# Patient Record
Sex: Male | Born: 1945 | Race: Black or African American | Hispanic: No | Marital: Single | Smoking: Current every day smoker
Health system: Southern US, Community
[De-identification: ages and names within clinical notes are randomized; demographics above are authoritative.]

## PROBLEM LIST (undated history)

## (undated) DIAGNOSIS — I714 Abdominal aortic aneurysm, without rupture, unspecified: Secondary | ICD-10-CM

## (undated) DIAGNOSIS — R7881 Bacteremia: Secondary | ICD-10-CM

## (undated) DIAGNOSIS — I10 Essential (primary) hypertension: Secondary | ICD-10-CM

## (undated) DIAGNOSIS — E7800 Pure hypercholesterolemia, unspecified: Secondary | ICD-10-CM

## (undated) DIAGNOSIS — J9819 Other pulmonary collapse: Secondary | ICD-10-CM

## (undated) HISTORY — PX: PLEURAL SCARIFICATION: SHX748

## (undated) HISTORY — PX: HERNIA REPAIR: SHX51

---

## 2004-10-19 ENCOUNTER — Inpatient Hospital Stay (HOSPITAL_COMMUNITY): Admission: EM | Admit: 2004-10-19 | Discharge: 2004-10-26 | Payer: Self-pay | Admitting: Emergency Medicine

## 2004-10-21 ENCOUNTER — Ambulatory Visit: Payer: Self-pay | Admitting: Internal Medicine

## 2004-10-25 ENCOUNTER — Encounter: Payer: Self-pay | Admitting: Cardiology

## 2004-10-25 ENCOUNTER — Ambulatory Visit: Payer: Self-pay | Admitting: Cardiology

## 2009-06-03 ENCOUNTER — Emergency Department (HOSPITAL_COMMUNITY): Admission: EM | Admit: 2009-06-03 | Discharge: 2009-06-03 | Payer: Self-pay | Admitting: Emergency Medicine

## 2010-05-20 ENCOUNTER — Encounter (HOSPITAL_COMMUNITY): Admission: RE | Admit: 2010-05-20 | Payer: Self-pay | Source: Home / Self Care | Admitting: Oncology

## 2010-05-20 ENCOUNTER — Ambulatory Visit (HOSPITAL_COMMUNITY): Payer: Self-pay | Admitting: Oncology

## 2010-06-23 ENCOUNTER — Encounter: Payer: Self-pay | Admitting: Family Medicine

## 2010-10-18 NOTE — Op Note (Signed)
NAME:  Charles [name], Charles [name] NO.:  [id number]   MEDICAL RECORD NO.:  [id number]          PATIENT TYPE:  INP   LOCATION:  5018                         FACILITY:  [facility]   PHYSICIAN:  Almedia Balls. Ranell Patrick, M.D. DATE OF BIRTH:  08-07-1945   DATE OF PROCEDURE:  10/21/2004  DATE OF DISCHARGE:                                 OPERATIVE REPORT   PREOPERATIVE DIAGNOSIS:  Right grade 2 open tib/fib fracture, segmental and  comminuted.   POSTOPERATIVE DIAGNOSIS:  Right grade 2 open tib/fib fracture, segmental and  comminuted.   PROCEDURE PERFORMED:  Second look irrigation and debridement of right open  tib/fib fracture with application of long leg cast under fluoroscopy.   SURGEON:  Almedia Balls. Ranell Patrick, M.D.   ASSISTANT:  None.   ANESTHESIA:  General.   ESTIMATED BLOOD LOSS:  Less than 100 mL.   FLUIDS REPLACED:  800 mL crystalloid.   COUNTS:  Correct.   COMPLICATIONS:  None.   INDICATIONS FOR PROCEDURE:  The patient is a 65 year old male who sustained  a grade 2 open tibia and fibular fracture to his right leg while riding a  horse.  The horse came up against a large pole.  The patient complained of  immediate pain and deformity with an obvious open tibial fracture and  presented to the emergency room with radiographs demonstrating a comminuted  segmental tib/fib fracture with a large 10 cm proximal laceration.  The  patient presented initially for primary irrigation, debridement, and  application of long leg splint.  The patient now comes back today for a  second look 48 hour wash out of the wound, delayed primary closure, and  fluoroscopic reduction of the fracture and application of a long leg cast.  Informed consent was obtained.   DESCRIPTION OF PROCEDURE:  After an adequate level of anesthesia was  achieved, the patient was positioned supine on the radiolucent table.  The  leg was sterilely prepped and draped in the usual manner.  We again  thoroughly irrigated and  debrided the 10 cm proximal laceration to the  medial side of the tibial tubercle.  We did that with a pulse irrigator with  3 liters normal saline irrigation.  The wound looked clean.  There was no  signs of foreign material in the wound.  At this point, we loosely closed in  a delayed primary fashion the wound using a single layer of vertical  mattress in near-far and far-near nylon suture.  At this point, having the  wound closed, we placed a sterile dressing and then applied a long leg cast  in stages.  This was a fiberglass cast.  Initially crossing the knee, the  fracture had to be in full extension in order to maintain a reduction.  When  we tried to flex it, the fracture would displace with a flexion deformity at  the fracture site.  Thus, the knee was nearly in full extension when we did  this reduction and applied the initial cast.  Once the fracture position was  verified in the cast, we completed the long leg cast reimaging afterwards  and it looked excellent with alignment on the AP and lateral planes looking  close to  anatomic.  At this point, we marked fluoroscopically the future window for  the wound inspection and then left the operating theatre having awakened him  from general anesthesia, he was in stable condition and taken to the  recovery room.      SRN/MEDQ  D:  10/21/2004  T:  10/21/2004  Job:  [id number]

## 2010-10-18 NOTE — Discharge Summary (Signed)
NAME:  Charles [name], Charles [name] NO.:  [id number]   MEDICAL RECORD NO.:  [id number]          PATIENT TYPE:  INP   LOCATION:  5018                         FACILITY:  [facility]   PHYSICIAN:  Almedia Balls. Ranell Patrick, M.D. DATE OF BIRTH:  [birth year]   DATE OF ADMISSION:  10/19/2004  DATE OF DISCHARGE:  10/26/2004                                 DISCHARGE SUMMARY   ADMITTING DIAGNOSIS:  Grade 2 open right tibia and fibula fracture.   DISCHARGE DIAGNOSES:  1.  Grade 2 open right tibia and fibula fracture.  2.  Atrial fibrillation, new onset.   PROCEDURES PERFORMED:  1.  Irrigation and debridement of grade 2 open tibiofibular fracture with      closed reduction under C-arm and application of long leg splint,      performed on Oct 19, 2004.  2.  Oct 21, 2004:  Second-look I&D of open tibiofibular fracture with      delayed primary closure of 10 cm wound and fluoroscopic reduction of the      fracture, application of long leg cast, again by Dr. Ranell Patrick.   HISTORY OF PRESENT ILLNESS:  The patient is a 65 year old male who sustained  a grade 2 open tibiofibular fracture on his right leg secondary to horse  injury when he was pinned between a pole and the horse. The patient  presented to the emergency room with an obvious open fracture with exposed  bone. For further details of the patient's past medical history and physical  examination, please see the medical record.   HOSPITAL COURSE:  The patient admitted to orthopedics and taken to surgery  urgently on Oct 19, 2004 for I&D of his open tibiofibular fracture and  placement in a splint. The patient was placed immediately on broad spectrum  IV antibiotics. He was then taken back to surgery on Oct 21, 2004 for a  second-look I&D, delayed primary closure of his open wound, and application  under fluoroscopic of full long leg cast. The patient postoperatively was  noted to have a significant anemia with a hemoglobin of 6.5 which  necessitated  an internal medicine consult. The patient also was noted to  have postoperative atrial fibrillation which was also followed by medicine.  The patient was felt not to be a chronic anticoagulation candidate by  internal medicine, was requested to follow up with the primary care  physician at [facility]. The patient was discharged on Oct 26, 2004 in  stable condition with scheduled follow-up with internal medicine in  [place] as well as orthopedics in [place]. He was discharged in a long  leg cast, nonweightbearing on his right lower extremity, with pain  medications, on a regular diet.       SRN/MEDQ  D:  12/31/2004  T:  01/01/2005  Job:  [id number]

## 2012-09-12 ENCOUNTER — Emergency Department (HOSPITAL_COMMUNITY): Payer: Medicare Other

## 2012-09-12 ENCOUNTER — Emergency Department (HOSPITAL_COMMUNITY)
Admission: EM | Admit: 2012-09-12 | Discharge: 2012-09-12 | Disposition: A | Discharge status: Home / Self Care | Payer: Medicare Other | Attending: Emergency Medicine | Admitting: Emergency Medicine

## 2012-09-12 ENCOUNTER — Encounter (HOSPITAL_COMMUNITY): Payer: Self-pay | Admitting: *Deleted

## 2012-09-12 DIAGNOSIS — W19XXXA Unspecified fall, initial encounter: Secondary | ICD-10-CM

## 2012-09-12 DIAGNOSIS — IMO0002 Reserved for concepts with insufficient information to code with codable children: Secondary | ICD-10-CM | POA: Insufficient documentation

## 2012-09-12 DIAGNOSIS — S3981XA Other specified injuries of abdomen, initial encounter: Secondary | ICD-10-CM | POA: Insufficient documentation

## 2012-09-12 DIAGNOSIS — F17200 Nicotine dependence, unspecified, uncomplicated: Secondary | ICD-10-CM | POA: Insufficient documentation

## 2012-09-12 DIAGNOSIS — M549 Dorsalgia, unspecified: Secondary | ICD-10-CM

## 2012-09-12 DIAGNOSIS — Y9301 Activity, walking, marching and hiking: Secondary | ICD-10-CM | POA: Insufficient documentation

## 2012-09-12 DIAGNOSIS — Y9289 Other specified places as the place of occurrence of the external cause: Secondary | ICD-10-CM | POA: Insufficient documentation

## 2012-09-12 DIAGNOSIS — Z862 Personal history of diseases of the blood and blood-forming organs and certain disorders involving the immune mechanism: Secondary | ICD-10-CM | POA: Insufficient documentation

## 2012-09-12 DIAGNOSIS — Z8639 Personal history of other endocrine, nutritional and metabolic disease: Secondary | ICD-10-CM | POA: Insufficient documentation

## 2012-09-12 DIAGNOSIS — W108XXA Fall (on) (from) other stairs and steps, initial encounter: Secondary | ICD-10-CM | POA: Insufficient documentation

## 2012-09-12 HISTORY — DX: Pure hypercholesterolemia, unspecified: E78.00

## 2012-09-12 MED ORDER — OXYCODONE-ACETAMINOPHEN 5-325 MG PO TABS
1.0000 | ORAL_TABLET | Freq: Once | ORAL | Status: AC
  Administered 2012-09-12: 1 via ORAL
  Filled 2012-09-12: qty 1

## 2012-09-12 MED ORDER — OXYCODONE-ACETAMINOPHEN 5-325 MG PO TABS: 1.0000 | ORAL_TABLET | ORAL | Status: DC | PRN

## 2012-09-12 NOTE — ED Notes (Signed)
Pt states he fell 3 days ago, down 2-3 steps. Pain to lower back and abdomen. NAD.

## 2012-09-12 NOTE — ED Provider Notes (Signed)
History  This chart was scribed for Joya Gaskins, MD, by Candelaria Stagers, ED Scribe. This patient was seen in room APA18/APA18 and the patient's care was started at 8:05 AM   CSN: [id number]  Arrival date & time 09/12/12  0745   First MD Initiated Contact with Patient 09/12/12 0756      Chief Complaint  Patient presents with  . Fall     Patient is a 67 y.o. male presenting with fall. The history is provided by the patient. No language interpreter was used.  Fall The accident occurred more than 2 days ago. The fall occurred while walking. He landed on concrete. There was no blood loss. The point of impact was the head (lower back). Pain location: lumbar spine and lower abdomen. He was ambulatory at the scene. There was no entrapment after the fall. There was no drug use involved in the accident. There was no alcohol use involved in the accident. Associated symptoms include abdominal pain. Pertinent negatives include no numbness, no nausea, no vomiting and no headaches. He has tried NSAIDs for the symptoms. The treatment provided mild relief.   Charles [name] is a 67 y.o. male who presents to the Emergency Department complaining of continued abdominal pain and lower back pain after tripping and falling down 2-3 stairs one week ago falling backwards landing on cement.  Pt reports he held his head and did not hit it but did land on his back and he denies syncope.  He reports minimal pain immediately after the fall and was ambulatory going to work right after the fall.  He has taken ibuprofen with some relief and states that now it is helping less.  He denies headache, neck pain, or leg weakness.  Pt does not take anticoagulants.     Past Medical History  Diagnosis Date  . Hypercholesteremia     Past Surgical History  Procedure Laterality Date  . Hernia repair      No family history on file.  History  Substance Use Topics  . Smoking status: Current Every Day Smoker  .  Smokeless tobacco: Not on file  . Alcohol Use: Yes      Review of Systems  Gastrointestinal: Positive for abdominal pain. Negative for nausea and vomiting.  Neurological: Negative for numbness and headaches.  All other systems reviewed and are negative.    Allergies  Review of patient's allergies indicates no known allergies.  Home Medications  No current outpatient prescriptions on file.  BP 156/73  Pulse 47  Temp(Src) 98.3 F (36.8 C) (Oral)  Resp 16  Ht 5\' 4"  (1.626 m)  Wt 130 lb (58.968 kg)  BMI 22.3 kg/m2  SpO2 98%  Physical Exam CONSTITUTIONAL: Well developed/well nourished HEAD: Normocephalic/atraumatic EYES: EOMI/PERRL ENMT: Mucous membranes moist, no facial trauma, poor dentition NECK: supple no meningeal signs SPINE: lower lumbar tenderness, No bruising/crepitance/stepoffs noted to spine, NEXUS criteria met CV: S1/S2 noted, no murmurs/rubs/gallops noted LUNGS: Lungs are clear to auscultation bilaterally, no apparent distress CHEST: no chest wall tenderness ABDOMEN: soft, nontender, no rebound or guarding, no bruising noted to his abdomen GU:no cva tenderness NEURO: Pt is awake/alert, moves all extremitiesx4 EXTREMITIES: pulses normal, full ROM, no deformity, mild tenderness to right hip, pelvis stable and he has full ROM of both hips, All other extremities/joints palpated/ranged and nontender SKIN: warm, color normal PSYCH: no abnormalities of mood noted  ED Course  Procedures   DIAGNOSTIC STUDIES: Oxygen Saturation is 98% on room air, normal by my  interpretation.    COORDINATION OF CARE:  8:10 AM Discussed course of care with pt which includes xray of lumbar spine and hip.  Pt understands and agrees.   9:27 AM Pt improved He is ambulatory without issue I doubt occult head/chest/abdominal injury from fall given it was over a week ago Denies syncope, reports mechanical fall Stable for d/c  Labs Reviewed - No data to display Dg Lumbar Spine  Complete  09/12/2012  *RADIOLOGY REPORT*  Clinical Data: Fall 1 week ago, persistent low back pain.  LUMBAR SPINE - COMPLETE 4+ VIEW  Comparison: None.  Findings: Five non-rib bearing lumbar vertebrae with anatomic alignment.  Straightening of the usual lordosis.  No fractures. Disc space narrowing endplate hypertrophic changes at L4-5, moderate in degree.  Remaining disc spaces well preserved.  No pars defects.  Facet degenerative changes diffusely, worst at L4-5 and five S1.  Visualized sacroiliac joints intact.  Calcified aorto- iliac plaque without evidence of aneurysm.  IMPRESSION: No acute osseous abnormality.  Straightening of the usual lordosis which may be due to positioning and/or spasm.  Moderate degenerative disc disease at L4-5.   Original Report Authenticated By: Hulan Saas, M.D.    Dg Pelvis 1-2 Views  09/12/2012  *RADIOLOGY REPORT*  Clinical Data: Larey Seat 1 week ago, persistent pain.  PELVIS - 1-2 VIEW  Comparison: None.  Findings: No acute or subacute fractures involving the pelvis. Sacroiliac joints symphysis pubis intact.  Moderate to severe symmetric joint space narrowing in both hips.  Calcified iliofemoral plaque without aneurysm.  IMPRESSION: No acute or subacute osseous abnormality.  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   Original Report Authenticated By: Hulan Saas, M.D.        MDM  Nursing notes including past medical history and social history reviewed and considered in documentation xrays reviewed and considered   I personally performed the services described in this documentation, which was scribed in my presence. The recorded information has been reviewed and is accurate.          Joya Gaskins, MD 09/12/12 [phone]

## 2012-09-14 ENCOUNTER — Emergency Department (HOSPITAL_COMMUNITY)
Admission: EM | Admit: 2012-09-14 | Discharge: 2012-09-14 | Discharge status: Against Medical Advice | Payer: Medicare Other | Attending: Emergency Medicine | Admitting: Emergency Medicine

## 2012-09-14 ENCOUNTER — Encounter (HOSPITAL_COMMUNITY): Payer: Self-pay | Admitting: *Deleted

## 2012-09-14 DIAGNOSIS — Z760 Encounter for issue of repeat prescription: Secondary | ICD-10-CM | POA: Insufficient documentation

## 2012-09-14 DIAGNOSIS — Z87828 Personal history of other (healed) physical injury and trauma: Secondary | ICD-10-CM | POA: Insufficient documentation

## 2012-09-14 DIAGNOSIS — F17200 Nicotine dependence, unspecified, uncomplicated: Secondary | ICD-10-CM | POA: Insufficient documentation

## 2012-09-14 NOTE — ED Notes (Signed)
Pt left stating he does not want to wait to see edp for rx refill.  States, " I just want my medicine refilled.  I'll go see my other doctor."

## 2012-09-14 NOTE — ED Notes (Signed)
Pt was seen in er 09/12/2012 for fall, diagnosed with back pain, was given pain prescription, states that he has ran out of pain medication, presents to er today for refill, states that the back pain is getting better,

## 2012-09-15 ENCOUNTER — Emergency Department (HOSPITAL_COMMUNITY)
Admission: EM | Admit: 2012-09-15 | Discharge: 2012-09-15 | Disposition: A | Discharge status: Home / Self Care | Payer: Medicare Other | Attending: Emergency Medicine | Admitting: Emergency Medicine

## 2012-09-15 ENCOUNTER — Encounter (HOSPITAL_COMMUNITY): Payer: Self-pay

## 2012-09-15 DIAGNOSIS — M549 Dorsalgia, unspecified: Secondary | ICD-10-CM

## 2012-09-15 DIAGNOSIS — M545 Low back pain, unspecified: Secondary | ICD-10-CM | POA: Insufficient documentation

## 2012-09-15 DIAGNOSIS — F17200 Nicotine dependence, unspecified, uncomplicated: Secondary | ICD-10-CM | POA: Insufficient documentation

## 2012-09-15 DIAGNOSIS — Z862 Personal history of diseases of the blood and blood-forming organs and certain disorders involving the immune mechanism: Secondary | ICD-10-CM | POA: Insufficient documentation

## 2012-09-15 DIAGNOSIS — Z8639 Personal history of other endocrine, nutritional and metabolic disease: Secondary | ICD-10-CM | POA: Insufficient documentation

## 2012-09-15 DIAGNOSIS — Z87828 Personal history of other (healed) physical injury and trauma: Secondary | ICD-10-CM | POA: Insufficient documentation

## 2012-09-15 MED ORDER — OXYCODONE-ACETAMINOPHEN 5-325 MG PO TABS
1.0000 | ORAL_TABLET | Freq: Once | ORAL | Status: AC
  Administered 2012-09-15: 1 via ORAL
  Filled 2012-09-15: qty 1

## 2012-09-15 MED ORDER — OXYCODONE-ACETAMINOPHEN 5-325 MG PO TABS: 1.0000 | ORAL_TABLET | Freq: Four times a day (QID) | ORAL | Status: DC | PRN

## 2012-09-15 NOTE — ED Provider Notes (Signed)
History    This chart was scribed for Charles Lennert, MD by Marlyne Beards, ED Scribe. The patient was seen in room APFT23/APFT23. Patient's care was started at 3:27 PM.    CSN: [id number]  Arrival date & time 09/15/12  1501   First MD Initiated Contact with Patient 09/15/12 1527      Chief Complaint  Patient presents with  . Back Pain    (Consider location/radiation/quality/duration/timing/severity/associated sxs/prior treatment) Patient is a 67 y.o. male presenting with back pain. The history is provided by the patient. No language interpreter was used.  Back Pain Location:  Lumbar spine Pain severity:  Moderate Duration:  4 weeks Timing:  Constant Progression:  Worsening Context: falling   Relieved by: oxycodone. Associated symptoms: no abdominal pain, no chest pain and no headaches    Charles [name] is a 67 y.o. male who presents to the Emergency Department complaining of moderate constant lower back pain for the past 3-4 weeks. Pt was seen in the ED last Sunday and was prescribed oxycodone for the pain. Pt denies that pain radiates but pain is exacerbated with movement. Pt states he had a previous injury of falling on the floor landing on his back. Pt denies any bladder/bowel incontinence, fever, chills, cough, nausea, vomiting, diarrhea, SOB, weakness, and any other associated symptoms. Pt's PCP is Dr. Phillips Odor. Pt is a current everyday tobacco smoker.   Past Medical History  Diagnosis Date  . Hypercholesteremia     Past Surgical History  Procedure Laterality Date  . Hernia repair      No family history on file.  History  Substance Use Topics  . Smoking status: Current Every Day Smoker  . Smokeless tobacco: Not on file  . Alcohol Use: Yes     Comment: former      Review of Systems  Constitutional: Negative for appetite change and fatigue.  HENT: Negative for congestion, sinus pressure and ear discharge.   Eyes: Negative for discharge.  Respiratory:  Negative for cough.   Cardiovascular: Negative for chest pain.  Gastrointestinal: Negative for abdominal pain and diarrhea.  Genitourinary: Negative for frequency and hematuria.  Musculoskeletal: Positive for back pain.  Skin: Negative for rash.  Neurological: Negative for seizures and headaches.  Psychiatric/Behavioral: Negative for hallucinations.    Allergies  Review of patient's allergies indicates no known allergies.  Home Medications   Current Outpatient Rx  Name  Route  Sig  Dispense  Refill  . ibuprofen (ADVIL,MOTRIN) 200 MG tablet   Oral   Take 800 mg by mouth every 6 (six) hours as needed for pain.         Marland Kitchen oxyCODONE-acetaminophen (PERCOCET/ROXICET) 5-325 MG per tablet   Oral   Take 1 tablet by mouth every 4 (four) hours as needed for pain.   10 tablet   0     BP 152/80  Pulse 96  Temp(Src) 97.1 F (36.2 C) (Oral)  Resp 18  Ht 5\' 4"  (1.626 m)  Wt 130 lb (58.968 kg)  BMI 22.3 kg/m2  SpO2 100%  Physical Exam  Nursing note and vitals reviewed. Constitutional: He is oriented to person, place, and time. He appears well-developed.  HENT:  Head: Normocephalic.  Eyes: Conjunctivae and EOM are normal. No scleral icterus.  Neck: Neck supple. No tracheal deviation present. No thyromegaly present.  Cardiovascular: Normal rate and regular rhythm.  Exam reveals no gallop and no friction rub.   No murmur heard. Pulmonary/Chest: No stridor. He has no wheezes. He  has no rales. He exhibits no tenderness.  Abdominal: He exhibits no distension. There is no tenderness. There is no rebound.  Musculoskeletal: Normal range of motion. He exhibits tenderness. He exhibits no edema.  Tender upon palpation to the lumbar spine region mildly.  Lymphadenopathy:    He has no cervical adenopathy.  Neurological: He is oriented to person, place, and time. Coordination normal.  Skin: Skin is warm. No rash noted. No erythema.  Psychiatric: He has a normal mood and affect. His behavior  is normal.    ED Course  Procedures (including critical care time) DIAGNOSTIC STUDIES: Oxygen Saturation is 100% on room air, normal by my interpretation.    COORDINATION OF CARE: 3:33 PM Discussed ED treatment with pt and pt agrees.     Labs Reviewed - No data to display No results found.   No diagnosis found.    MDM   The chart was scribed for me under my direct supervision.  I personally performed the history, physical, and medical decision making and all procedures in the evaluation of this patient.Charles Lennert, MD 09/15/12 [phone]

## 2012-09-15 NOTE — ED Notes (Signed)
Pt c/o lower back pain for past 3 or 4 weeks.  Reports was evaluated here last Sunday and  has been taking oxycodone for pain.

## 2012-09-16 ENCOUNTER — Encounter (HOSPITAL_COMMUNITY): Payer: Self-pay | Admitting: *Deleted

## 2012-09-16 ENCOUNTER — Emergency Department (HOSPITAL_COMMUNITY): Payer: Medicare Other

## 2012-09-16 ENCOUNTER — Emergency Department (HOSPITAL_COMMUNITY)
Admission: EM | Admit: 2012-09-16 | Discharge: 2012-09-16 | Disposition: A | Discharge status: Home / Self Care | Payer: Medicare Other | Attending: Emergency Medicine | Admitting: Emergency Medicine

## 2012-09-16 DIAGNOSIS — M79652 Pain in left thigh: Secondary | ICD-10-CM

## 2012-09-16 DIAGNOSIS — Z8639 Personal history of other endocrine, nutritional and metabolic disease: Secondary | ICD-10-CM | POA: Insufficient documentation

## 2012-09-16 DIAGNOSIS — M79609 Pain in unspecified limb: Secondary | ICD-10-CM | POA: Insufficient documentation

## 2012-09-16 DIAGNOSIS — Z862 Personal history of diseases of the blood and blood-forming organs and certain disorders involving the immune mechanism: Secondary | ICD-10-CM | POA: Insufficient documentation

## 2012-09-16 DIAGNOSIS — F17200 Nicotine dependence, unspecified, uncomplicated: Secondary | ICD-10-CM | POA: Insufficient documentation

## 2012-09-16 DIAGNOSIS — G8911 Acute pain due to trauma: Secondary | ICD-10-CM | POA: Insufficient documentation

## 2012-09-16 NOTE — ED Provider Notes (Signed)
History    This chart was scribed for Donnetta Hutching, MD by Donne Anon, ED Scribe. This patient was seen in room APA06/APA06 and the patient's care was started at 1507.   CSN: [id number]  Arrival date & time 09/16/12  1303   First MD Initiated Contact with Patient 09/16/12 1507      Chief Complaint  Patient presents with  . Groin Pain   Level 5 caveat for mental health issues  The history is provided by the patient. The history is limited by the condition of the patient. No language interpreter was used.   Charles [name] is a 67 y.o. male who presents to the Emergency Department complaining of gradual onset, moderate medial aspect of left thigh pain which began 5 weeks ago. He was seen in the ED yesterday and was prescribed Percocet because he hurt his back. He denies any recent injury to his left leg. He currently lives with his nephew. He states the pain is so bad he cannot walk.   Past Medical History  Diagnosis Date  . Hypercholesteremia     Past Surgical History  Procedure Laterality Date  . Hernia repair    . Pleural scarification      History reviewed. No pertinent family history.  History  Substance Use Topics  . Smoking status: Current Every Day Smoker    Types: Cigarettes  . Smokeless tobacco: Not on file  . Alcohol Use: Yes     Comment: former      Review of Systems  Unable to perform ROS: Psychiatric disorder    Allergies  Review of patient's allergies indicates no known allergies.  Home Medications   Current Outpatient Rx  Name  Route  Sig  Dispense  Refill  . oxyCODONE-acetaminophen (PERCOCET/ROXICET) 5-325 MG per tablet   Oral   Take 1 tablet by mouth every 6 (six) hours as needed for pain.   30 tablet   0     BP 159/92  Pulse 109  Temp(Src) 100.4 F (38 C) (Oral)  Resp 20  Ht 5\' 4"  (1.626 m)  Wt 130 lb (58.968 kg)  BMI 22.3 kg/m2  SpO2 100%  Physical Exam  Nursing note and vitals reviewed. Constitutional: He appears  well-developed and well-nourished.  HENT:  Head: Normocephalic and atraumatic.  Eyes: Conjunctivae and EOM are normal. Pupils are equal, round, and reactive to light.  Neck: Normal range of motion. Neck supple.  Cardiovascular: Normal rate, regular rhythm and normal heart sounds.   Pulmonary/Chest: Effort normal and breath sounds normal.  Abdominal: Soft. Bowel sounds are normal.  Musculoskeletal:  Its proximal medial thigh.  No posterior thigh or calf tenderness  Neurological: He is alert.  Skin: Skin is warm and dry.  Psychiatric: He has a normal mood and affect.    ED Course  Procedures (including critical care time) DIAGNOSTIC STUDIES: Oxygen Saturation is 100% on room air, normal by my interpretation.    COORDINATION OF CARE: 3:32 PM Discussed treatment plan which includes reviewing the pts chart and an xray of the left thigh with pt at bedside and pt agreed to plan.    Dg Femur Left  09/16/2012  *RADIOLOGY REPORT*  Clinical Data: Left groin pain after falling several weeks ago.  LEFT FEMUR - 2 VIEW  Comparison: One-view pelvis 09/12/2012.  Findings: The mineralization and alignment are normal.  There is no evidence of acute fracture or dislocation.  There is mild left hip and moderate left knee joint space loss.  Diffuse vascular calcifications are noted.  IMPRESSION: No acute osseous findings identified.  Diffuse vascular calcifications are noted.   Original Report Authenticated By: Carey Bullocks, M.D.     Labs Reviewed - No data to display No results found.   No diagnosis found.    MDM  No posterior calf or thigh tenderness to suggest DVT. Physical exam of the affected extremity normal.  X-ray negative of left femur  I personally performed the services described in this documentation, which was scribed in my presence. The recorded information has been reviewed and is accurate.        Donnetta Hutching, MD 09/16/12 [phone]

## 2012-09-16 NOTE — ED Notes (Addendum)
Pain lt groin,   Seen here 4/13 for same. Says the meds he was given do not help  Says pain goes down his lt leg.  Decreased urination.  No N/V,    Says he is constipated

## 2012-09-21 ENCOUNTER — Inpatient Hospital Stay (HOSPITAL_COMMUNITY)
Admission: EM | Admit: 2012-09-21 | Discharge: 2012-10-08 | DRG: 237 | Disposition: A | Discharge status: Other Acute Inpatient Hospital | Payer: Medicare Other | Attending: Vascular Surgery | Admitting: Vascular Surgery

## 2012-09-21 ENCOUNTER — Inpatient Hospital Stay (HOSPITAL_COMMUNITY): Payer: Medicare Other

## 2012-09-21 ENCOUNTER — Inpatient Hospital Stay (HOSPITAL_COMMUNITY): Payer: Medicare Other | Admitting: Anesthesiology

## 2012-09-21 ENCOUNTER — Emergency Department (HOSPITAL_COMMUNITY): Payer: Medicare Other

## 2012-09-21 ENCOUNTER — Encounter (HOSPITAL_COMMUNITY): Payer: Self-pay | Admitting: Anesthesiology

## 2012-09-21 ENCOUNTER — Encounter (HOSPITAL_COMMUNITY)
Admission: EM | Disposition: A | Discharge status: Other Acute Inpatient Hospital | Payer: Self-pay | Source: Home / Self Care | Attending: Vascular Surgery

## 2012-09-21 ENCOUNTER — Encounter (HOSPITAL_COMMUNITY): Payer: Self-pay | Admitting: Emergency Medicine

## 2012-09-21 DIAGNOSIS — T8119XA Other postprocedural shock, initial encounter: Secondary | ICD-10-CM | POA: Diagnosis not present

## 2012-09-21 DIAGNOSIS — A419 Sepsis, unspecified organism: Secondary | ICD-10-CM | POA: Diagnosis not present

## 2012-09-21 DIAGNOSIS — Z681 Body mass index (BMI) 19 or less, adult: Secondary | ICD-10-CM

## 2012-09-21 DIAGNOSIS — D62 Acute posthemorrhagic anemia: Secondary | ICD-10-CM | POA: Diagnosis not present

## 2012-09-21 DIAGNOSIS — IMO0002 Reserved for concepts with insufficient information to code with codable children: Secondary | ICD-10-CM | POA: Diagnosis not present

## 2012-09-21 DIAGNOSIS — A029 Salmonella infection, unspecified: Secondary | ICD-10-CM | POA: Diagnosis not present

## 2012-09-21 DIAGNOSIS — D696 Thrombocytopenia, unspecified: Secondary | ICD-10-CM | POA: Diagnosis not present

## 2012-09-21 DIAGNOSIS — R410 Disorientation, unspecified: Secondary | ICD-10-CM

## 2012-09-21 DIAGNOSIS — R17 Unspecified jaundice: Secondary | ICD-10-CM | POA: Diagnosis not present

## 2012-09-21 DIAGNOSIS — I959 Hypotension, unspecified: Secondary | ICD-10-CM | POA: Diagnosis not present

## 2012-09-21 DIAGNOSIS — J9600 Acute respiratory failure, unspecified whether with hypoxia or hypercapnia: Secondary | ICD-10-CM | POA: Diagnosis not present

## 2012-09-21 DIAGNOSIS — K929 Disease of digestive system, unspecified: Secondary | ICD-10-CM | POA: Diagnosis not present

## 2012-09-21 DIAGNOSIS — R7309 Other abnormal glucose: Secondary | ICD-10-CM | POA: Diagnosis not present

## 2012-09-21 DIAGNOSIS — I4891 Unspecified atrial fibrillation: Secondary | ICD-10-CM | POA: Diagnosis present

## 2012-09-21 DIAGNOSIS — J438 Other emphysema: Secondary | ICD-10-CM | POA: Diagnosis present

## 2012-09-21 DIAGNOSIS — E860 Dehydration: Secondary | ICD-10-CM | POA: Diagnosis present

## 2012-09-21 DIAGNOSIS — Z9911 Dependence on respirator [ventilator] status: Secondary | ICD-10-CM

## 2012-09-21 DIAGNOSIS — I713 Abdominal aortic aneurysm, ruptured, unspecified: Principal | ICD-10-CM | POA: Diagnosis present

## 2012-09-21 DIAGNOSIS — E876 Hypokalemia: Secondary | ICD-10-CM | POA: Diagnosis not present

## 2012-09-21 DIAGNOSIS — B9689 Other specified bacterial agents as the cause of diseases classified elsewhere: Secondary | ICD-10-CM | POA: Diagnosis not present

## 2012-09-21 DIAGNOSIS — J9589 Other postprocedural complications and disorders of respiratory system, not elsewhere classified: Secondary | ICD-10-CM | POA: Diagnosis not present

## 2012-09-21 DIAGNOSIS — E7800 Pure hypercholesterolemia, unspecified: Secondary | ICD-10-CM | POA: Diagnosis present

## 2012-09-21 DIAGNOSIS — D72829 Elevated white blood cell count, unspecified: Secondary | ICD-10-CM | POA: Diagnosis not present

## 2012-09-21 DIAGNOSIS — F1010 Alcohol abuse, uncomplicated: Secondary | ICD-10-CM | POA: Diagnosis present

## 2012-09-21 DIAGNOSIS — E873 Alkalosis: Secondary | ICD-10-CM | POA: Diagnosis not present

## 2012-09-21 DIAGNOSIS — R531 Weakness: Secondary | ICD-10-CM | POA: Diagnosis present

## 2012-09-21 DIAGNOSIS — R6521 Severe sepsis with septic shock: Secondary | ICD-10-CM | POA: Diagnosis not present

## 2012-09-21 DIAGNOSIS — J95821 Acute postprocedural respiratory failure: Secondary | ICD-10-CM | POA: Diagnosis not present

## 2012-09-21 DIAGNOSIS — E871 Hypo-osmolality and hyponatremia: Secondary | ICD-10-CM

## 2012-09-21 DIAGNOSIS — D689 Coagulation defect, unspecified: Secondary | ICD-10-CM | POA: Diagnosis not present

## 2012-09-21 DIAGNOSIS — N390 Urinary tract infection, site not specified: Secondary | ICD-10-CM | POA: Diagnosis not present

## 2012-09-21 DIAGNOSIS — R109 Unspecified abdominal pain: Secondary | ICD-10-CM

## 2012-09-21 DIAGNOSIS — K560 Paralytic ileus: Secondary | ICD-10-CM | POA: Diagnosis not present

## 2012-09-21 DIAGNOSIS — I119 Hypertensive heart disease without heart failure: Secondary | ICD-10-CM | POA: Diagnosis present

## 2012-09-21 DIAGNOSIS — R5383 Other fatigue: Secondary | ICD-10-CM

## 2012-09-21 DIAGNOSIS — R627 Adult failure to thrive: Secondary | ICD-10-CM | POA: Diagnosis present

## 2012-09-21 DIAGNOSIS — F22 Delusional disorders: Secondary | ICD-10-CM | POA: Diagnosis not present

## 2012-09-21 DIAGNOSIS — E43 Unspecified severe protein-calorie malnutrition: Secondary | ICD-10-CM | POA: Diagnosis present

## 2012-09-21 DIAGNOSIS — R5381 Other malaise: Secondary | ICD-10-CM | POA: Diagnosis present

## 2012-09-21 DIAGNOSIS — R7881 Bacteremia: Secondary | ICD-10-CM | POA: Diagnosis not present

## 2012-09-21 DIAGNOSIS — E8809 Other disorders of plasma-protein metabolism, not elsewhere classified: Secondary | ICD-10-CM

## 2012-09-21 DIAGNOSIS — F17200 Nicotine dependence, unspecified, uncomplicated: Secondary | ICD-10-CM | POA: Diagnosis present

## 2012-09-21 DIAGNOSIS — J95851 Ventilator associated pneumonia: Secondary | ICD-10-CM | POA: Diagnosis not present

## 2012-09-21 DIAGNOSIS — T827XXA Infection and inflammatory reaction due to other cardiac and vascular devices, implants and grafts, initial encounter: Secondary | ICD-10-CM

## 2012-09-21 DIAGNOSIS — I2789 Other specified pulmonary heart diseases: Secondary | ICD-10-CM | POA: Diagnosis present

## 2012-09-21 DIAGNOSIS — G9341 Metabolic encephalopathy: Secondary | ICD-10-CM | POA: Diagnosis not present

## 2012-09-21 DIAGNOSIS — F411 Generalized anxiety disorder: Secondary | ICD-10-CM | POA: Diagnosis not present

## 2012-09-21 HISTORY — PX: ABDOMINAL AORTIC ANEURYSM REPAIR: SHX42

## 2012-09-21 HISTORY — PX: EMBOLECTOMY: SHX44

## 2012-09-21 HISTORY — PX: PATCH ANGIOPLASTY: SHX6230

## 2012-09-21 HISTORY — DX: Other pulmonary collapse: J98.19

## 2012-09-21 LAB — URINALYSIS, ROUTINE W REFLEX MICROSCOPIC
Nitrite: NEGATIVE
Specific Gravity, Urine: 1.01 (ref 1.005–1.030)
Urobilinogen, UA: 4 mg/dL — ABNORMAL HIGH (ref 0.0–1.0)
pH: 6.5 (ref 5.0–8.0)

## 2012-09-21 LAB — CBC WITH DIFFERENTIAL/PLATELET
Basophils Relative: 0 % (ref 0–1)
Eosinophils Absolute: 0 10*3/uL (ref 0.0–0.7)
Hemoglobin: 11.3 g/dL — ABNORMAL LOW (ref 13.0–17.0)
Lymphs Abs: 0.9 10*3/uL (ref 0.7–4.0)
Platelets: 148 10*3/uL — ABNORMAL LOW (ref 150–400)
RDW: 12.2 % (ref 11.5–15.5)
WBC: 9 10*3/uL (ref 4.0–10.5)

## 2012-09-21 LAB — URINE MICROSCOPIC-ADD ON

## 2012-09-21 LAB — COMPREHENSIVE METABOLIC PANEL
AST: 75 U/L — ABNORMAL HIGH (ref 0–37)
Albumin: 2.4 g/dL — ABNORMAL LOW (ref 3.5–5.2)
Alkaline Phosphatase: 70 U/L (ref 39–117)
BUN: 15 mg/dL (ref 6–23)
CO2: 31 mEq/L (ref 19–32)
Chloride: 82 mEq/L — ABNORMAL LOW (ref 96–112)
Creatinine, Ser: 0.55 mg/dL (ref 0.50–1.35)
Creatinine, Ser: 0.44 mg/dL — ABNORMAL LOW (ref 0.50–1.35)
GFR calc non Af Amer: >90 mL/min (ref >90)
Potassium: 3.8 mEq/L (ref 3.5–5.1)
Potassium: 3.5 mEq/L (ref 3.5–5.1)
Sodium: 121 mEq/L — ABNORMAL LOW (ref 135–145)
Total Bilirubin: 0.9 mg/dL (ref 0.3–1.2)

## 2012-09-21 LAB — RAPID URINE DRUG SCREEN, HOSP PERFORMED
Cocaine: NOT DETECTED
Opiates: NOT DETECTED
Tetrahydrocannabinol: POSITIVE — AB

## 2012-09-21 LAB — CBC
MCV: 92.7 fL (ref 78.0–100.0)
Platelets: 150 10*3/uL (ref 150–400)
RDW: 12.1 % (ref 11.5–15.5)
WBC: 12.9 10*3/uL — ABNORMAL HIGH (ref 4.0–10.5)

## 2012-09-21 LAB — MRSA PCR SCREENING: MRSA by PCR: NEGATIVE

## 2012-09-21 SURGERY — ANEURYSM ABDOMINAL AORTIC REPAIR
Anesthesia: General | Site: Leg Lower | Laterality: Right

## 2012-09-21 MED ORDER — ACETAMINOPHEN 325 MG PO TABS
650.0000 mg | ORAL_TABLET | Freq: Four times a day (QID) | ORAL | Status: DC | PRN
  Administered 2012-09-21: 650 mg via ORAL
  Filled 2012-09-21: qty 2

## 2012-09-21 MED ORDER — SODIUM CHLORIDE 0.9 % IV SOLN
INTRAVENOUS | Status: DC
  Administered 2012-09-21 – 2012-09-22 (×7): via INTRAVENOUS

## 2012-09-21 MED ORDER — CEFAZOLIN SODIUM-DEXTROSE 2-3 GM-% IV SOLR
2.0000 g | INTRAVENOUS | Status: DC
  Filled 2012-09-21: qty 50

## 2012-09-21 MED ORDER — MORPHINE SULFATE 2 MG/ML IJ SOLN
2.0000 mg | Freq: Once | INTRAMUSCULAR | Status: AC
  Administered 2012-09-21: 2 mg via INTRAVENOUS
  Filled 2012-09-21: qty 1

## 2012-09-21 MED ORDER — DILTIAZEM HCL 100 MG IV SOLR
5.0000 mg/h | INTRAVENOUS | Status: DC
  Administered 2012-09-21: 10 mg/h via INTRAVENOUS
  Administered 2012-09-21: 5 mg/h via INTRAVENOUS
  Administered 2012-09-21: 15 mg/h via INTRAVENOUS
  Filled 2012-09-21: qty 100

## 2012-09-21 MED ORDER — HYDROCODONE-ACETAMINOPHEN 5-325 MG PO TABS
1.0000 | ORAL_TABLET | Freq: Four times a day (QID) | ORAL | Status: DC | PRN
  Administered 2012-09-21 (×2): 1 via ORAL
  Filled 2012-09-21 (×2): qty 1

## 2012-09-21 MED ORDER — IOHEXOL 300 MG/ML  SOLN
50.0000 mL | Freq: Once | INTRAMUSCULAR | Status: AC | PRN
  Administered 2012-09-21: 50 mL via ORAL

## 2012-09-21 MED ORDER — MORPHINE SULFATE 2 MG/ML IJ SOLN: 2.0000 mg | INTRAMUSCULAR | Status: DC | PRN

## 2012-09-21 MED ORDER — ALUM & MAG HYDROXIDE-SIMETH 200-200-20 MG/5ML PO SUSP: 30.0000 mL | Freq: Four times a day (QID) | ORAL | Status: DC | PRN

## 2012-09-21 MED ORDER — SODIUM CHLORIDE 0.9 % IJ SOLN: 3.0000 mL | Freq: Two times a day (BID) | INTRAMUSCULAR | Status: DC

## 2012-09-21 MED ORDER — CEFTRIAXONE SODIUM 1 G IJ SOLR
1.0000 g | INTRAMUSCULAR | Status: DC
  Administered 2012-09-21: 1 g via INTRAVENOUS
  Filled 2012-09-21 (×2): qty 10

## 2012-09-21 MED ORDER — IOHEXOL 300 MG/ML  SOLN
100.0000 mL | Freq: Once | INTRAMUSCULAR | Status: AC | PRN
  Administered 2012-09-21: 100 mL via INTRAVENOUS

## 2012-09-21 MED ORDER — ONDANSETRON HCL 4 MG PO TABS: 4.0000 mg | ORAL_TABLET | Freq: Four times a day (QID) | ORAL | Status: DC | PRN

## 2012-09-21 MED ORDER — ONDANSETRON HCL 4 MG/2ML IJ SOLN: 4.0000 mg | Freq: Four times a day (QID) | INTRAMUSCULAR | Status: DC | PRN

## 2012-09-21 SURGICAL SUPPLY — 74 items
ATTRACTOMAT 16X20 MAGNETIC DRP (DRAPES) ×4 IMPLANT
BAG ISL DRAPE 18X18 STRL (DRAPES) ×3
BAG ISOLATION DRAPE 18X18 (DRAPES) IMPLANT
CANISTER SUCTION 2500CC (MISCELLANEOUS) ×4 IMPLANT
CANISTER WOUND CARE 500ML ATS (WOUND CARE) ×2 IMPLANT
CANNULA VESSEL W/WING WO/VALVE (CANNULA) ×4 IMPLANT
CATH EMB 4FR 80CM (CATHETERS) ×1 IMPLANT
CLIP TI MEDIUM 24 (CLIP) ×4 IMPLANT
CLIP TI WIDE RED SMALL 24 (CLIP) ×4 IMPLANT
CLOTH BEACON ORANGE TIMEOUT ST (SAFETY) ×4 IMPLANT
COVER SURGICAL LIGHT HANDLE (MISCELLANEOUS) ×4 IMPLANT
DRAPE INCISE IOBAN 66X45 STRL (DRAPES) ×4 IMPLANT
DRAPE ISOLATION BAG 18X18 (DRAPES) ×1
DRAPE WARM FLUID 44X44 (DRAPE) ×4 IMPLANT
DRSG COVADERM 4X6 (GAUZE/BANDAGES/DRESSINGS) ×4 IMPLANT
ELECT BLADE 6.5 EXT (BLADE) IMPLANT
ELECT CAUTERY BLADE 6.4 (BLADE) ×2 IMPLANT
ELECT REM PT RETURN 9FT ADLT (ELECTROSURGICAL) ×4
ELECTRODE REM PT RTRN 9FT ADLT (ELECTROSURGICAL) ×3 IMPLANT
GLOVE BIO SURGEON STRL SZ7.5 (GLOVE) ×4 IMPLANT
GLOVE BIOGEL M 6.5 STRL (GLOVE) ×3 IMPLANT
GLOVE BIOGEL M STRL SZ7.5 (GLOVE) ×4 IMPLANT
GLOVE BIOGEL PI IND STRL 7.0 (GLOVE) ×4 IMPLANT
GLOVE BIOGEL PI IND STRL 7.5 (GLOVE) IMPLANT
GLOVE BIOGEL PI INDICATOR 7.0 (GLOVE) ×4
GLOVE BIOGEL PI INDICATOR 7.5 (GLOVE) ×1
GLOVE SURG SS PI 6.5 STRL IVOR (GLOVE) ×3 IMPLANT
GLOVE SURG SS PI 7.5 STRL IVOR (GLOVE) ×1 IMPLANT
GOWN PREVENTION PLUS XLARGE (GOWN DISPOSABLE) ×6 IMPLANT
GOWN STRL NON-REIN LRG LVL3 (GOWN DISPOSABLE) ×22 IMPLANT
GRAFT HEMASHIELD 16X8MM (Vascular Products) ×3 IMPLANT
INSERT FOGARTY 61MM (MISCELLANEOUS) ×4 IMPLANT
INSERT FOGARTY SM (MISCELLANEOUS) ×8 IMPLANT
KIT BASIN OR (CUSTOM PROCEDURE TRAY) ×4 IMPLANT
KIT DRAINAGE VACCUM ASSIST (KITS) ×1 IMPLANT
KIT ROOM TURNOVER OR (KITS) ×4 IMPLANT
LOOP VESSEL MINI RED (MISCELLANEOUS) IMPLANT
NS IRRIG 1000ML POUR BTL (IV SOLUTION) ×10 IMPLANT
PACK AORTA (CUSTOM PROCEDURE TRAY) ×4 IMPLANT
PACK UNIVERSAL I (CUSTOM PROCEDURE TRAY) ×1 IMPLANT
PAD ARMBOARD 7.5X6 YLW CONV (MISCELLANEOUS) ×8 IMPLANT
PATCH HEMASHIELD 8X75 (Vascular Products) ×3 IMPLANT
RETAINER VISCERA MED (MISCELLANEOUS) ×4 IMPLANT
SPECIMEN JAR MEDIUM (MISCELLANEOUS) ×4 IMPLANT
SPONGE ABDOMINAL VAC ABTHERA (MISCELLANEOUS) ×3 IMPLANT
SPONGE GAUZE 4X4 12PLY (GAUZE/BANDAGES/DRESSINGS) ×1 IMPLANT
SPONGE LAP 18X18 X RAY DECT (DISPOSABLE) ×2 IMPLANT
SPONGE SURGIFOAM ABS GEL 100 (HEMOSTASIS) IMPLANT
STAPLER VISISTAT 35W (STAPLE) ×11 IMPLANT
STRIP PERIGUARD 6X8 (Vascular Products) ×1 IMPLANT
SUT PDS AB 1 TP1 54 (SUTURE) ×8 IMPLANT
SUT PROLENE 3 0 SH 48 (SUTURE) ×1 IMPLANT
SUT PROLENE 3 0 SH DA (SUTURE) ×9 IMPLANT
SUT PROLENE 3 0 SH1 36 (SUTURE) ×8 IMPLANT
SUT PROLENE 5 0 C 1 24 (SUTURE) ×8 IMPLANT
SUT PROLENE 5 0 C 1 36 (SUTURE) ×15 IMPLANT
SUT PROLENE 5 0 CC 1 (SUTURE) ×8 IMPLANT
SUT PROLENE 6 0 C 1 30 (SUTURE) ×4 IMPLANT
SUT PROLENE 6 0 CC (SUTURE) ×8 IMPLANT
SUT SILK 2 0 (SUTURE) ×4
SUT SILK 2 0SH CR/8 30 (SUTURE) ×4 IMPLANT
SUT SILK 2-0 18XBRD TIE 12 (SUTURE) IMPLANT
SUT SILK 3 0 (SUTURE) ×4
SUT SILK 3-0 18XBRD TIE 12 (SUTURE) ×1 IMPLANT
SUT SILK 4 0 (SUTURE) ×4
SUT SILK 4-0 18XBRD TIE 12 (SUTURE) IMPLANT
SUT VIC AB 2-0 CT1 36 (SUTURE) IMPLANT
SUT VIC AB 3-0 SH 27 (SUTURE) ×4
SUT VIC AB 3-0 SH 27X BRD (SUTURE) IMPLANT
SYR 3ML LL SCALE MARK (SYRINGE) ×2 IMPLANT
TOWEL OR 17X24 6PK STRL BLUE (TOWEL DISPOSABLE) ×8 IMPLANT
TOWEL OR 17X26 10 PK STRL BLUE (TOWEL DISPOSABLE) ×8 IMPLANT
TRAY FOLEY CATH 14FRSI W/METER (CATHETERS) ×4 IMPLANT
WATER STERILE IRR 1000ML POUR (IV SOLUTION) ×7 IMPLANT

## 2012-09-21 NOTE — ED Notes (Signed)
Pt to xray

## 2012-09-21 NOTE — Anesthesia Preprocedure Evaluation (Signed)
Anesthesia Evaluation  Patient identified by MRN, date of birth, ID band Patient awake    Reviewed: Allergy & Precautions, H&P , NPO status , Patient's Chart, lab work & pertinent test results  Airway Mallampati: I TM Distance: <3 FB Neck ROM: Full    Dental   Pulmonary COPDCurrent Smoker,  + rhonchi   + decreased breath sounds      Cardiovascular + Peripheral Vascular Disease + dysrhythmias Atrial Fibrillation Rhythm:Irregular Rate:Tachycardia  Ruptured AAA   Neuro/Psych    GI/Hepatic (+)     substance abuse  alcohol use,   Endo/Other    Renal/GU      Musculoskeletal   Abdominal (+)  Abdomen: rigid.    Peds  Hematology   Anesthesia Other Findings   Reproductive/Obstetrics                           Anesthesia Physical Anesthesia Plan  ASA: IV and emergent  Anesthesia Plan: General   Post-op Pain Management:    Induction: Intravenous  Airway Management Planned: Oral ETT  Additional Equipment: Arterial line and PA Cath  Intra-op Plan:   Post-operative Plan: Possible Post-op intubation/ventilation  Informed Consent: I have reviewed the patients History and Physical, chart, labs and discussed the procedure including the risks, benefits and alternatives for the proposed anesthesia with the patient or authorized representative who has indicated his/her understanding and acceptance.     Plan Discussed with: CRNA and Surgeon  Anesthesia Plan Comments:         Anesthesia Quick Evaluation

## 2012-09-21 NOTE — Progress Notes (Signed)
Pt's EKG results back.  Dr. Irene Limbo notified of results via text page.

## 2012-09-21 NOTE — H&P (Addendum)
History and Physical  Charles [name] ZOX:[id number] DOB: [birth year] DOA: 09/21/2012  Referring physician: Nelva Nay, MD PCP: Colette Ribas, MD   Chief Complaint: Mouth pain  HPI:  67 year old man presented with multiple complaints to the emergency department including mouth pain, abdominal and low back pain for 2 months. Workup in the emergency department was notable for hyponatremia, possible urinary tract infection.  History obtained from patient and friend at bedside. Patient is a very poor historian and despite repeated questioning in various manners his answers remained vague. His first complaint is mouth pain which she has had for some time. He feels like he needs penicillin. On further questioning he also endorses abdominal pain and low back pain which been present for 2 months since a fall at that time. He has difficulty describing the character, frequency and intensity of the pain. He has been taking narcotics without relief. Since his fall he has had some difficulty ambulating and has been using a walker. He has had problems with left leg weakness for 2 months now. He denies further falls. He also notes abdominal pain which he has great difficulty further characterizing. He denies nausea or vomiting. He has had increasing difficulty caring for himself and is no longer cooking for herself. His oral intake has been minimal and he has been losing weight over the last several weeks or longer. He does not follow regularly with his primary care physician. No specific infectious symptoms and no history of tick bite. She denies recent alcohol use. He has had decreased urination.  In the emergency department noted to be afebrile, hypertensive; vitals otherwise were unremarkable. Sodium 126, chloride 85. AST elevated.  Chart Review:  ER visit 4/17: Complained of left side pain for 5 weeks with difficulty walking. No evidence of DVT at that time. X-ray was negative. Patient was discharged  home.  ER visit 4/16: Presented with constant low back pain 3-4 weeks. Assessment/plan unclear. Discharged from emergency department.   ER visit 4/13: Presented with history of fall 2 days prior. Fall was on the back. X-rays were unremarkable, patient improved and was able to ambulate and discharged.  Review of Systems:  Negative for fever, visual changes, sore throat, rash, chest pain, SOB, dysuria, bleeding, n/v.  Past Medical History  Diagnosis Date  . Hypercholesteremia   . Collapsed lung     s/p fall, rib fracture    Past Surgical History  Procedure Laterality Date  . Hernia repair    . Pleural scarification      collapsed lung    Social History:  reports that he has been smoking Cigarettes.  He has been smoking about 0.00 packs per day. He does not have any smokeless tobacco history on file. He reports that  drinks alcohol. He reports that he does not use illicit drugs. smokes 2 cigarettes a day. Denies alcohol the last 2 months.  No Known Allergies  Family History  Problem Relation Age of Onset  . Diabetes Mother   . Diabetes Father      Prior to Admission medications   Medication Sig Start Date End Date Taking? Authorizing Provider  ibuprofen (ADVIL,MOTRIN) 200 MG tablet Take 400 mg by mouth every 6 (six) hours as needed for pain.   Yes Historical Provider, MD  oxyCODONE-acetaminophen (PERCOCET/ROXICET) 5-325 MG per tablet Take 1 tablet by mouth every 4 (four) hours as needed for pain.   Yes Historical Provider, MD   Physical Exam: Filed Vitals:   09/21/12 0923  BP:  160/101  Pulse: 97  Temp: 99.2 F (37.3 C)  TempSrc: Oral  Resp: 18  Height: 5\' 4"  (1.626 m)  Weight: 58.968 kg (130 lb)  SpO2: 96%    General: Examined in the emergency department. Appears calm and comfortable. Appears malnourished, bitemporal wasting. Very thin. Eyes: PERRL, normal lids, irises ENT: grossly normal hearing, lips & tongue. Very poor dentition with obvious caries. Neck: no  LAD, masses or thyromegaly Cardiovascular: Irregular, normal rate. No murmur, rub, gallop. No LE edema. Respiratory: CTA bilaterally, no w/r/r. Normal respiratory effort. Abdomen: soft, ntnd, no hepatosplenomegaly noted. Skin: no rash or induration seen. Back without acute bruising or other abnormalities noted. Nontender to palpation thoracic lumbar spine. Musculoskeletal: Grossly normal tone bilateral upper extremities. Able to turn in bed without assistance. Decreased muscle mass globally. Right lower extremity strength 5/5. Left lower extremity 4/5. Psychiatric: grossly normal mood and affect, speech fluent and appropriate Neurologic: grossly non-focal. Sensation lower extremities grossly intact.  Wt Readings from Last 3 Encounters:  09/21/12 58.968 kg (130 lb)  09/16/12 58.968 kg (130 lb)  09/15/12 58.968 kg (130 lb)    Labs on Admission:  Basic Metabolic Panel:  Recent Labs Lab 09/21/12 0932  NA 126*  K 3.8  CL 85*  CO2 31  GLUCOSE 133*  BUN 15  CREATININE 0.55  CALCIUM 9.0    Liver Function Tests:  Recent Labs Lab 09/21/12 0932  AST 52*  ALT 40  ALKPHOS 70  BILITOT 0.7  PROT 7.0  ALBUMIN 2.5*    CBC:  Recent Labs Lab 09/21/12 0932  WBC 9.0  NEUTROABS 7.3  HGB 11.3*  HCT 33.0*  MCV 94.6  PLT 148*    Cardiac Enzymes:  Recent Labs Lab 09/21/12 0932  CKTOTAL 364*  TROPONINI <0.30    Radiological Exams on Admission: Dg Chest 2 View  09/21/2012  *RADIOLOGY REPORT*  Clinical Data: Weakness and weight loss  CHEST - 2 VIEW  Comparison: 10/24/2004  Findings: The heart and pulmonary vascularity are within normal limits.  Some chronic changes are noted in the right lung.  No focal infiltrate or sizable effusion is seen.  Mild hyperinflation is noted.  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   Original Report Authenticated By: Alcide Clever, M.D.     EKG: Independently reviewed. Sinus rhythm with ectopy. Cannot exclude. Atrial fibrillation. T wave  inversion inferiorly. Left ventricular hypertrophy suspected. No previous EKG available for comparison.   Principal Problem:   Hyponatremia Active Problems:   Adult failure to thrive   Abdominal pain   Dehydration   Generalized weakness   Assessment/Plan 1. Hyponatremia, dehydration: Suspect secondary to poor oral intake. IV fluids. Basic metabolic panel in the morning. 2. Failure to thrive with weight loss: Possibly secondary to very poor calorie intake however concerning for malignancy. Check HIV, TSH. Nutrition consult. 3. Subacute abdominal pain: Status post fall 2 months ago. Exam benign. No evidence to suggest acute intra-abdominal process. Check abdominal x-ray. 4. Subacute low back pain: With associated left lower extremity weakness. Present now 2 months. Exam benign. History suggests sciatica.  Grossly normal neurologic exam musculoskeletal exam. Recent lumbar films unremarkable 5. Possible UTI: Reports subjective fever, chills. Infection somewhat doubted. Followup culture. No history of tick bite. Doubt that for numbness. Followup laboratory studies ordered in the emergency department. 6. Possible atrial fibrillation: Mild tachycardia, asymptomatic. Hydrate. Monitor on telemetry. Not a good warfarin candidate secondary to debility, poor compliance with outpatient followup. 7. Poor dentition: No acute issues noted. Recommended outpatient  dental referral. 8. Subclinical COPD 9. History of alcohol abuse: Denies alcohol the last 2 months though AST is elevated. CIWA. 10. Cigarette smoker: Recommend cessation. 11. Generalized weakness: Physical therapy. 12. Hypertension: Monitor.  Addendum: After admission, on arrival to floor patient developed marked, variable tachycardia 130 to 160s and left lower quadrant pain. Heart rate was in the context of fever. On monitoring appeared to have intermittent atrial fibrillation and therefore diltiazem infusion started. Patient was transferred to  step down. Repeat abdominal exam was benign although patient endorsed left lower quadrant pain. Testes and penis appear unremarkable. No hernia noted on exam. Given increased pain and fever and equivocal urinalysis will obtain CT to rule out acute intra-abdominal process.  Code Status: Full code Family Communication: Discussed with friend at bedside Disposition Plan/Anticipated LOS: Admit. 2-3 days.  Time spent: 15 minutes  Brendia Sacks, MD  [facility] Pager [phone] 09/21/2012, 11:23 AM

## 2012-09-21 NOTE — ED Notes (Signed)
Pt unable to void at this time. 

## 2012-09-21 NOTE — ED Notes (Signed)
Pt states fell at beginning of April. Pt here for continued discomfort in abd, dental pain. Pt friend accompanying patient states patient is unable to take care of himself.

## 2012-09-21 NOTE — Progress Notes (Signed)
Report called to Marylene Land, RN in Stepdown/ICU.

## 2012-09-21 NOTE — H&P (Addendum)
[facility] [place] HISTORY AND PHYSICAL   History of Present Illness:  Patient is a 67 y.o. male transferred from [facility] with ruptured aneurysm call received from [facility] requesting transfer at 1019 pm.  Arrived at [facility] 11:36.  Pt was tachycardic but normtensive at that time.  Per [facility] records pt has had off and on chronic abominal pain for a few weeks.  He was noticed to be in atrial fibrillation on admission to [facility].  He was also found to have protein calorie malnutrition with albumin 2.5.  He apparently has some alcohol history but was a poor historian .  Other medical problems include elevated cholesterol, prior pneumothorax with pleurodesis, heavy smoker.  Past Medical History  Diagnosis Date  . Hypercholesteremia   . Collapsed lung     s/p fall, rib fracture    Past Surgical History  Procedure Laterality Date  . Hernia repair    . Pleural scarification      collapsed lung     Social History History  Substance Use Topics  . Smoking status: Current Every Day Smoker    Types: Cigarettes  . Smokeless tobacco: Not on file  . Alcohol Use: Yes     Comment: former    Family History Family History  Problem Relation Age of Onset  . Diabetes Mother   . Diabetes Father     Allergies  No Known Allergies   Current Facility-Administered Medications  Medication Dose Route Frequency Provider Last Rate Last Dose  . 0.9 %  sodium chloride infusion   Intravenous Continuous Standley Brooking, MD 100 mL/hr at 09/21/12 1416    . alum & mag hydroxide-simeth (MAALOX/MYLANTA) 200-200-20 MG/5ML suspension 30 mL  30 mL Oral Q6H PRN Standley Brooking, MD      . cefTRIAXone (ROCEPHIN) 1 g in dextrose 5 % 50 mL IVPB  1 g Intravenous Q24H Standley Brooking, MD   1 g at 09/21/12 1435  . diltiazem (CARDIZEM) 100 mg in dextrose 5 % 100 mL infusion  5-15 mg/hr Intravenous Titrated Standley Brooking, MD 15 mL/hr at 09/21/12 2200 15 mg/hr at 09/21/12  2200  . morphine 2 MG/ML injection 2 mg  2 mg Intravenous Q4H PRN Standley Brooking, MD      . ondansetron [facility]) injection 4 mg  4 mg Intravenous Q6H PRN Standley Brooking, MD      . sodium chloride 0.9 % injection 3 mL  3 mL Intravenous Q12H Standley Brooking, MD        ROS:   Unable to obtain due to patient being in extremis  Physical Examination Filed Vitals:   09/21/12 1900 09/21/12 2013 09/21/12 2057 09/21/12 2256  BP: 168/102     Pulse: 113   120  Temp:   99.9 F (37.7 C)   TempSrc:   Oral   Resp: 24   28  Height:      Weight:      SpO2: 98% 86%  96%    BMI 17 General:  Alert and oriented, no acute distress HEENT: Normal Neck: No bruit or JVD Pulmonary: Clear to auscultation bilaterally, left chest scar Cardiac: Regular Rate and Rhythm Abdomen: Soft, non-tender, non-distended, no scars vaguely palpable abdominal mass Skin: No rash Extremity Pulses:  2+ radial, brachial, femoral, absent dorsalis pedis, posterior tibial pulses bilaterally Musculoskeletal: No deformity or edema  Neurologic: Upper and lower extremity motor 5/5 and symmetric  DATA: CTA reviewed infrarenal  AAA with left retroperitoneal rupture, subtotal occlusion right common iliac, renal vein anterior heavy calcification of aortic neck and renals  CBC    Component Value Date/Time   WBC 12.9* 09/21/2012 2214   RBC 3.29* 09/21/2012 2214   HGB 10.9* 09/21/2012 2214   HCT 30.5* 09/21/2012 2214   PLT 150 09/21/2012 2214   MCV 92.7 09/21/2012 2214   MCH 33.1 09/21/2012 2214   MCHC 35.7 09/21/2012 2214   RDW 12.1 09/21/2012 2214   LYMPHSABS 0.9 09/21/2012 0932   MONOABS 0.9 09/21/2012 0932   EOSABS 0.0 09/21/2012 0932   BASOSABS 0.0 09/21/2012 0932    BMET    Component Value Date/Time   NA 121* 09/21/2012 2214   K 3.5 09/21/2012 2214   CL 82* 09/21/2012 2214   CO2 27 09/21/2012 2214   GLUCOSE 133* 09/21/2012 2214   BUN 12 09/21/2012 2214   CREATININE 0.44* 09/21/2012 2214   CALCIUM 8.3* 09/21/2012 2214    GFRNONAA >90 09/21/2012 2214   GFRAA >90 09/21/2012 2214       ASSESSMENT: 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  Risks benefits possible complications explained to pt including but not limited to bleeding infection myocardial events renal failure death.  He wishes to proceed.   PLAN:  Open repair AAA  Fabienne Bruns, MD [facility] [place] Office: [phone] Pager: [phone]

## 2012-09-21 NOTE — ED Provider Notes (Signed)
History  This chart was scribed for Charles Shi, MD by Ardeen Jourdain, ED Scribe. This patient was seen in room [facility]18/[facility]18 and the patient's care was started at 0927.  CSN: [id number]  Arrival date & time 09/21/12  0905   First MD Initiated Contact with Patient 09/21/12 [phone]      Chief Complaint  Patient presents with  . Abdominal Pain  . Dental Pain  . Weakness     The history is provided by the patient. No language interpreter was used.    Charles [name] is a 67 y.o. male who presents to the Emergency Department complaining of gradual onset, gradually worsening, constant abdominal pain with associated chills and weight loss. Pt locates the pain to his lower pelvis. Pts friend states the pain radiates down to his hips and to his thighs. Pt has a h/o hernia. Pts friend states he is unable to take care of himself. Pts friend states the pain medication is not helping and his pain stays around an 8/10. Pts friend states he believes the pt lost weight because he is unable to cook his food. Pt states he is has slowed down drinking and smoking. Pt states his last alcoholic beverage (preferrs "bootleg moonshine") was 2 months ago.    Past Medical History  Diagnosis Date  . Hypercholesteremia   . Collapsed lung     s/p fall, rib fracture    Past Surgical History  Procedure Laterality Date  . Hernia repair    . Pleural scarification      collapsed lung    Family History  Problem Relation Age of Onset  . Diabetes Mother   . Diabetes Father     History  Substance Use Topics  . Smoking status: Current Every Day Smoker    Types: Cigarettes  . Smokeless tobacco: Not on file  . Alcohol Use: Yes     Comment: former      Review of Systems  Constitutional: Positive for appetite change. Negative for fever and chills.  Respiratory: Negative for shortness of breath.   Gastrointestinal: Positive for abdominal pain. Negative for nausea, vomiting and diarrhea.  Neurological:  Negative for weakness.  All other systems reviewed and are negative.    Allergies  Review of patient's allergies indicates no known allergies.  Home Medications   Current Outpatient Rx  Name  Route  Sig  Dispense  Refill  . ibuprofen (ADVIL,MOTRIN) 200 MG tablet   Oral   Take 400 mg by mouth every 6 (six) hours as needed for pain.         Marland Kitchen oxyCODONE-acetaminophen (PERCOCET/ROXICET) 5-325 MG per tablet   Oral   Take 1 tablet by mouth every 4 (four) hours as needed for pain.           Triage Vitals: BP 160/101  Pulse 97  Temp(Src) 99.2 F (37.3 C) (Oral)  Resp 18  Ht 5\' 4"  (1.626 m)  Wt 130 lb (58.968 kg)  BMI 22.3 kg/m2  SpO2 96%  Physical Exam  Nursing note and vitals reviewed. Constitutional: He is oriented to person, place, and time. He appears well-developed and well-nourished. No distress.  HENT:  Head: Normocephalic and atraumatic.  Eyes: Pupils are equal, round, and reactive to light.  Neck: Normal range of motion.  Cardiovascular: Normal rate and intact distal pulses.  An irregularly irregular rhythm present.  Pulmonary/Chest: No respiratory distress.  Abdominal: Normal appearance. He exhibits no distension.  Musculoskeletal: Normal range of motion.  Neurological: He  is alert and oriented to person, place, and time. No cranial nerve deficit.  Skin: Skin is warm and dry. No rash noted.  Psychiatric: He has a normal mood and affect. His behavior is normal.    ED Course  Procedures (including critical care time)  DIAGNOSTIC STUDIES: Oxygen Saturation is 96% on room air, normal by my interpretation.    COORDINATION OF CARE:  9:36 AM-Discussed treatment plan which includes CXR, CBC, CMP, UA, CK and troponin with pt at bedside and pt agreed to plan.   Date: 09/21/2012  Rate: 96  Rhythm: Atrial fibrillation  QRS Axis: normal  Intervals: normal  ST/T Wave abnormalities: Nonspecific ST-T changes  Conduction Disutrbances: none  Narrative  Interpretation: Abnormal EKG      Labs Reviewed  CBC WITH DIFFERENTIAL - Abnormal; Notable for the following:    RBC 3.49 (*)    Hemoglobin 11.3 (*)    HCT 33.0 (*)    Platelets 148 (*)    Neutrophils Relative 81 (*)    Lymphocytes Relative 10 (*)    All other components within normal limits  COMPREHENSIVE METABOLIC PANEL - Abnormal; Notable for the following:    Sodium 126 (*)    Chloride 85 (*)    Glucose, Bld 133 (*)    Albumin 2.5 (*)    AST 52 (*)    All other components within normal limits  URINALYSIS, ROUTINE W REFLEX MICROSCOPIC - Abnormal; Notable for the following:    Hgb urine dipstick SMALL (*)    Protein, ur TRACE (*)    Urobilinogen, UA 4.0 (*)    Leukocytes, UA SMALL (*)    All other components within normal limits  CK - Abnormal; Notable for the following:    Total CK 364 (*)    All other components within normal limits  URINE MICROSCOPIC-ADD ON - Abnormal; Notable for the following:    Bacteria, UA FEW (*)    All other components within normal limits  URINE CULTURE  TROPONIN I  ROCKY MTN SPOTTED FVR AB, IGG-BLOOD  ROCKY MTN SPOTTED FVR AB, IGM-BLOOD  LYME DISEASE DNA BY PCR(BORRELIA BURG)  B. BURGDORFI ANTIBODIES   Dg Chest 2 View  09/21/2012  *RADIOLOGY REPORT*  Clinical Data: Weakness and weight loss  CHEST - 2 VIEW  Comparison: 10/24/2004  Findings: The heart and pulmonary vascularity are within normal limits.  Some chronic changes are noted in the right lung.  No focal infiltrate or sizable effusion is seen.  Mild hyperinflation is noted.  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   Original Report Authenticated By: Alcide Clever, M.D.      1. Weakness   2. New onset atrial fibrillation   3. Hyponatremia   4. Hypoalbuminemia   5. Thrombocytopenia       MDM  I personally performed the services described in this documentation, which was scribed in my presence. The recorded information has been reviewed and considered.    Charles Shi,  MD 09/21/12 1151

## 2012-09-21 NOTE — ED Notes (Signed)
Pt reports severe pain to his left side, notified hospitalist

## 2012-09-21 NOTE — Progress Notes (Signed)
Pt left unit via [facility]. Report called to OR holding area

## 2012-09-22 ENCOUNTER — Encounter (HOSPITAL_COMMUNITY): Payer: Self-pay | Admitting: Certified Registered Nurse Anesthetist

## 2012-09-22 ENCOUNTER — Inpatient Hospital Stay (HOSPITAL_COMMUNITY): Payer: Medicare Other

## 2012-09-22 ENCOUNTER — Telehealth: Payer: Self-pay | Admitting: Vascular Surgery

## 2012-09-22 DIAGNOSIS — S35239A Unspecified injury of inferior mesenteric artery, initial encounter: Secondary | ICD-10-CM

## 2012-09-22 DIAGNOSIS — R6520 Severe sepsis without septic shock: Secondary | ICD-10-CM

## 2012-09-22 DIAGNOSIS — I700 Atherosclerosis of aorta: Secondary | ICD-10-CM

## 2012-09-22 DIAGNOSIS — R6521 Severe sepsis with septic shock: Secondary | ICD-10-CM

## 2012-09-22 DIAGNOSIS — I713 Abdominal aortic aneurysm, ruptured: Secondary | ICD-10-CM

## 2012-09-22 DIAGNOSIS — A419 Sepsis, unspecified organism: Secondary | ICD-10-CM

## 2012-09-22 DIAGNOSIS — S35513A Injury of unspecified iliac artery, initial encounter: Secondary | ICD-10-CM

## 2012-09-22 DIAGNOSIS — I743 Embolism and thrombosis of arteries of the lower extremities: Secondary | ICD-10-CM

## 2012-09-22 DIAGNOSIS — J9600 Acute respiratory failure, unspecified whether with hypoxia or hypercapnia: Secondary | ICD-10-CM | POA: Diagnosis not present

## 2012-09-22 LAB — HEMOGLOBIN AND HEMATOCRIT, BLOOD
HCT: 25.1 % — ABNORMAL LOW (ref 39.0–52.0)
HCT: 24.3 % — ABNORMAL LOW (ref 39.0–52.0)
Hemoglobin: 9.1 g/dL — ABNORMAL LOW (ref 13.0–17.0)
Hemoglobin: 8.8 g/dL — ABNORMAL LOW (ref 13.0–17.0)

## 2012-09-22 LAB — POCT I-STAT 3, ART BLOOD GAS (G3+)
Acid-base deficit: 4 mmol/L — ABNORMAL HIGH (ref 0.0–2.0)
Acid-base deficit: 5 mmol/L — ABNORMAL HIGH (ref 0.0–2.0)
Bicarbonate: 22.3 mEq/L (ref 20.0–24.0)
Bicarbonate: 23.4 mEq/L (ref 20.0–24.0)
O2 Saturation: 98 %
Patient temperature: 35.1
Patient temperature: 98.6
TCO2: 24 mmol/L (ref 0–100)
TCO2: 25 mmol/L (ref 0–100)
pH, Arterial: 7.244 — ABNORMAL LOW (ref 7.350–7.450)
pH, Arterial: 7.289 — ABNORMAL LOW (ref 7.350–7.450)

## 2012-09-22 LAB — POCT I-STAT 7, (LYTES, BLD GAS, ICA,H+H)
Acid-base deficit: 8 mmol/L — ABNORMAL HIGH (ref 0.0–2.0)
Acid-base deficit: 3 mmol/L — ABNORMAL HIGH (ref 0.0–2.0)
Bicarbonate: 21.2 mEq/L (ref 20.0–24.0)
Bicarbonate: 24.2 mEq/L — ABNORMAL HIGH (ref 20.0–24.0)
Bicarbonate: 29.8 mEq/L — ABNORMAL HIGH (ref 20.0–24.0)
Calcium, Ion: 0.95 mmol/L — ABNORMAL LOW (ref 1.13–1.30)
HCT: 31 % — ABNORMAL LOW (ref 39.0–52.0)
HCT: 25 % — ABNORMAL LOW (ref 39.0–52.0)
HCT: 24 % — ABNORMAL LOW (ref 39.0–52.0)
Hemoglobin: 10.5 g/dL — ABNORMAL LOW (ref 13.0–17.0)
Hemoglobin: 8.5 g/dL — ABNORMAL LOW (ref 13.0–17.0)
Hemoglobin: 11.2 g/dL — ABNORMAL LOW (ref 13.0–17.0)
Hemoglobin: 8.2 g/dL — ABNORMAL LOW (ref 13.0–17.0)
O2 Saturation: 100 %
Patient temperature: 35.6
Potassium: 4 mEq/L (ref 3.5–5.1)
Potassium: 3.5 mEq/L (ref 3.5–5.1)
Sodium: 131 mEq/L — ABNORMAL LOW (ref 135–145)
Sodium: 131 mEq/L — ABNORMAL LOW (ref 135–145)
Sodium: 135 mEq/L (ref 135–145)
TCO2: 23 mmol/L (ref 0–100)
TCO2: 24 mmol/L (ref 0–100)
TCO2: 31 mmol/L (ref 0–100)
pCO2 arterial: 35.5 mmHg (ref 35.0–45.0)
pCO2 arterial: 44.3 mmHg (ref 35.0–45.0)
pH, Arterial: 7.115 — CL (ref 7.350–7.450)
pH, Arterial: 7.205 — ABNORMAL LOW (ref 7.350–7.450)
pH, Arterial: 7.32 — ABNORMAL LOW (ref 7.350–7.450)
pO2, Arterial: 431 mmHg — ABNORMAL HIGH (ref 80.0–100.0)
pO2, Arterial: 467 mmHg — ABNORMAL HIGH (ref 80.0–100.0)
pO2, Arterial: 474 mmHg — ABNORMAL HIGH (ref 80.0–100.0)

## 2012-09-22 LAB — BASIC METABOLIC PANEL
BUN: 12 mg/dL (ref 6–23)
CO2: 23 mEq/L (ref 19–32)
CO2: 25 mEq/L (ref 19–32)
Calcium: 6.5 mg/dL — ABNORMAL LOW (ref 8.4–10.5)
Calcium: 6.7 mg/dL — ABNORMAL LOW (ref 8.4–10.5)
Creatinine, Ser: 0.52 mg/dL (ref 0.50–1.35)
GFR calc non Af Amer: >90 mL/min (ref >90)
Glucose, Bld: 148 mg/dL — ABNORMAL HIGH (ref 70–99)
Glucose, Bld: 99 mg/dL (ref 70–99)
Potassium: 3.6 mEq/L (ref 3.5–5.1)
Sodium: 136 mEq/L (ref 135–145)
Sodium: 133 mEq/L — ABNORMAL LOW (ref 135–145)

## 2012-09-22 LAB — COMPREHENSIVE METABOLIC PANEL
ALT: 27 U/L (ref 0–53)
AST: 57 U/L — ABNORMAL HIGH (ref 0–37)
Calcium: 6.6 mg/dL — ABNORMAL LOW (ref 8.4–10.5)
Creatinine, Ser: 0.49 mg/dL — ABNORMAL LOW (ref 0.50–1.35)
GFR calc Af Amer: >90 mL/min (ref >90)
Glucose, Bld: 157 mg/dL — ABNORMAL HIGH (ref 70–99)
Sodium: 135 mEq/L (ref 135–145)
Total Protein: 4.8 g/dL — ABNORMAL LOW (ref 6.0–8.3)

## 2012-09-22 LAB — PROTIME-INR
INR: 1.41 (ref 0.00–1.49)
INR: 1.32 (ref 0.00–1.49)
Prothrombin Time: 16.9 seconds — ABNORMAL HIGH (ref 11.6–15.2)
Prothrombin Time: 16.1 seconds — ABNORMAL HIGH (ref 11.6–15.2)

## 2012-09-22 LAB — TROPONIN I
Troponin I: <0.3 ng/mL (ref <0.30)
Troponin I: <0.3 ng/mL (ref <0.30)

## 2012-09-22 LAB — CBC
Hemoglobin: 9.3 g/dL — ABNORMAL LOW (ref 13.0–17.0)
Platelets: 80 10*3/uL — ABNORMAL LOW (ref 150–400)
RBC: 2.98 MIL/uL — ABNORMAL LOW (ref 4.22–5.81)
WBC: 13.4 10*3/uL — ABNORMAL HIGH (ref 4.0–10.5)

## 2012-09-22 LAB — MAGNESIUM: Magnesium: 1.2 mg/dL — ABNORMAL LOW (ref 1.5–2.5)

## 2012-09-22 LAB — POCT I-STAT 4, (NA,K, GLUC, HGB,HCT)
HCT: 28 % — ABNORMAL LOW (ref 39.0–52.0)
Hemoglobin: 9.5 g/dL — ABNORMAL LOW (ref 13.0–17.0)
Sodium: 135 mEq/L (ref 135–145)

## 2012-09-22 LAB — GLUCOSE, CAPILLARY: Glucose-Capillary: 112 mg/dL — ABNORMAL HIGH (ref 70–99)

## 2012-09-22 LAB — HEMOGLOBIN A1C: Mean Plasma Glucose: 120 mg/dL — ABNORMAL HIGH (ref <117)

## 2012-09-22 LAB — CK TOTAL AND CKMB (NOT AT ARMC): Total CK: 1191 U/L — ABNORMAL HIGH (ref 7–232)

## 2012-09-22 MED ORDER — MAGNESIUM SULFATE 40 MG/ML IJ SOLN
2.0000 g | Freq: Every day | INTRAMUSCULAR | Status: DC | PRN
  Administered 2012-09-22: 2 g via INTRAVENOUS
  Filled 2012-09-22 (×2): qty 50

## 2012-09-22 MED ORDER — POTASSIUM CHLORIDE 10 MEQ/50ML IV SOLN
10.0000 meq | INTRAVENOUS | Status: AC
  Administered 2012-09-22 (×2): 10 meq via INTRAVENOUS
  Filled 2012-09-22: qty 100

## 2012-09-22 MED ORDER — LACTATED RINGERS IV SOLN
INTRAVENOUS | Status: DC | PRN
  Administered 2012-09-21: via INTRAVENOUS

## 2012-09-22 MED ORDER — MIDAZOLAM HCL 5 MG/5ML IJ SOLN
INTRAMUSCULAR | Status: DC | PRN
  Administered 2012-09-21: 2 mg via INTRAVENOUS

## 2012-09-22 MED ORDER — 0.9 % SODIUM CHLORIDE (POUR BTL) OPTIME
TOPICAL | Status: DC | PRN
  Administered 2012-09-22: 1000 mL

## 2012-09-22 MED ORDER — THROMBIN 20000 UNITS EX SOLR
OROMUCOSAL | Status: DC | PRN
  Administered 2012-09-22: 02:00:00 via TOPICAL

## 2012-09-22 MED ORDER — IPRATROPIUM BROMIDE 0.02 % IN SOLN
0.5000 mg | RESPIRATORY_TRACT | Status: DC
  Administered 2012-09-22 – 2012-09-23 (×4): 0.5 mg via RESPIRATORY_TRACT
  Filled 2012-09-22 (×5): qty 2.5

## 2012-09-22 MED ORDER — SODIUM CHLORIDE 0.9 % IV SOLN: 500.0000 mL | Freq: Once | INTRAVENOUS | Status: AC | PRN

## 2012-09-22 MED ORDER — ALBUTEROL SULFATE (5 MG/ML) 0.5% IN NEBU
2.5000 mg | INHALATION_SOLUTION | RESPIRATORY_TRACT | Status: DC
  Administered 2012-09-22 – 2012-09-23 (×4): 2.5 mg via RESPIRATORY_TRACT
  Filled 2012-09-22 (×5): qty 0.5

## 2012-09-22 MED ORDER — POTASSIUM CHLORIDE CRYS ER 20 MEQ PO TBCR: 20.0000 meq | EXTENDED_RELEASE_TABLET | Freq: Every day | ORAL | Status: DC | PRN

## 2012-09-22 MED ORDER — CEFAZOLIN SODIUM 1-5 GM-% IV SOLN
1.0000 g | Freq: Three times a day (TID) | INTRAVENOUS | Status: DC
  Filled 2012-09-22 (×2): qty 50

## 2012-09-22 MED ORDER — IPRATROPIUM-ALBUTEROL 20-100 MCG/ACT IN AERS: 6.0000 | INHALATION_SPRAY | RESPIRATORY_TRACT | Status: DC

## 2012-09-22 MED ORDER — METOPROLOL TARTRATE 1 MG/ML IV SOLN
5.0000 mg | Freq: Four times a day (QID) | INTRAVENOUS | Status: DC
  Administered 2012-09-23 – 2012-10-04 (×41): 5 mg via INTRAVENOUS
  Filled 2012-09-22 (×50): qty 5

## 2012-09-22 MED ORDER — FENTANYL CITRATE 0.05 MG/ML IJ SOLN
INTRAMUSCULAR | Status: DC | PRN
  Administered 2012-09-22 (×2): 50 ug via INTRAVENOUS

## 2012-09-22 MED ORDER — DOPAMINE-DEXTROSE 3.2-5 MG/ML-% IV SOLN
2.0000 ug/kg/min | INTRAVENOUS | Status: DC
  Filled 2012-09-22: qty 250

## 2012-09-22 MED ORDER — ONDANSETRON HCL 4 MG/2ML IJ SOLN: 4.0000 mg | Freq: Four times a day (QID) | INTRAMUSCULAR | Status: DC | PRN

## 2012-09-22 MED ORDER — PROPOFOL 10 MG/ML IV BOLUS
INTRAVENOUS | Status: DC | PRN
  Administered 2012-09-22: 80 mg via INTRAVENOUS

## 2012-09-22 MED ORDER — DILTIAZEM HCL 100 MG IV SOLR
5.0000 mg/h | INTRAVENOUS | Status: DC
  Filled 2012-09-22: qty 100

## 2012-09-22 MED ORDER — LACTATED RINGERS IV SOLN
INTRAVENOUS | Status: DC | PRN
  Administered 2012-09-22 (×4): via INTRAVENOUS

## 2012-09-22 MED ORDER — DEXMEDETOMIDINE HCL IN NACL 200 MCG/50ML IV SOLN
0.4000 ug/kg/h | INTRAVENOUS | Status: DC
  Filled 2012-09-22: qty 50

## 2012-09-22 MED ORDER — KCL IN DEXTROSE-NACL 20-5-0.9 MEQ/L-%-% IV SOLN
INTRAVENOUS | Status: DC
  Filled 2012-09-22 (×3): qty 1000

## 2012-09-22 MED ORDER — PROMETHAZINE HCL 25 MG/ML IJ SOLN: 6.2500 mg | INTRAMUSCULAR | Status: DC | PRN

## 2012-09-22 MED ORDER — PANTOPRAZOLE SODIUM 40 MG IV SOLR
40.0000 mg | Freq: Every day | INTRAVENOUS | Status: DC
  Administered 2012-09-22 – 2012-10-02 (×11): 40 mg via INTRAVENOUS
  Filled 2012-09-22 (×12): qty 40

## 2012-09-22 MED ORDER — PHENOL 1.4 % MT LIQD: 1.0000 | OROMUCOSAL | Status: DC | PRN

## 2012-09-22 MED ORDER — SODIUM CHLORIDE 0.9 % IV SOLN
25.0000 ug/h | INTRAVENOUS | Status: DC
  Administered 2012-09-22: 10 ug/h via INTRAVENOUS
  Administered 2012-09-24: 50 ug/h via INTRAVENOUS
  Administered 2012-09-25 – 2012-09-26 (×2): 100 ug/h via INTRAVENOUS
  Administered 2012-09-27: 150 ug/h via INTRAVENOUS
  Administered 2012-09-28 – 2012-09-29 (×2): 100 ug/h via INTRAVENOUS
  Administered 2012-09-30: 150 ug/h via INTRAVENOUS
  Administered 2012-09-30 – 2012-10-01 (×2): 100 ug/h via INTRAVENOUS
  Filled 2012-09-22 (×11): qty 50

## 2012-09-22 MED ORDER — INSULIN ASPART 100 UNIT/ML ~~LOC~~ SOLN
0.0000 [IU] | SUBCUTANEOUS | Status: DC
  Administered 2012-09-22 – 2012-09-24 (×2): 2 [IU] via SUBCUTANEOUS
  Administered 2012-09-24: 3 [IU] via SUBCUTANEOUS
  Administered 2012-09-24 – 2012-09-30 (×16): 2 [IU] via SUBCUTANEOUS

## 2012-09-22 MED ORDER — SODIUM BICARBONATE 8.4 % IV SOLN
INTRAVENOUS | Status: DC | PRN
  Administered 2012-09-22 (×2): 100 meq via INTRAVENOUS

## 2012-09-22 MED ORDER — SODIUM CHLORIDE 0.9 % IV SOLN
10.0000 mg | INTRAVENOUS | Status: DC | PRN
  Administered 2012-09-22: 06:00:00 via INTRAVENOUS
  Administered 2012-09-22: 50 ug/min via INTRAVENOUS

## 2012-09-22 MED ORDER — SODIUM CHLORIDE 0.9 % IV SOLN
1.0000 mg/h | INTRAVENOUS | Status: DC
  Administered 2012-09-22: 3 mg/h via INTRAVENOUS
  Administered 2012-09-22: 1 mg/h via INTRAVENOUS
  Administered 2012-09-24: 3 mg/h via INTRAVENOUS
  Administered 2012-09-25 – 2012-09-27 (×3): 2 mg/h via INTRAVENOUS
  Administered 2012-09-28 (×2): 1 mg/h via INTRAVENOUS
  Administered 2012-09-29 – 2012-10-01 (×3): 2 mg/h via INTRAVENOUS
  Filled 2012-09-22 (×11): qty 10

## 2012-09-22 MED ORDER — MIDAZOLAM HCL 2 MG/2ML IJ SOLN
2.0000 mg | Freq: Once | INTRAMUSCULAR | Status: AC
  Administered 2012-09-22: 2 mg via INTRAVENOUS
  Filled 2012-09-22: qty 2

## 2012-09-22 MED ORDER — ALBUMIN HUMAN 5 % IV SOLN
INTRAVENOUS | Status: DC | PRN
  Administered 2012-09-22 (×3): via INTRAVENOUS

## 2012-09-22 MED ORDER — PIPERACILLIN-TAZOBACTAM 3.375 G IVPB
3.3750 g | Freq: Three times a day (TID) | INTRAVENOUS | Status: DC
  Administered 2012-09-22 – 2012-09-28 (×19): 3.375 g via INTRAVENOUS
  Filled 2012-09-22 (×21): qty 50

## 2012-09-22 MED ORDER — METOPROLOL TARTRATE 1 MG/ML IV SOLN
2.0000 mg | INTRAVENOUS | Status: AC | PRN
  Administered 2012-09-24 – 2012-09-25 (×2): 5 mg via INTRAVENOUS
  Filled 2012-09-22: qty 5

## 2012-09-22 MED ORDER — FUROSEMIDE 10 MG/ML IJ SOLN
INTRAMUSCULAR | Status: DC | PRN
  Administered 2012-09-22: 10 mg via INTRAMUSCULAR

## 2012-09-22 MED ORDER — MIDAZOLAM BOLUS VIA INFUSION
1.0000 mg | INTRAVENOUS | Status: DC | PRN
  Administered 2012-09-29: 1 mg via INTRAVENOUS
  Administered 2012-09-29: 2 mg via INTRAVENOUS
  Administered 2012-10-01: 1 mg via INTRAVENOUS
  Administered 2012-10-01: 2 mg via INTRAVENOUS
  Administered 2012-10-02: 1 mg via INTRAVENOUS
  Administered 2012-10-02 (×2): 2 mg via INTRAVENOUS
  Filled 2012-09-22: qty 2

## 2012-09-22 MED ORDER — MANNITOL 20 % IV SOLN: INTRAVENOUS | Status: DC | PRN

## 2012-09-22 MED ORDER — ACETAMINOPHEN 325 MG PO TABS
325.0000 mg | ORAL_TABLET | ORAL | Status: DC | PRN
  Administered 2012-09-25: 650 mg via ORAL
  Filled 2012-09-22: qty 2

## 2012-09-22 MED ORDER — CEFAZOLIN SODIUM-DEXTROSE 2-3 GM-% IV SOLR
INTRAVENOUS | Status: DC | PRN
  Administered 2012-09-22 (×2): 2 g via INTRAVENOUS

## 2012-09-22 MED ORDER — ALBUTEROL SULFATE (5 MG/ML) 0.5% IN NEBU
INHALATION_SOLUTION | RESPIRATORY_TRACT | Status: AC
  Administered 2012-09-22: 5 mg
  Filled 2012-09-22: qty 0.5

## 2012-09-22 MED ORDER — DEXMEDETOMIDINE HCL IN NACL 200 MCG/50ML IV SOLN
INTRAVENOUS | Status: DC | PRN
  Administered 2012-09-22: 0.7 ug/kg/h via INTRAVENOUS

## 2012-09-22 MED ORDER — ACETAMINOPHEN 325 MG RE SUPP
325.0000 mg | RECTAL | Status: DC | PRN
  Filled 2012-09-22: qty 2

## 2012-09-22 MED ORDER — SODIUM CHLORIDE 0.9 % IV SOLN
INTRAVENOUS | Status: DC
  Administered 2012-09-22 (×2): via INTRAVENOUS

## 2012-09-22 MED ORDER — LABETALOL HCL 5 MG/ML IV SOLN
10.0000 mg | INTRAVENOUS | Status: DC | PRN
  Administered 2012-09-25 – 2012-10-05 (×7): 10 mg via INTRAVENOUS
  Filled 2012-09-22 (×7): qty 4

## 2012-09-22 MED ORDER — ROCURONIUM BROMIDE 100 MG/10ML IV SOLN
INTRAVENOUS | Status: DC | PRN
  Administered 2012-09-22: 50 mg via INTRAVENOUS
  Administered 2012-09-22: 20 mg via INTRAVENOUS
  Administered 2012-09-22: 50 mg via INTRAVENOUS
  Administered 2012-09-22: 20 mg via INTRAVENOUS
  Administered 2012-09-22: 10 mg via INTRAVENOUS

## 2012-09-22 MED ORDER — DOPAMINE-DEXTROSE 3.2-5 MG/ML-% IV SOLN
INTRAVENOUS | Status: DC | PRN
  Administered 2012-09-22: 4 ug/kg/min via INTRAVENOUS

## 2012-09-22 MED ORDER — HYDRALAZINE HCL 20 MG/ML IJ SOLN
10.0000 mg | INTRAMUSCULAR | Status: DC | PRN
  Administered 2012-09-24 – 2012-09-29 (×2): 10 mg via INTRAVENOUS
  Filled 2012-09-22 (×3): qty 1

## 2012-09-22 MED ORDER — ALUM & MAG HYDROXIDE-SIMETH 200-200-20 MG/5ML PO SUSP
15.0000 mL | ORAL | Status: DC | PRN
  Administered 2012-09-22: 15 mL
  Filled 2012-09-22: qty 30

## 2012-09-22 MED ORDER — DILTIAZEM HCL 100 MG IV SOLR
100.0000 mg | INTRAVENOUS | Status: DC | PRN
  Administered 2012-09-21: 15 mg/h via INTRAVENOUS
  Administered 2012-09-22: 04:00:00 via INTRAVENOUS

## 2012-09-22 MED ORDER — MIDAZOLAM HCL 10 MG/2ML IJ SOLN: 2.0000 mg | Freq: Once | INTRAMUSCULAR | Status: DC

## 2012-09-22 MED ORDER — MANNITOL 25 % IV SOLN
INTRAVENOUS | Status: DC | PRN
  Administered 2012-09-22: 12.5 g via INTRAVENOUS

## 2012-09-22 MED ORDER — MORPHINE SULFATE 2 MG/ML IJ SOLN: 2.0000 mg | INTRAMUSCULAR | Status: DC | PRN

## 2012-09-22 MED ORDER — SUCCINYLCHOLINE CHLORIDE 20 MG/ML IJ SOLN
INTRAMUSCULAR | Status: DC | PRN
  Administered 2012-09-22: 120 mg via INTRAVENOUS

## 2012-09-22 MED ORDER — HYDROMORPHONE HCL PF 1 MG/ML IJ SOLN: 0.2500 mg | INTRAMUSCULAR | Status: DC | PRN

## 2012-09-22 MED ORDER — PROTAMINE SULFATE 10 MG/ML IV SOLN
INTRAVENOUS | Status: DC | PRN
  Administered 2012-09-22: 25 mg via INTRAVENOUS
  Administered 2012-09-22: 5 mg via INTRAVENOUS
  Administered 2012-09-22: 20 mg via INTRAVENOUS

## 2012-09-22 MED ORDER — SODIUM CHLORIDE 0.9 % IR SOLN
Status: DC | PRN
  Administered 2012-09-22: 01:00:00

## 2012-09-22 MED ORDER — FENTANYL CITRATE 0.05 MG/ML IJ SOLN
100.0000 ug | Freq: Once | INTRAMUSCULAR | Status: AC
  Administered 2012-09-22: 100 ug via INTRAVENOUS
  Filled 2012-09-22: qty 2

## 2012-09-22 MED ORDER — LIDOCAINE HCL (CARDIAC) 20 MG/ML IV SOLN
INTRAVENOUS | Status: DC | PRN
  Administered 2012-09-22: 80 mg via INTRAVENOUS

## 2012-09-22 MED ORDER — FENTANYL BOLUS VIA INFUSION
25.0000 ug | Freq: Four times a day (QID) | INTRAVENOUS | Status: DC | PRN
  Administered 2012-09-28: 100 ug via INTRAVENOUS
  Administered 2012-09-28 – 2012-09-29 (×3): 50 ug via INTRAVENOUS
  Administered 2012-10-01 – 2012-10-02 (×2): 100 ug via INTRAVENOUS
  Filled 2012-09-22: qty 100

## 2012-09-22 MED ORDER — LACTATED RINGERS IV SOLN
INTRAVENOUS | Status: DC | PRN
  Administered 2012-09-22: 05:00:00 via INTRAVENOUS

## 2012-09-22 MED ORDER — HEPARIN SODIUM (PORCINE) 1000 UNIT/ML IJ SOLN
INTRAMUSCULAR | Status: DC | PRN
  Administered 2012-09-22 (×3): 5000 [IU] via INTRAVENOUS

## 2012-09-22 NOTE — Consult Note (Signed)
ANTIBIOTIC CONSULT NOTE - INITIAL  Pharmacy Consult for Zosyn Indication: UTI, bacteremia  No Known Allergies  Patient Measurements: Height: 5\' 4"  (162.6 cm) Weight: 99 lb 13.9 oz (45.3 kg) IBW/kg (Calculated) : 59.2  Vital Signs: Temp: 95.2 F (35.1 C) (04/23 0918) Temp src: Core (Comment) (04/23 0900) BP: 133/63 mmHg (04/23 0909) Pulse Rate: 91 (04/23 0918) Intake/Output from previous day: 04/22 0701 - 04/23 0700 In: 13161.7 [P.O.:100.9; I.V.:9960.8; Blood:1550; IV Piggyback:750] Out: 3540 [Urine:940; Blood:2600] Intake/Output from this shift: Total I/O In: 1136 [Blood:1136] Out: 450 [Urine:150; Drains:300]  Labs:  Recent Labs  09/21/12 0932 09/21/12 2214  09/22/12 0458 09/22/12 0541 09/22/12 0617  WBC 9.0 12.9*  --   --   --   --   HGB 11.3* 10.9*  < > 11.2* 8.2* 9.5*  PLT 148* 150  --   --   --   --   CREATININE 0.55 0.44*  --   --   --   --   < > = values in this interval not displayed. Estimated Creatinine Clearance: 56.6 ml/min (by C-G formula based on Cr of 0.44).  Medical History: Past Medical History  Diagnosis Date  . Hypercholesteremia   . Collapsed lung     s/p fall, rib fracture   Assessment: 66yom presented to [facility] with ruptured AAA and afib. Tx'ed to [facility] and he is now POD#0 open AAA repair. He will begin zosyn for GNR in his urine and blood. Renal function stable.  4/22 CTX x 1 dose 4/23 Ancef >>4/23 4/23 Zosyn>>  4/22 urine>>GNR 4/22 blood>>GNR  Goal of Therapy:  Appropriate zosyn dosing  Plan:  1) Zosyn 3.375g IV q8 (4 hour infusion) 2) Follow renal function, cultures, abx de-escalation  Fredrik Rigger 09/22/2012,9:28 AM

## 2012-09-22 NOTE — Progress Notes (Signed)
UR Completed.  Charles [name] [phone] 09/22/2012  

## 2012-09-22 NOTE — Anesthesia Postprocedure Evaluation (Signed)
  Anesthesia Post-op Note  Patient: Charles [name]  Procedure(s) Performed: Procedure(s) with comments: ANEURYSM ABDOMINAL AORTIC REPAIR (N/A) EMBOLECTOMY (Right) - Embolectomy of right leg. PATCH ANGIOPLASTY - Hemashield Patch Angioplasty fo Right Common Femoral Artery  Patient Location: ICU  Anesthesia Type:General  Level of Consciousness: sedated  Airway and Oxygen Therapy: Patient remains intubated per anesthesia plan  Post-op Pain: none  Post-op Assessment: Post-op Vital signs reviewed, Patient's Cardiovascular Status Stable, Respiratory Function Stable and Patent Airway  Post-op Vital Signs: Reviewed and stable  Complications: No apparent anesthesia complications

## 2012-09-22 NOTE — Brief Op Note (Signed)
09/21/2012 - 09/22/2012  7:38 AM  PATIENT:  Charles [name]  67 y.o. male  PRE-OPERATIVE DIAGNOSIS:  Ruptured Abdominal Aortic Aneurysm  POST-OPERATIVE DIAGNOSIS:  same  PROCEDURE:  Aorto left common iliac right common femoral bypass,aortic endarterectomy, right femoral endarterectomy profundaplasty, placement abdominal VAC SURGEON:  Surgeon(s) and Role:  Sherren Kerns, MD - Primary   PHYSICIAN ASSISTANT: Roczniak  ANESTHESIA:   general  Findings: Severe calcific atherosclerosis, 16x8 mm dacron graft left limb to common iliac, right limb to common femoral, IMA ligated, right internal and external iliac ligated, edematous but viable bowel.  Clamp above left renal below right renal  Pt has no doppler signals right foot, PT doppler left foot  To ICU critical  Fabienne Bruns, MD [facility] [place] Office: [phone] Pager: [phone]

## 2012-09-22 NOTE — Progress Notes (Signed)
INITIAL NUTRITION ASSESSMENT  DOCUMENTATION CODES Per approved criteria  -Severe malnutrition in the context of acute illness or injury -Underweight   INTERVENTION:  If EN started, recommend Promote formula at 20 ml/hr and increase by 10 ml every 12 hours to goal rate of 60 ml/hr to provide 1440 kcals, 90 gm protein, 1208 ml of free water  Monitor Mg, Phos, K+ levels RD to follow for nutrition care plan  NUTRITION DIAGNOSIS: Inadequate oral intake related to inability to eat as evidenced by NPO status  Goal: Initiate nutrition support within next 24-48 hours if prolonged intubation expected  Monitor:  Nutrition support initiation, respiratory status, weight, labs, I/O's  Reason for Assessment: VDRF  67 y.o. male  Admitting Dx: AAA (abdominal aortic aneurysm, ruptured)  ASSESSMENT: Patient presented with multiple complaints to the ER including mouth pain, abdominal and low back pain for 2 months; workup in the emergency department was notable for hyponatremia & possible UTI; transferred from [facility] for ruptured aneurysm.  RD spoke briefly with patient's son via telephone regarding nutrition hx ---> son reports patient was eating very poorly for 3 weeks PTA.  Patient s/p procedures 4/22: AORTO LEFT COMMON ILIAC RIGHT COMMON FEMORAL BYPASS AORTIC ENDARTERECTOMY RIGHT FEMORAL ENDARTERECTOMY PROFUNDAPLASTY PLACEMENT OF ABDOMINAL WOUND VAC  Patient is currently intubated on ventilator support MV: 11.6 Temp: 36.6  Patient meets criteria for severe malnutrition in the context of acute illness or injury given < 50% intake of estimated energy requirement for > 5 days and 23% weight loss in > 2 weeks.   Patient at risk for refeeding if nutrition support initiated given malnutrition identification.  Height: Ht Readings from Last 1 Encounters:  09/21/12 5\' 4"  (1.626 m)    Weight: Wt Readings from Last 1 Encounters:  09/21/12 99 lb 13.9 oz (45.3 kg)    Ideal Body Weight: 120  lb  % Ideal Body Weight: 82%  Wt Readings from Last 10 Encounters:  09/21/12 99 lb 13.9 oz (45.3 kg)  09/21/12 99 lb 13.9 oz (45.3 kg)  09/16/12 130 lb (58.968 kg)  09/15/12 130 lb (58.968 kg)  09/12/12 130 lb (58.968 kg)    Usual Body Weight: 130 lb  % Usual Body Weight: 76%  BMI:  Body mass index is 17.13 kg/(m^2).  Estimated Nutritional Needs: Kcal: 1350-1500 Protein: 80-90 gm Fluid: per MD  Skin: abdominal wound VAC  Diet Order: NPO  EDUCATION NEEDS: -No education needs identified at this time   Intake/Output Summary (Last 24 hours) at 09/22/12 1221 Last data filed at 09/22/12 1200  Gross per 24 hour  Intake 14807.55 ml  Output   5540 ml  Net 9267.55 ml    Labs:   Recent Labs Lab 09/21/12 0932 09/21/12 2214  09/22/12 0541 09/22/12 0617 09/22/12 0845  NA 126* 121*  < > 135 135 135  K 3.8 3.5  < > 3.5 3.8 3.5  CL 85* 82*  --   --   --  100  CO2 31 27  --   --   --  23  BUN 15 12  --   --   --  11  CREATININE 0.55 0.44*  --   --   --  0.49*  CALCIUM 9.0 8.3*  --   --   --  6.6*  MG  --  1.6  --   --   --  1.2*  GLUCOSE 133* 133*  --   --  138* 157*  < > = values in this  interval not displayed.   Scheduled Meds: . albuterol  2.5 mg Nebulization Q4H   And  . ipratropium  0.5 mg Nebulization Q4H  . insulin aspart  0-15 Units Subcutaneous Q4H  . metoprolol  5 mg Intravenous Q6H  . pantoprazole (PROTONIX) IV  40 mg Intravenous QHS  . piperacillin-tazobactam (ZOSYN)  IV  3.375 g Intravenous Q8H  . potassium chloride  10 mEq Intravenous Q1 Hr x 2    Continuous Infusions: . sodium chloride 100 mL/hr at 09/22/12 1000  . DOPamine Stopped (09/22/12 0930)  . fentaNYL infusion INTRAVENOUS 10 mcg/hr (09/22/12 1129)  . midazolam (VERSED) infusion 1 mg/hr (09/22/12 1129)    Past Medical History  Diagnosis Date  . Hypercholesteremia   . Collapsed lung     s/p fall, rib fracture    Past Surgical History  Procedure Laterality Date  . Hernia repair     . Pleural scarification      collapsed lung    Maureen Chatters, RD, LDN Pager #: [phone] After-Hours Pager #: [phone]

## 2012-09-22 NOTE — Op Note (Signed)
Procedure: Aorto to left common iliac and right common femoral artery bypass carotid sure to infrarenal abdominal aortic aneurysm, aortic endarterectomy, right common femoral endarterectomy with profundoplasty, ligation of inferior mesenteric artery right external iliac artery and right internal iliac artery  Preoperative diagnosis: Ruptured infrarenal abdominal aortic aneurysm  Postoperative diagnosis: Same  Anesthesia: Gen.  Assistant: Della Goo PA-C  Operative findings: Ruptured infrarenal abdominal aortic aneurysm posterior lateral left wall, 16 x 8 mm Dacron graft end-to-end to left common iliac artery in the side to right common femoral artery, Dacron patch right common femoral endarterectomy, bovine pericardial patch to assist in retroperitoneal coverage   Operative details: After obtaining informed consent, the patient was taken to the operating room. The patient was placed in supine position on the operating room table. After placement of lines by the anesthesia team, a general anesthetic was commenced. The patient was endotracheally intubated. A nasogastric tube was placed.  The patient was prepped and draped in usual sterile fashion from the nipples to the knees. A midline laparotomy incision was performed. The incision was extended from the xiphoid to the pubis carried down through the subcutaneous tissues and the fascia incised for the full length the incision. Upon entering the abdomen there was approximately 250-300 cc of ascites fluid. The liver was pale and slightly nodular. The small bowel was also fairly pale in appearance. The tissues were very friable. The small bowel was reflected to the right transverse colon was reflected superiorly an Omni retractor was brought in for assistance in retraction.  Next the common iliac artery was dissected free circumferentially. It was very calcified proximally but became softer and was clamped double needed iliac bifurcation. The right  common iliac artery however was severely calcified and not clampable. We carried the dissection down to the level of the iliac bifurcation. The right internal iliac artery was severely calcified. A vessel loop was placed around this. The right external iliac artery was slightly softer but still with a large layer of posterior plaque. The right common iliac artery was circumferentially calcified with thick calcific plaque. Next dissection was carried up to the level of the aortic neck. The duodenum was mobilized to the right. The pancreas was mobilized superiorly. The inferior mesenteric vein was ligated and divided between silk ties. There was some hematoma staining of the retroperitoneum just above the aortic bifurcation. The left renal vein was identified. This was dissected free circumferentially. It became evident from review the CT scan and examination of the patient's anatomy intraoperatively that this was going to require suprarenal clamp. The patient had a heavily calcified infrarenal aortic neck that would not be clampable. The left renal artery was dissected free circumferentially and a vessel loop placed around this. The left renal artery was circumferentially heavily calcified. The left renal vein was ligated and divided between 2 henley clamps. Each end of the renal vein was oversewn with a running 5-0 Prolene suture. The right renal artery was identified. However this was several centimeters higher than the left and there was a suitable area for clamping just above the left renal artery. There was a small amount of oozing adjacent to the left renal artery from a retroperitoneal vessel. This was packed with thrombin Gelfoam. The patient was given 5000 units of intravenous heparin. The right common iliac artery was ligated with a umbilical tape. However this was so calcified I was not sure that we had vascular control. Therefore the internal and external iliac arteries on the right side were  controlled  with angled Coarct clamps. The left common iliac artery was controlled with an angled coarct clamp. The aorta was then cross clamped just above the left renal artery and the left renal artery controlled with a vessel loop. The aorta was entered. You could easily tell that the aortic aneurysm had ruptured into the left posterior lateral wall there was a large hematoma in this area. He was some retroperitoneal oozing but this was not significant. There was one lumbar artery up in the aortic neck. However the aortic neck was severely calcified. I performed a circumferential aortic endarterectomy and removed a large amount of calcific plaque to make a suitable sewing surface. The lumbar bleeding in the neck was controlled with a lumbar stitch. A 16 x 8 mm Dacron graft was then brought in the operative field and this was sewn end-to-end to the infrarenal abdominal aorta just below the left renal artery. A circumferential felt strip was placed in the anastomosis. There was some bleeding from the inferior mesenteric artery and this was controlled with a lumbar stitch. At completion of the aortic anastomosis it was tested and there was one area of leak on the right lateral aspect. This was repaired with a single 3-0 Prolene pledgeted stitch. The clamp was then moved down to the bifurcation of each limb of the graft. Flow was restored to the left renal artery. There is good hemostasis at the aortic neck. Attention was then turned to the right common iliac system. I did not feel that the right external iliac artery was going to be suitable for anastomosis to the large amount posterior plaque. Therefore this point a longitudinal incision was made in the right groin carried onto the subcutaneous tissues down to level right common femoral artery. This also had some posterior plaque but the anterior surface was soft. The profunda femoris and superficial femoral arteries were dissected free circumferentially and vessel loops  placed around these. These were also heavily calcified. The right limb of the graft was tunneled retroperitoneal down to the right groin. The distal right external iliac artery was controlled with a Henley clamp. The superficial femoral and profunda femoris arteries were controlled with Vesseloops. A longitudinal opening was made in the anterior aspect of the right common femoral artery and the graft was cut to length an end of graft to side of artery is a running 5-0 Prolene suture. Just prior to completion of the anastomosis it was forebled backbled and thoroughly flushed the anastomosis was secured and the clamps released there was a brief transient systolic pressure drop which then quickly recovered. There is good hemostasis at the anastomosis. Attention was then turned to the left iliac system. The left common iliac artery was transected just above the iliac bifurcation. The graft was cut to length on the left side and sewn end-to-end to the iliac artery using running 5-0 Prolene suture. Prior to completion of the anastomosis it was forebled backbled and thoroughly flushed the anastomosis was secured clamps were released and pulsatile flow in the iliac artery and femoral artery immediately. There is also transient systolic pressure drop which he quickly recovered. At this point the retroperitoneum was inspected and found to have some oozing but overall fairly hemostatic. There was still bleeding from the right common iliac artery. The right external and internal carotid arteries required ligation to achieve hemostasis in this area. The feet were inspected and there was a good posterior tibial Doppler signal on the left foot. However there were no Doppler  signals in the right foot. Attention was then turned back to the abdomen. There was good pulsatile flow down each limb of the graft. Hemostasis was adequate. The retroperitoneum was closed over the aortic graft using a running 3-0 Vicryl suture. The upper  portion of the graft could not be covered by retroperitoneal tissues to the sheath of the lung pericardium was brought in the operative field this was sewn as a patch to cover the proximal aorta graft using a running 3-0 Vicryl suture. The viscera were then returned to their normal position. However there had been so much bowel wall edema that I could not return all of the viscera intra-abdominally. Therefore an abdominal VAC was placed and secured into position at 125 mm Hg continuous pressure. The bowel was covered with a bowel bag before placing the VAC sponge over the viscera. The NG tube was also confirmed to be in the mid stomach. After the [facility] was secured in place in the right groin was closed in multiple layers with running 3-0 Vicryl suture and staples in the skin. The entire prep and drape was then taken down. The entire right lower extremity is then re\re prepped and draped. The staples were removed from the right groin. A transverse arteriotomy was made in the right common femoral artery just below the right femoral anastomosis but just above the femoral bifurcation and a soft area the artery. 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 There was no backbleeding and essentially. There was total obliteration of the lumen with calcific plaque. The the right superficial artery was ligated with a 2-0 silk tie. A large exophytic plaque in the distal common femoral artery and extending and narrowing the profunda femoris artery and 90%. The profunda femoris was controlled distally with a fine bulldog clamp. An endarterectomy was performed extending approximately 2 cm in the profunda femoris and into the distal common femoral artery. At this plaque was removed there was a much more reasonable lumen. A Dacron patch was brought in operative field and a patch angioplasty  was performed of the distal common femoral artery and proximal profunda arteries and running 6-0 Prolene suture. Just prior completion anastomosis was forebled backbled and thoroughly flushed the anastomosis was secured clamps released there was good pulsatile flow into the profundus and common femoral artery immediately. There was good Doppler flow in the profunda. Patient still had no Doppler signals in the foot. It was decided at this point the patient was cold and required some pressor support. His profundus and common femoral and common iliac arteries were much improved from preoperatively. 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 I believed that hopefully he would get a Doppler signal back in the foot after being warmed. I did not feel that the patient would tolerate a femoral-popliteal bypass at this point. The right groin was again closed in multiple layers of running 3-0 Vicryl suture. The skin was closed with staples. The heparin was reversed with 50 mg of protamine.  The patient was taken to the intensive care unit in critical but stable condition. The instrument sponge and needle counts were correct at the end of the case.  Fabienne Bruns, MD [facility] Office: [phone] Pager: [phone]

## 2012-09-22 NOTE — Significant Event (Addendum)
0845-pt arrived to unit from OR on ventilator, drips as following (on precedex @ . , cardizem @ 5mg , dopamine @ , LR @ 20). PIV X 2 on left forearm. Pt also arrived to unit with noticeable left and anterior neck being swollen-#16g PIV on left IJ was subsequently capped off of fluid. MD Fields made aware of this as well as output from wound vac. Will continue to monitor. Arsenio Schnorr, Charity fundraiser.

## 2012-09-22 NOTE — Telephone Encounter (Signed)
Message copied by Fredrich Birks on Wed Sep 22, 2012  3:56 PM ------      Message from: Fabienne Bruns E      Created: Wed Sep 22, 2012 11:16 AM       LEvel 5 consult for rupture AAA from [facility] ER      Aortic endarterectomy, aorto left common iliac, right femoral, rightfemoral endarterectoy and profundaplasty, placement abdominal VAC, ligation IMA, right internal and external iliac arteries            Regina asst            Charles ------

## 2012-09-22 NOTE — Consult Note (Signed)
PULMONARY  / CRITICAL CARE MEDICINE  Name: Charles [name] MRN: [id number] DOB: December 18, 1945    ADMISSION DATE:  09/21/2012 CONSULTATION DATE:  09/22/12  REFERRING MD :  Darrick Penna PRIMARY SERVICE: Fields  CHIEF COMPLAINT:  Post AAA repair  BRIEF PATIENT DESCRIPTION: 67yo male smoker, ETOH tx from [place] 4/22 with ruptured AAA and Afib. Taken emergently to OR on arrival to [facility].  Pt remained vented post op with open abd and PCCM consulted for vent/medical mgmt.   SIGNIFICANT EVENTS / STUDIES:  4/23 CT abd/pelvis>>> rupturing AAA, contained in the retroperitoneum extending into the left iliopsoas muscle. 2. Free fluid in the abdomen, nonspecific. 4/23 OR -- AAA repair, Aorto left common iliac right common femoral bypass,aortic endarterectomy, right femoral endarterectomy profundaplasty, placement abdominal VAC  LINES / TUBES: ETT 4/23>>> R IJ PA cath 4/23>>> R radial artery 4/23>>>  CULTURES: blooc cultures x 2 4/23>>> Urine 4/23>>>GNR>>>  ANTIBIOTICS: Ancef 4/22>>>4/23 Zosyn 4/23>>>  HISTORY OF PRESENT ILLNESS:   67yo male smoker, ETOH presented to [facility] ED with c/o abd pain, back pain, L leg weakness.  Found to be hyponatremic, likely UTI.  Poor historian but a friend with him in ER stated he had increasing difficulty caring for himself at home and has lost significant amount of weight.    PAST MEDICAL HISTORY :  Past Medical History  Diagnosis Date  . Hypercholesteremia   . Collapsed lung     s/p fall, rib fracture   Past Surgical History  Procedure Laterality Date  . Hernia repair    . Pleural scarification      collapsed lung   Prior to Admission medications   Medication Sig Start Date End Date Taking? Authorizing Provider  ibuprofen (ADVIL,MOTRIN) 200 MG tablet Take 400 mg by mouth every 6 (six) hours as needed for pain.   Yes Historical Provider, MD  oxyCODONE-acetaminophen (PERCOCET/ROXICET) 5-325 MG per tablet Take 1 tablet by mouth every 4 (four) hours  as needed for pain.   Yes Historical Provider, MD   No Known Allergies  FAMILY HISTORY:  Family History  Problem Relation Age of Onset  . Diabetes Mother   . Diabetes Father    SOCIAL HISTORY:  reports that he has been smoking Cigarettes.  He has been smoking about 0.00 packs per day. He does not have any smokeless tobacco history on file. He reports that  drinks alcohol. He reports that he does not use illicit drugs.  REVIEW OF SYSTEMS:  Unable, pt sedated on vent    VITAL SIGNS: Temp:  [95.2 F (35.1 C)-102.3 F (39.1 C)] 95.2 F (35.1 C) (04/23 0918) Pulse Rate:  [80-134] 91 (04/23 0918) Resp:  [12-28] 16 (04/23 0918) BP: (133-193)/(63-126) 133/63 mmHg (04/23 0909) SpO2:  [83 %-100 %] 95 % (04/23 0918) Arterial Line BP: (127-147)/(57-68) 147/66 mmHg (04/23 0918) FiO2 (%):  [49.6 %-100 %] 49.6 % (04/23 0918) Weight:  [99 lb 13.9 oz (45.3 kg)-100 lb 5 oz (45.5 kg)] 99 lb 13.9 oz (45.3 kg) (04/22 1522) HEMODYNAMICS: PAP: (49-53)/(21-24) 53/24 mmHg CO:  [6.9 L/min] 6.9 L/min CI:  [4.7 L/min/m2] 4.7 L/min/m2 VENTILATOR SETTINGS: Vent Mode:  [-] PRVC FiO2 (%):  [49.6 %-100 %] 49.6 % Set Rate:  [16 bmp] 16 bmp Vt Set:  [600 mL] 600 mL PEEP:  [5 cmH20] 5 cmH20 Plateau Pressure:  [25 cmH20] 25 cmH20 INTAKE / OUTPUT: Intake/Output     04/22 0701 - 04/23 0700 04/23 0701 - 04/24 0700   P.O. 100.9  I.V. (mL/kg) 9960.8 (219.9)    Blood 1550 1136   Other 800    IV Piggyback 750    Total Intake(mL/kg) 13161.7 (290.6) 1136 (25.1)   Urine (mL/kg/hr) 940 150 (1.3)   Drains  300 (2.6)   Blood 2600    Total Output 3540 450   Net +9621.7 +686        Urine Occurrence 1 x      PHYSICAL EXAMINATION: General:  Frail, thin, chronically ill appearing male Neuro:  Sedated on vent HEENT:  Mm dry, pale, ETT, R IJ CVL  Cardiovascular:  S1s2, NSR (previously Afib) Lungs:  resps even, non labored on full vent support, exp wheeze  Abdomen:  Wound vac with bloody drainage, -bs,  distended Ext: warm and dry, no edema, weak pulses RLE  LABS:  Recent Labs Lab 09/21/12 0932 09/21/12 2214  09/22/12 0458 09/22/12 0541 09/22/12 0617 09/22/12 0845 09/22/12 0857 09/22/12 0912  HGB 11.3* 10.9*  < > 11.2* 8.2* 9.5* 9.3*  --   --   WBC 9.0 12.9*  --   --   --   --  13.4*  --   --   PLT 148* 150  --   --   --   --  80*  --   --   NA 126* 121*  < > 131* 135 135  --   --   --   K 3.8 3.5  < > 3.9 3.5 3.8  --   --   --   CL 85* 82*  --   --   --   --   --   --   --   CO2 31 27  --   --   --   --   --   --   --   GLUCOSE 133* 133*  --   --   --  138*  --   --   --   BUN 15 12  --   --   --   --   --   --   --   CREATININE 0.55 0.44*  --   --   --   --   --   --   --   CALCIUM 9.0 8.3*  --   --   --   --   --   --   --   MG  --  1.6  --   --   --   --   --   --   --   AST 52* 75*  --   --   --   --   --   --   --   ALT 40 40  --   --   --   --   --   --   --   ALKPHOS 70 76  --   --   --   --   --   --   --   BILITOT 0.7 0.9  --   --   --   --   --   --   --   PROT 7.0 6.6  --   --   --   --   --   --   --   ALBUMIN 2.5* 2.4*  --   --   --   --   --   --   --   TROPONINI <0.30  --   --   --   --   --   --   --   --  PHART  --   --   < > 7.205* 7.320*  --   --  7.244* 7.289*  PCO2ART  --   --   < > 51.4* 45.1*  --   --  54.0* 45.4*  PO2ART  --   --   < > 431.0* 402.0*  --   --  145.0* 116.0*  < > = values in this interval not displayed. No results found for this basename: GLUCAP,  in the last 168 hours  CXR:  Dg Chest 2 View  09/21/2012  *RADIOLOGY REPORT*  Clinical Data: Weakness and weight loss  CHEST - 2 VIEW  Comparison: 10/24/2004  Findings: The heart and pulmonary vascularity are within normal limits.  Some chronic changes are noted in the right lung.  No focal infiltrate or sizable effusion is seen.  Mild hyperinflation is noted.  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   Original Report Authenticated By: Alcide Clever, M.D.    Ct Abdomen Pelvis W  Contrast  09/21/2012  *RADIOLOGY REPORT*  Clinical Data: Abdominal pain and fever.  CT ABDOMEN AND PELVIS WITH CONTRAST  Technique:  Multidetector CT imaging of the abdomen and pelvis was performed following the standard protocol during bolus administration of intravenous contrast.  Contrast: 50mL OMNIPAQUE IOHEXOL 300 MG/ML  SOLN, OMNIPAQUE IOHEXOL 300 MG/ML  SOLN  Comparison: None.  Findings: The patient has a rupturing abdominal aortic aneurysm. The aneurysm has ruptured into the left psoas muscle with the hemorrhage extending down the left psoas muscle into the left iliopsoas muscle.  The rupture is contained within the retroperitoneum at this time.  There is a mass effect upon the left kidney and upon the adjacent bowel.  There is a pseudoaneurysm containing contrast at the site of the rupture. The back wall of the abdominal aorta is blown out asymmetric to the left 18 mm below the left renal artery.  There is a pseudoaneurysm extending laterally.  The liver appears normal except for dilatation of intra and extrahepatic bile ducts.  Common bile duct is dilated to a diameter of 8 mm.  There is no visible stone or mass in the distal bile duct. The spleen, pancreas, and adrenal glands are normal.  The kidneys are normal except for the mass effect on the left kidney by the retroperitoneal hemorrhage.  There are no dilated loops of large or small bowel.  There is moderate free fluid in the pelvis, nonspecific. There is a tiny left pleural effusion.  No acute osseous abnormality.  IMPRESSION: 1.  Rupturing abdominal aortic aneurysm, currently contained in the retroperitoneum extending into the left iliopsoas muscle. 2.  Free fluid in the abdomen, nonspecific.  Critical Value/emergent results were called by telephone at the time of interpretation on 09/21/2012 at 10:00 p.m. to Dr. Orvan Falconer, who verbally acknowledged these results.   Original Report Authenticated By: Francene Boyers, M.D.      ASSESSMENT /  PLAN:  PULMONARY A: Acute vent dependent respiratory failure -- post AAA repair.  COPD Active smoker  P:   - Full vent support - prvc  8cc/kg  - F/u abg  - CXR in am  - Add BD  - No steroids for now  CARDIOVASCULAR A: Afib  Ruptured AAA - s/p OR 4/23 Hypotension - resolved  HTN Severe iliac occlusive disease  P:  - Now NSR  - D/c cardizem, dopamine for now  - VVS following  - Gentle volume  - Check cardiac enzymes  - 12 lead  -  PRN hydralazine, lopressor for HTN  RENAL A:  No acute issue  P:   - F/u chem   GASTROINTESTINAL A:  Severe protein calorie malnutrition  ETOH P:   - F/u LFT's  - TF when ok with surgery - abd remains open, will likely need TPN.  HEMATOLOGIC A:  Coagulopathy  Blood loss anemia  P:  - Serial h/h, coags  - Blood products as needed   INFECTIOUS A:  UTI -- GNR  P:   - Broaden ancef to Zosyn  - F/u cultures   ENDOCRINE A:  Elevated blood sugars  P:   - SSI  - Check Hgb A1c   NEUROLOGIC A:  Sedated  P:   - Continuous sedation for now - Daily WUA   I have personally obtained a history, examined the patient, evaluated laboratory and imaging results, formulated the assessment and plan and placed orders.  CRITICAL CARE: The patient is critically ill with multiple organ systems failure and requires high complexity decision making for assessment and support, frequent evaluation and titration of therapies, application of advanced monitoring technologies and extensive interpretation of multiple databases. Critical Care Time devoted to patient care services described in this note is 45 minutes.   Alyson Reedy, M.D. Pulmonary and Critical Care Medicine [facility] Pager: [phone]  09/22/2012, 9:31 AM

## 2012-09-22 NOTE — Transfer of Care (Signed)
Immediate Anesthesia Transfer of Care Note  Patient: Charles [name]  Procedure(s) Performed: Procedure(s) with comments: ANEURYSM ABDOMINAL AORTIC REPAIR (N/A) EMBOLECTOMY (Right) - Embolectomy of right leg. PATCH ANGIOPLASTY - Hemashield Patch Angioplasty fo Right Common Femoral Artery  Patient Location: SICU  Anesthesia Type:General  Level of Consciousness: Patient remains intubated per anesthesia plan  Airway & Oxygen Therapy: Patient remains intubated per anesthesia plan and Patient placed on Ventilator (see vital sign flow sheet for setting)  Post-op Assessment: Report given to PACU RN and Post -op Vital signs reviewed and stable  Post vital signs: Reviewed and stable  Complications: No apparent anesthesia complications

## 2012-09-22 NOTE — Significant Event (Addendum)
Notified and spoken with vascular on-call Rene Kocher regarding amount of wound vac (1625cc total of serosanguinous fluids at this time and lots of foam in canister, since admission from OR). Per Rene Kocher, continue with present orders.   At 1510pm-Dr. Field came to bedside to see patient. Updated MD on patient's condition. Received a verbal order from MD for Maalox to instill into wound vac canister to assist in dissolving foam. Per wound vac RN who came earlier today, stated that Maalox will help in dissolving foaming in canister. Often times during the hour, wound vac machine would beep stating that cannister is full (when only 200cc in canister), but lots of foam in canister. Received verbal order for Maalax to instill in wound vac canister. Amaal Dimartino, Charity fundraiser.

## 2012-09-23 ENCOUNTER — Inpatient Hospital Stay (HOSPITAL_COMMUNITY): Payer: Medicare Other

## 2012-09-23 DIAGNOSIS — D696 Thrombocytopenia, unspecified: Secondary | ICD-10-CM

## 2012-09-23 DIAGNOSIS — I713 Abdominal aortic aneurysm, ruptured, unspecified: Principal | ICD-10-CM

## 2012-09-23 DIAGNOSIS — F1010 Alcohol abuse, uncomplicated: Secondary | ICD-10-CM

## 2012-09-23 DIAGNOSIS — I4891 Unspecified atrial fibrillation: Secondary | ICD-10-CM

## 2012-09-23 DIAGNOSIS — R7881 Bacteremia: Secondary | ICD-10-CM | POA: Diagnosis not present

## 2012-09-23 DIAGNOSIS — R627 Adult failure to thrive: Secondary | ICD-10-CM

## 2012-09-23 DIAGNOSIS — J9600 Acute respiratory failure, unspecified whether with hypoxia or hypercapnia: Secondary | ICD-10-CM

## 2012-09-23 LAB — CBC
HCT: 24 % — ABNORMAL LOW (ref 39.0–52.0)
MCHC: 36.7 g/dL — ABNORMAL HIGH (ref 30.0–36.0)
Platelets: 64 10*3/uL — ABNORMAL LOW (ref 150–400)
RDW: 15.4 % (ref 11.5–15.5)
WBC: 10.2 10*3/uL (ref 4.0–10.5)

## 2012-09-23 LAB — COMPREHENSIVE METABOLIC PANEL
AST: 93 U/L — ABNORMAL HIGH (ref 0–37)
Albumin: 1.7 g/dL — ABNORMAL LOW (ref 3.5–5.2)
Alkaline Phosphatase: 39 U/L (ref 39–117)
BUN: 17 mg/dL (ref 6–23)
Potassium: 3.5 mEq/L (ref 3.5–5.1)
Sodium: 136 mEq/L (ref 135–145)
Total Protein: 4.2 g/dL — ABNORMAL LOW (ref 6.0–8.3)

## 2012-09-23 LAB — BLOOD GAS, ARTERIAL
Acid-base deficit: 0.5 mmol/L (ref 0.0–2.0)
Bicarbonate: 23.5 mEq/L (ref 20.0–24.0)
Drawn by: 330991
MECHVT: 600 mL
Patient temperature: 98.6
TCO2: 24.7 mmol/L (ref 0–100)
pH, Arterial: 7.41 (ref 7.350–7.450)

## 2012-09-23 LAB — PREPARE PLATELET PHERESIS: Unit division: 0

## 2012-09-23 LAB — HEMOGLOBIN AND HEMATOCRIT, BLOOD: HCT: 25.1 % — ABNORMAL LOW (ref 39.0–52.0)

## 2012-09-23 LAB — GLUCOSE, CAPILLARY
Glucose-Capillary: 98 mg/dL (ref 70–99)
Glucose-Capillary: 104 mg/dL — ABNORMAL HIGH (ref 70–99)
Glucose-Capillary: 89 mg/dL (ref 70–99)

## 2012-09-23 LAB — BASIC METABOLIC PANEL
BUN: 18 mg/dL (ref 6–23)
BUN: 18 mg/dL (ref 6–23)
Calcium: 7.1 mg/dL — ABNORMAL LOW (ref 8.4–10.5)
Creatinine, Ser: 0.65 mg/dL (ref 0.50–1.35)
GFR calc Af Amer: >90 mL/min (ref >90)
GFR calc non Af Amer: >90 mL/min (ref >90)
GFR calc non Af Amer: >90 mL/min (ref >90)
Glucose, Bld: 99 mg/dL (ref 70–99)
Potassium: 3.6 mEq/L (ref 3.5–5.1)
Sodium: 139 mEq/L (ref 135–145)

## 2012-09-23 LAB — PREPARE FRESH FROZEN PLASMA: Unit division: 0

## 2012-09-23 LAB — POCT I-STAT 3, ART BLOOD GAS (G3+)
Bicarbonate: 23.3 mEq/L (ref 20.0–24.0)
Patient temperature: 36.4
TCO2: 24 mmol/L (ref 0–100)

## 2012-09-23 LAB — CK TOTAL AND CKMB (NOT AT ARMC)
CK, MB: 5.1 ng/mL — ABNORMAL HIGH (ref 0.3–4.0)
Total CK: 1462 U/L — ABNORMAL HIGH (ref 7–232)

## 2012-09-23 LAB — PROTIME-INR: INR: 1.23 (ref 0.00–1.49)

## 2012-09-23 LAB — MAGNESIUM: Magnesium: 2.1 mg/dL (ref 1.5–2.5)

## 2012-09-23 MED ORDER — POTASSIUM CHLORIDE 10 MEQ/50ML IV SOLN
10.0000 meq | INTRAVENOUS | Status: AC
  Administered 2012-09-23 (×2): 10 meq via INTRAVENOUS
  Filled 2012-09-23: qty 100

## 2012-09-23 MED ORDER — CHLORHEXIDINE GLUCONATE 0.12 % MT SOLN
15.0000 mL | Freq: Two times a day (BID) | OROMUCOSAL | Status: DC
  Administered 2012-09-23 – 2012-10-07 (×26): 15 mL via OROMUCOSAL
  Filled 2012-09-23 (×34): qty 15

## 2012-09-23 MED ORDER — IPRATROPIUM BROMIDE 0.02 % IN SOLN: 0.5000 mg | RESPIRATORY_TRACT | Status: DC | PRN

## 2012-09-23 MED ORDER — POTASSIUM CHLORIDE 10 MEQ/50ML IV SOLN
INTRAVENOUS | Status: AC
  Administered 2012-09-23: 10 meq
  Filled 2012-09-23: qty 50

## 2012-09-23 MED ORDER — FOLIC ACID 5 MG/ML IJ SOLN
1.0000 mg | Freq: Every day | INTRAMUSCULAR | Status: DC
  Administered 2012-09-23 – 2012-09-26 (×4): 1 mg via INTRAVENOUS
  Filled 2012-09-23 (×5): qty 0.2

## 2012-09-23 MED ORDER — SODIUM CHLORIDE 0.9 % IJ SOLN
10.0000 mL | Freq: Two times a day (BID) | INTRAMUSCULAR | Status: DC
  Administered 2012-09-23: 10 mL
  Administered 2012-09-24: 20 mL
  Administered 2012-09-24 – 2012-10-03 (×11): 10 mL
  Administered 2012-10-04: 20 mL
  Administered 2012-10-04 – 2012-10-06 (×4): 10 mL

## 2012-09-23 MED ORDER — THIAMINE HCL 100 MG/ML IJ SOLN
100.0000 mg | Freq: Every day | INTRAMUSCULAR | Status: DC
  Administered 2012-09-23 – 2012-09-26 (×4): 100 mg via INTRAVENOUS
  Filled 2012-09-23 (×5): qty 1

## 2012-09-23 MED ORDER — SODIUM CHLORIDE 0.9 % IJ SOLN
10.0000 mL | INTRAMUSCULAR | Status: DC | PRN
  Administered 2012-09-23 – 2012-10-02 (×5): 10 mL

## 2012-09-23 MED ORDER — ALBUTEROL SULFATE (5 MG/ML) 0.5% IN NEBU: 2.5000 mg | INHALATION_SOLUTION | RESPIRATORY_TRACT | Status: DC | PRN

## 2012-09-23 MED ORDER — CLINIMIX E/DEXTROSE (5/15) 5 % IV SOLN
INTRAVENOUS | Status: DC
  Filled 2012-09-23: qty 2000

## 2012-09-23 MED ORDER — DEXTROSE-NACL 5-0.9 % IV SOLN: INTRAVENOUS | Status: DC

## 2012-09-23 MED ORDER — CLINIMIX E/DEXTROSE (5/15) 5 % IV SOLN
INTRAVENOUS | Status: AC
  Administered 2012-09-23: 18:00:00 via INTRAVENOUS
  Filled 2012-09-23: qty 1000

## 2012-09-23 MED ORDER — BIOTENE DRY MOUTH MT LIQD
15.0000 mL | Freq: Four times a day (QID) | OROMUCOSAL | Status: DC
  Administered 2012-09-23 – 2012-10-08 (×54): 15 mL via OROMUCOSAL

## 2012-09-23 MED ORDER — DEXTROSE-NACL 5-0.9 % IV SOLN
INTRAVENOUS | Status: AC
  Administered 2012-09-23 (×2): via INTRAVENOUS

## 2012-09-23 NOTE — Progress Notes (Signed)
PT Cancellation Note  Patient Details Name: Charles [name] MRN: [id number] DOB: May 27, 1946   Cancelled Treatment:    Reason Eval/Treat Not Completed: Other (comment);Medical issues which prohibited therapy (MD:  Please advise as to when PT eval is desired?  Thanks.)Received order however pt intubated and sedated.   INGOLD,[name] 09/23/2012, 1:51 PM [facility] [phone] (pager)

## 2012-09-23 NOTE — Consult Note (Signed)
PULMONARY  / CRITICAL CARE MEDICINE  Name: Charles [name] MRN: [id number] DOB: 1945-08-12    ADMISSION DATE:  09/21/2012 CONSULTATION DATE:  09/22/12  REFERRING MD :  Darrick Penna PRIMARY SERVICE: Fields  CHIEF COMPLAINT:  Post AAA repair  BRIEF PATIENT DESCRIPTION:  67yo male smoker, ETOH tx from [place] 4/22 with ruptured AAA and Afib. Taken emergently to OR on arrival to [facility].  Pt remained vented post op with open abd and PCCM consulted for vent/medical mgmt.   SIGNIFICANT EVENTS:  4/23 OR -- AAA repair, Aorto left common iliac right common femoral bypass,aortic endarterectomy, right femoral endarterectomy profundaplasty, placement abdominal VAC 4/24 Off pressors, tolerating pressure support  STUDIES: 4/23 CT abd/pelvis>>> rupturing AAA  LINES / TUBES: ETT 4/23>>> R IJ PA cath 4/23>>> R radial artery 4/23>>>  CULTURES: blooc cultures x 2 4/23 >>> GNR >>> Urine 4/23>>> GNR >>>  ANTIBIOTICS: Ancef 4/22>>>4/23 Zosyn 4/23>>>  SUBJECTIVE: Tolerating pressure support.  Off pressors.  VITAL SIGNS: Temp:  [95.2 F (35.1 C)-98.1 F (36.7 C)] 98.1 F (36.7 C) (04/24 0700) Pulse Rate:  [49-104] 49 (04/24 0700) Resp:  [9-20] 16 (04/24 0700) BP: (96-166)/(50-80) 113/58 mmHg (04/24 0700) SpO2:  [91 %-100 %] 100 % (04/24 0700) Arterial Line BP: (100-191)/(52-80) 113/59 mmHg (04/24 0700) FiO2 (%):  [50 %-100 %] 50 % (04/24 0700) HEMODYNAMICS: PAP: (32-62)/(17-31) 38/19 mmHg CO:  [3.3 L/min-6.9 L/min] 4.8 L/min CI:  [2.2 L/min/m2-4.7 L/min/m2] 3.3 L/min/m2 VENTILATOR SETTINGS: Vent Mode:  [-] PRVC FiO2 (%):  [50 %-100 %] 50 % Set Rate:  [16 bmp] 16 bmp Vt Set:  [600 mL] 600 mL PEEP:  [5 cmH20] 5 cmH20 Plateau Pressure:  [19 cmH20-25 cmH20] 22 cmH20 INTAKE / OUTPUT: Intake/Output     04/23 0701 - 04/24 0700 04/24 0701 - 04/25 0700   P.O.     I.V. (mL/kg) 2402.3 (53)    Blood 1136    Other     NG/GT 90    IV Piggyback 350    Total Intake(mL/kg) 3978.3 (87.8)     Urine (mL/kg/hr) 1755 (1.6)    Emesis/NG output 50 (0)    Drains 3775 (3.5)    Blood     Total Output 5580     Net -1601.7          Stool Occurrence 1 x      PHYSICAL EXAMINATION: General: No distress Neuro: Sedated, awakens easily HEENT: ETT in place Cardiovascular: regular Lungs: prolonged exhalation, no wheeze Abdomen: distended, wound vac in place Ext: no edema  LABS:  Recent Labs Lab 09/21/12 2214  09/22/12 0845  09/22/12 0912 09/22/12 1149 09/22/12 1200 09/22/12 1443 09/22/12 1600 09/22/12 1805 09/22/12 1955 09/22/12 2000 09/23/12 0400 09/23/12 0411  HGB 10.9*  < > 9.3*  --   --   --  9.1*  --  8.8*  --  8.9*  --  8.8*  --   WBC 12.9*  --  13.4*  --   --   --   --   --   --   --   --   --  10.2  --   PLT 150  --  80*  --   --   --   --   --   --   --   --   --  64*  --   NA 121*  < > 135  --   --   --  136  --   --  133*  --   --  136  --   K 3.5  < > 3.5  --   --   --  3.4*  --   --  3.6  --   --  3.5  --   CL 82*  --  100  --   --   --  102  --   --  101  --   --  104  --   CO2 27  --  23  --   --   --  23  --   --  25  --   --  25  --   GLUCOSE 133*  < > 157*  --   --   --  148*  --   --  99  --   --  102*  --   BUN 12  --  11  --   --   --  12  --   --  14  --   --  17  --   CREATININE 0.44*  --  0.49*  --   --   --  0.52  --   --  0.61  --   --  0.66  --   CALCIUM 8.3*  --  6.6*  --   --   --  6.5*  --   --  6.7*  --   --  6.6*  --   MG 1.6  --  1.2*  --   --   --   --   --   --   --   --   --  2.1  --   AST 75*  --  57*  --   --   --   --   --   --   --   --   --  93*  --   ALT 40  --  27  --   --   --   --   --   --   --   --   --  35  --   ALKPHOS 76  --  63  --   --   --   --   --   --   --   --   --  39  --   BILITOT 0.9  --  1.9*  --   --   --   --   --   --   --   --   --  0.7  --   PROT 6.6  --  4.8*  --   --   --   --   --   --   --   --   --  4.2*  --   ALBUMIN 2.4*  --  2.4*  --   --   --   --   --   --   --   --   --  1.7*  --   APTT  --    --  39*  --   --   --   --   --   --   --   --   --   --   --   INR  --   --  1.41  --   --   --  1.32  --   --   --   --   --  1.23  --   TROPONINI  --   --  <0.30  --   --   --   --  <  0.30  --   --   --  <0.30  --   --   PHART  --   < >  --   < > 7.289* 7.410  --   --   --   --   --   --   --  7.463*  PCO2ART  --   < >  --   < > 45.4* 37.8  --   --   --   --   --   --   --  32.4*  PO2ART  --   < >  --   < > 116.0* PENDING  --   --   --   --   --   --   --  91.0  < > = values in this interval not displayed.  Recent Labs Lab 09/22/12 1207 09/22/12 1620 09/22/12 2038 09/22/12 2357 09/23/12 0411  GLUCAP 136* 112* 94 91 106*    CXR:  Dg Chest 2 View  09/21/2012  *RADIOLOGY REPORT*  Clinical Data: Weakness and weight loss  CHEST - 2 VIEW  Comparison: 10/24/2004  Findings: The heart and pulmonary vascularity are within normal limits.  Some chronic changes are noted in the right lung.  No focal infiltrate or sizable effusion is seen.  Mild hyperinflation is noted.  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   Original Report Authenticated By: Alcide Clever, M.D.    Ct Abdomen Pelvis W Contrast  09/21/2012  *RADIOLOGY REPORT*  Clinical Data: Abdominal pain and fever.  CT ABDOMEN AND PELVIS WITH CONTRAST  Technique:  Multidetector CT imaging of the abdomen and pelvis was performed following the standard protocol during bolus administration of intravenous contrast.  Contrast: 50mL OMNIPAQUE IOHEXOL 300 MG/ML  SOLN, OMNIPAQUE IOHEXOL 300 MG/ML  SOLN  Comparison: None.  Findings: The patient has a rupturing abdominal aortic aneurysm. The aneurysm has ruptured into the left psoas muscle with the hemorrhage extending down the left psoas muscle into the left iliopsoas muscle.  The rupture is contained within the retroperitoneum at this time.  There is a mass effect upon the left kidney and upon the adjacent bowel.  There is a pseudoaneurysm containing contrast at the site of the rupture. The back wall  of the abdominal aorta is blown out asymmetric to the left 18 mm below the left renal artery.  There is a pseudoaneurysm extending laterally.  The liver appears normal except for dilatation of intra and extrahepatic bile ducts.  Common bile duct is dilated to a diameter of 8 mm.  There is no visible stone or mass in the distal bile duct. The spleen, pancreas, and adrenal glands are normal.  The kidneys are normal except for the mass effect on the left kidney by the retroperitoneal hemorrhage.  There are no dilated loops of large or small bowel.  There is moderate free fluid in the pelvis, nonspecific. There is a tiny left pleural effusion.  No acute osseous abnormality.  IMPRESSION: 1.  Rupturing abdominal aortic aneurysm, currently contained in the retroperitoneum extending into the left iliopsoas muscle. 2.  Free fluid in the abdomen, nonspecific.  Critical Value/emergent results were called by telephone at the time of interpretation on 09/21/2012 at 10:00 p.m. to Dr. Orvan Falconer, who verbally acknowledged these results.   Original Report Authenticated By: Francene Boyers, M.D.    Dg Chest Portable 1 View  09/22/2012  *RADIOLOGY REPORT*  Clinical Data: Postoperative, line placement  PORTABLE CHEST - 1 VIEW  Comparison: Portable exam  0912 hours compared to 09/21/2012  Findings: Tip of endotracheal tube 3.4 cm above carina. Nasogastric tube extends into stomach. Right jugular Theone Murdoch catheter tip projecting over proximal right pulmonary artery.  Normal heart size. Atherosclerotic calcification aorta. Diffuse emphysematous changes with new perihilar infiltrates likely pulmonary edema. Right upper lobe bullous disease. No definite pleural effusion or pneumothorax. Bones demineralized. Extensive bilateral calcified carotid plaque.  IMPRESSION: Line and tube positions as above. Changes of COPD with right apical bullous disease and superimposed perihilar infiltrates likely edema.   Original Report Authenticated By: Ulyses Southward, M.D.      ASSESSMENT / PLAN:  PULMONARY A:  Acute respiratory failure in setting of AAA. Hx of smoking and ?COPD.  P:   - pressure support wean as tolerated >> not ready for extubation -change BD's to prn -f/u CXR  CARDIOVASCULAR A:  Ruptured AAA s/p repair 4/23. PAF >> back to sinus 4/23. Hemorrhagic shock >> resolved. Hx of HTN, PVD.  P:  - goal even fluid balance - post-op care per VVS - defer decision to d/c aline/PA catheter to VVS >> not needed from PCCM standpoint - PRN hydralazine, lopressor for HTN - f/u Echo  RENAL A:   Hypokalemia.  P:   - f/u and replace electrolytes as needed - monitor renal fx, urine outpt  GASTROINTESTINAL A:   Severe protein calorie malnutrition.  P:   - add dextrose to IV fluids - defer advancing nutrition to VVS  HEMATOLOGIC A:  Acute blood loss anemia from AAA rupture, and critical illness. Thrombocytopenia >> likely from critical illness and hx of ETOH. P:  - f/u CBC - transfuse for Hb < 7 or active bleeding  INFECTIOUS A:  GNR in urine and blood cultures. P:   - continue zosyn - f/u culture results  ENDOCRINE A:  Hyperglycemia >> likely 2nd to acute stress. HbA1C 5.8 from 4/23. P:   - SSI   NEUROLOGIC A:  Sedation. Hx of ETOH. P:   - Continuous sedation protocol - Daily WUA - add thiamine, folic acid  CC time 40 minutes.  Coralyn Helling, MD [facility] Pulmonary/Critical Care 09/23/2012, 8:03 AM Pager:  [phone] After 3pm call: [phone]

## 2012-09-23 NOTE — Procedures (Signed)
Central Venous Catheter Insertion Procedure Note HODGE STACHNIK [id number] 09/04/1945  Procedure: Insertion of Central Venous Catheter Indications: Assessment of intravascular volume and Drug and/or fluid administration  Procedure Details Consent: Risks of procedure as well as the alternatives and risks of each were explained to the (patient/caregiver).  Consent for procedure obtained. Time Out: Verified patient identification, verified procedure, site/side was marked, verified correct patient position, special equipment/implants available, medications/allergies/relevent history reviewed, required imaging and test results available.  Performed  Maximum sterile technique was used including antiseptics, cap, gloves, gown, hand hygiene, mask and sheet. Skin prep: Chlorhexidine; local anesthetic administered A antimicrobial bonded/coated triple lumen catheter was placed in the right internal jugular vein over a wire inserted into previous R IJ cordis.  Cordis removed and CVL placed.   Evaluation Blood flow good Complications: No apparent complications Patient did tolerate procedure well. Chest X-ray ordered to verify placement.  CXR: pending.  Danford Bad, NP 09/23/2012, 2:15 PM  Coralyn Helling, MD [place] Pulmonary/Critical Care  Pager:  [phone] After 3pm call: [phone]

## 2012-09-23 NOTE — Significant Event (Signed)
Latest potassium level of 3.6. Will give two runs of KCL per PRN order set.

## 2012-09-23 NOTE — Significant Event (Signed)
Spoke with Dr. Darrick Penna regarding order for placement of Panda and whether to remove NG after panda placement. Per MD, not to remove NG and that panda can be placed tomorrow instead. Will continue to monitor. Ortencia Askari, Charity fundraiser.

## 2012-09-23 NOTE — Progress Notes (Addendum)
[facility] [place]  Post-op  Intra-abdominal Surgery note  Date of Surgery: 09/21/2012 - 09/22/2012  Surgeon(s): Sherren Kerns, MD Sherren Kerns, MD  2 Days Post-Op Procedure(s): ANEURYSM ABDOMINAL AORTIC REPAIR EMBOLECTOMY PATCH ANGIOPLASTY  History of Present Illness  Charles [name] is a 67 y.o. male who is  up s/p Procedure(s): ANEURYSM ABDOMINAL AORTIC REPAIR EMBOLECTOMY right femoral and profunda fem arteries PATCH ANGIOPLASTY right CFA. Pt is stable and sedated on vent.  Significant Diagnostic Studies: CBC Lab Results  Component Value Date   WBC 10.2 09/23/2012   HGB 8.8* 09/23/2012   HCT 24.0* 09/23/2012   MCV 86.3 09/23/2012   PLT 64* 09/23/2012    BMET    Component Value Date/Time   NA 136 09/23/2012 0400   K 3.5 09/23/2012 0400   CL 104 09/23/2012 0400   CO2 25 09/23/2012 0400   GLUCOSE 102* 09/23/2012 0400   BUN 17 09/23/2012 0400   CREATININE 0.66 09/23/2012 0400   CALCIUM 6.6* 09/23/2012 0400   GFRNONAA >90 09/23/2012 0400   GFRAA >90 09/23/2012 0400    COAG Lab Results  Component Value Date   INR 1.23 09/23/2012   INR 1.32 09/22/2012   INR 1.41 09/22/2012   No results found for this basename: PTT    I/O last 3 completed shifts: In: 15817.3 [I.V.:11941.3; WUJWJ:1914; NG/GT:90; IV Piggyback:1100] Out: 7829 [FAOZH:0865; Emesis/NG output:50; Drains:3775; Blood:2600] Total I/O In: 50 [IV Piggyback:50] Out: -   Physical Examination BP Readings from Last 3 Encounters:  09/23/12 113/58  09/23/12 113/58  09/16/12 150/99   Temp Readings from Last 3 Encounters:  09/23/12 98.1 F (36.7 C)   09/23/12 98.1 F (36.7 C)   09/16/12 100.4 F (38 C) Oral   SpO2 Readings from Last 3 Encounters:  09/23/12 100%  09/23/12 100%  09/16/12 95%   Pulse Readings from Last 3 Encounters:  09/23/12 49  09/23/12 49  09/16/12 59    General:sedated on vent, Pulmonary: comfortable on vent Cardiac: Heart rate : irregular ,   Abdomen:abdomen soft, non-tender and normal active bowel sounds Abdominal wound:clean, dry, intact  Neurologic: A&O X 3; Appropriate Affect ;  Speech is fluent/normal  Vascular Exam:BLE warm and well perfused Extremities without ischemic changes, no Gangrene, no cellulitis; no open wounds;   LOWER EXTREMITY PULSES           RIGHT                                      LEFT      POSTERIOR TIBIAL  monophasic by Doppler   monophasic by Doppler        DORSALIS PEDIS      ANTERIOR TIBIAL  monophasic by Doppler   monophasic by Doppler    Assessment/Plan: Charles [name] is a 67 y.o. male who is 2 Days Post-Op   Aorto to left common iliac and right common femoral artery bypass to repair ruptured infrarenal abdominal aortic aneurysm, aortic endarterectomy, right common femoral endarterectomy with profundoplasty, ligation of inferior mesenteric artery right external iliac artery and right internal iliac artery  pt sedate on vent VSS A.fib - well controlled on metoprolol Renal function -WNL Wound vac drainage less overnight Thrombocytopenia - watch - may need PLTs, if bleeding ETOH abuse - thiamine folate in IVF, sedated Acute on chronic blood loss anemia- some dilution will watch CPK sl more elevated  today as expected - will watch labs for ischemic bowel Will recheck abdomen poss tomorrow  Marlowe Shores [phone] 09/23/2012 7:50 AM   Remarkably stable overnight Weaning off vent Awake opens eyes moves extremities Abdomen still large amount of edema Feet bilat doppler signals  Start TPN today Place nasoenteric feeding tube Back to OR tomorrow for VAC change and line change Continue antibiotics while abdomen open and cultures pending Severe protein calorie nutrition hopefully start tube feeds tomorrow Respiratory and gut failure but other systems currently working Afib rate control  Fabienne Bruns, MD [facility] [place] Office: [phone] Pager:  [phone]

## 2012-09-23 NOTE — Significant Event (Signed)
1600pm-spoken with Dr. Herma Carson in [facility] regarding patient's latest x-ray and whether Triple Lumen central line can be used for infusions. Received verbal order to use central line. Robbi Spells, Charity fundraiser.

## 2012-09-23 NOTE — Progress Notes (Signed)
PARENTERAL NUTRITION CONSULT NOTE - INITIAL  Pharmacy Consult for TPN Indication: s/p AAA repair with open abdomen;  2 month hx of poor po intake with significant weight loss  No Known Allergies  Patient Measurements: Height: 5\' 4"  (162.6 cm) Weight: 99 lb 13.9 oz (45.3 kg) IBW/kg (Calculated) : 59.2 Adjusted Body Weight:  Usual Weight: 59 kg  Vital Signs: Temp: 98.2 F (36.8 C) (04/24 0800) Temp src: Core (Comment) (04/24 0800) BP: 118/60 mmHg (04/24 0800) Pulse Rate: 77 (04/24 0800) Intake/Output from previous day: 04/23 0701 - 04/24 0700 In: 3978.3 [I.V.:2402.3; Blood:1136; NG/GT:90; IV Piggyback:350] Out: 5580 [Urine:1755; Emesis/NG output:50; Drains:3775] Intake/Output from this shift: Total I/O In: 151.5 [I.V.:101.5; IV Piggyback:50] Out: 200 [Urine:50; Drains:150]  Labs:  Recent Labs  09/21/12 2214  09/22/12 0845 09/22/12 1200 09/22/12 1600 09/22/12 1955 09/23/12 0400  WBC 12.9*  --  13.4*  --   --   --  10.2  HGB 10.9*  < > 9.3* 9.1* 8.8* 8.9* 8.8*  HCT 30.5*  < > 26.1* 25.1* 23.8* 24.3* 24.0*  PLT 150  --  80*  --   --   --  64*  APTT  --   --  39*  --   --   --   --   INR  --   --  1.41 1.32  --   --  1.23  < > = values in this interval not displayed.   Recent Labs  09/21/12 2214  09/22/12 0845 09/22/12 1200 09/22/12 1805 09/23/12 0400  NA 121*  < > 135 136 133* 136  K 3.5  < > 3.5 3.4* 3.6 3.5  CL 82*  --  100 102 101 104  CO2 27  --  23 23 25 25   GLUCOSE 133*  < > 157* 148* 99 102*  BUN 12  --  11 12 14 17   CREATININE 0.44*  --  0.49* 0.52 0.61 0.66  CALCIUM 8.3*  --  6.6* 6.5* 6.7* 6.6*  MG 1.6  --  1.2*  --   --  2.1  PROT 6.6  --  4.8*  --   --  4.2*  ALBUMIN 2.4*  --  2.4*  --   --  1.7*  AST 75*  --  57*  --   --  93*  ALT 40  --  27  --   --  35  ALKPHOS 76  --  63  --   --  39  BILITOT 0.9  --  1.9*  --   --  0.7  < > = values in this interval not displayed. Estimated Creatinine Clearance: 56.6 ml/min (by C-G formula based on  Cr of 0.66).    Recent Labs  09/22/12 2357 09/23/12 0411 09/23/12 0744  GLUCAP 91 106* 98    Medical History: Past Medical History  Diagnosis Date  . Hypercholesteremia   . Collapsed lung     s/p fall, rib fracture    Medications:  Scheduled:  . antiseptic oral rinse  15 mL Mouth Rinse QID  . chlorhexidine  15 mL Mouth Rinse BID  . [COMPLETED] fentaNYL  100 mcg Intravenous Once  . folic acid  1 mg Intravenous Daily  . insulin aspart  0-15 Units Subcutaneous Q4H  . metoprolol  5 mg Intravenous Q6H  . [COMPLETED] midazolam  2 mg Intravenous Once  . pantoprazole (PROTONIX) IV  40 mg Intravenous QHS  . piperacillin-tazobactam (ZOSYN)  IV  3.375 g Intravenous Q8H  . [  COMPLETED] potassium chloride  10 mEq Intravenous Q1 Hr x 2  . [COMPLETED] potassium chloride  10 mEq Intravenous Q1 Hr x 2  . thiamine  100 mg Intravenous Daily  . [DISCONTINUED] albuterol  2.5 mg Nebulization Q4H  . [DISCONTINUED] ipratropium  0.5 mg Nebulization Q4H  . [DISCONTINUED] Ipratropium-Albuterol  6 puff Inhalation Q4H  . [DISCONTINUED] midazolam  2 mg Intravenous Once    Insulin Requirements in the past 24 hours:  0 units   Current Nutrition:  NPO   Nutritional Goals:  1350-1500 kCal, 80-90 grams of protein per day  Assessment: Admit:  Pt presented to [facility] with 2 month hx of poor PO intake and weight loss due to mouth pain, abdominal pain, and low back pain.  Down to 99# from usual weight of 120#.  Found to have ruptured AAA, AFib and transferred to [facility].  Now s/p AAA repair pod#2- Aorto left common iliac right common femoral bypass,aortic endarterectomy, right femoral endarterectomy profundaplasty, placement abdominal VAC  GI:  Abd soft and non-tender.  Remains NPO.  CPK sl more elevated, watching for signs of ischemic bowel.  NG tube to be placed for TF, hopes to start tomorrow.  Open abdomen with plan for return to OR 4/25 for VAC change.  PPI-IV  Endo:  No hx DM.  Serum glucose 102, on  moderate SSI q4 and requiring no insulin  Lytes:  Lytes wnl.  IVF = D5NS at 148ml/hr  Renal:   No hx renal dz, Cr < 1 and uop 1.74ml/kg/hr  Pulm:  COPD, heavy smoker.    Cards:  AFib, controlled on Metoprolol.  Hemorrhagic shock resolved.   Hepatobil:  Hx EtOH abuse, thiamine and folic acid daily  Neuro:  Fentanyl and Versed drips, weaning.  ID:   Zosyn #2 for GNR UTI, GNR bacteremia.  Best Practices:  SCDs, mouth care  TPN Access: R-IJ TPN day#: 1  Plan:  -  Clinimix 5/15-E (with electrolytes) at 62ml/hr -  Watch for refeeding syndrome -  TPN labs in AM, and every Mon/Thurs after that while remains on TPN -  Continue SSI as ordered -  Intralipid every Mon/Fri to prevent EFAD, due to national shortage -  MVI and Trace elements every MWF, due to national shortage  Marisue Humble, PharmD Clinical Pharmacist [facility]

## 2012-09-23 NOTE — Significant Event (Signed)
Received verbal orders from Dr. Darrick Penna at bedside to wean Cardizem to off first, then wean dopamine to off. Patient's BP trending up to 160-180 SBP, HR to 99-105 Sinus, with currently Cardizem @ 5 mg and dopamine @ 3 mcg. Drips turned off. Once drips were off, patient's BP started to trend down to 120s-130s. Pt is being monitored closely. Flornce Record, Charity fundraiser.

## 2012-09-23 NOTE — Significant Event (Signed)
1200pm-Clarified whether TPN can be administered via introducer as there are orders for TPN to start this evening. Spoken with pharmacy and Vascular team who administer TPN, Rhea, who stated that central line for TPN should be in SVC. Currently, the Right IJ Introducer that patient has is not long enough for TPN administration. Notified Vascular on call and spoken directly with Marisue Humble and with CCM Dr. Craige Cotta.

## 2012-09-24 ENCOUNTER — Inpatient Hospital Stay (HOSPITAL_COMMUNITY): Payer: Medicare Other

## 2012-09-24 ENCOUNTER — Encounter (HOSPITAL_COMMUNITY): Payer: Self-pay | Admitting: Anesthesiology

## 2012-09-24 ENCOUNTER — Inpatient Hospital Stay (HOSPITAL_COMMUNITY): Payer: Medicare Other | Admitting: Anesthesiology

## 2012-09-24 ENCOUNTER — Encounter (HOSPITAL_COMMUNITY)
Admission: EM | Disposition: A | Discharge status: Other Acute Inpatient Hospital | Payer: Self-pay | Source: Home / Self Care | Attending: Vascular Surgery

## 2012-09-24 DIAGNOSIS — IMO0002 Reserved for concepts with insufficient information to code with codable children: Secondary | ICD-10-CM

## 2012-09-24 DIAGNOSIS — R7881 Bacteremia: Secondary | ICD-10-CM

## 2012-09-24 HISTORY — PX: WOUND DEBRIDEMENT: SHX247

## 2012-09-24 LAB — CBC
HCT: 24 % — ABNORMAL LOW (ref 39.0–52.0)
MCH: 31.3 pg (ref 26.0–34.0)
MCV: 88.2 fL (ref 78.0–100.0)
Platelets: 67 10*3/uL — ABNORMAL LOW (ref 150–400)
RDW: 15.7 % — ABNORMAL HIGH (ref 11.5–15.5)

## 2012-09-24 LAB — COMPREHENSIVE METABOLIC PANEL
AST: 88 U/L — ABNORMAL HIGH (ref 0–37)
Albumin: 1.5 g/dL — ABNORMAL LOW (ref 3.5–5.2)
BUN: 19 mg/dL (ref 6–23)
CO2: 27 mEq/L (ref 19–32)
Calcium: 7.4 mg/dL — ABNORMAL LOW (ref 8.4–10.5)
Chloride: 109 mEq/L (ref 96–112)
Creatinine, Ser: 0.64 mg/dL (ref 0.50–1.35)
GFR calc non Af Amer: >90 mL/min (ref >90)
Total Bilirubin: 0.5 mg/dL (ref 0.3–1.2)

## 2012-09-24 LAB — POCT I-STAT 7, (LYTES, BLD GAS, ICA,H+H)
Acid-base deficit: 8 mmol/L — ABNORMAL HIGH (ref 0.0–2.0)
Calcium, Ion: 0.63 mmol/L — CL (ref 1.13–1.30)
HCT: 25 % — ABNORMAL LOW (ref 39.0–52.0)
Potassium: 3.5 mEq/L (ref 3.5–5.1)
pO2, Arterial: 215 mmHg — ABNORMAL HIGH (ref 80.0–100.0)

## 2012-09-24 LAB — GLUCOSE, CAPILLARY
Glucose-Capillary: 118 mg/dL — ABNORMAL HIGH (ref 70–99)
Glucose-Capillary: 122 mg/dL — ABNORMAL HIGH (ref 70–99)
Glucose-Capillary: 128 mg/dL — ABNORMAL HIGH (ref 70–99)
Glucose-Capillary: 149 mg/dL — ABNORMAL HIGH (ref 70–99)

## 2012-09-24 LAB — DIFFERENTIAL
Basophils Absolute: 0 10*3/uL (ref 0.0–0.1)
Basophils Relative: 0 % (ref 0–1)
Eosinophils Relative: 0 % (ref 0–5)
Lymphocytes Relative: 12 % (ref 12–46)
Monocytes Absolute: 0.5 10*3/uL (ref 0.1–1.0)

## 2012-09-24 LAB — POCT I-STAT 3, ART BLOOD GAS (G3+)
Acid-base deficit: 2 mmol/L (ref 0.0–2.0)
Bicarbonate: 22 mEq/L (ref 20.0–24.0)
O2 Saturation: 87 %
Patient temperature: 98.6
TCO2: 23 mmol/L (ref 0–100)

## 2012-09-24 LAB — CK TOTAL AND CKMB (NOT AT ARMC): Total CK: 815 U/L — ABNORMAL HIGH (ref 7–232)

## 2012-09-24 LAB — HEMOGLOBIN AND HEMATOCRIT, BLOOD
HCT: 26.7 % — ABNORMAL LOW (ref 39.0–52.0)
Hemoglobin: 9.6 g/dL — ABNORMAL LOW (ref 13.0–17.0)

## 2012-09-24 LAB — PHOSPHORUS: Phosphorus: 1.9 mg/dL — ABNORMAL LOW (ref 2.3–4.6)

## 2012-09-24 LAB — PREALBUMIN: Prealbumin: 3.9 mg/dL — ABNORMAL LOW (ref 17.0–34.0)

## 2012-09-24 LAB — MAGNESIUM: Magnesium: 2.3 mg/dL (ref 1.5–2.5)

## 2012-09-24 SURGERY — IRRIGATION AND DEBRIDEMENT, WOUND, ABDOMEN, WITH CLOSURE
Anesthesia: General | Wound class: Contaminated

## 2012-09-24 MED ORDER — PHENYLEPHRINE HCL 10 MG/ML IJ SOLN
INTRAMUSCULAR | Status: DC | PRN
  Administered 2012-09-24 (×2): 40 ug via INTRAVENOUS
  Administered 2012-09-24 (×5): 80 ug via INTRAVENOUS

## 2012-09-24 MED ORDER — SODIUM CHLORIDE 0.9 % IV SOLN
INTRAVENOUS | Status: AC
  Administered 2012-09-24: 08:00:00 via INTRAVENOUS

## 2012-09-24 MED ORDER — ROCURONIUM BROMIDE 100 MG/10ML IV SOLN
INTRAVENOUS | Status: DC | PRN
  Administered 2012-09-24: 10 mg via INTRAVENOUS
  Administered 2012-09-24 (×2): 20 mg via INTRAVENOUS

## 2012-09-24 MED ORDER — TRACE MINERALS CR-CU-F-FE-I-MN-MO-SE-ZN IV SOLN
INTRAVENOUS | Status: AC
  Administered 2012-09-24: 17:00:00 via INTRAVENOUS
  Filled 2012-09-24: qty 2000

## 2012-09-24 MED ORDER — ARTIFICIAL TEARS OP OINT
TOPICAL_OINTMENT | OPHTHALMIC | Status: DC | PRN
  Administered 2012-09-24: 1 via OPHTHALMIC

## 2012-09-24 MED ORDER — FENTANYL CITRATE 0.05 MG/ML IJ SOLN
INTRAMUSCULAR | Status: DC | PRN
  Administered 2012-09-24 (×5): 50 ug via INTRAVENOUS

## 2012-09-24 MED ORDER — FAT EMULSION 20 % IV EMUL
216.0000 mL | INTRAVENOUS | Status: AC
  Administered 2012-09-24: 216 mL via INTRAVENOUS
  Filled 2012-09-24: qty 250

## 2012-09-24 MED ORDER — LACTATED RINGERS IV SOLN
INTRAVENOUS | Status: DC | PRN
  Administered 2012-09-24: 11:00:00 via INTRAVENOUS

## 2012-09-24 MED ORDER — 0.9 % SODIUM CHLORIDE (POUR BTL) OPTIME
TOPICAL | Status: DC | PRN
  Administered 2012-09-24: 4000 mL

## 2012-09-24 MED ORDER — MIDAZOLAM HCL 5 MG/5ML IJ SOLN
INTRAMUSCULAR | Status: DC | PRN
  Administered 2012-09-24 (×2): 2 mg via INTRAVENOUS

## 2012-09-24 MED ORDER — POTASSIUM PHOSPHATE DIBASIC 3 MMOLE/ML IV SOLN
20.0000 mmol | Freq: Once | INTRAVENOUS | Status: AC
  Administered 2012-09-24: 20 mmol via INTRAVENOUS
  Filled 2012-09-24: qty 6.67

## 2012-09-24 MED ORDER — ALBUMIN HUMAN 5 % IV SOLN
INTRAVENOUS | Status: DC | PRN
  Administered 2012-09-24 (×2): via INTRAVENOUS

## 2012-09-24 MED FILL — Sodium Chloride Irrigation Soln 0.9%: Qty: 3000 | Status: AC

## 2012-09-24 MED FILL — Sodium Chloride IV Soln 0.9%: INTRAVENOUS | Qty: 1000 | Status: AC

## 2012-09-24 MED FILL — Heparin Sodium (Porcine) Inj 1000 Unit/ML: INTRAMUSCULAR | Qty: 30 | Status: AC

## 2012-09-24 SURGICAL SUPPLY — 34 items
CANISTER SUCTION 2500CC (MISCELLANEOUS) ×3 IMPLANT
CHLORAPREP W/TINT 26ML (MISCELLANEOUS) ×4 IMPLANT
CLOTH BEACON ORANGE TIMEOUT ST (SAFETY) ×3 IMPLANT
COVER SURGICAL LIGHT HANDLE (MISCELLANEOUS) ×3 IMPLANT
DRAPE LAPAROSCOPIC ABDOMINAL (DRAPES) ×2 IMPLANT
DRSG COVADERM 4X14 (GAUZE/BANDAGES/DRESSINGS) ×3 IMPLANT
DRSG COVADERM 4X6 (GAUZE/BANDAGES/DRESSINGS) ×2 IMPLANT
DRSG VAC ATS LRG SENSATRAC (GAUZE/BANDAGES/DRESSINGS) IMPLANT
DRSG VAC ATS MED SENSATRAC (GAUZE/BANDAGES/DRESSINGS) IMPLANT
ELECT REM PT RETURN 9FT ADLT (ELECTROSURGICAL) ×3
ELECTRODE REM PT RTRN 9FT ADLT (ELECTROSURGICAL) ×2 IMPLANT
FLUID NSS /IRRIG 3000 ML XXX (IV SOLUTION) IMPLANT
GLOVE BIO SURGEON STRL SZ7.5 (GLOVE) ×6 IMPLANT
GLOVE BIOGEL PI IND STRL 6.5 (GLOVE) ×1 IMPLANT
GLOVE BIOGEL PI INDICATOR 6.5 (GLOVE) ×1
GLOVE SURG SS PI 6.5 STRL IVOR (GLOVE) ×3 IMPLANT
GOWN PREVENTION PLUS XLARGE (GOWN DISPOSABLE) ×3 IMPLANT
GOWN STRL NON-REIN LRG LVL3 (GOWN DISPOSABLE) ×6 IMPLANT
HANDPIECE INTERPULSE COAX TIP (DISPOSABLE)
KIT BASIN OR (CUSTOM PROCEDURE TRAY) ×3 IMPLANT
KIT ROOM TURNOVER OR (KITS) ×3 IMPLANT
NS IRRIG 1000ML POUR BTL (IV SOLUTION) ×3 IMPLANT
PACK GENERAL/GYN (CUSTOM PROCEDURE TRAY) ×3 IMPLANT
PACK UNIVERSAL I (CUSTOM PROCEDURE TRAY) IMPLANT
PAD ARMBOARD 7.5X6 YLW CONV (MISCELLANEOUS) ×6 IMPLANT
PAD NEG PRESSURE SENSATRAC (MISCELLANEOUS) ×1 IMPLANT
RETAINER VISCERA MED (MISCELLANEOUS) ×2 IMPLANT
SET HNDPC FAN SPRY TIP SCT (DISPOSABLE) IMPLANT
STAPLER VISISTAT 35W (STAPLE) ×2 IMPLANT
SUCTION POOLE TIP (SUCTIONS) ×2 IMPLANT
SUT PDS AB 1 TP1 54 (SUTURE) ×2 IMPLANT
TOWEL OR 17X24 6PK STRL BLUE (TOWEL DISPOSABLE) ×3 IMPLANT
TOWEL OR 17X26 10 PK STRL BLUE (TOWEL DISPOSABLE) ×3 IMPLANT
WATER STERILE IRR 1000ML POUR (IV SOLUTION) IMPLANT

## 2012-09-24 NOTE — Anesthesia Procedure Notes (Signed)
Date/Time: 09/24/2012 10:50 AM Performed by: Luster Landsberg Pre-anesthesia Checklist: Patient identified, Emergency Drugs available, Suction available and Patient being monitored Oxygen Delivery Method: Circle system utilized Intubation Type: Inhalational induction with existing ETT Placement Confirmation: positive ETCO2 and breath sounds checked- equal and bilateral

## 2012-09-24 NOTE — OR Nursing (Signed)
On the way call made to SICU.

## 2012-09-24 NOTE — Significant Event (Signed)
1326pm-spoken with Dr. Craige Cotta regarding blood cultures (received a call from [facility] lab), that grew gram salmonella species. Per MD, continue with presents. Katiria Calame, Charity fundraiser.

## 2012-09-24 NOTE — Anesthesia Preprocedure Evaluation (Addendum)
Anesthesia Evaluation  Patient identified by MRN, date of birth, ID band Patient unresponsive    Reviewed: Allergy & Precautions, H&P , Patient's Chart, lab work & pertinent test results  Airway       Dental  (+) Poor Dentition   Pulmonary  + rhonchi         Cardiovascular + Peripheral Vascular Disease + dysrhythmias Atrial Fibrillation Rhythm:Regular Rate:Normal     Neuro/Psych    GI/Hepatic   Endo/Other    Renal/GU      Musculoskeletal   Abdominal   Peds  Hematology   Anesthesia Other Findings   Reproductive/Obstetrics                          Anesthesia Physical Anesthesia Plan  ASA: III  Anesthesia Plan: General   Post-op Pain Management:    Induction: Inhalational  Airway Management Planned: Oral ETT  Additional Equipment:   Intra-op Plan:   Post-operative Plan: Post-operative intubation/ventilation  Informed Consent: I have reviewed the patients History and Physical, chart, labs and discussed the procedure including the risks, benefits and alternatives for the proposed anesthesia with the patient or authorized representative who has indicated his/her understanding and acceptance.     Plan Discussed with: CRNA and Anesthesiologist  Anesthesia Plan Comments: (S/P Ruptured  AAA Repair 4/22 for wound VAC application. Hemodynamically stable, sedated on vent.  Kipp Brood)      Anesthesia Quick Evaluation

## 2012-09-24 NOTE — Progress Notes (Signed)
PULMONARY  / CRITICAL CARE MEDICINE  Name: Charles [name] MRN: [id number] DOB: Dec 02, 1945    ADMISSION DATE:  09/21/2012 CONSULTATION DATE:  09/22/12  REFERRING MD :  Darrick Penna PRIMARY SERVICE: Fields  CHIEF COMPLAINT:  Post AAA repair  BRIEF PATIENT DESCRIPTION:  67yo male smoker, ETOH tx from [place] 4/22 with ruptured AAA and Afib. Taken emergently to OR on arrival to [facility].  Pt remained vented post op with open abd and PCCM consulted for vent/medical mgmt.   SIGNIFICANT EVENTS:  4/23 OR -- AAA repair, Aorto left common iliac right common femoral bypass,aortic endarterectomy, right femoral endarterectomy profundaplasty, placement abdominal VAC 4/24 Off pressors, tolerating pressure support, TNA started  STUDIES: 4/23 CT abd/pelvis>>> rupturing AAA  LINES / TUBES: ETT 4/23>>> R IJ PA cath 4/23>>> 4/24 R radial artery 4/23>>> Rt IJ CVL 4.24 >>  CULTURES: blooc cultures x 2 4/23 >>> GNR >>> Urine 4/23>>> GNR >>>  ANTIBIOTICS: Ancef 4/22>>>4/23 Zosyn 4/23>>>  SUBJECTIVE: Tolerating pressure support.  Increased outpt from wound vac.  VITAL SIGNS: Temp:  [98.2 F (36.8 C)-100.2 F (37.9 C)] 100.2 F (37.9 C) (04/25 0400) Pulse Rate:  [56-90] 62 (04/25 0700) Resp:  [12-22] 16 (04/25 0700) BP: (106-146)/(52-90) 115/59 mmHg (04/25 0700) SpO2:  [100 %] 100 % (04/25 0700) Arterial Line BP: (104-153)/(50-69) 124/59 mmHg (04/25 0700) FiO2 (%):  [49.8 %-50.5 %] 49.9 % (04/25 0700) HEMODYNAMICS: PAP: (39-43)/(17-22) 40/19 mmHg CO:  [4.2 L/min-4.7 L/min] 4.7 L/min CI:  [2.9 L/min/m2-3.2 L/min/m2] 3.2 L/min/m2 VENTILATOR SETTINGS: Vent Mode:  [-] PRVC FiO2 (%):  [49.8 %-50.5 %] 49.9 % Set Rate:  [16 bmp] 16 bmp Vt Set:  [600 mL] 600 mL PEEP:  [4.5 cmH20-5 cmH20] 5 cmH20 Pressure Support:  [10 cmH20] 10 cmH20 Plateau Pressure:  [13 cmH20-21 cmH20] 21 cmH20 INTAKE / OUTPUT: Intake/Output     04/24 0701 - 04/25 0700 04/25 0701 - 04/26 0700   I.V. (mL/kg) 1969  (43.5)    Blood     NG/GT 150    IV Piggyback 350    TPN 532.7    Total Intake(mL/kg) 3001.6 (66.3)    Urine (mL/kg/hr) 1160 (1.1)    Emesis/NG output 300 (0.3)    Drains 1925 (1.8)    Total Output 3385     Net -383.4            PHYSICAL EXAMINATION: General: No distress Neuro: Sedated, awakens easily HEENT: ETT in place Cardiovascular: regular Lungs: prolonged exhalation, no wheeze Abdomen: distended, wound vac in place Ext: no edema  LABS:  Recent Labs Lab 09/21/12 2214  09/22/12 0845  09/22/12 0912 09/22/12 1149 09/22/12 1200 09/22/12 1443  09/22/12 2000 09/23/12 0400 09/23/12 0411 09/23/12 1141 09/23/12 1600 09/23/12 1700 09/24/12 0320  HGB 10.9*  < > 9.3*  --   --   --  9.1*  --   < >  --  8.8*  --   --  9.0*  --  8.5*  WBC 12.9*  --  13.4*  --   --   --   --   --   --   --  10.2  --   --   --   --  10.0  PLT 150  --  80*  --   --   --   --   --   --   --  64*  --   --   --   --  67*  NA 121*  < > 135  --   --   --  136  --   < >  --  136  --  139  --  138 139  K 3.5  < > 3.5  --   --   --  3.4*  --   < >  --  3.5  --  3.8  --  3.6 3.9  CL 82*  --  100  --   --   --  102  --   < >  --  104  --  108  --  107 109  CO2 27  --  23  --   --   --  23  --   < >  --  25  --  26  --  25 27  GLUCOSE 133*  < > 157*  --   --   --  148*  --   < >  --  102*  --  99  --  106* 128*  BUN 12  --  11  --   --   --  12  --   < >  --  17  --  18  --  18 19  CREATININE 0.44*  --  0.49*  --   --   --  0.52  --   < >  --  0.66  --  0.64  --  0.65 0.64  CALCIUM 8.3*  --  6.6*  --   --   --  6.5*  --   < >  --  6.6*  --  7.1*  --  7.3* 7.4*  MG 1.6  --  1.2*  --   --   --   --   --   --   --  2.1  --   --   --   --  2.3  PHOS  --   --   --   --   --   --   --   --   --   --   --   --   --   --   --  1.9*  AST 75*  --  57*  --   --   --   --   --   --   --  93*  --   --   --   --  88*  ALT 40  --  27  --   --   --   --   --   --   --  35  --   --   --   --  34  ALKPHOS 76  --  63  --    --   --   --   --   --   --  39  --   --   --   --  40  BILITOT 0.9  --  1.9*  --   --   --   --   --   --   --  0.7  --   --   --   --  0.5  PROT 6.6  --  4.8*  --   --   --   --   --   --   --  4.2*  --   --   --   --  4.2*  ALBUMIN 2.4*  --  2.4*  --   --   --   --   --   --   --  1.7*  --   --   --   --  1.5*  APTT  --   --  39*  --   --   --   --   --   --   --   --   --   --   --   --   --   INR  --   --  1.41  --   --   --  1.32  --   --   --  1.23  --   --   --   --   --   TROPONINI  --   --  <0.30  --   --   --   --  <0.30  --  <0.30  --   --   --   --   --   --   PHART  --   < >  --   < > 7.289* 7.410  --   --   --   --   --  7.463*  --   --   --   --   PCO2ART  --   < >  --   < > 45.4* 37.8  --   --   --   --   --  32.4*  --   --   --   --   PO2ART  --   < >  --   < > 116.0* 72.5*  --   --   --   --   --  91.0  --   --   --   --   < > = values in this interval not displayed.  Recent Labs Lab 09/23/12 1231 09/23/12 1726 09/23/12 2012 09/24/12 0011 09/24/12 0415  GLUCAP 104* 89 108* 149* 128*    CXR:  Dg Chest Port 1 View  09/23/2012  *RADIOLOGY REPORT*  Clinical Data: Status post right central line placement  PORTABLE CHEST - 1 VIEW  Comparison: 09/23/2012 06:56 hours  Findings: The previously seen Swan-Ganz catheter has been removed and a new right jugular line placed.  Catheter tip is noted in the mid superior vena cava.  An endotracheal tube is now noted just above the aortic knob.  Nasogastric catheter is noted within the stomach.  Right-sided pleural effusion is again noted.  No pneumothorax is noted.  IMPRESSION: Status post right central line placement as described.  No complicating factors are noted. These results will be called to the ordering clinician or representative by the Radiologist Assistant, and communication documented in the PACS Dashboard.   Original Report Authenticated By: Alcide Clever, M.D.    Dg Chest Port 1 View  09/23/2012  *RADIOLOGY REPORT*  Clinical  Data: Respiratory failure  PORTABLE CHEST - 1 VIEW  Comparison: 09/22/2012  Findings: Endotracheal tube in good position.  Swan-Ganz catheter in the right main pulmonary artery.  NG tube enters the stomach with the tip not visualized.  Increased density in both lung bases compared with the prior study. Increasing bilateral effusions and bibasilar atelectasis.  Diffuse bilateral airspace disease has improved slightly and may represent edema or pneumonia.  IMPRESSION: Increasing bilateral effusions and bibasilar atelectasis.  Mild improvement in pulmonary edema pattern.   Original Report Authenticated By: Janeece Riggers, M.D.    Dg Chest Portable 1 View  09/22/2012  *RADIOLOGY REPORT*  Clinical Data: Postoperative, line placement  PORTABLE CHEST - 1 VIEW  Comparison: Portable exam 0912 hours compared to 09/21/2012  Findings: Tip of endotracheal tube 3.4 cm above carina. Nasogastric tube extends into stomach. Right jugular Theone Murdoch catheter tip projecting over proximal right pulmonary artery.  Normal heart size. Atherosclerotic calcification aorta. Diffuse emphysematous changes with new perihilar infiltrates likely pulmonary edema. Right upper lobe bullous disease. No definite pleural effusion or pneumothorax. Bones demineralized. Extensive bilateral calcified carotid plaque.  IMPRESSION: Line and tube positions as above. Changes of COPD with right apical bullous disease and superimposed perihilar infiltrates likely edema.   Original Report Authenticated By: Ulyses Southward, M.D.      ASSESSMENT / PLAN:  PULMONARY A:  Acute respiratory failure in setting of AAA. Hx of smoking and ?COPD.  P:   - pressure support wean as tolerated >> defer extubation until OR trips done -BD's to prn -f/u CXR  CARDIOVASCULAR A:  Ruptured AAA s/p repair 4/23. PAF >> back to sinus 4/23. Hemorrhagic shock >> resolved. Hx of HTN, PVD.  P:  - goal even fluid balance - post-op care per VVS >> to OR for possible wound  closure 4/25 - PRN hydralazine, lopressor for HTN - f/u Echo >> deferred until wound vac removed  RENAL A:   Hypokalemia.  P:   - f/u and replace electrolytes as needed - monitor renal fx, urine outpt  GASTROINTESTINAL A:   Severe protein calorie malnutrition.  P:   - TNA started 4/24 - d/c dextrose from IV fluid now that he is on TNA  HEMATOLOGIC A:  Acute blood loss anemia from AAA rupture, and critical illness. Thrombocytopenia >> likely from critical illness and hx of ETOH. P:  - f/u CBC - transfuse for Hb < 7 or active bleeding  INFECTIOUS A:  GNR in urine and blood cultures. P:   - continue zosyn - f/u culture results  ENDOCRINE A:  Hyperglycemia >> likely 2nd to acute stress. HbA1C 5.8 from 4/23. P:   - SSI   NEUROLOGIC A:  Sedation. Hx of ETOH. P:   - Continuous sedation protocol - Daily WUA - add thiamine, folic acid  CC time 40 minutes.  Coralyn Helling, MD [facility] Pulmonary/Critical Care 09/24/2012, 7:17 AM Pager:  [phone] After 3pm call: [phone]

## 2012-09-24 NOTE — OR Nursing (Signed)
Call placed to SICU to inform of patient transfer back to the unit from the OR in @ .

## 2012-09-24 NOTE — Progress Notes (Signed)
Pt desating 89-91%. Increased fio2 in increments to 60%.

## 2012-09-24 NOTE — Anesthesia Postprocedure Evaluation (Signed)
  Anesthesia Post-op Note  Patient: Charles [name]  Procedure(s) Performed: Procedure(s): Removal of Abdominal wound VAC, and Abdominal Closure  Patient Location: SICU  Anesthesia Type:General  Level of Consciousness: sedated and Patient remains intubated per anesthesia plan  Airway and Oxygen Therapy: Patient remains intubated per anesthesia plan and Patient placed on Ventilator (see vital sign flow sheet for setting)  Post-op Pain: none  Post-op Assessment: Post-op Vital signs reviewed and Patient's Cardiovascular Status Stable  Post-op Vital Signs: stable  Complications: No apparent anesthesia complications

## 2012-09-24 NOTE — Progress Notes (Signed)
NUTRITION FOLLOW UP  DOCUMENTATION CODES  Per approved criteria   -Severe malnutrition in the context of acute illness or injury  -Underweight    Intervention:    Recommend EN trial with trickle feeds: Vital AF 1.2 formula at 20 ml/hr  TPN per pharmacy  Monitor Mg, Phos, K+ levels RD to follow for nutrition care plan  Nutrition Dx:   Inadequate oral intake related to inability to eat as evidenced by NPO status, ongoing  New Goal:   TPN to meet >90% of estimated protein needs, maximize energy provision as able during national lipid backorder, progressing  Monitor:   TPN prescription, respiratory status, weight, labs, I/O's  Assessment:   Patient s/p procedures 4/22:  AORTO LEFT COMMON ILIAC RIGHT COMMON FEMORAL BYPASS  AORTIC ENDARTERECTOMY  RIGHT FEMORAL ENDARTERECTOMY PROFUNDAPLASTY  PLACEMENT OF ABDOMINAL WOUND VAC  Patient remains intubated on ventilator support MV: 8.3 Temp: 37.3  NGT in place.  Back to OR for VAC change today.  Hopefully can start trickle tube feeding to preserve gut integrity.  Patient is receiving TPN with Clinimix E 5/15 @ 55 ml/hr.  Lipids (20% IVFE @ 9 ml/hr), multivitamins, and trace elements are provided 3 times weekly (MWF) due to national backorder.  Provides 1122 kcal and 66 grams protein daily (based on weekly average).  Meets 83% minimum estimated kcal and 82% minimum estimated protein needs.  Patient at risk for refeeding with nutrition support initiation & advancement given malnutrition identification.  Height: Ht Readings from Last 1 Encounters:  09/21/12 5\' 4"  (1.626 m)    Weight Status:   Wt Readings from Last 1 Encounters:  09/21/12 99 lb 13.9 oz (45.3 kg)    Re-estimated needs:  Kcal: 1350-1500 Protein: 80-90 gm Fluid: per MD  Skin: abdominal wound VAC  Diet Order: NPO   Intake/Output Summary (Last 24 hours) at 09/24/12 0915 Last data filed at 09/24/12 0810  Gross per 24 hour  Intake 2971.81 ml  Output   3455  ml  Net -483.19 ml    Labs:   Recent Labs Lab 09/22/12 0845  09/23/12 0400 09/23/12 1141 09/23/12 1700 09/24/12 0320  NA 135  < > 136 139 138 139  K 3.5  < > 3.5 3.8 3.6 3.9  CL 100  < > 104 108 107 109  CO2 23  < > 25 26 25 27   BUN 11  < > 17 18 18 19   CREATININE 0.49*  < > 0.66 0.64 0.65 0.64  CALCIUM 6.6*  < > 6.6* 7.1* 7.3* 7.4*  MG 1.2*  --  2.1  --   --  2.3  PHOS  --   --   --   --   --  1.9*  GLUCOSE 157*  < > 102* 99 106* 128*  < > = values in this interval not displayed.  CBG (last 3)   Recent Labs  09/24/12 0011 09/24/12 0415 09/24/12 0812  GLUCAP 149* 128* 118*    Scheduled Meds: . antiseptic oral rinse  15 mL Mouth Rinse QID  . chlorhexidine  15 mL Mouth Rinse BID  . folic acid  1 mg Intravenous Daily  . insulin aspart  0-15 Units Subcutaneous Q4H  . metoprolol  5 mg Intravenous Q6H  . pantoprazole (PROTONIX) IV  40 mg Intravenous QHS  . piperacillin-tazobactam (ZOSYN)  IV  3.375 g Intravenous Q8H  . potassium phosphate IVPB (mmol)  20 mmol Intravenous Once  . sodium chloride  10-40 mL Intracatheter Q12H  .  thiamine  100 mg Intravenous Daily    Continuous Infusions: . Marland KitchenTPN (CLINIMIX-E) Adult 40 mL/hr at 09/24/12 0600  . sodium chloride 40 mL/hr at 09/24/12 0810  . Marland KitchenTPN (CLINIMIX-E) Adult     And  . fat emulsion    . fentaNYL infusion INTRAVENOUS Stopped (09/24/12 0800)  . midazolam (VERSED) infusion Stopped (09/23/12 0720)    Maureen Chatters, RD, LDN Pager #: [phone] After-Hours Pager #: [phone]

## 2012-09-24 NOTE — Progress Notes (Signed)
Increased patients peep to 10cm,due to SP02=85-87%. Dr. Frederico Hamman notiied on increase in peep level

## 2012-09-24 NOTE — Progress Notes (Addendum)
[facility] [place]  Post-op  Intra-abdominal Surgery note  Date of Surgery: 09/21/2012 - 09/22/2012  Surgeon(s): Sherren Kerns, MD Sherren Kerns, MD  3 Days Post-Op Procedure(s): ANEURYSM ABDOMINAL AORTIC REPAIR EMBOLECTOMY left CFA PATCH ANGIOPLASTY  History of Present Illness  Charles [name] is a 67 y.o. male who is  3 days post-op. Comfortable on vent, sedated. NGT 300cc/24 hr Wound vac 1925cc/24hr Still up 7L of fluid  Significant Diagnostic Studies: CBC Lab Results  Component Value Date   WBC 10.0 09/24/2012   HGB 8.5* 09/24/2012   HCT 24.0* 09/24/2012   MCV 88.2 09/24/2012   PLT 67* 09/24/2012    BMET    Component Value Date/Time   NA 139 09/24/2012 0320   K 3.9 09/24/2012 0320   CL 109 09/24/2012 0320   CO2 27 09/24/2012 0320   GLUCOSE 128* 09/24/2012 0320   BUN 19 09/24/2012 0320   CREATININE 0.64 09/24/2012 0320   CALCIUM 7.4* 09/24/2012 0320   GFRNONAA >90 09/24/2012 0320   GFRAA >90 09/24/2012 0320    COAG Lab Results  Component Value Date   INR 1.23 09/23/2012   INR 1.32 09/22/2012   INR 1.41 09/22/2012   No results found for this basename: PTT    I/O last 3 completed shifts: In: 4394.1 [I.V.:3261.5; NG/GT:150; IV Piggyback:450] Out: 0981 [XBJYN:8295; Emesis/NG output:300; Drains:3125]    Physical Examination BP Readings from Last 3 Encounters:  09/24/12 115/59  09/24/12 115/59  09/24/12 115/59   Temp Readings from Last 3 Encounters:  09/24/12 100.2 F (37.9 C)   09/24/12 100.2 F (37.9 C)   09/24/12 100.2 F (37.9 C)    SpO2 Readings from Last 3 Encounters:  09/24/12 100%  09/24/12 100%  09/24/12 100%   Pulse Readings from Last 3 Encounters:  09/24/12 62  09/24/12 62  09/24/12 62    General: pt sedated Pulmonary: normal non-labored breathing  Cardiac: Heart rate : irregular ,  Abdomen:soft,full Abdominal wound:separated With wound vac in place  Vascular Exam:BLE warm  Extremities without ischemic  changes, no Gangrene, no cellulitis; no open wounds;   LOWER EXTREMITY PULSES           RIGHT                                      LEFT      POSTERIOR TIBIAL  monophasic by Doppler  monophasic by Doppler       DORSALIS PEDIS      ANTERIOR TIBIAL  monophasic by Doppler  monophasic by Doppler    Assessment/Plan: Charles [name] is a 67 y.o. male who is 3 Days Post-Op  Pt sedate on vent, but opens eyes RN states trying to communicate last PM Vac drainage cklose to 2000cc/24hrs Nutrition-on TNA ETOH abuse- sedated on vent with thiamine and folate in IVF Acute blood loss anemia - stable Renal function WNL CK total beginning to come down, trop WNL A.fib - on BBlocker Strict I/O - keep foley  ROCZNIAK,REGINA [name] [phone] 09/24/2012 7:36 AM  Overall remarkably stable. Will return to OR today for VAC change and attempt to close abdomen.  Pt family updated.  If gut looks healthy will place panda tube and start tube feeds.  Continue antibiotic coverage for now.  Fabienne Bruns, MD [facility] Office: [phone] Pager: [phone]

## 2012-09-24 NOTE — Transfer of Care (Signed)
Immediate Anesthesia Transfer of Care Note  Patient: Charles [name]  Procedure(s) Performed: Procedure(s): Removal of Abdominal wound VAC, and Abdominal Closure  Patient Location: SICU  Anesthesia Type:General  Level of Consciousness: sedated and Patient remains intubated per anesthesia plan  Airway & Oxygen Therapy: Patient remains intubated per anesthesia plan and Patient placed on Ventilator (see vital sign flow sheet for setting)  Post-op Assessment: Report given to PACU RN and Post -op Vital signs reviewed and stable  Post vital signs: Reviewed and stable  Complications: No apparent anesthesia complications

## 2012-09-24 NOTE — Progress Notes (Signed)
FIO2 reduced to 40% and RR reduced to 14 per MD order.

## 2012-09-24 NOTE — Significant Event (Signed)
Panda placed per order (marked at 75cm). Panda tube placed following protocol, second verification with Amy Loflin, via ascultation. KUB obtained afterwards per verbal order of MD Fields, which showed panda in mid stomach. Dr. Darrick Penna gave verbal order not to start TF if panda tube is not post-pyloric. Panda advance and KUB is obtained several hours later. Kennith Morss, Charity fundraiser

## 2012-09-24 NOTE — Op Note (Signed)
Procedure: Exploratory laparotomy closure of the abdomen  Preoperative diagnosis: Open abdomen post ruptured aneurysm repair  Postoperative diagnosis: Same Anesthesia: Gen.  Findings: Normal appearing viscera with no evidence of ischemic bowel  Operative details: After obtaining informed consent, the patient was taken to the operating room. The patient was placed in supine position on the operating room table. After induction of general anesthesia, the patient's entire abdomen was prepped and draped in usual sterile fashion. Next 4 L of warm sterile normal saline solution was used to irrigate the abdomen. The liver was normal in appearance. The small bowel and colon were viable. The viscera were returned to their normal position and the fascia was reapproximated using a running #1 PDS suture. The patient's peak airway pressures were only 13 during the course of closure of the abdomen. The skin was then closed with staples. The instrument sponge and needle counts were correct at the end of the case. The patient was taken to the intensive care unit in stable condition.  Fabienne Bruns, MD [facility] Office: [phone] Pager: [phone]

## 2012-09-24 NOTE — Progress Notes (Signed)
PARENTERAL NUTRITION CONSULT NOTE - FOLLOW UP  Pharmacy Consult for TPN Indication: s/p AAA repair with open abdomen; 2 month hx of poor po intake with significant weight loss  No Known Allergies  Patient Measurements: Height: 5\' 4"  (162.6 cm) Weight: 99 lb 13.9 oz (45.3 kg) IBW/kg (Calculated) : 59.2  Vital Signs: Temp: 100.2 F (37.9 C) (04/25 0400) Temp src: Oral (04/25 0000) BP: 115/59 mmHg (04/25 0700) Pulse Rate: 62 (04/25 0700) Intake/Output from previous day: 04/24 0701 - 04/25 0700 In: 3001.6 [I.V.:1969; NG/GT:150; IV Piggyback:350; TPN:532.7] Out: 3385 [Urine:1160; Emesis/NG output:300; Drains:1925] Intake/Output from this shift:    Labs:  Recent Labs  09/22/12 0845 09/22/12 1200  09/23/12 0400 09/23/12 1600 09/24/12 0320  WBC 13.4*  --   --  10.2  --  10.0  HGB 9.3* 9.1*  < > 8.8* 9.0* 8.5*  HCT 26.1* 25.1*  < > 24.0* 25.1* 24.0*  PLT 80*  --   --  64*  --  67*  APTT 39*  --   --   --   --   --   INR 1.41 1.32  --  1.23  --   --   < > = values in this interval not displayed.   Recent Labs  09/22/12 0845  09/23/12 0400 09/23/12 1141 09/23/12 1700 09/24/12 0320  NA 135  < > 136 139 138 139  K 3.5  < > 3.5 3.8 3.6 3.9  CL 100  < > 104 108 107 109  CO2 23  < > 25 26 25 27   GLUCOSE 157*  < > 102* 99 106* 128*  BUN 11  < > 17 18 18 19   CREATININE 0.49*  < > 0.66 0.64 0.65 0.64  CALCIUM 6.6*  < > 6.6* 7.1* 7.3* 7.4*  MG 1.2*  --  2.1  --   --  2.3  PHOS  --   --   --   --   --  1.9*  PROT 4.8*  --  4.2*  --   --  4.2*  ALBUMIN 2.4*  --  1.7*  --   --  1.5*  AST 57*  --  93*  --   --  88*  ALT 27  --  35  --   --  34  ALKPHOS 63  --  39  --   --  40  BILITOT 1.9*  --  0.7  --   --  0.5  TRIG  --   --   --   --   --  89  CHOL  --   --   --   --   --  81  < > = values in this interval not displayed. Estimated Creatinine Clearance: 56.6 ml/min (by C-G formula based on Cr of 0.64).    Recent Labs  09/23/12 2012 09/24/12 0011 09/24/12 0415   GLUCAP 108* 149* 128*    Insulin Requirements in the past 24 hours:  4 units SSI  Current Nutrition:  NPO Clinimix E 5/15 at 40cc/hr provides 48gm protein, 680 Kcal. Goal is 70 cc/hr to provide 84gm protein and avg 1380 Kcal with IV fats at 9cc/hr on MWF  Nutritional Goals:  1350-1500 kCal, 80-90 grams of protein per day  Assessment: Pt presented to [facility] with 2 month hx of poor PO intake and weight loss due to mouth pain, abdominal pain, and low back pain. Down to 99# from usual weight of 120#. Found to have  ruptured AAA, AFib and transferred to [facility]. Now s/p AAA repair, pod#3.  GI: Open abdomen, vac in place, output decreasing, 1.9L/24h. Possible repeat OR today for vac change. NG tube in place, possible TF post -OR today.   Endo: No hx DM. CBGs controlled on SSI along, will keep this on board until TPN is at goal. Lytes: Phos low. Other lytes nml. Corr Ca 9.1. IVF = D5NS at  25ml/hr. Fluid requirement ~1.4-1.8 L/d, would keep on lower end since overall positive with open abdomen, edema would preclude closure. Renal: No hx renal dz, Cr < 1 and uop 1.1 ml/kg/hr  Pulm: COPD, heavy smoker. FiO2 0.50 Cards: AFib, controlled on Metoprolol. VSS, off pressors. Hepatobil: Hx EtOH abuse, thiamine and folic acid daily. AST trending down, other LFTs nml. Trig and Chol nml. Neuro: Fentanyl and Versed drips, RASS -3. ID: Zosyn #[id number] for GNR UTI, GNR bacteremia (speciation pending).  Best Practices: SCDs, mouth care  TPN Access: R-IJ CVC TPN day#: 1  Plan:  - 20 mmol of IV KPhos now - Change IVF to NS at 40 cc/hr - Advance Clinimix E 5/15 to 55 cc/hr with bag tonight - Will supplement MVI, trace elements and IV fats on MWF due to ongoing Publishing copy. - BMET, Mg and Phos in AM - Will f/up abd wound status and progress with EN in AM  Thanks, Felicity Penix K. Allena Katz, PharmD, BCPS.  Clinical Pharmacist Pager [phone]. 09/24/2012 8:02 AM

## 2012-09-24 NOTE — Progress Notes (Signed)
UR Completed.  Rhyse Loux [name] [phone] 09/24/2012  

## 2012-09-25 ENCOUNTER — Inpatient Hospital Stay (HOSPITAL_COMMUNITY): Payer: Medicare Other

## 2012-09-25 DIAGNOSIS — R7881 Bacteremia: Secondary | ICD-10-CM | POA: Diagnosis not present

## 2012-09-25 LAB — TYPE AND SCREEN
ABO/RH(D): A POS
Unit division: 0
Unit division: 0
Unit division: 0
Unit division: 0
Unit division: 0

## 2012-09-25 LAB — GLUCOSE, CAPILLARY
Glucose-Capillary: 97 mg/dL (ref 70–99)
Glucose-Capillary: 129 mg/dL — ABNORMAL HIGH (ref 70–99)
Glucose-Capillary: 108 mg/dL — ABNORMAL HIGH (ref 70–99)

## 2012-09-25 LAB — POCT I-STAT 3, ART BLOOD GAS (G3+)
Bicarbonate: 23 mEq/L (ref 20.0–24.0)
pCO2 arterial: 38.6 mmHg (ref 35.0–45.0)
pO2, Arterial: 60 mmHg — ABNORMAL LOW (ref 80.0–100.0)

## 2012-09-25 LAB — PHOSPHORUS: Phosphorus: 2.6 mg/dL (ref 2.3–4.6)

## 2012-09-25 LAB — CBC
Hemoglobin: 8.7 g/dL — ABNORMAL LOW (ref 13.0–17.0)
MCHC: 35.4 g/dL (ref 30.0–36.0)
RBC: 2.75 MIL/uL — ABNORMAL LOW (ref 4.22–5.81)

## 2012-09-25 LAB — BASIC METABOLIC PANEL
GFR calc Af Amer: >90 mL/min (ref >90)
GFR calc non Af Amer: >90 mL/min (ref >90)
Potassium: 3.5 mEq/L (ref 3.5–5.1)
Sodium: 139 mEq/L (ref 135–145)

## 2012-09-25 LAB — CK TOTAL AND CKMB (NOT AT ARMC)
CK, MB: 7.2 ng/mL (ref 0.3–4.0)
Total CK: 830 U/L — ABNORMAL HIGH (ref 7–232)

## 2012-09-25 LAB — MAGNESIUM: Magnesium: 2.1 mg/dL (ref 1.5–2.5)

## 2012-09-25 MED ORDER — MIDAZOLAM HCL 2 MG/2ML IJ SOLN: 2.0000 mg | Freq: Once | INTRAMUSCULAR | Status: AC

## 2012-09-25 MED ORDER — SODIUM CHLORIDE 0.9 % IV SOLN
INTRAVENOUS | Status: DC
  Administered 2012-09-25 – 2012-10-03 (×5): via INTRAVENOUS

## 2012-09-25 MED ORDER — FUROSEMIDE 10 MG/ML IJ SOLN
80.0000 mg | Freq: Once | INTRAMUSCULAR | Status: AC
  Administered 2012-09-25: 80 mg via INTRAVENOUS

## 2012-09-25 MED ORDER — CLINIMIX E/DEXTROSE (5/15) 5 % IV SOLN
INTRAVENOUS | Status: AC
  Administered 2012-09-25: 18:00:00 via INTRAVENOUS
  Filled 2012-09-25: qty 2000

## 2012-09-25 MED ORDER — FENTANYL CITRATE 0.05 MG/ML IJ SOLN: 50.0000 ug | Freq: Once | INTRAMUSCULAR | Status: AC

## 2012-09-25 MED ORDER — POTASSIUM PHOSPHATE DIBASIC 3 MMOLE/ML IV SOLN
20.0000 mmol | Freq: Once | INTRAVENOUS | Status: AC
  Administered 2012-09-25: 20 mmol via INTRAVENOUS
  Filled 2012-09-25: qty 6.67

## 2012-09-25 NOTE — Progress Notes (Signed)
Recruitment maneuver done with the following parameters. PC40/100%/RR10/+5 with inspiratory time at 3 seconds for two minutes. Patient remained hemodynamically stable during the recruitment with SP02 increasing form 87% to 99%

## 2012-09-25 NOTE — Progress Notes (Signed)
PARENTERAL NUTRITION CONSULT NOTE - FOLLOW UP  Pharmacy Consult for TPN Indication: s/p AAA repair with open abdomen; 2 month hx of poor po intake with significant weight loss  No Known Allergies  Patient Measurements: Height: 5\' 4"  (162.6 cm) Weight: 129 lb 10.1 oz (58.8 kg) IBW/kg (Calculated) : 59.2  Vital Signs: Temp: 98.6 F (37 C) (04/26 0407) Temp src: Oral (04/26 0407) BP: 144/79 mmHg (04/26 0500) Pulse Rate: 111 (04/26 0600) Intake/Output from previous day: 04/25 0701 - 04/26 0700 In: 3246.7 [I.V.:1079.5; NG/GT:120; IV Piggyback:820; TPN:1227.2] Out: 1795 [Urine:1345; Emesis/NG output:250; Drains:200] Intake/Output from this shift:    Labs:  Recent Labs  09/22/12 0845 09/22/12 1200  09/23/12 0400  09/24/12 0320 09/24/12 1600 09/25/12 0500  WBC 13.4*  --   --  10.2  --  10.0  --  9.9  HGB 9.3* 9.1*  < > 8.8*  < > 8.5* 9.6* 8.7*  HCT 26.1* 25.1*  < > 24.0*  < > 24.0* 26.7* 24.6*  PLT 80*  --   --  64*  --  67*  --  65*  APTT 39*  --   --   --   --   --   --   --   INR 1.41 1.32  --  1.23  --   --   --   --   < > = values in this interval not displayed.   Recent Labs  09/22/12 0845  09/23/12 0400  09/23/12 1700 09/24/12 0320 09/25/12 0500  NA 135  < > 136  < > 138 139 139  K 3.5  < > 3.5  < > 3.6 3.9 3.5  CL 100  < > 104  < > 107 109 108  CO2 23  < > 25  < > 25 27 26   GLUCOSE 157*  < > 102*  < > 106* 128* 131*  BUN 11  < > 17  < > 18 19 18   CREATININE 0.49*  < > 0.66  < > 0.65 0.64 0.60  CALCIUM 6.6*  < > 6.6*  < > 7.3* 7.4* 7.7*  MG 1.2*  --  2.1  --   --  2.3 2.1  PHOS  --   --   --   --   --  1.9* 2.6  PROT 4.8*  --  4.2*  --   --  4.2*  --   ALBUMIN 2.4*  --  1.7*  --   --  1.5*  --   AST 57*  --  93*  --   --  88*  --   ALT 27  --  35  --   --  34  --   ALKPHOS 63  --  39  --   --  40  --   BILITOT 1.9*  --  0.7  --   --  0.5  --   PREALBUMIN  --   --   --   --   --  3.9*  --   TRIG  --   --   --   --   --  89  --   CHOL  --   --   --    --   --  81  --   < > = values in this interval not displayed. Estimated Creatinine Clearance: 73.5 ml/min (by C-G formula based on Cr of 0.6).    Recent Labs  09/24/12 1946 09/24/12 2357 09/25/12 0357  GLUCAP 90 122* 127*    Insulin Requirements in the past 24 hours:  9 units SSI/24h  Current Nutrition:  NPO Clinimix E 5/15 at 55cc/hr (Goal is 70 cc/hr to provide 84gm protein and avg 1380 Kcal with IV fats at 9cc/hr on MWF)  Nutritional Goals:  1350-1500 kCal, 80-90 grams of protein per day  Assessment: Pt presented to [facility] with 2 month hx of poor PO intake and weight loss due to mouth pain, abdominal pain, and low back pain. Down to 99# from usual weight of 120#. Found to have ruptured AAA, AFib and transferred to [facility]. Now s/p AAA repair, pod#4.  GI: s/p abd closure 4/25, drain output decreasing, 0.3L/24h. NGT output 0.25L/24h. Panda in place, tip coiled in proximal stomach- no plans to feed until post-pyloric.    Endo: No hx DM. CBGs controlled on SSI alone, will keep this on board until TPN is at goal.  Lytes: Lytes nml. Corr Ca 9.1, target is K ~ 4, Mg ~2 and Phos ~3 post-op to prevent ileus. IVF =NS at  78ml/hr. Fluid requirement ~1.4-1.8 L/d. Continues to have positive balance, also noticed wt up.  Renal: No hx renal dz, Cr < 1 and uop 1 ml/kg/hr. Acid-base status ok.  Pulm: COPD, heavy smoker. FiO2 0.50  Cards: AFib, controlled on Metoprolol. VSS, off pressors.  Hepatobil: Hx EtOH abuse, thiamine and folic acid daily. AST trending down, other LFTs nml. Trig and Chol nml. Prealbumin low, as expected with ongoing inflammation, malnutrition and post-op status; would expect slow trend up.  Neuro: Fentanyl and Versed drips, RASS 0.  ID: Zosyn #4 for GNR UTI, salmonella bacteremia. Patient is afebrile, WBC nml.  Best Practices: SCDs, mouth care   TPN Access: R-IJ CVC  TPN day#: 2  Plan:  - 20 mmol of IV KPhos now - Change IVF to NS at 20 cc/hr @ 1800 when TPN rate  is increased - Advance Clinimix E 5/15 to 70 cc/hr with bag tonight - Will supplement MVI, trace elements and IV fats on MWF due to ongoing Publishing copy. - BMET, Mg and Phos in AM - Will f/up progress with EN in AM  Thanks, Quention Mcneill K. Allena Katz, PharmD, BCPS.  Clinical Pharmacist Pager [phone]. 09/25/2012 7:05 AM

## 2012-09-25 NOTE — Progress Notes (Signed)
PULMONARY  / CRITICAL CARE MEDICINE  Name: Charles [name] MRN: [id number] DOB: 02-May-1946    ADMISSION DATE:  09/21/2012 CONSULTATION DATE:  09/22/12  REFERRING MD :  Darrick Penna PRIMARY SERVICE: Fields  CHIEF COMPLAINT:  Post AAA repair  BRIEF PATIENT DESCRIPTION:  67yo male smoker, ETOH tx from [place] 4/22 with ruptured AAA and Afib. Taken emergently to OR on arrival to [facility].  Pt remained vented post op with open abd and PCCM consulted for vent/medical mgmt.   SIGNIFICANT EVENTS:  4/23 OR -- AAA repair, Aorto left common iliac right common femoral bypass,aortic endarterectomy, right femoral endarterectomy profundaplasty, placement abdominal VAC 4/24 Off pressors, tolerating pressure support, TNA started  STUDIES: 4/23 CT abd/pelvis>>> rupturing AAA  LINES / TUBES: ETT 4/23>>> R IJ PA cath 4/23>>> 4/24 R radial artery 4/23>>> Rt IJ CVL 4.24 >>  CULTURES: blooc cultures x 2 4/23 >>> Salmonella  Urine 4/23>>> GNR >>>  ANTIBIOTICS: Ancef 4/22>>>4/23 Zosyn 4/23>>>  SUBJECTIVE: Increased FiO2 and PEEP needs post-op wound vac removal and closure 4/25, currently 0.50 + 10  VITAL SIGNS: Temp:  [98.3 F (36.8 C)-99.6 F (37.6 C)] 98.5 F (36.9 C) (04/26 0802) Pulse Rate:  [75-112] 89 (04/26 0801) Resp:  [14-29] 19 (04/26 0801) BP: (127-181)/(63-92) 159/72 mmHg (04/26 0801) SpO2:  [91 %-100 %] 91 % (04/26 0801) Arterial Line BP: (121-197)/(60-99) 163/73 mmHg (04/26 0800) FiO2 (%):  [0.4 %-69.9 %] 50 % (04/26 0801) Weight:  [56.6 kg (124 lb 12.5 oz)-58.8 kg (129 lb 10.1 oz)] 58.8 kg (129 lb 10.1 oz) (04/26 0500) HEMODYNAMICS:   VENTILATOR SETTINGS: Vent Mode:  [-] PRVC FiO2 (%):  [0.4 %-69.9 %] 50 % Set Rate:  [14 bmp] 14 bmp Vt Set:  [600 mL] 600 mL PEEP:  [5 cmH20-10 cmH20] 10 cmH20 Plateau Pressure:  [22 cmH20-28 cmH20] 22 cmH20 INTAKE / OUTPUT: Intake/Output     04/25 0701 - 04/26 0700 04/26 0701 - 04/27 0700   I.V. (mL/kg) 1079.5 (18.4) 18 (0.3)   NG/GT  120 30   IV Piggyback 820    TPN 1227.2    Total Intake(mL/kg) 3246.7 (55.2) 48 (0.8)   Urine (mL/kg/hr) 1345 (1) 110 (0.5)   Emesis/NG output 250 (0.2)    Drains 200 (0.1)    Total Output 1795 110   Net +1451.7 -62          PHYSICAL EXAMINATION: General: No distress Neuro: Sedated, awakens easily HEENT: ETT in place Cardiovascular: regular Lungs: prolonged exhalation, no wheeze Abdomen: distended, wound vac in place Ext: no edema  LABS:  Recent Labs Lab 09/21/12 2214  09/22/12 0845  09/22/12 1149 09/22/12 1200 09/22/12 1443  09/22/12 2000 09/23/12 0400 09/23/12 0411  09/23/12 1700 09/24/12 0320 09/24/12 1600 09/24/12 1918 09/25/12 0500  HGB 10.9*  < > 9.3*  --   --  9.1*  --   < >  --  8.8*  --   < >  --  8.5* 9.6*  --  8.7*  WBC 12.9*  --  13.4*  --   --   --   --   --   --  10.2  --   --   --  10.0  --   --  9.9  PLT 150  --  80*  --   --   --   --   --   --  64*  --   --   --  67*  --   --  65*  NA 121*  < > 135  --   --  136  --   < >  --  136  --   < > 138 139  --   --  139  K 3.5  < > 3.5  --   --  3.4*  --   < >  --  3.5  --   < > 3.6 3.9  --   --  3.5  CL 82*  --  100  --   --  102  --   < >  --  104  --   < > 107 109  --   --  108  CO2 27  --  23  --   --  23  --   < >  --  25  --   < > 25 27  --   --  26  GLUCOSE 133*  < > 157*  --   --  148*  --   < >  --  102*  --   < > 106* 128*  --   --  131*  BUN 12  --  11  --   --  12  --   < >  --  17  --   < > 18 19  --   --  18  CREATININE 0.44*  --  0.49*  --   --  0.52  --   < >  --  0.66  --   < > 0.65 0.64  --   --  0.60  CALCIUM 8.3*  --  6.6*  --   --  6.5*  --   < >  --  6.6*  --   < > 7.3* 7.4*  --   --  7.7*  MG 1.6  --  1.2*  --   --   --   --   --   --  2.1  --   --   --  2.3  --   --  2.1  PHOS  --   --   --   --   --   --   --   --   --   --   --   --   --  1.9*  --   --  2.6  AST 75*  --  57*  --   --   --   --   --   --  93*  --   --   --  88*  --   --   --   ALT 40  --  27  --   --   --   --    --   --  35  --   --   --  34  --   --   --   ALKPHOS 76  --  63  --   --   --   --   --   --  39  --   --   --  40  --   --   --   BILITOT 0.9  --  1.9*  --   --   --   --   --   --  0.7  --   --   --  0.5  --   --   --   PROT 6.6  --  4.8*  --   --   --   --   --   --  4.2*  --   --   --  4.2*  --   --   --   ALBUMIN 2.4*  --  2.4*  --   --   --   --   --   --  1.7*  --   --   --  1.5*  --   --   --   APTT  --   --  39*  --   --   --   --   --   --   --   --   --   --   --   --   --   --   INR  --   --  1.41  --   --  1.32  --   --   --  1.23  --   --   --   --   --   --   --   TROPONINI  --   --  <0.30  --   --   --  <0.30  --  <0.30  --   --   --   --   --   --   --   --   PHART  --   < >  --   < > 7.410  --   --   --   --   --  7.463*  --   --   --   --  7.421  --   PCO2ART  --   < >  --   < > 37.8  --   --   --   --   --  32.4*  --   --   --   --  33.9*  --   PO2ART  --   < >  --   < > 72.5*  --   --   --   --   --  91.0  --   --   --   --  52.0*  --   < > = values in this interval not displayed.  Recent Labs Lab 09/24/12 1539 09/24/12 1946 09/24/12 2357 09/25/12 0357 09/25/12 0806  GLUCAP 143* 90 122* 127* 97    CXR:  Dg Chest Port 1 View  09/25/2012  *RADIOLOGY REPORT*  Clinical Data: Shortness of breath, evaluate pleural effusions  PORTABLE CHEST - 1 VIEW  Comparison: 09/24/2012; 09/23/2012; 09/22/2012  Findings: Grossly unchanged cardiac silhouette and mediastinal contours with atherosclerotic calcifications within the thoracic aorta.  Stable position of support apparatus.  No pneumothorax. The suspected decreased in size of right-sided pleural effusion and though note, the right costophrenic angle is excluded from view. Suspected trace / small left-sided pleural effusion.  Minimal improved aeration of the bilateral lung bases with persistent rather diffuse coarse reticular opacities primarily within the bilateral perihilar lung.  No new focal airspace opacities. Unchanged bones.   Vascular calcifications overlie the bilateral axilla.  IMPRESSION: 1.  Stable positioning of support apparatus.  No pneumothorax. 2. Overall findings suggestive of a minimally improved edema and atelectasis, though note, underlying infection (including atypical etiologies) is not excluded. 3.  Suspected minimal decrease in right-sided pleural effusion with possible minimal increase in left-sided effusion.   Original Report Authenticated By: Tacey Ruiz, MD    Dg Chest Port 1 View  09/24/2012  *RADIOLOGY REPORT*  Clinical Data: Hypoxia  PORTABLE CHEST - 1 VIEW  Comparison: 09/24/2012  Findings: Endotracheal tube is appropriately positioned.  NG tube and feeding tube terminate below the level of the hemidiaphragms are not included in the field of view.  Left upper quadrant clips are partly visualized.  Right IJ central line tip terminates over the distal SVC.  Mild enlargement of the cardiomediastinal silhouette again noted with interval increase in perihilar ill- defined airspace opacities, Kerley B lines, and pleural effusions.  IMPRESSION: Constellation of findings compatible with progressive pulmonary edema.   Original Report Authenticated By: Christiana Pellant, M.D.    Dg Chest Port 1 View  09/24/2012  *RADIOLOGY REPORT*  Clinical Data: Follow up pleural effusions  PORTABLE CHEST - 1 VIEW  Comparison: 09/23/2012  Findings: The right internal jugular CVP, endotracheal tube and nasogastric tube are unchanged in position.  The lung fields are hyperexpanded.  Heart and mediastinal contours are stable. Bilateral veiling density compatible with layering effusions are again appreciated and felt to be unchanged in degree in comparison with the prior exam. Some associated bibasilar atelectasis is suspected.  Haziness of the interstitial markings and prominence of the minor fissure suggests that this is be related to clearing edema pattern.  Some more focal density persists in the right upper lung zone and may be  related to focal alveolar edema with an area of focal pneumonia not excluded.  IMPRESSION: Unchanged cardiopulmonary appearance with persistent mild interstitial edema and bilateral pleural effusions.  Unchanged right upper lobe density, question focal alveolar edema versus infiltrate.   Original Report Authenticated By: Rhodia Albright, M.D.    Dg Chest Port 1 View  09/23/2012  *RADIOLOGY REPORT*  Clinical Data: Status post right central line placement  PORTABLE CHEST - 1 VIEW  Comparison: 09/23/2012 06:56 hours  Findings: The previously seen Swan-Ganz catheter has been removed and a new right jugular line placed.  Catheter tip is noted in the mid superior vena cava.  An endotracheal tube is now noted just above the aortic knob.  Nasogastric catheter is noted within the stomach.  Right-sided pleural effusion is again noted.  No pneumothorax is noted.  IMPRESSION: Status post right central line placement as described.  No complicating factors are noted. These results will be called to the ordering clinician or representative by the Radiologist Assistant, and communication documented in the PACS Dashboard.   Original Report Authenticated By: Alcide Clever, M.D.    Dg Abd Portable 1v  09/24/2012  *RADIOLOGY REPORT*  Clinical Data: Recheck a feeding tube placement.  PORTABLE ABDOMEN - 1 VIEW  Comparison: 09/24/2012.  Findings: A metallic tipped feeding tube is present coiled in the proximal stomach.  A nasogastric tube is also present with tip and side port in the stomach.  The distal end of a central venous catheter is seen projecting over the mid superior vena cava.  Oral contrast material is noted throughout the colon.  No pathologic distension of small bowel.  No gross evidence of pneumoperitoneum. Midline abdominal skin staples are noted.  Surgical clips project over the mid lumbar spine.  There is extensive multifocal interstitial and airspace disease throughout the visualized lungs bilaterally, concerning  for either multilobar pneumonia or pulmonary edema.  This appears increased over prior examinations.  IMPRESSION: 1.  Support apparatus, as above.  The tip of the feeding tube is currently coiled in the proximal stomach. 2.  Worsening multifocal interstitial and airspace disease throughout the lungs bilaterally concerning for multilobar pneumonia or inflammatory process such as ARDS.  Alternatively, these findings could reflect cardiogenic pulmonary edema, however, the heart does not appear particularly enlarged.  Original Report Authenticated By: Trudie Reed, M.D.    Dg Abd Portable 1v  09/24/2012  *RADIOLOGY REPORT*  Clinical Data: Panda tube placement.  Verify placement.  Ventilator dependence.  PORTABLE ABDOMEN - 1 VIEW  Comparison: CT 09/21/2012  Findings: Nasogastric tube and feeding tube are in place, tips of both overlying the region of the mid stomach.  Surgical clips overlie the mid abdomen.  There is residual contrast in the colon following recent CT exam.  Patchy densities are identified within the lung bases.  There is dense opacity at the left lung base, suggesting increased atelectasis or consolidation since prior CT exam.  IMPRESSION:  1.  Feeding tube tip overlying the level of the mid stomach. 2.  Increased left lung base density since prior CT exam.   Original Report Authenticated By: Norva Pavlov, M.D.     ASSESSMENT / PLAN:  PULMONARY A:  Acute respiratory failure in setting of AAA repair Hx of smoking and presumed COPD. B alveolar infiltrates, cardiogenic edema vs ALI vs PNA  P:   -pressure support wean when FiO2 and Peep needs decrease post op, ? Some effect of increase intra-abd pressure post-closure -BD's prn -f/u CXR  CARDIOVASCULAR A:  Ruptured AAA s/p repair 4/23. PAF >> back to sinus 4/23. Hemorrhagic shock >> resolved. Hx of HTN, PVD.  P:  - goal even fluid balance - PRN hydralazine, lopressor for HTN - repeat Echo next week (post-wound vac and  hopefully post-extubation)  RENAL A:   Hypokalemia, resolved  P:   - f/u and replace electrolytes as needed - monitor renal fx, urine outpt  GASTROINTESTINAL A:   Severe protein calorie malnutrition.  P:   - TNA started 4/24  HEMATOLOGIC A:  Acute blood loss anemia from AAA rupture, and critical illness. Thrombocytopenia >> likely from critical illness and hx of ETOH. P:  - f/u CBC - transfuse for Hb < 7 or active bleeding  INFECTIOUS A:  GNR in urine and blood cultures speciated as Salmonella P:   - continue zosyn  ENDOCRINE A:  Hyperglycemia >> likely 2nd to acute stress. HbA1C 5.8 from 4/23. P:   - SSI   NEUROLOGIC A:  Sedation. Hx of ETOH. P:   - Continuous sedation protocol - Daily WUA - thiamine, folic acid  CC time 40 minutes.  Levy Pupa, MD, PhD 09/25/2012, 11:10 AM [facility] [phone] or if no answer [phone]

## 2012-09-25 NOTE — Progress Notes (Addendum)
[facility]  Post-op  Intra-abdominal Surgery note  Date of Surgery: 09/21/2012 - 09/24/2012  Surgeon(s): Sherren Kerns, MD  1 Day Post-Op Procedure(s): Removal of Abdominal wound VAC, and Abdominal Closure ANEURYSM ABDOMINAL AORTIC REPAIR  EMBOLECTOMY left CFA  PATCH ANGIOPLASTY  History of Present Illness  Charles [name] is a 67 y.o. male who is  up s/p Procedure(s): Removal of Abdominal wound VAC, and Abdominal Closure Pt is doing well. intubated and not responsive currently sedated.  IMAGING: Dg Chest Port 1 View  09/24/2012  *RADIOLOGY REPORT*  Clinical Data: Hypoxia  PORTABLE CHEST - 1 VIEW  Comparison: 09/24/2012  Findings: Endotracheal tube is appropriately positioned.  NG tube and feeding tube terminate below the level of the hemidiaphragms are not included in the field of view.  Left upper quadrant clips are partly visualized.  Right IJ central line tip terminates over the distal SVC.  Mild enlargement of the cardiomediastinal silhouette again noted with interval increase in perihilar ill- defined airspace opacities, Kerley B lines, and pleural effusions.  IMPRESSION: Constellation of findings compatible with progressive pulmonary edema.   Original Report Authenticated By: Christiana Pellant, M.D.    Dg Chest Port 1 View  09/24/2012  *RADIOLOGY REPORT*  Clinical Data: Follow up pleural effusions  PORTABLE CHEST - 1 VIEW  Comparison: 09/23/2012  Findings: The right internal jugular CVP, endotracheal tube and nasogastric tube are unchanged in position.  The lung fields are hyperexpanded.  Heart and mediastinal contours are stable. Bilateral veiling density compatible with layering effusions are again appreciated and felt to be unchanged in degree in comparison with the prior exam. Some associated bibasilar atelectasis is suspected.  Haziness of the interstitial markings and prominence of the minor fissure suggests that this is be related to clearing edema  pattern.  Some more focal density persists in the right upper lung zone and may be related to focal alveolar edema with an area of focal pneumonia not excluded.  IMPRESSION: Unchanged cardiopulmonary appearance with persistent mild interstitial edema and bilateral pleural effusions.  Unchanged right upper lobe density, question focal alveolar edema versus infiltrate.   Original Report Authenticated By: Rhodia Albright, M.D.    Dg Chest Port 1 View  09/23/2012  *RADIOLOGY REPORT*  Clinical Data: Status post right central line placement  PORTABLE CHEST - 1 VIEW  Comparison: 09/23/2012 06:56 hours  Findings: The previously seen Swan-Ganz catheter has been removed and a new right jugular line placed.  Catheter tip is noted in the mid superior vena cava.  An endotracheal tube is now noted just above the aortic knob.  Nasogastric catheter is noted within the stomach.  Right-sided pleural effusion is again noted.  No pneumothorax is noted.  IMPRESSION: Status post right central line placement as described.  No complicating factors are noted. These results will be called to the ordering clinician or representative by the Radiologist Assistant, and communication documented in the PACS Dashboard.   Original Report Authenticated By: Alcide Clever, M.D.    Dg Abd Portable 1v  09/24/2012  *RADIOLOGY REPORT*  Clinical Data: Recheck a feeding tube placement.  PORTABLE ABDOMEN - 1 VIEW  Comparison: 09/24/2012.  Findings: A metallic tipped feeding tube is present coiled in the proximal stomach.  A nasogastric tube is also present with tip and side port in the stomach.  The distal end of a central venous catheter is seen projecting over the mid superior vena cava.  Oral contrast material is noted throughout the colon.  No pathologic distension of small bowel.  No gross evidence of pneumoperitoneum. Midline abdominal skin staples are noted.  Surgical clips project over the mid lumbar spine.  There is extensive multifocal  interstitial and airspace disease throughout the visualized lungs bilaterally, concerning for either multilobar pneumonia or pulmonary edema.  This appears increased over prior examinations.  IMPRESSION: 1.  Support apparatus, as above.  The tip of the feeding tube is currently coiled in the proximal stomach. 2.  Worsening multifocal interstitial and airspace disease throughout the lungs bilaterally concerning for multilobar pneumonia or inflammatory process such as ARDS.  Alternatively, these findings could reflect cardiogenic pulmonary edema, however, the heart does not appear particularly enlarged.   Original Report Authenticated By: Trudie Reed, M.D.    Dg Abd Portable 1v  09/24/2012  *RADIOLOGY REPORT*  Clinical Data: Panda tube placement.  Verify placement.  Ventilator dependence.  PORTABLE ABDOMEN - 1 VIEW  Comparison: CT 09/21/2012  Findings: Nasogastric tube and feeding tube are in place, tips of both overlying the region of the mid stomach.  Surgical clips overlie the mid abdomen.  There is residual contrast in the colon following recent CT exam.  Patchy densities are identified within the lung bases.  There is dense opacity at the left lung base, suggesting increased atelectasis or consolidation since prior CT exam.  IMPRESSION:  1.  Feeding tube tip overlying the level of the mid stomach. 2.  Increased left lung base density since prior CT exam.   Original Report Authenticated By: Norva Pavlov, M.D.     Significant Diagnostic Studies: CBC Lab Results  Component Value Date   WBC 9.9 09/25/2012   HGB 8.7* 09/25/2012   HCT 24.6* 09/25/2012   MCV 89.5 09/25/2012   PLT 65* 09/25/2012    BMET    Component Value Date/Time   NA 139 09/25/2012 0500   K 3.5 09/25/2012 0500   CL 108 09/25/2012 0500   CO2 26 09/25/2012 0500   GLUCOSE 131* 09/25/2012 0500   BUN 18 09/25/2012 0500   CREATININE 0.60 09/25/2012 0500   CALCIUM 7.7* 09/25/2012 0500   GFRNONAA >90 09/25/2012 0500   GFRAA >90 09/25/2012  0500    COAG Lab Results  Component Value Date   INR 1.23 09/23/2012   INR 1.32 09/22/2012   INR 1.41 09/22/2012   No results found for this basename: PTT    I/O last 3 completed shifts: In: 4765.7 [I.V.:1858.5; NG/GT:180; IV Piggyback:1020] Out: 3830 [Urine:2130; Emesis/NG output:400; Drains:1300]    Physical Examination BP Readings from Last 3 Encounters:  09/25/12 132/70  09/25/12 132/70  09/25/12 132/70   Temp Readings from Last 3 Encounters:  09/25/12 98.6 F (37 C) Oral  09/25/12 98.6 F (37 C) Oral  09/25/12 98.6 F (37 C) Oral   SpO2 Readings from Last 3 Encounters:  09/24/12 98%  09/24/12 98%  09/24/12 98%   Pulse Readings from Last 3 Encounters:  09/25/12 79  09/25/12 79  09/25/12 79    General: A&O x 3, WDWN male in NAD Pulmonary: normal non-labored breathing , without Rales,  rhonchi,  wheezing Cardiac: Heart rate : irregularly irregular a fib ,  Abdomen:firm non distended, this stature patient. Abdominal wound:clean, dry, intact  Neurologicintubated Speech itubated Swelling bilateral upper extremities. Vascular Exam:BLE warm and well perfused Extremities without ischemic changes, no Gangrene, no cellulitis; no open wounds;   LOWER EXTREMITY PULSES           RIGHT  LEFT      POSTERIOR TIBIAL  biphasic by Doppler  biphasic by Doppler and p       DORSALIS PEDIS      ANTERIOR TIBIAL  biphasic by Doppler  biphasic by Doppler        Assessment/Plan: Charles [name] is a 67 y.o. male who is 1 Day Post-Op Procedure(s): Removal of Abdominal wound VAC, and Abdominal Closure S/P open AAA repair with right femoral artery angioplasty    Pending kub for panda placement. Increased patients peep to 10cm,due to SP02=85-87%.   Clinton Gallant [facility] [phone] 09/25/2012 7:24 AM  Continued sedation on ventilator following removal of abdominal VAC and closure Patient hematocrit today 24.6%-will transfuse 2 units  packed red blood cells Vent management per ccm Renal function is stable and clotting studies normal  Continues to make slow progress

## 2012-09-25 NOTE — Progress Notes (Signed)
Pts sats dropping.  Did recruitment measure and increased Fio2 to 100.  ABG obtained.  Sats holding at 94.  X ray was taken.

## 2012-09-25 NOTE — Progress Notes (Signed)
The bedside nurse unable to place the NG tube post pyloric.   Cont TNA. NG to be placed by IR under fluoro. Ordered placed.

## 2012-09-25 NOTE — Progress Notes (Signed)
This note also relates to the following rows which could not be included: Vent Mode - Cannot attach notes to unvalidated device data Vt Set - Cannot attach notes to unvalidated device data I Time - Cannot attach notes to unvalidated device data PEEP - Cannot attach notes to unvalidated device data Resp Rate Total - Cannot attach notes to unvalidated device data Ve High Alarm - Cannot attach notes to unvalidated device data Ve Low Alarm - Cannot attach notes to unvalidated device data Resp Rate High Alarm - Cannot attach notes to unvalidated device data Resp Rate Low Alarm - Cannot attach notes to unvalidated device data PEEP Low Alarm - Cannot attach notes to unvalidated device data   Pt's O2 sats down to 85%  Lavaged and suctioned and turned pt up to 70%  sats are now 91-92%

## 2012-09-25 NOTE — Progress Notes (Signed)
  Echocardiogram 2D Echocardiogram has been performed.  Charles [name], Charles [name] 09/25/2012, 2:57 PM

## 2012-09-25 NOTE — Progress Notes (Signed)
Hypoxia   CXR similar to previous with bilateral infiltrates concerning for PNA vs edema; afebrile, no leukocytosis; minimal secretions on suctioning;   Plan: lasix 80 mg; cont Zosyn; if febrile low threshold to start Vancomycin.

## 2012-09-25 NOTE — Progress Notes (Signed)
ANTIBIOTIC CONSULT NOTE - Follow-up  Pharmacy Consult for Zosyn Indication: UTI, bacteremia  No Known Allergies  Patient Measurements: Height: 5\' 4"  (162.6 cm) Weight: 129 lb 10.1 oz (58.8 kg) IBW/kg (Calculated) : 59.2  Vital Signs: Temp: 98.8 F (37.1 C) (04/26 1207) Temp src: Oral (04/26 1207) BP: 147/77 mmHg (04/26 1159) Pulse Rate: 93 (04/26 1159) Intake/Output from previous day: 04/25 0701 - 04/26 0700 In: 3246.7 [I.V.:1079.5; NG/GT:120; IV Piggyback:820; TPN:1227.2] Out: 1795 [Urine:1345; Emesis/NG output:250; Drains:200] Intake/Output from this shift: Total I/O In: 79 [I.V.:24; NG/GT:30; IV Piggyback:25] Out: 360 [Urine:260; Emesis/NG output:100]  Labs:  Recent Labs  09/23/12 0400  09/23/12 1700 09/24/12 0320 09/24/12 1600 09/25/12 0500  WBC 10.2  --   --  10.0  --  9.9  HGB 8.8*  < >  --  8.5* 9.6* 8.7*  PLT 64*  --   --  67*  --  65*  CREATININE 0.66  < > 0.65 0.64  --  0.60  < > = values in this interval not displayed. Estimated Creatinine Clearance: 73.5 ml/min (by C-G formula based on Cr of 0.6).  Medical History: Past Medical History  Diagnosis Date  . Hypercholesteremia   . Collapsed lung     s/p fall, rib fracture   Assessment: 66yom presented to [facility] with ruptured AAA and afib. Tx'ed to [facility] and he is now POD#3 open AAA repair. He continues on zosyn for GNR in his urine and blood. Renal function stable.  4/22 CTX x 1 dose 4/23 Ancef >>4/23 4/23 Zosyn>>  4/22 urine>>GNR 4/22 blood>>salmonella  Goal of Therapy:  Appropriate zosyn dosing  Plan:  1) Zosyn 3.375g IV q8 (4 hour infusion) 2) Follow renal function, cultures, abx de-escalation  Daylene Vandenbosch, Drake Leach 09/25/2012,12:14 PM

## 2012-09-26 ENCOUNTER — Inpatient Hospital Stay (HOSPITAL_COMMUNITY): Payer: Medicare Other

## 2012-09-26 LAB — BASIC METABOLIC PANEL
BUN: 20 mg/dL (ref 6–23)
Calcium: 8 mg/dL — ABNORMAL LOW (ref 8.4–10.5)
Chloride: 102 mEq/L (ref 96–112)
Creatinine, Ser: 0.69 mg/dL (ref 0.50–1.35)
GFR calc non Af Amer: >90 mL/min (ref >90)
Glucose, Bld: 135 mg/dL — ABNORMAL HIGH (ref 70–99)
Potassium: 3.6 mEq/L (ref 3.5–5.1)
Sodium: 138 mEq/L (ref 135–145)

## 2012-09-26 LAB — BLOOD GAS, ARTERIAL
Bicarbonate: 27.1 mEq/L — ABNORMAL HIGH (ref 20.0–24.0)
Drawn by: 252031
PEEP: 15 cmH2O
Patient temperature: 98.6
RATE: 14 resp/min
pH, Arterial: 7.46 — ABNORMAL HIGH (ref 7.350–7.450)
pO2, Arterial: 64.5 mmHg — ABNORMAL LOW (ref 80.0–100.0)

## 2012-09-26 LAB — POCT I-STAT, CHEM 8
BUN: 17 mg/dL (ref 6–23)
Chloride: 101 mEq/L (ref 96–112)
Creatinine, Ser: 0.6 mg/dL (ref 0.50–1.35)
Glucose, Bld: 133 mg/dL — ABNORMAL HIGH (ref 70–99)
Hemoglobin: 12.2 g/dL — ABNORMAL LOW (ref 13.0–17.0)
Potassium: 3.5 mEq/L (ref 3.5–5.1)
Sodium: 139 mEq/L (ref 135–145)

## 2012-09-26 LAB — TYPE AND SCREEN
ABO/RH(D): A POS
Antibody Screen: NEGATIVE
Unit division: 0

## 2012-09-26 LAB — GLUCOSE, CAPILLARY
Glucose-Capillary: 118 mg/dL — ABNORMAL HIGH (ref 70–99)
Glucose-Capillary: 106 mg/dL — ABNORMAL HIGH (ref 70–99)

## 2012-09-26 LAB — CBC
HCT: 32.5 % — ABNORMAL LOW (ref 39.0–52.0)
Hemoglobin: 11.5 g/dL — ABNORMAL LOW (ref 13.0–17.0)
MCHC: 35.4 g/dL (ref 30.0–36.0)
MCV: 85.8 fL (ref 78.0–100.0)
RDW: 16.9 % — ABNORMAL HIGH (ref 11.5–15.5)

## 2012-09-26 MED ORDER — MAGNESIUM SULFATE 40 MG/ML IJ SOLN
2.0000 g | Freq: Once | INTRAMUSCULAR | Status: AC
  Administered 2012-09-26: 2 g via INTRAVENOUS
  Filled 2012-09-26: qty 50

## 2012-09-26 MED ORDER — CLINIMIX E/DEXTROSE (5/15) 5 % IV SOLN
INTRAVENOUS | Status: AC
  Administered 2012-09-26: 17:00:00 via INTRAVENOUS
  Filled 2012-09-26: qty 2000

## 2012-09-26 MED ORDER — METOCLOPRAMIDE HCL 5 MG/ML IJ SOLN
10.0000 mg | Freq: Once | INTRAMUSCULAR | Status: AC
  Administered 2012-09-26: 10 mg via INTRAVENOUS
  Filled 2012-09-26: qty 2

## 2012-09-26 MED ORDER — POTASSIUM CHLORIDE 10 MEQ/50ML IV SOLN
10.0000 meq | INTRAVENOUS | Status: AC
  Administered 2012-09-26 (×4): 10 meq via INTRAVENOUS
  Filled 2012-09-26: qty 200

## 2012-09-26 MED ORDER — FUROSEMIDE 10 MG/ML IJ SOLN
60.0000 mg | Freq: Two times a day (BID) | INTRAMUSCULAR | Status: AC
  Administered 2012-09-26 (×2): 60 mg via INTRAVENOUS
  Filled 2012-09-26 (×2): qty 6

## 2012-09-26 NOTE — Progress Notes (Signed)
PULMONARY  / CRITICAL CARE MEDICINE  Name: Charles [name] MRN: [id number] DOB: 01-09-1946    ADMISSION DATE:  09/21/2012 CONSULTATION DATE:  09/22/12  REFERRING MD :  Darrick Penna PRIMARY SERVICE: Fields  CHIEF COMPLAINT:  Post AAA repair  BRIEF PATIENT DESCRIPTION:  67yo male smoker, ETOH tx from [place] 4/22 with ruptured AAA and Afib. Taken emergently to OR on arrival to [facility].  Pt remained vented post op with open abd and PCCM consulted for vent/medical mgmt.   SIGNIFICANT EVENTS:  4/23 OR -- AAA repair, Aorto left common iliac right common femoral bypass,aortic endarterectomy, right femoral endarterectomy profundaplasty, placement abdominal VAC 4/24 Off pressors, tolerating pressure support, TNA started  STUDIES: 4/23 CT abd/pelvis>>> rupturing AAA  LINES / TUBES: ETT 4/23>>> R IJ PA cath 4/23>>> 4/24 R radial artery 4/23>>> Rt IJ CVL 4.24 >>  CULTURES: blooc cultures x 2 4/23 >>> Salmonella  Urine 4/23>>> GNR >>>  ANTIBIOTICS: Ancef 4/22>>>4/23 Zosyn 4/23>>>  SUBJECTIVE: PEEP increased to 15 4/26 after desats and Fio2 up to 1.00. Underwent recruitments.  Lasix given with > 2L UOP, FiO2 needs improved.   VITAL SIGNS: Temp:  [98.1 F (36.7 C)-100 F (37.8 C)] 98.1 F (36.7 C) (04/27 0820) Pulse Rate:  [75-108] 81 (04/27 0900) Resp:  [12-25] 15 (04/27 0900) BP: (111-181)/(65-98) 123/67 mmHg (04/27 0900) SpO2:  [89 %-96 %] 96 % (04/27 0800) Arterial Line BP: (115-205)/(59-109) 130/62 mmHg (04/27 0800) FiO2 (%):  [5 %-100 %] 50.3 % (04/27 0900) Weight:  [56.2 kg (123 lb 14.4 oz)] 56.2 kg (123 lb 14.4 oz) (04/27 0500) HEMODYNAMICS:   VENTILATOR SETTINGS: Vent Mode:  [-] PRVC FiO2 (%):  [5 %-100 %] 50.3 % Set Rate:  [12 bmp-14 bmp] 12 bmp Vt Set:  [600 mL] 600 mL PEEP:  [10 cmH20-15 cmH20] 15 cmH20 Plateau Pressure:  [19 cmH20-29 cmH20] 24 cmH20 INTAKE / OUTPUT: Intake/Output     04/26 0701 - 04/27 0700 04/27 0701 - 04/28 0700   I.V. (mL/kg) 923 (16.4)  32 (0.6)   Blood 350    NG/GT 120 30   IV Piggyback 250 100   TPN 1680.4 140   Total Intake(mL/kg) 3323.4 (59.1) 302 (5.4)   Urine (mL/kg/hr) 4585 (3.4) 145 (0.9)   Emesis/NG output 250 (0.2) 100 (0.7)   Drains     Total Output 4835 245   Net -1511.6 +57          PHYSICAL EXAMINATION: General: No distress Neuro: Sedated, awakens easily HEENT: ETT in place Cardiovascular: regular Lungs: prolonged exhalation, no wheeze Abdomen: distended, wound vac in place Ext: no edema  LABS:  Recent Labs Lab 09/21/12 2214  09/22/12 0845  09/22/12 1200 09/22/12 1443  09/22/12 2000 09/23/12 0400  09/24/12 0320  09/24/12 1918 09/25/12 0500 09/25/12 1644 09/25/12 2336 09/26/12 0315 09/26/12 0438  HGB 10.9*  < > 9.3*  --  9.1*  --   < >  --  8.8*  < > 8.5*  < >  --  8.7*  --  12.2*  --  11.5*  WBC 12.9*  --  13.4*  --   --   --   --   --  10.2  --  10.0  --   --  9.9  --   --   --  11.4*  PLT 150  --  80*  --   --   --   --   --  64*  --  67*  --   --  65*  --   --   --  65*  NA 121*  < > 135  --  136  --   < >  --  136  < > 139  --   --  139  --  139  --  138  K 3.5  < > 3.5  --  3.4*  --   < >  --  3.5  < > 3.9  --   --  3.5  --  3.5  --  3.6  CL 82*  --  100  --  102  --   < >  --  104  < > 109  --   --  108  --  101  --  102  CO2 27  --  23  --  23  --   < >  --  25  < > 27  --   --  26  --   --   --  29  GLUCOSE 133*  < > 157*  --  148*  --   < >  --  102*  < > 128*  --   --  131*  --  133*  --  135*  BUN 12  --  11  --  12  --   < >  --  17  < > 19  --   --  18  --  17  --  20  CREATININE 0.44*  --  0.49*  --  0.52  --   < >  --  0.66  < > 0.64  --   --  0.60  --  0.60  --  0.69  CALCIUM 8.3*  --  6.6*  --  6.5*  --   < >  --  6.6*  < > 7.4*  --   --  7.7*  --   --   --  8.0*  MG 1.6  --  1.2*  --   --   --   --   --  2.1  --  2.3  --   --  2.1  --   --   --  1.8  PHOS  --   --   --   --   --   --   --   --   --   --  1.9*  --   --  2.6  --   --   --  4.4  AST 75*  --  57*  --    --   --   --   --  93*  --  88*  --   --   --   --   --   --   --   ALT 40  --  27  --   --   --   --   --  35  --  34  --   --   --   --   --   --   --   ALKPHOS 76  --  63  --   --   --   --   --  39  --  40  --   --   --   --   --   --   --   BILITOT 0.9  --  1.9*  --   --   --   --   --  0.7  --  0.5  --   --   --   --   --   --   --   PROT 6.6  --  4.8*  --   --   --   --   --  4.2*  --  4.2*  --   --   --   --   --   --   --   ALBUMIN 2.4*  --  2.4*  --   --   --   --   --  1.7*  --  1.5*  --   --   --   --   --   --   --   APTT  --   --  39*  --   --   --   --   --   --   --   --   --   --   --   --   --   --   --   INR  --   --  1.41  --  1.32  --   --   --  1.23  --   --   --   --   --   --   --   --   --   TROPONINI  --   --  <0.30  --   --  <0.30  --  <0.30  --   --   --   --   --   --   --   --   --   --   PHART  --   < >  --   < >  --   --   --   --   --   < >  --   --  7.421  --  7.384  --  7.460*  --   PCO2ART  --   < >  --   < >  --   --   --   --   --   < >  --   --  33.9*  --  38.6  --  38.6  --   PO2ART  --   < >  --   < >  --   --   --   --   --   < >  --   --  52.0*  --  60.0*  --  64.5*  --   < > = values in this interval not displayed.  Recent Labs Lab 09/25/12 1209 09/25/12 1537 09/25/12 2015 09/26/12 0416 09/26/12 0823  GLUCAP 129* 108* 111* 104* 118*    CXR:  09/26/2012  *RADIOLOGY REPORT*  Clinical Data: Endotracheal tube  PORTABLE CHEST - 1 VIEW  Comparison: 09/25/2012; 09/24/2012  Findings:  Grossly unchanged cardiac silhouette and mediastinal contours with atherosclerotic calcifications within the aortic arch.  Stable position of support apparatus. The guidewire remains within the enteric tube.  No pneumothorax.  Extensive bilateral coarse reticular opacities with relative area of consolidation about the bilateral mid lungs are grossly unchanged.  No new focal airspace opacities.  No definite pleural effusion or pneumothorax.  Midline skin staples overlie the  upper abdomen. Unchanged bones.  IMPRESSION: 1.  Stable positioning of support apparatus.  No pneumothorax. Note, the guidewire remains within the enteric tube. 2.  Persistent findings of multifocal infection superimposed on advanced emphysematous change.   Original Report Authenticated By: Tacey Ruiz, MD    TTE 09/25/12 >>  Study Conclusions - Left ventricle: The cavity size was normal. Wall thickness was normal. Systolic function was moderately reduced. The estimated ejection fraction was in the range of 35% to 40%. Diffuse hypokinesis. - Aortic valve: Mild to moderate regurgitation. Valve area: 1.4cm^2(VTI). Valve area: 1.31cm^2 (Vmax). - Mitral valve: Calcified annulus. Mildly thickened leaflets . Moderate regurgitation. - Left atrium: The atrium was mildly dilated. - Right atrium: The atrium was mildly dilated. - Pulmonary arteries: Systolic pressure was severely increased. PA peak pressure: 71mm Hg (S).   ASSESSMENT / PLAN:  PULMONARY A:  Acute respiratory failure in setting of AAA repair Hx of smoking and presumed COPD. B alveolar infiltrates, cardiogenic edema vs ALI vs PNA Severe PAH (TTE 4/26 >> PASP ~ )  P:   -PEEP and FiO2 needs worse post op and 4/26, ? Some effect of increase intra-abd pressure post-closure but also B infiltrates superimposed on emphysema; suspect ALI due to bacteremia vs cardiogenic edema.  -continue empiric diuresis; lasix 80mg  another 2 doses today 4/27 -SBT when FiO2 and PEEP allow -BD's prn -f/u CXR  CARDIOVASCULAR A:  Ruptured AAA s/p repair 4/23. PAF >> back to sinus 4/23. Hemorrhagic shock >> resolved. Hx of HTN, PVD. PAH, likely secondary to underlying lung disease and to hypertensive heart dz, A Fib P:  - goal negative fluid balance given resp improvement w diuresis; will continue as BP and renal fxn tolerate - PRN hydralazine, lopressor for HTN  RENAL A:   Hypokalemia, resolved  P:   - diuresis as above - f/u and  replace electrolytes as needed - monitor renal fx, urine outpt  GASTROINTESTINAL A:   Severe protein calorie malnutrition.  P:   - TNA started 4/24  HEMATOLOGIC A:  Acute blood loss anemia from AAA rupture, and critical illness. Thrombocytopenia >> likely from critical illness and hx of ETOH. P:  - f/u CBC - transfuse for Hb < 7 or active bleeding; received PRBC 4/26 per VVS  INFECTIOUS A:  GNR in urine and blood cultures speciated as Salmonella P:   - continue zosyn  ENDOCRINE A:  Hyperglycemia >> likely 2nd to acute stress. HbA1C 5.8 from 4/23. P:   - SSI   NEUROLOGIC A:  Sedation. Hx of ETOH. P:   - Continuous sedation protocol - Daily WUA - thiamine, folic acid; should be able to d/c on 4/28  CC time 40 minutes.  Levy Pupa, MD, PhD 09/26/2012, 9:45 AM [facility] [phone] or if no answer [phone]

## 2012-09-26 NOTE — Progress Notes (Signed)
Patient ID: Charles [name], male   DOB: Feb 19, 1946, 67 y.o.   MRN: [id number] Vascular Surgery Progress Note  Subjective: 2 days post closure of abdominal wound-5 days post surgery for ruptured abdominal aortic aneurysm. Patient remains sedated on ventilator. On fentanyl and Versed drips. Diuresed well yesterday post Lasix. Blood pressure has been more stable following 2 units packed red blood cells yesterday. He remains on Zosyn for possible pneumonia. CCM managing drips and ventilator.  Objective:  Filed Vitals:   09/26/12 0719  BP: 113/67  Pulse: 76  Temp:   Resp: 15    General sedated on ventilator Abdomen relatively soft Right inguinal wound with serous drainage Both lower extremities well perfused   Labs:  Recent Labs Lab 09/25/12 0500 09/25/12 2336 09/26/12 0438  CREATININE 0.60 0.60 0.69    Recent Labs Lab 09/24/12 0320 09/25/12 0500 09/25/12 2336 09/26/12 0438  NA 139 139 139 138  K 3.9 3.5 3.5 3.6  CL 109 108 101 102  CO2 27 26  --  29  BUN 19 18 17 20   CREATININE 0.64 0.60 0.60 0.69  GLUCOSE 128* 131* 133* 135*  CALCIUM 7.4* 7.7*  --  8.0*    Recent Labs Lab 09/24/12 0320  09/25/12 0500 09/25/12 2336 09/26/12 0438  WBC 10.0  --  9.9  --  11.4*  HGB 8.5*  < > 8.7* 12.2* 11.5*  HCT 24.0*  < > 24.6* 36.0* 32.5*  PLT 67*  --  65*  --  65*  < > = values in this interval not displayed.  Recent Labs Lab 09/22/12 0845 09/22/12 1200 09/23/12 0400  INR 1.41 1.32 1.23    I/O last 3 completed shifts: In: 4577.4 [I.V.:1433; Blood:350; NG/GT:210; IV Piggyback:200] Out: 5635 [Urine:5235; Emesis/NG output:400]  Imaging: Dg Chest Port 1 View  09/25/2012  *RADIOLOGY REPORT*  Clinical Data: Decreased oxygen saturation  PORTABLE CHEST - 1 VIEW  Comparison: 09/25/2012  Findings: Severe diffuse bilateral airspace disease is unchanged and  may represent pneumonia or edema.  There remains some mild collapse of the lower lobes.  Support lines in good position  and unchanged.  Right jugular catheter tip in the SVC.  Endotracheal tube tip difficult to see due to overlying NG tube and feeding tube.  NG tube and feeding tube enters the stomach. Negative for pneumothorax.  IMPRESSION: Diffuse bilateral airspace disease is unchanged.  No new findings.   Original Report Authenticated By: Janeece Riggers, M.D.    Dg Chest Port 1 View  09/25/2012  *RADIOLOGY REPORT*  Clinical Data: Shortness of breath, evaluate pleural effusions  PORTABLE CHEST - 1 VIEW  Comparison: 09/24/2012; 09/23/2012; 09/22/2012  Findings: Grossly unchanged cardiac silhouette and mediastinal contours with atherosclerotic calcifications within the thoracic aorta.  Stable position of support apparatus.  No pneumothorax. The suspected decreased in size of right-sided pleural effusion and though note, the right costophrenic angle is excluded from view. Suspected trace / small left-sided pleural effusion.  Minimal improved aeration of the bilateral lung bases with persistent rather diffuse coarse reticular opacities primarily within the bilateral perihilar lung.  No new focal airspace opacities. Unchanged bones.  Vascular calcifications overlie the bilateral axilla.  IMPRESSION: 1.  Stable positioning of support apparatus.  No pneumothorax. 2. Overall findings suggestive of a minimally improved edema and atelectasis, though note, underlying infection (including atypical etiologies) is not excluded. 3.  Suspected minimal decrease in right-sided pleural effusion with possible minimal increase in left-sided effusion.   Original Report Authenticated By: Jonny Ruiz  Judithann Sheen, MD    Dg Chest Port 1 View  09/24/2012  *RADIOLOGY REPORT*  Clinical Data: Hypoxia  PORTABLE CHEST - 1 VIEW  Comparison: 09/24/2012  Findings: Endotracheal tube is appropriately positioned.  NG tube and feeding tube terminate below the level of the hemidiaphragms are not included in the field of view.  Left upper quadrant clips are partly visualized.   Right IJ central line tip terminates over the distal SVC.  Mild enlargement of the cardiomediastinal silhouette again noted with interval increase in perihilar ill- defined airspace opacities, Kerley B lines, and pleural effusions.  IMPRESSION: Constellation of findings compatible with progressive pulmonary edema.   Original Report Authenticated By: Christiana Pellant, M.D.    Dg Abd Portable 1v  09/25/2012  *RADIOLOGY REPORT*  Clinical Data: Evaluate feeding tube placement.  PORTABLE ABDOMEN - 1 VIEW  Comparison: 09/24/2012.  Findings: Metallic tip of feeding tube remains coiled in the proximal stomach.  Nasogastric tube is also present in the proximal stomach.  Oral contrast material is seen throughout the colon. There is a paucity of bowel gas.  No pneumoperitoneum.  Midline abdominal skin staples are noted.  Extensive interstitial and airspace disease is again noted throughout the lung bases bilaterally.  IMPRESSION: 1.  Tip of feeding tube remains in the proximal stomach, with additional findings, as above.   Original Report Authenticated By: Trudie Reed, M.D.    Dg Abd Portable 1v  09/24/2012  *RADIOLOGY REPORT*  Clinical Data: Recheck a feeding tube placement.  PORTABLE ABDOMEN - 1 VIEW  Comparison: 09/24/2012.  Findings: A metallic tipped feeding tube is present coiled in the proximal stomach.  A nasogastric tube is also present with tip and side port in the stomach.  The distal end of a central venous catheter is seen projecting over the mid superior vena cava.  Oral contrast material is noted throughout the colon.  No pathologic distension of small bowel.  No gross evidence of pneumoperitoneum. Midline abdominal skin staples are noted.  Surgical clips project over the mid lumbar spine.  There is extensive multifocal interstitial and airspace disease throughout the visualized lungs bilaterally, concerning for either multilobar pneumonia or pulmonary edema.  This appears increased over prior  examinations.  IMPRESSION: 1.  Support apparatus, as above.  The tip of the feeding tube is currently coiled in the proximal stomach. 2.  Worsening multifocal interstitial and airspace disease throughout the lungs bilaterally concerning for multilobar pneumonia or inflammatory process such as ARDS.  Alternatively, these findings could reflect cardiogenic pulmonary edema, however, the heart does not appear particularly enlarged.   Original Report Authenticated By: Trudie Reed, M.D.    Dg Abd Portable 1v  09/24/2012  *RADIOLOGY REPORT*  Clinical Data: Panda tube placement.  Verify placement.  Ventilator dependence.  PORTABLE ABDOMEN - 1 VIEW  Comparison: CT 09/21/2012  Findings: Nasogastric tube and feeding tube are in place, tips of both overlying the region of the mid stomach.  Surgical clips overlie the mid abdomen.  There is residual contrast in the colon following recent CT exam.  Patchy densities are identified within the lung bases.  There is dense opacity at the left lung base, suggesting increased atelectasis or consolidation since prior CT exam.  IMPRESSION:  1.  Feeding tube tip overlying the level of the mid stomach. 2.  Increased left lung base density since prior CT exam.   Original Report Authenticated By: Norva Pavlov, M.D.     Assessment/Plan:  POD #2   LOS: 5 days  s/p Procedure(s): Removal of Abdominal wound VAC, and Abdominal Closure  Continuing to make slow progress-diuresing Eder and slowly being weaned from ventilator. Being sedated with fentanyl and Versed Hematocrit stable today at 32% with white count 11,000 Renal function normal with potassium 3.6 Panda feeding tube still in stomach-to be manipulated fluoroscopically the day to try to get postpyloric and start tube feedings  Continue slow weaning efforts per critical care and hopefully begin tube feedings in the next 24 hours   Josephina Gip, MD 09/26/2012 8:03 AM

## 2012-09-26 NOTE — Progress Notes (Signed)
eLink Physician-Brief Progress Note Patient Name: Charles [name] DOB: Apr 20, 1946 MRN: [id number]  Date of Service  09/26/2012   HPI/Events of Note  Blood gas  eICU Interventions  Decreased rate from 14 to 12   Intervention Category Intermediate Interventions: OtherSandi Carne K. 09/26/2012, 3:49 AM

## 2012-09-26 NOTE — Progress Notes (Signed)
eLink Physician-Brief Progress Note Patient Name: Charles [name] DOB: 04-25-1946 MRN: [id number]  Date of Service  09/26/2012   HPI/Events of Note  Post pyloric tube attempted to be placed this am by fluoro, not successful but left in stomach to see if it would migrate with time   eICU Interventions  Gave 1 dose reglan to help with passage and ordered kub for 6 pm to evaluate   Intervention Category Intermediate Interventions: OtherCarolan Clines. 09/26/2012, 4:58 PM

## 2012-09-26 NOTE — Progress Notes (Signed)
PARENTERAL NUTRITION CONSULT NOTE - FOLLOW UP  Pharmacy Consult for TPN Indication: s/p AAA repair with open abdomen; 2 month hx of poor po intake with significant weight loss  No Known Allergies  Patient Measurements: Height: 5\' 4"  (162.6 cm) Weight: 129 lb 10.1 oz (58.8 kg) IBW/kg (Calculated) : 59.2  Vital Signs: Temp: 99 F (37.2 C) (04/27 0400) Temp src: Oral (04/27 0400) BP: 111/71 mmHg (04/27 0500) Pulse Rate: 89 (04/27 0500) Intake/Output from previous day: 04/26 0701 - 04/27 0700 In: 2917.4 [I.V.:827; Blood:350; NG/GT:120; IV Piggyback:150; TPN:1470.4] Out: 4660 [Urine:4410; Emesis/NG output:250] Intake/Output from this shift:    Labs:  Recent Labs  09/24/12 0320  09/25/12 0500 09/25/12 2336 09/26/12 0438  WBC 10.0  --  9.9  --  11.4*  HGB 8.5*  < > 8.7* 12.2* 11.5*  HCT 24.0*  < > 24.6* 36.0* 32.5*  PLT 67*  --  65*  --  65*  < > = values in this interval not displayed.   Recent Labs  09/23/12 1700 09/24/12 0320 09/25/12 0500 09/25/12 2336 09/26/12 0438  NA 138 139 139 139 138  K 3.6 3.9 3.5 3.5 3.6  CL 107 109 108 101 102  CO2 25 27 26   --  29  GLUCOSE 106* 128* 131* 133* 135*  BUN 18 19 18 17 20   CREATININE 0.65 0.64 0.60 0.60 0.69  CALCIUM 7.3* 7.4* 7.7*  --  8.0*  MG  --  2.3 2.1  --  1.8  PHOS  --  1.9* 2.6  --  4.4  PROT  --  4.2*  --   --   --   ALBUMIN  --  1.5*  --   --   --   AST  --  88*  --   --   --   ALT  --  34  --   --   --   ALKPHOS  --  40  --   --   --   BILITOT  --  0.5  --   --   --   PREALBUMIN  --  3.9*  --   --   --   TRIG  --  89  --   --   --   CHOL  --  81  --   --   --    Estimated Creatinine Clearance: 73.5 ml/min (by C-G formula based on Cr of 0.69).    Recent Labs  09/25/12 1209 09/25/12 1537 09/26/12 0416  GLUCAP 129* 108* 104*    Insulin Requirements in the past 24 hours:  8 units SSI/24h  Current Nutrition:  NPO Clinimix E 5/15 at 70 cc/hr (Goal is 70 cc/hr to provide 84gm protein and avg  1380 Kcal with IV fats at 9cc/hr on MWF)  Nutritional Goals:  1350-1500 kCal, 80-90 grams of protein per day  Assessment: Pt presented to [facility] with 2 month hx of poor PO intake and weight loss due to mouth pain, abdominal pain, and low back pain. Down to 99# from usual weight of 120#. Found to have ruptured AAA, AFib and transferred to [facility]. Now s/p AAA repair, pod#4.  GI: s/p abd closure 4/25. NGT output 0.25L/24h. Panda in place, tip coiled in proximal stomach- no plans to feed until post-pyloric.    Endo: No hx DM. CBGs controlled on SSI alone, noted SSI requirement seems steady while on TPN. Current GIR is ~3 mcg/kg/min.  Lytes: Lytes nml. Corr Ca 9.1, target is K ~  4, Mg ~2 and Phos ~3 post-op to prevent ileus. IVF =NS at  51ml/hr. Fluid requirement ~1.4-1.8 L/d.  Renal: No hx renal dz, Cr < 1 and uop 3.1 ml/kg/hr (lasix), i/o neg 1.7L. Acid-base status ok.  Pulm: COPD, heavy smoker. FiO2 0.50  Cards: AFib, controlled on Metoprolol. VSS, off pressors.  Hepatobil: Hx EtOH abuse, thiamine and folic acid daily. AST trending down, other LFTs nml. Trig and Chol nml. Prealbumin low, as expected with ongoing inflammation, malnutrition and post-op status; would expect slow trend up.  Neuro: Fentanyl and Versed drips, RASS -2.  ID: Zosyn #5 for GNR UTI, salmonella bacteremia (pcn sens). Tm 100, WBC incr to 11.4, noted low threshold to add Vanc.  Best Practices: SCDs, mouth care   TPN Access: R-IJ CVC  TPN day#: 3  Plan:  - Will give 4 runs KCL and 2 gm IV Mg this AM - Continue Clinimix E 5/15 at 70 cc/hr (goal rate) - Will plan to continue SSI/CBG checks for now-- if SSI requirement rises, would consider adding insulin to TPN bag. - Will supplement MVI, trace elements and IV fats on MWF due to ongoing national shortages. - Will f/up in AM  Thanks, Anivea Velasques K. Allena Katz, PharmD, BCPS.  Clinical Pharmacist Pager [phone]. 09/26/2012 7:07 AM

## 2012-09-27 ENCOUNTER — Inpatient Hospital Stay (HOSPITAL_COMMUNITY): Payer: Medicare Other

## 2012-09-27 ENCOUNTER — Encounter (HOSPITAL_COMMUNITY): Payer: Self-pay | Admitting: Vascular Surgery

## 2012-09-27 LAB — COMPREHENSIVE METABOLIC PANEL
ALT: 32 U/L (ref 0–53)
AST: 83 U/L — ABNORMAL HIGH (ref 0–37)
Albumin: 1.6 g/dL — ABNORMAL LOW (ref 3.5–5.2)
Chloride: 99 mEq/L (ref 96–112)
Creatinine, Ser: 0.69 mg/dL (ref 0.50–1.35)
Sodium: 138 mEq/L (ref 135–145)
Total Bilirubin: 3.5 mg/dL — ABNORMAL HIGH (ref 0.3–1.2)

## 2012-09-27 LAB — CBC
HCT: 30.1 % — ABNORMAL LOW (ref 39.0–52.0)
Hemoglobin: 10.6 g/dL — ABNORMAL LOW (ref 13.0–17.0)
MCHC: 35.2 g/dL (ref 30.0–36.0)
MCV: 85.8 fL (ref 78.0–100.0)
WBC: 9 10*3/uL (ref 4.0–10.5)

## 2012-09-27 LAB — GLUCOSE, CAPILLARY
Glucose-Capillary: 111 mg/dL — ABNORMAL HIGH (ref 70–99)
Glucose-Capillary: 112 mg/dL — ABNORMAL HIGH (ref 70–99)
Glucose-Capillary: 121 mg/dL — ABNORMAL HIGH (ref 70–99)
Glucose-Capillary: 88 mg/dL (ref 70–99)

## 2012-09-27 LAB — CHOLESTEROL, TOTAL: Cholesterol: 69 mg/dL (ref 0–200)

## 2012-09-27 LAB — TRIGLYCERIDES: Triglycerides: 77 mg/dL (ref <150)

## 2012-09-27 LAB — DIFFERENTIAL
Lymphocytes Relative: 16 % (ref 12–46)
Monocytes Absolute: 0.3 10*3/uL (ref 0.1–1.0)
Monocytes Relative: 4 % (ref 3–12)
Neutro Abs: 7.1 10*3/uL (ref 1.7–7.7)

## 2012-09-27 LAB — PHOSPHORUS: Phosphorus: 4.4 mg/dL (ref 2.3–4.6)

## 2012-09-27 MED ORDER — MAGNESIUM SULFATE 40 MG/ML IJ SOLN
2.0000 g | Freq: Once | INTRAMUSCULAR | Status: AC
  Administered 2012-09-27: 2 g via INTRAVENOUS
  Filled 2012-09-27: qty 50

## 2012-09-27 MED ORDER — FAT EMULSION 20 % IV EMUL
240.0000 mL | INTRAVENOUS | Status: AC
  Administered 2012-09-27: 240 mL via INTRAVENOUS
  Filled 2012-09-27: qty 250

## 2012-09-27 MED ORDER — POTASSIUM CHLORIDE 10 MEQ/50ML IV SOLN
10.0000 meq | INTRAVENOUS | Status: DC
  Administered 2012-09-27 (×3): 10 meq via INTRAVENOUS
  Filled 2012-09-27: qty 150

## 2012-09-27 MED ORDER — POTASSIUM CHLORIDE 10 MEQ/50ML IV SOLN
INTRAVENOUS | Status: AC
  Administered 2012-09-27: 10 meq via INTRAVENOUS
  Filled 2012-09-27: qty 150

## 2012-09-27 MED ORDER — TRACE MINERALS CR-CU-F-FE-I-MN-MO-SE-ZN IV SOLN
INTRAVENOUS | Status: AC
  Administered 2012-09-27: 18:00:00 via INTRAVENOUS
  Filled 2012-09-27: qty 2000

## 2012-09-27 MED ORDER — POTASSIUM CHLORIDE 10 MEQ/50ML IV SOLN
10.0000 meq | INTRAVENOUS | Status: AC
  Administered 2012-09-27 (×2): 10 meq via INTRAVENOUS

## 2012-09-27 MED ORDER — FUROSEMIDE 10 MG/ML IJ SOLN
60.0000 mg | Freq: Two times a day (BID) | INTRAMUSCULAR | Status: AC
  Administered 2012-09-27 (×2): 60 mg via INTRAVENOUS
  Filled 2012-09-27 (×2): qty 6

## 2012-09-27 MED ORDER — POTASSIUM CHLORIDE 20 MEQ/15ML (10%) PO LIQD
40.0000 meq | Freq: Once | ORAL | Status: AC
  Administered 2012-09-27: 40 meq via ORAL
  Filled 2012-09-27: qty 30

## 2012-09-27 NOTE — Progress Notes (Signed)
PULMONARY  / CRITICAL CARE MEDICINE  Name: Charles [name] MRN: [id number] DOB: [birth year]    ADMISSION DATE:  09/21/2012 CONSULTATION DATE:  09/22/12  REFERRING MD :  Darrick Penna PRIMARY SERVICE: Fields  CHIEF COMPLAINT:  Post AAA repair  BRIEF PATIENT DESCRIPTION:  67yo male smoker, ETOH tx from [place] 4/22 with ruptured AAA and Afib. Taken emergently to OR on arrival to [facility].  Pt remained vented post op with open abd and PCCM consulted for vent/medical mgmt.   SIGNIFICANT EVENTS:  4/23 OR -- AAA repair, Aorto left common iliac right common femoral bypass,aortic endarterectomy, right femoral endarterectomy profundaplasty, placement abdominal VAC 4/24 Off pressors, tolerating pressure support, TNA started  STUDIES: 4/23 CT abd/pelvis>>> rupturing AAA  LINES / TUBES: ETT 4/23>>> R IJ PA cath 4/23>>> 4/24 R radial artery 4/23>>> Rt IJ CVL 4.24 >>  CULTURES: blooc cultures x 2 4/23 >>> Salmonella  Urine 4/23>>> GNR >>> Salmonella  ANTIBIOTICS: Ancef 4/22>>>4/23 Zosyn 4/23>>>  SUBJECTIVE: PEEP to 12, FiO2 0.50 >> desats this am with WUA  VITAL SIGNS: Temp:  [98.5 F (36.9 C)-100.2 F (37.9 C)] 98.5 F (36.9 C) (04/28 0805) Pulse Rate:  [38-115] 103 (04/28 0824) Resp:  [10-23] 20 (04/28 0824) BP: (100-171)/(54-99) 162/89 mmHg (04/28 0824) SpO2:  [92 %-100 %] 97 % (04/28 0824) Arterial Line BP: (106-191)/(59-106) 130/65 mmHg (04/28 0800) FiO2 (%):  [49.6 %-99.9 %] 50 % (04/28 0824) HEMODYNAMICS:   VENTILATOR SETTINGS: Vent Mode:  [-] PRVC FiO2 (%):  [49.6 %-99.9 %] 50 % Set Rate:  [12 bmp] 12 bmp Vt Set:  [600 mL] 600 mL PEEP:  [12 cmH20] 12 cmH20 Plateau Pressure:  [19 cmH20-26 cmH20] 26 cmH20 INTAKE / OUTPUT: Intake/Output     04/27 0701 - 04/28 0700 04/28 0701 - 04/29 0700   I.V. (mL/kg) 643 (11.4) 32 (0.6)   Blood     NG/GT 120 30   IV Piggyback 450 100   TPN 1680 140   Total Intake(mL/kg) 2893 (51.5) 302 (5.4)   Urine (mL/kg/hr) 4910 (3.6) 165  (1.3)   Emesis/NG output 275 (0.2)    Total Output 5185 165   Net -2292 +137          PHYSICAL EXAMINATION: General: No distress Neuro: Sedated, awakens easily HEENT: ETT in place Cardiovascular: regular Lungs: prolonged exhalation, no wheeze Abdomen: distended, wound vac in place Ext: no edema  LABS:  Recent Labs Lab 09/21/12 2214  09/22/12 0845  09/22/12 1200 09/22/12 1443  09/22/12 2000 09/23/12 0400  09/24/12 0320  09/24/12 1918 09/25/12 0500 09/25/12 1644 09/25/12 2336 09/26/12 0315 09/26/12 0438 09/27/12 0315  HGB 10.9*  < > 9.3*  --  9.1*  --   < >  --  8.8*  < > 8.5*  < >  --  8.7*  --  12.2*  --  11.5* 10.6*  WBC 12.9*  --  13.4*  --   --   --   --   --  10.2  --  10.0  --   --  9.9  --   --   --  11.4* 9.0  PLT 150  --  80*  --   --   --   --   --  64*  --  67*  --   --  65*  --   --   --  65* 72*  NA 121*  < > 135  --  136  --   < >  --  136  < > 139  --   --  139  --  139  --  138 138  K 3.5  < > 3.5  --  3.4*  --   < >  --  3.5  < > 3.9  --   --  3.5  --  3.5  --  3.6 3.4*  CL 82*  --  100  --  102  --   < >  --  104  < > 109  --   --  108  --  101  --  102 99  CO2 27  --  23  --  23  --   < >  --  25  < > 27  --   --  26  --   --   --  29 33*  GLUCOSE 133*  < > 157*  --  148*  --   < >  --  102*  < > 128*  --   --  131*  --  133*  --  135* 134*  BUN 12  --  11  --  12  --   < >  --  17  < > 19  --   --  18  --  17  --  20 25*  CREATININE 0.44*  --  0.49*  --  0.52  --   < >  --  0.66  < > 0.64  --   --  0.60  --  0.60  --  0.69 0.69  CALCIUM 8.3*  --  6.6*  --  6.5*  --   < >  --  6.6*  < > 7.4*  --   --  7.7*  --   --   --  8.0* 8.1*  MG 1.6  --  1.2*  --   --   --   --   --  2.1  --  2.3  --   --  2.1  --   --   --  1.8 1.9  PHOS  --   --   --   --   --   --   --   --   --   < > 1.9*  --   --  2.6  --   --   --  4.4 4.4  AST 75*  --  57*  --   --   --   --   --  93*  --  88*  --   --   --   --   --   --   --  83*  ALT 40  --  27  --   --   --   --   --   35  --  34  --   --   --   --   --   --   --  32  ALKPHOS 76  --  63  --   --   --   --   --  39  --  40  --   --   --   --   --   --   --  62  BILITOT 0.9  --  1.9*  --   --   --   --   --  0.7  --  0.5  --   --   --   --   --   --   --  3.5*  PROT 6.6  --  4.8*  --   --   --   --   --  4.2*  --  4.2*  --   --   --   --   --   --   --  5.2*  ALBUMIN 2.4*  --  2.4*  --   --   --   --   --  1.7*  --  1.5*  --   --   --   --   --   --   --  1.6*  APTT  --   --  39*  --   --   --   --   --   --   --   --   --   --   --   --   --   --   --   --   INR  --   --  1.41  --  1.32  --   --   --  1.23  --   --   --   --   --   --   --   --   --   --   TROPONINI  --   --  <0.30  --   --  <0.30  --  <0.30  --   --   --   --   --   --   --   --   --   --   --   PHART  --   < >  --   < >  --   --   --   --   --   < >  --   --  7.421  --  7.384  --  7.460*  --   --   PCO2ART  --   < >  --   < >  --   --   --   --   --   < >  --   --  33.9*  --  38.6  --  38.6  --   --   PO2ART  --   < >  --   < >  --   --   --   --   --   < >  --   --  52.0*  --  60.0*  --  64.5*  --   --   < > = values in this interval not displayed.  Recent Labs Lab 09/26/12 1534 09/26/12 1956 09/27/12 0003 09/27/12 0307 09/27/12 0810  GLUCAP 106* 126* 111* 135* 111*    CXR:  09/27/2012  *RADIOLOGY REPORT*  Clinical Data: Ventilator dependence.  PORTABLE CHEST - 1 VIEW  Comparison: Multiple recent previous exams.  Findings: Lungs are hyperexpanded as before.  The diffuse interstitial opacity at the bases is persistent with areas of associated alveolar opacity, as before.  Endotracheal tube tip is approximately 5.2 cm above the base of the carina. The NG tube passes into the stomach although the distal tip position is not included on the film. A feeding tube passes into the stomach although the distal tip position is not included on the film. Right IJ central line tip projects over the proximal SVC. Telemetry leads overlie the chest.  Radiopaque material is seen over the lower right neck and supraclavicular region which is presumably secondary to contrast spill related to fluoro guided feeding tube placement yesterday.  IMPRESSION: Endotracheal tube  tip is 5.2 cm above the base of the carina.  Feeding  tube is coiled in the hypopharyngeal region before passing down  the  esophagus. This may require repositioning to remove  the loop or coil.  No substantial interval change in the diffuse interstitial and patchy alveolar opacities bilaterally.   Original Report Authenticated By: Kennith Center, M.D.    TTE 09/25/12 >>  Study Conclusions - Left ventricle: The cavity size was normal. Wall thickness was normal. Systolic function was moderately reduced. The estimated ejection fraction was in the range of 35% to 40%. Diffuse hypokinesis. - Aortic valve: Mild to moderate regurgitation. Valve area: 1.4cm^2(VTI). Valve area: 1.31cm^2 (Vmax). - Mitral valve: Calcified annulus. Mildly thickened leaflets . Moderate regurgitation. - Left atrium: The atrium was mildly dilated. - Right atrium: The atrium was mildly dilated. - Pulmonary arteries: Systolic pressure was severely increased. PA peak pressure: 71mm Hg (S).   ASSESSMENT / PLAN:  PULMONARY A:  Acute respiratory failure in setting of AAA repair Hx of smoking and presumed COPD. B alveolar infiltrates, cardiogenic edema vs ALI vs PNA >> improving 4/28 with diuresis Severe PAH (TTE 4/26 >> PASP ~ )  P:   -PEEP and FiO2 needs worse post op and 4/26, ? Some effect of increase intra-abd pressure post-closure but also B infiltrates superimposed on emphysema; suspect ALI due to bacteremia vs cardiogenic edema.  -continue empiric diuresis, negative 2.3L yesterday 4/27; lasix 60mg  another 2 doses today 4/28 -SBT when FiO2 and PEEP allow, has desaturated with WUA last two days 4/27 and 28 -BD's prn -f/u CXR  CARDIOVASCULAR A:  Ruptured AAA s/p repair 4/23. PAF >> back to  sinus 4/23. Hemorrhagic shock >> resolved. Hx of HTN, PVD. PAH, likely secondary to underlying lung disease and to hypertensive heart dz, A Fib P:  - goal negative fluid balance given resp improvement w diuresis; will continue as BP and renal fxn tolerate - PRN hydralazine, lopressor for HTN - further PAH w/u later once stabilized  RENAL A:   Hypokalemia, resolved  P:   - diuresis as above - f/u and replace electrolytes as needed - monitor renal fx, urine outpt  GASTROINTESTINAL A:   Severe protein calorie malnutrition.  P:   - TNA started 4/24  HEMATOLOGIC A:  Acute blood loss anemia from AAA rupture, and critical illness. Thrombocytopenia >> likely from critical illness and hx of ETOH. P:  - f/u CBC - transfuse for Hb < 7 or active bleeding; received PRBC 4/26 per [facility]  INFECTIOUS A:  GNR in urine and blood cultures speciated as Salmonella P:   - continue zosyn  ENDOCRINE A:  Hyperglycemia >> likely 2nd to acute stress. HbA1C 5.8 from 4/23. P:   - SSI   NEUROLOGIC A:  Sedation. Hx of ETOH. P:   - Continuous sedation protocol - Daily WUA - thiamine, folic acid; d/c on 4/28  CC time 40 minutes.  Levy Pupa, MD, PhD 09/27/2012, 9:19 AM [facility] [phone] or if no answer [phone]

## 2012-09-27 NOTE — Progress Notes (Signed)
[facility]  Subjective  - Sedated on vent.  Had problems with hypertension and desats over the weekend when trying to wean.  Some right groin drainage over the weekend now resolved.    Objective 162/89 103 98.5 F (36.9 C) (Oral) 20 97%  Intake/Output Summary (Last 24 hours) at 09/27/12 1030 Last data filed at 09/27/12 0900  Gross per 24 hour  Intake   2707 ml  Output   4955 ml  Net  -2248 ml   Abomen: soft no drainage Extremities: right groin clean no erythema or drainage,doppler signals in feet  Assessment/Planning: POD 5 post ruptured aneurysm, POD 3 closure of abdomen overall stable still vent dependent.  Severe protein calorie malnutrition panda tube still in stomach. Will consider tube feeds when post pyloric.  Would need to start with trophic feeds first as he does not have much gut function currently as evidenced by oral contrast from 6 days ago still in his small bowel.  Hopefully some success on vent wean this week.  Blood loss anemia and thrombocytopenia stable.  Renal function good continuing to diurese.  FIELDS,CHARLES E 09/27/2012 10:30 AM --  Laboratory Lab Results:  Recent Labs  09/26/12 0438 09/27/12 0315  WBC 11.4* 9.0  HGB 11.5* 10.6*  HCT 32.5* 30.1*  PLT 65* 72*   BMET  Recent Labs  09/26/12 0438 09/27/12 0315  NA 138 138  K 3.6 3.4*  CL 102 99  CO2 29 33*  GLUCOSE 135* 134*  BUN 20 25*  CREATININE 0.69 0.69  CALCIUM 8.0* 8.1*    COAG Lab Results  Component Value Date   INR 1.23 09/23/2012   INR 1.32 09/22/2012   INR 1.41 09/22/2012   No results found for this basename: PTT

## 2012-09-27 NOTE — Progress Notes (Signed)
UR Completed.  Charles [name] [name] [phone] 09/27/2012  

## 2012-09-27 NOTE — Progress Notes (Addendum)
PARENTERAL NUTRITION CONSULT NOTE - FOLLOW UP  Pharmacy Consult for TPN Indication: s/p AAA repair with open abdomen; 2 month hx of poor po intake with significant weight loss  No Known Allergies  Patient Measurements: Height: 5\' 4"  (162.6 cm) Weight: 123 lb 14.4 oz (56.2 kg) IBW/kg (Calculated) : 59.2  Vital Signs: Temp: 98.8 F (37.1 C) (04/28 0357) Temp src: Oral (04/28 0357) BP: 132/77 mmHg (04/28 0700) Pulse Rate: 76 (04/28 0700) Intake/Output from previous day: 04/27 0701 - 04/28 0700 In: 2698 [I.V.:618; NG/GT:120; IV Piggyback:350; TPN:1610] Out: 5185 [Urine:4910; Emesis/NG output:275] Intake/Output from this shift:    Labs:  Recent Labs  09/25/12 0500 09/25/12 2336 09/26/12 0438 09/27/12 0315  WBC 9.9  --  11.4* 9.0  HGB 8.7* 12.2* 11.5* 10.6*  HCT 24.6* 36.0* 32.5* 30.1*  PLT 65*  --  65* 72*     Recent Labs  09/25/12 0500 09/25/12 2336 09/26/12 0438 09/27/12 0315  NA 139 139 138 138  K 3.5 3.5 3.6 3.4*  CL 108 101 102 99  CO2 26  --  29 33*  GLUCOSE 131* 133* 135* 134*  BUN 18 17 20  25*  CREATININE 0.60 0.60 0.69 0.69  CALCIUM 7.7*  --  8.0* 8.1*  MG 2.1  --  1.8 1.9  PHOS 2.6  --  4.4 4.4  PROT  --   --   --  5.2*  ALBUMIN  --   --   --  1.6*  AST  --   --   --  83*  ALT  --   --   --  32  ALKPHOS  --   --   --  62  BILITOT  --   --   --  3.5*  TRIG  --   --   --  77  CHOL  --   --   --  69   Estimated Creatinine Clearance: 70.3 ml/min (by C-G formula based on Cr of 0.69).    Recent Labs  09/26/12 1956 09/27/12 0003 09/27/12 0307  GLUCAP 126* 111* 135*    Insulin Requirements in the past 24 hours:  2 units SSI/24h  Current Nutrition:  NPO Clinimix E 5/15 at 70 cc/hr (Goal is 70 cc/hr to provide 84gm protein and avg 1380 Kcal with IV fats at 9cc/hr on MWF)  Nutritional Goals:  1350-1500 kCal, 80-90 grams of protein per day  Assessment: Pt presented to [facility] with 2 month hx of poor PO intake and weight loss due to mouth  pain, abdominal pain, and low back pain. Down to 99# from usual weight of 120#. Found to have ruptured AAA, AFib and transferred to [facility]. Now s/p AAA repair, pod#6.  GI: s/p abd closure 4/25. NGT output 254ml/24h. Panda in place, tip coiled in proximal stomach- no plans to feed until post-pyloric.    Endo: No hx DM. CBGs controlled on SSI alone, low needs. Current GIR is ~3 mcg/kg/min.  Lytes: K low, noted 3 runs of K administered per MD. Mg slightly low. Corr Ca 9.1, target is K ~ 4, Mg ~2 and Phos ~3 post-op to prevent ileus. IVF =NS at  14ml/hr. Fluid requirement ~1.4-1.8 L/d.  Renal: No hx renal dz, Cr < 1 and uop 3.7 ml/kg/hr (lasix), i/o neg 2.3L Acid-base status ok.  Pulm: COPD, heavy smoker. FiO2 0.50  Cards: AFib, controlled on Metoprolol. VSS, off pressors.  Hepatobil: Hx EtOH abuse, thiamine and folic acid daily. AST trending down, Alb low, Tbil  elevated at 3.5 Trig and Chol nml. Prealbumin low, as expected with ongoing inflammation, malnutrition and post-op status; would expect slow trend up.  Neuro: Fentanyl and Versed drips, RASS -2.  ID: Zosyn #[id number] for GNR UTI, salmonella bacteremia (pcn sens). Tm 100.2, WBC nml, 9  Best Practices: SCDs, mouth care   TPN Access: R-IJ CVC  TPN day#: 4  Plan:  - Will give 3 runs KCL in addition for total of 6 runs today and 2 gm IV Mg this AM - Continue Clinimix E 5/15 at 70 cc/hr (goal rate) - Will plan to continue SSI/CBG checks for now-- if SSI requirement rises, would consider adding insulin to TPN bag. - Will supplement MVI, trace elements and IV fats on MWF due to ongoing national shortages. - Will f/up BMET, Mg and Prealbumin in AM - Will f/up progress with Panda tube advancing/EN  Thanks, Laith Antonelli K. Allena Katz, PharmD, BCPS.  Clinical Pharmacist Pager [phone]. 09/27/2012 8:08 AM

## 2012-09-28 ENCOUNTER — Inpatient Hospital Stay (HOSPITAL_COMMUNITY): Payer: Medicare Other

## 2012-09-28 DIAGNOSIS — A029 Salmonella infection, unspecified: Secondary | ICD-10-CM

## 2012-09-28 LAB — POCT I-STAT 3, ART BLOOD GAS (G3+)
Acid-Base Excess: 11 mmol/L — ABNORMAL HIGH (ref 0.0–2.0)
Bicarbonate: 35.6 mEq/L — ABNORMAL HIGH (ref 20.0–24.0)
Patient temperature: 99.8
pH, Arterial: 7.458 — ABNORMAL HIGH (ref 7.350–7.450)

## 2012-09-28 LAB — TYPE AND SCREEN: ABO/RH(D): A POS

## 2012-09-28 LAB — BASIC METABOLIC PANEL
BUN: 29 mg/dL — ABNORMAL HIGH (ref 6–23)
CO2: 34 mEq/L — ABNORMAL HIGH (ref 19–32)
Chloride: 97 mEq/L (ref 96–112)
Creatinine, Ser: 0.77 mg/dL (ref 0.50–1.35)
Potassium: 3.8 mEq/L (ref 3.5–5.1)

## 2012-09-28 LAB — PROTIME-INR
INR: 1.11 (ref 0.00–1.49)
Prothrombin Time: 14.2 seconds (ref 11.6–15.2)

## 2012-09-28 LAB — GLUCOSE, CAPILLARY
Glucose-Capillary: 104 mg/dL — ABNORMAL HIGH (ref 70–99)
Glucose-Capillary: 120 mg/dL — ABNORMAL HIGH (ref 70–99)
Glucose-Capillary: 123 mg/dL — ABNORMAL HIGH (ref 70–99)
Glucose-Capillary: 112 mg/dL — ABNORMAL HIGH (ref 70–99)

## 2012-09-28 LAB — CBC
Hemoglobin: 10.7 g/dL — ABNORMAL LOW (ref 13.0–17.0)
MCH: 30.1 pg (ref 26.0–34.0)
Platelets: 94 10*3/uL — ABNORMAL LOW (ref 150–400)
RBC: 3.55 MIL/uL — ABNORMAL LOW (ref 4.22–5.81)
WBC: 9.2 10*3/uL (ref 4.0–10.5)

## 2012-09-28 LAB — MAGNESIUM: Magnesium: 2 mg/dL (ref 1.5–2.5)

## 2012-09-28 LAB — PROCALCITONIN: Procalcitonin: 2.74 ng/mL

## 2012-09-28 MED ORDER — FUROSEMIDE 10 MG/ML IJ SOLN
60.0000 mg | Freq: Two times a day (BID) | INTRAMUSCULAR | Status: AC
  Administered 2012-09-28 (×2): 60 mg via INTRAVENOUS
  Filled 2012-09-28 (×2): qty 6

## 2012-09-28 MED ORDER — DEXTROSE 5 % IV SOLN
2.0000 g | INTRAVENOUS | Status: DC
  Administered 2012-09-28 – 2012-10-06 (×9): 2 g via INTRAVENOUS
  Filled 2012-09-28 (×11): qty 2

## 2012-09-28 MED ORDER — ALBUTEROL SULFATE (5 MG/ML) 0.5% IN NEBU
2.5000 mg | INHALATION_SOLUTION | RESPIRATORY_TRACT | Status: DC | PRN
  Filled 2012-09-28: qty 0.5

## 2012-09-28 MED ORDER — CLINIMIX E/DEXTROSE (5/15) 5 % IV SOLN
INTRAVENOUS | Status: AC
  Administered 2012-09-28: 17:00:00 via INTRAVENOUS
  Filled 2012-09-28: qty 2000

## 2012-09-28 MED ORDER — METOCLOPRAMIDE HCL 5 MG/ML IJ SOLN
5.0000 mg | Freq: Four times a day (QID) | INTRAMUSCULAR | Status: DC | PRN
  Filled 2012-09-28: qty 1

## 2012-09-28 MED ORDER — METOCLOPRAMIDE HCL 5 MG/ML IJ SOLN
5.0000 mg | Freq: Four times a day (QID) | INTRAMUSCULAR | Status: DC
  Administered 2012-09-28 – 2012-10-02 (×15): 5 mg via INTRAVENOUS
  Administered 2012-10-02: 10 mg via INTRAVENOUS
  Administered 2012-10-02: 5 mg via INTRAVENOUS
  Filled 2012-09-28 (×24): qty 1

## 2012-09-28 MED ORDER — METRONIDAZOLE 500 MG PO TABS
500.0000 mg | ORAL_TABLET | Freq: Three times a day (TID) | ORAL | Status: DC
  Administered 2012-09-28 – 2012-10-01 (×9): 500 mg via ORAL
  Filled 2012-09-28 (×12): qty 1

## 2012-09-28 MED ORDER — IOHEXOL 300 MG/ML  SOLN
25.0000 mL | Freq: Once | INTRAMUSCULAR | Status: AC | PRN
  Administered 2012-09-28: 25 mL via INTRAVENOUS

## 2012-09-28 MED ORDER — IPRATROPIUM BROMIDE 0.02 % IN SOLN
0.5000 mg | Freq: Four times a day (QID) | RESPIRATORY_TRACT | Status: DC
  Administered 2012-09-28 – 2012-10-05 (×28): 0.5 mg via RESPIRATORY_TRACT
  Filled 2012-09-28 (×29): qty 2.5

## 2012-09-28 MED ORDER — ALBUTEROL SULFATE (5 MG/ML) 0.5% IN NEBU
2.5000 mg | INHALATION_SOLUTION | Freq: Four times a day (QID) | RESPIRATORY_TRACT | Status: DC
  Administered 2012-09-28 – 2012-10-05 (×28): 2.5 mg via RESPIRATORY_TRACT
  Filled 2012-09-28 (×28): qty 0.5

## 2012-09-28 NOTE — Consult Note (Signed)
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 male. With presentation to [facility] ED with  Complaints of mouth pain, abdominal and low back pain for 2 months. Pt was seen by Doran Stabler wit [facility] initially and per his note much of initial hx given by not just pt (who was apparently quite vague and a "poor historian" as well as pts friend at the bedside. Among other things pt gradually endrosed  abdominal pain and low back pain which been present for 2 months since a fall He has difficulty describing the character, frequency and intensity of the pain. He has been taking narcotics without relief. Since his fall he has had some difficulty ambulating and has been using a walker. He has had problems with left leg weakness for 2 months . He denies further falls. He also notes abdominal pain which he has great difficulty further characterizing. He denies nausea or vomiting. He has had increasing difficulty caring for himself and is no longer cooking for herself. His oral intake has been minimal and he has been losing weight over the last several weeks or longer.   During admission to [facility] pt developed marked abdominal pain, AF with RVR and was imaged with CT of the abdomen which showed:  Rupturing abdominal aortic aneurysm. nto the left psoas muscle with the  hemorrhage extending down the left psoas muscle into the left iliopsoas muscle.   He was transferred to [facility] for emergency surgery and taken to the OR by Dr. Darrick Penna,   Who performed   Aorto left common iliac right common femoral bypass with 16x8 mm dacron graft left limb to common iliac, right limb to common femoral, IMA ligated, right internal and external iliac ligated along aortic endarterectomy, right femoral endarterectomy profundaplasty,   The  patient had admission urine and blood cultures that have been positive for Salmonella in urine and 2/2 blood cultures  Of note Dr. Irene Limbo had wished to check an HIV at [facility] but it does not appear to have been done  We were consulted to assist in workup of this pt with Salmonella bacteremia and ruptured aneurysm. I discussed case at length with Dr Darrick Penna who stated that the aneurysm did not have appearance of mycotic aneurysm though obviously in setting of ongoing GNR bacteremia particular with organism known to cause mycotic aneurysms we need to consider this possibility if not then concern for secondary infection of the new graft that was placed to repair ruptured aneurysm.      Past Medical History  Diagnosis Date  . Hypercholesteremia   . Collapsed lung     s/p fall, rib fracture    Past Surgical History  Procedure Laterality Date  . Hernia repair    . Pleural scarification      collapsed lung  . Abdominal aortic aneurysm repair N/A 09/21/2012    Procedure: ANEURYSM ABDOMINAL AORTIC REPAIR;  Surgeon: Sherren Kerns, MD;  Location: [facility] OR;  Service: Vascular;  Laterality: N/A;  . Patch angioplasty  09/21/2012    Procedure: PATCH ANGIOPLASTY;  Surgeon: Sherren Kerns, MD;  Location: [facility] OR;  Service: Vascular;;  Hemashield Patch Angioplasty fo Right Common Femoral Artery  . Embolectomy Right 09/21/2012    Procedure: EMBOLECTOMY;  Surgeon: Sherren Kerns, MD;  Location: [facility]  OR;  Service: Vascular;  Laterality: Right;  Embolectomy of right leg.  . Wound debridement  09/24/2012    Procedure: Removal of Abdominal wound VAC, and Abdominal Closure;  Surgeon: Sherren Kerns, MD;  Location: [facility] OR;  Service: Vascular;;  ergies:   No Known Allergies   Medications: I have reviewed patients current medications as documented in Epic Anti-infectives   Start     Dose/Rate Route Frequency Ordered Stop   09/28/12 1700  cefTRIAXone (ROCEPHIN) 2 g in dextrose 5 % 50 mL IVPB     2 g 100 mL/hr  over 30 Minutes Intravenous Every 24 hours 09/28/12 1520     09/28/12 1600  metroNIDAZOLE (FLAGYL) tablet 500 mg     500 mg Oral 3 times per day 09/28/12 1520     09/22/12 1500  ceFAZolin (ANCEF) IVPB 1 g/50 mL premix  Status:  Discontinued     1 g 100 mL/hr over 30 Minutes Intravenous Every 8 hours 09/22/12 0842 09/22/12 0917   09/22/12 1000  piperacillin-tazobactam (ZOSYN) IVPB 3.375 g  Status:  Discontinued     3.375 g 12.5 mL/hr over 240 Minutes Intravenous 3 times per day 09/22/12 0932 09/28/12 1520   09/21/12 2345  [MAR Hold]  ceFAZolin (ANCEF) IVPB 2 g/50 mL premix  Status:  Discontinued     (On MAR Hold since 09/22/12 0004)  Comments:  Send with pt to OR   2 g 100 mL/hr over 30 Minutes Intravenous On call 09/21/12 2343 09/22/12 0842   09/21/12 1400  [MAR Hold]  cefTRIAXone (ROCEPHIN) 1 g in dextrose 5 % 50 mL IVPB  Status:  Discontinued     (On MAR Hold since 09/22/12 0004)   1 g 100 mL/hr over 30 Minutes Intravenous Every 24 hours 09/21/12 1257 09/22/12 0842      Social History:  reports that he has been smoking Cigarettes.  He has been smoking about 0.00 packs per day. He does not have any smokeless tobacco history on file. He reports that  drinks alcohol. He reports that he does not use illicit drugs.  Family History  Problem Relation Age of Onset  . Diabetes Mother   . Diabetes Father     As in HPI and primary teams notes otherwise 12 point review of systems is negative  Blood pressure 119/60, pulse 88, temperature 98.4 F (36.9 C), temperature source Oral, resp. rate 12, height 5\' 4"  (1.626 m), weight 123 lb 7.3 oz (56 kg), SpO2 99.00%. General: cachectic with marked temporal wasting, alert on ventilator HEENT: anicteric sclera,  EOMI, oropharynx clear and without exudate CVS tachycardic no murmur rubs or gallops Chest: clear to auscultation bilaterally, no wheezing, rales or rhonchi Abdomen:large midline incision is clean Extremities: no  clubbing or edema noted  bilaterally Skin: no rashes Neuro: nonfocal,  Results for orders placed during the hospital encounter of 09/21/12 (from the past 48 hour(s))  GLUCOSE, CAPILLARY     Status: Abnormal   Collection Time    09/26/12  7:56 PM      Result Value Range   Glucose-Capillary 126 (*) 70 - 99 mg/dL  GLUCOSE, CAPILLARY     Status: Abnormal   Collection Time    09/27/12 12:03 AM      Result Value Range   Glucose-Capillary 111 (*) 70 - 99 mg/dL  GLUCOSE, CAPILLARY     Status: Abnormal   Collection Time    09/27/12  3:07 AM      Result Value Range  Glucose-Capillary 135 (*) 70 - 99 mg/dL  COMPREHENSIVE METABOLIC PANEL     Status: Abnormal   Collection Time    09/27/12  3:15 AM      Result Value Range   Sodium 138  135 - 145 mEq/L   Potassium 3.4 (*) 3.5 - 5.1 mEq/L   Chloride 99  96 - 112 mEq/L   CO2 33 (*) 19 - 32 mEq/L   Glucose, Bld 134 (*) 70 - 99 mg/dL   BUN 25 (*) 6 - 23 mg/dL   Creatinine, Ser 1.61  0.50 - 1.35 mg/dL   Calcium 8.1 (*) 8.4 - 10.5 mg/dL   Total Protein 5.2 (*) 6.0 - 8.3 g/dL   Albumin 1.6 (*) 3.5 - 5.2 g/dL   AST 83 (*) 0 - 37 U/L   ALT 32  0 - 53 U/L   Alkaline Phosphatase 62  39 - 117 U/L   Total Bilirubin 3.5 (*) 0.3 - 1.2 mg/dL   GFR calc non Af Amer >90  >90 mL/min   GFR calc Af Amer >90  >90 mL/min   Comment:            The eGFR has been calculated     using the CKD EPI equation.     This calculation has not been     validated in all clinical     situations.     eGFR's persistently     <90 mL/min signify     possible Chronic Kidney Disease.  MAGNESIUM     Status: None   Collection Time    09/27/12  3:15 AM      Result Value Range   Magnesium 1.9  1.5 - 2.5 mg/dL  PHOSPHORUS     Status: None   Collection Time    09/27/12  3:15 AM      Result Value Range   Phosphorus 4.4  2.3 - 4.6 mg/dL  CBC     Status: Abnormal   Collection Time    09/27/12  3:15 AM      Result Value Range   WBC 9.0  4.0 - 10.5 K/uL   RBC 3.51 (*) 4.22 - 5.81 MIL/uL    Hemoglobin 10.6 (*) 13.0 - 17.0 g/dL   HCT 09.6 (*) 04.5 - 40.9 %   MCV 85.8  78.0 - 100.0 fL   MCH 30.2  26.0 - 34.0 pg   MCHC 35.2  30.0 - 36.0 g/dL   RDW 81.1 (*) 91.4 - 78.2 %   Platelets 72 (*) 150 - 400 K/uL   Comment: CONSISTENT WITH PREVIOUS RESULT  DIFFERENTIAL     Status: Abnormal   Collection Time    09/27/12  3:15 AM      Result Value Range   Neutrophils Relative 79 (*) 43 - 77 %   Neutro Abs 7.1  1.7 - 7.7 K/uL   Lymphocytes Relative 16  12 - 46 %   Lymphs Abs 1.5  0.7 - 4.0 K/uL   Monocytes Relative 4  3 - 12 %   Monocytes Absolute 0.3  0.1 - 1.0 K/uL   Eosinophils Relative 1  0 - 5 %   Eosinophils Absolute 0.1  0.0 - 0.7 K/uL   Basophils Relative 0  0 - 1 %   Basophils Absolute 0.0  0.0 - 0.1 K/uL  PREALBUMIN     Status: Abnormal   Collection Time    09/27/12  3:15 AM      Result Value  Range   Prealbumin 2.9 (*) 17.0 - 34.0 mg/dL  CHOLESTEROL, TOTAL     Status: None   Collection Time    09/27/12  3:15 AM      Result Value Range   Cholesterol 69  0 - 200 mg/dL  TRIGLYCERIDES     Status: None   Collection Time    09/27/12  3:15 AM      Result Value Range   Triglycerides 77  <150 mg/dL  CULTURE, RESPIRATORY (NON-EXPECTORATED)     Status: None   Collection Time    09/27/12  4:00 AM      Result Value Range   Specimen Description TRACHEAL ASPIRATE     Special Requests NONE     Gram Stain       Value: NO WBC SEEN     RARE SQUAMOUS EPITHELIAL CELLS PRESENT     RARE YEAST   Culture Culture reincubated for better growth     Report Status PENDING    GLUCOSE, CAPILLARY     Status: Abnormal   Collection Time    09/27/12  8:10 AM      Result Value Range   Glucose-Capillary 111 (*) 70 - 99 mg/dL  GLUCOSE, CAPILLARY     Status: Abnormal   Collection Time    09/27/12 11:57 AM      Result Value Range   Glucose-Capillary 127 (*) 70 - 99 mg/dL  GLUCOSE, CAPILLARY     Status: Abnormal   Collection Time    09/27/12  4:19 PM      Result Value Range    Glucose-Capillary 121 (*) 70 - 99 mg/dL  GLUCOSE, CAPILLARY     Status: Abnormal   Collection Time    09/27/12  7:44 PM      Result Value Range   Glucose-Capillary 112 (*) 70 - 99 mg/dL  GLUCOSE, CAPILLARY     Status: Abnormal   Collection Time    09/28/12 12:20 AM      Result Value Range   Glucose-Capillary 120 (*) 70 - 99 mg/dL  GLUCOSE, CAPILLARY     Status: Abnormal   Collection Time    09/28/12  3:54 AM      Result Value Range   Glucose-Capillary 104 (*) 70 - 99 mg/dL  POCT I-STAT 3, BLOOD GAS (G3+)     Status: Abnormal   Collection Time    09/28/12  4:02 AM      Result Value Range   pH, Arterial 7.458 (*) 7.350 - 7.450   pCO2 arterial 50.6 (*) 35.0 - 45.0 mmHg   pO2, Arterial 90.0  80.0 - 100.0 mmHg   Bicarbonate 35.6 (*) 20.0 - 24.0 mEq/L   TCO2 37  0 - 100 mmol/L   O2 Saturation 97.0     Acid-Base Excess 11.0 (*) 0.0 - 2.0 mmol/L   Patient temperature 99.8 F     Sample type ARTERIAL    BASIC METABOLIC PANEL     Status: Abnormal   Collection Time    09/28/12  4:30 AM      Result Value Range   Sodium 138  135 - 145 mEq/L   Potassium 3.8  3.5 - 5.1 mEq/L   Chloride 97  96 - 112 mEq/L   CO2 34 (*) 19 - 32 mEq/L   Glucose, Bld 129 (*) 70 - 99 mg/dL   BUN 29 (*) 6 - 23 mg/dL   Creatinine, Ser 1.47  0.50 - 1.35 mg/dL   Calcium  8.4  8.4 - 10.5 mg/dL   GFR calc non Af Amer >90  >90 mL/min   GFR calc Af Amer >90  >90 mL/min   Comment:            The eGFR has been calculated     using the CKD EPI equation.     This calculation has not been     validated in all clinical     situations.     eGFR's persistently     <90 mL/min signify     possible Chronic Kidney Disease.  MAGNESIUM     Status: None   Collection Time    09/28/12  4:30 AM      Result Value Range   Magnesium 2.0  1.5 - 2.5 mg/dL  PHOSPHORUS     Status: None   Collection Time    09/28/12  4:30 AM      Result Value Range   Phosphorus 4.4  2.3 - 4.6 mg/dL  CBC     Status: Abnormal   Collection Time      09/28/12  4:30 AM      Result Value Range   WBC 9.2  4.0 - 10.5 K/uL   RBC 3.55 (*) 4.22 - 5.81 MIL/uL   Hemoglobin 10.7 (*) 13.0 - 17.0 g/dL   HCT 78.2 (*) 95.6 - 21.3 %   MCV 87.3  78.0 - 100.0 fL   MCH 30.1  26.0 - 34.0 pg   MCHC 34.5  30.0 - 36.0 g/dL   RDW 08.6 (*) 57.8 - 46.9 %   Platelets 94 (*) 150 - 400 K/uL   Comment: PLATELET COUNT CONFIRMED BY SMEAR  PROTIME-INR     Status: None   Collection Time    09/28/12  4:30 AM      Result Value Range   Prothrombin Time 14.2  11.6 - 15.2 seconds   INR 1.11  0.00 - 1.49  PROCALCITONIN     Status: None   Collection Time    09/28/12  4:30 AM      Result Value Range   Procalcitonin 2.74     Comment:            Interpretation:     PCT > 2 ng/mL:     Systemic infection (sepsis) is likely,     unless other causes are known.     (NOTE)             ICU PCT Algorithm               Non ICU PCT Algorithm        ----------------------------     ------------------------------             PCT < 0.25 ng/mL                 PCT < 0.1 ng/mL         Stopping of antibiotics            Stopping of antibiotics           strongly encouraged.               strongly encouraged.        ----------------------------     ------------------------------           PCT level decrease by               PCT < 0.25 ng/mL           >=  80% from peak PCT           OR PCT 0.25 - 0.5 ng/mL          Stopping of antibiotics                                                 encouraged.         Stopping of antibiotics               encouraged.        ----------------------------     ------------------------------           PCT level decrease by              PCT >= 0.25 ng/mL           < 80% from peak PCT            AND PCT >= 0.5 ng/mL            Continuing antibiotics                                                  encouraged.           Continuing antibiotics                encouraged.        ----------------------------     ------------------------------          PCT level increase compared          PCT > 0.5 ng/mL             with peak PCT AND              PCT >= 0.5 ng/mL             Escalation of antibiotics                                              strongly encouraged.          Escalation of antibiotics            strongly encouraged.  TYPE AND SCREEN     Status: None   Collection Time    09/28/12  6:00 AM      Result Value Range   ABO/RH(D) A POS     Antibody Screen NEG     Sample Expiration 10/01/2012    GLUCOSE, CAPILLARY     Status: Abnormal   Collection Time    09/28/12  7:52 AM      Result Value Range   Glucose-Capillary 123 (*) 70 - 99 mg/dL   Comment 1 Documented in Chart     Comment 2 Notify RN    GLUCOSE, CAPILLARY     Status: None   Collection Time    09/28/12 12:09 PM      Result Value Range   Glucose-Capillary 95  70 - 99 mg/dL   Comment 1 Documented in Chart     Comment 2 Notify RN        Component Value Date/Time   SDES  TRACHEAL ASPIRATE 09/27/2012 0400   SPECREQUEST NONE 09/27/2012 0400   CULT Culture reincubated for better growth 09/27/2012 0400   REPTSTATUS PENDING 09/27/2012 0400   Dg Abd 1 View  09/28/2012  *RADIOLOGY REPORT*  Clinical Data: Feeding tube placement.  ABDOMEN - 1 VIEW  Comparison: Earlier today.  Findings: A single spot C-arm image of the abdomen demonstrates a feeding tube and injected contrast in the proximal small bowel. The feeding tube tip is at the ligament of Treitz.  IMPRESSION: Feeding tube tip at the ligament of Treitz, ready to use.   Original Report Authenticated By: Beckie Salts, M.D.    Dg Chest Port 1 View  09/28/2012  *RADIOLOGY REPORT*  Clinical Data: Evaluate endotracheal tubes and lines.  PORTABLE CHEST - 1 VIEW  Comparison: 09/27/2012.  Findings: Endotracheal tube tip 3.4 cm above the carina.  Right central line tip mid superior vena cava level.  Nasogastric tube and Panda tube are in place coursing below the diaphragm.  The tips are not included on the present exam.  No gross  pneumothorax.  Diffuse air space disease unchanged.  Calcified aorta.  IMPRESSION: Endotracheal tube tip 3.4 cm above the carina.  Right central line tip mid superior vena cava level.  Nasogastric tube and Panda tube are in place coursing below the diaphragm.  The tips are not included on the present exam. The Panda tube  may be curled in the region of the hypopharynx as previously noted.  Diffuse air space disease unchanged.   Original Report Authenticated By: Lacy Duverney, M.D.    Dg Chest Port 1 View  09/27/2012  *RADIOLOGY REPORT*  Clinical Data: Ventilator dependence.  PORTABLE CHEST - 1 VIEW  Comparison: Multiple recent previous exams.  Findings: Lungs are hyperexpanded as before.  The diffuse interstitial opacity at the bases is persistent with areas of associated alveolar opacity, as before.  Endotracheal tube tip is approximately 5.2 cm above the base of the carina. The NG tube passes into the stomach although the distal tip position is not included on the film. A feeding tube passes into the stomach although the distal tip position is not included on the film. Right IJ central line tip projects over the proximal SVC. Telemetry leads overlie the chest. Radiopaque material is seen over the lower right neck and supraclavicular region which is presumably secondary to contrast spill related to fluoro guided feeding tube placement yesterday.  IMPRESSION: Endotracheal tube tip is 5.2 cm above the base of the carina.  Feeding  tube is coiled in the hypopharyngeal region before passing down  the  esophagus. This may require repositioning to remove  the loop or coil.  No substantial interval change in the diffuse interstitial and patchy alveolar opacities bilaterally.   Original Report Authenticated By: Kennith Center, M.D.    Dg Abd Portable 1v  09/28/2012  *RADIOLOGY REPORT*  Clinical Data: Evaluate for ileus.  Panda tube placement.  PORTABLE ABDOMEN - 1 VIEW  Comparison: 09/26/2012  Findings: Feeding tube  terminates at the gastric antrum, possibly entering the duodenum.  Nasogastric tube terminates at the body of the stomach.  Midline laparotomy changes.  Contrast within normal caliber colon. No significant small bowel dilatation.  IMPRESSION: No change in position of feeding tube.  This is positioned within the gastric antrum or entering the duodenal bulb.   Original Report Authenticated By: Jeronimo Greaves, M.D.    Dg Abd Portable 1v  09/26/2012  *RADIOLOGY REPORT*  Clinical Data: Feeding tube placement.  PORTABLE  ABDOMEN - 1 VIEW  Comparison: 09/25/2012.  Findings: A metallic tip feeding tube now appears to lie in the antral prepyloric region of the stomach, or potentially within the duodenal bulb.  Nasogastric tube remains in the mid stomach. Midline surgical staples are noted.  Extensive interstitial opacities are again seen throughout the visualized lungs. Extensive atherosclerosis.  IMPRESSION: 1.  Tip of feeding tube is now either in the proximal duodenum or the antral prepyloric region of the stomach.   Original Report Authenticated By: Trudie Reed, M.D.      Recent Results (from the past 720 hour(s))  URINE CULTURE     Status: None   Collection Time    09/21/12 10:45 AM      Result Value Range Status   Specimen Description URINE, CLEAN CATCH   Final   Special Requests NONE   Final   Culture  Setup Time 09/21/2012 22:18   Final   Colony Count >=100,000 COLONIES/ML   Final   Culture     Final   Value: SALMONELLA SPECIES     Note: CRITICAL RESULT CALLED TO, READ BACK BY AND VERIFIED WITH:  EMILY S ON 4.27.14 @ 1700 BY FERGK   Report Status PENDING   Incomplete  CULTURE, BLOOD (ROUTINE X 2)     Status: None   Collection Time    09/21/12  1:46 PM      Result Value Range Status   Specimen Description BLOOD LEFT ARM   Final   Special Requests BOTTLES DRAWN AEROBIC ONLY 6CC   Final   Culture  Setup Time 09/22/2012 02:45   Final   Culture     Final   Value: SALMONELLA SPECIES     Note:  CRITICAL RESULT CALLED TO, READ BACK BY AND VERIFIED WITH: HUYSUI KSOR @1300  09/24/12 BY KRAWS FAXEDTO [place] [facility] 09/24/12     Note: CRITICAL RESULT CALLED TO, READ BACK BY AND VERIFIED WITH: Orpah Clinton AT [facility] AT 0535 09/22/12 BY FORSYTH K Performed at [facility]   Report Status PENDING   Incomplete   Organism ID, Bacteria SALMONELLA SPECIES   Final  CULTURE, BLOOD (ROUTINE X 2)     Status: None   Collection Time    09/21/12  1:51 PM      Result Value Range Status   Specimen Description BLOOD RIGHT ARM   Final   Special Requests BOTTLES DRAWN AEROBIC AND ANAEROBIC [facility] EACH   Final   Culture  Setup Time 09/22/2012 16:06   Final   Culture     Final   Value: SALMONELLA SPECIES     Note: SUSCEPTIBILITIES PERFORMED ON PREVIOUS CULTURE WITHIN THE LAST 5 DAYS. CRITICAL RESULT CALLED TO, READ BACK BY AND VERIFIED WITH: HUYIU KSOR @ 1300 09/24/12 BY KRAWS     Note: CRITICAL RESULT CALLED TO, READ BACK BY AND VERIFIED WITH: Orpah Clinton AT [facility] AT 0535 09/22/12 BY FORSYTH K Performed at [facility]   Report Status PENDING   Incomplete  MRSA PCR SCREENING     Status: None   Collection Time    09/21/12  3:30 PM      Result Value Range Status   MRSA by PCR NEGATIVE  NEGATIVE Final   Comment:            The GeneXpert MRSA Assay (FDA     approved for NASAL specimens     only), is one component of a     comprehensive MRSA  colonization     surveillance program. It is not     intended to diagnose MRSA     infection nor to guide or     monitor treatment for     MRSA infections.  CULTURE, RESPIRATORY (NON-EXPECTORATED)     Status: None   Collection Time    09/27/12  4:00 AM      Result Value Range Status   Specimen Description TRACHEAL ASPIRATE   Final   Special Requests NONE   Final   Gram Stain     Final   Value: NO WBC SEEN     RARE SQUAMOUS EPITHELIAL CELLS PRESENT     RARE YEAST   Culture Culture reincubated for better growth   Final   Report Status PENDING   Incomplete       Impression/Recommendation  67 year old with 2 month hx of malaise poor intake, weight loss --> temporal wasting, fall, abdominal pain now found to have ruptured aortic aneurysm and Salmonella bacteremia  #1 Salmonella bacteremia: While organism was found in urine we do not know if it secondarily seeded urine or was from urine originally. Regardless, given the predilection of this organism for vascular tissue and its propensity for causing mycotic aneurysm , chronicity of sssx, failure to thrive, we have high concern for possibility that his ruptured aneurysm was being driven by Salmonella infection, and even if it were not there is HIGH risk of secondary infection of the newly placed graft regardless  --I will narrow to Ceftriaxone 2g daily --I will repeat blood cultures --I will check HIV (Salmonella infection is in itself highly associated with HIV infection) ab, rna, cd4 --he will require protracted therapy the first 6-8 weeks of which should be either with IV abx vs highly bioavailable to fluoroquinolone, followed by protracted oral abx for months if not years   #2 ? VAP: infiltrates on cxr, resp cx only yeast --will add flagyl to ceftriaxone to keep some anerobic coverage for lungs with his ceftriaxone    Thank you so much for this interesting consult  [facility] [phone] (pager) [phone] (office) 09/28/2012, 4:59 PM  Paulette Blanch Dam 09/28/2012, 4:59 PM

## 2012-09-28 NOTE — Progress Notes (Signed)
PARENTERAL NUTRITION CONSULT NOTE - FOLLOW UP  Pharmacy Consult for TPN Indication: s/p AAA repair with open abdomen; 2 month hx of poor po intake with significant weight loss  No Known Allergies  Patient Measurements: Height: 5\' 4"  (162.6 cm) Weight: 123 lb 7.3 oz (56 kg) IBW/kg (Calculated) : 59.2  Vital Signs: Temp: 99.2 F (37.3 C) (04/29 0754) Temp src: Oral (04/29 0754) BP: 128/67 mmHg (04/29 0600) Pulse Rate: 59 (04/29 0600) Intake/Output from previous day: 04/28 0701 - 04/29 0700 In: 2930 [I.V.:555; NG/GT:135; IV Piggyback:500; TPN:1740] Out: 4445 [Urine:4395; Emesis/NG output:50] Intake/Output from this shift:    Labs:  Recent Labs  09/26/12 0438 09/27/12 0315 09/28/12 0430  WBC 11.4* 9.0 9.2  HGB 11.5* 10.6* 10.7*  HCT 32.5* 30.1* 31.0*  PLT 65* 72* 94*  INR  --   --  1.11     Recent Labs  09/26/12 0438 09/27/12 0315 09/28/12 0430  NA 138 138 138  K 3.6 3.4* 3.8  CL 102 99 97  CO2 29 33* 34*  GLUCOSE 135* 134* 129*  BUN 20 25* 29*  CREATININE 0.69 0.69 0.77  CALCIUM 8.0* 8.1* 8.4  MG 1.8 1.9 2.0  PHOS 4.4 4.4 4.4  PROT  --  5.2*  --   ALBUMIN  --  1.6*  --   AST  --  83*  --   ALT  --  32  --   ALKPHOS  --  62  --   BILITOT  --  3.5*  --   PREALBUMIN  --  2.9*  --   TRIG  --  77  --   CHOL  --  69  --    Estimated Creatinine Clearance: 70 ml/min (by C-G formula based on Cr of 0.77).    Recent Labs  09/27/12 1944 09/28/12 0020 09/28/12 0354  GLUCAP 112* 120* 104*    Insulin Requirements in the past 24 hours:  6 units Novolog SSI  Current Nutrition:  NPO Clinimix E 5/15 at 70 cc/hr (Goal is 75 cc/hr to provide 90gm protein and avg 1483 Kcal with IV fats at 10cc/hr on MWF)  Nutritional Goals:  1350-1500 kCal, 80-90 grams of protein per day  Assessment: Pt presented to [facility] with 2 month hx of poor PO intake and weight loss due to mouth pain, abdominal pain, and low back pain. Down to 99# from usual weight of 120#. Found to  have ruptured AAA, AFib and transferred to [facility]. Now s/p AAA repair, pod#7.  GI: s/p abd closure 4/25. NGT output 31ml/24h. Panda in place, tip coiled in proximal stomach- no plans to feed until post-pyloric. Pt on reglan.  Endo: No hx DM. CBGs controlled on SSI alone, low needs. Current GIR is ~3 mcg/kg/min.  Lytes: Lytes ok. Corr Ca 10.3. IVF =NS at  40ml/hr. Fluid requirement ~1.4-1.8 L/d.  Renal: No hx renal dz, Cr < 1 and uop 3.3 ml/kg/hr (lasix), i/o neg 1.3L Acid-base status ok.  Pulm: COPD, heavy smoker. FiO2 0.50  Cards: AFib, controlled on Metoprolol. VSS.  Hepatobil: Hx EtOH abuse. AST trending down, Alb low, Tbil elevated at 3.5. Trig and Chol nml. Prealbumin 2.9 - actually trend down from 4 days ago, expect to be low with ongoing inflammation, malnutrition and post-op status; will increase protein slightly to hopefully help with slow trend up.  Neuro: Fentanyl and Versed drips, RASS -2 (at goal).  ID: Zosyn #7 for GNR UTI, salmonella bacteremia (pcn sens). Tm 99.2, WBC nml.  Best  Practices: SCDs, mouth care, PPI IV  TPN Access: R-IJ CVC  TPN day#: 5  Plan:  - Increase Clinimix E 5/15 to 75 cc/hr (new goal rate) - Will plan to continue SSI/CBG checks for now. - Will supplement MVI, trace elements and IV fats on MWF due to ongoing national shortages. - Will f/u BMET in a.m. - may need K supplementation tomorrow. - Will f/u progress with Panda tube advancing/initiation of EN.  Thanks, Christoper Fabian, PharmD, BCPS Clinical pharmacist, pager [phone] 09/28/2012 8:09 AM

## 2012-09-28 NOTE — Progress Notes (Addendum)
PULMONARY  / CRITICAL CARE MEDICINE  Name: Charles [name] MRN: [id number] DOB: [birth year]    ADMISSION DATE:  09/21/2012 CONSULTATION DATE:  09/22/12  REFERRING MD :  Darrick Penna PRIMARY SERVICE: Fields  CHIEF COMPLAINT:  Post AAA repair  BRIEF PATIENT DESCRIPTION:  67yo male smoker, ETOH tx from [place] 4/22 with ruptured AAA and Afib. Taken emergently to OR on arrival to [facility].  Pt remained vented post op with open abd and PCCM consulted for vent/medical mgmt.   SIGNIFICANT EVENTS:  4/23 OR -- AAA repair, Aorto left common iliac right common femoral bypass,aortic endarterectomy, right femoral endarterectomy profundaplasty, placement abdominal VAC 4/24 Off pressors, tolerating pressure support, TNA started  STUDIES: 4/23 CT abd/pelvis>>> rupturing AAA  LINES / TUBES: ETT 4/23>>> R IJ PA cath 4/23>>> 4/24 R radial artery 4/23>>> Rt IJ CVL 4.24 >>  CULTURES: blooc cultures x 2 4/23 >>> Salmonella  Urine 4/23>>> GNR >>> Salmonella  ANTIBIOTICS: Ancef 4/22>>>4/23 Zosyn 4/23>>>  SUBJECTIVE: No change in PEEP, FiO2 needs or in CXR Additional 1.5 L negative last 24h Afebrile  VITAL SIGNS: Temp:  [98 F (36.7 C)-99.8 F (37.7 C)] 99.2 F (37.3 C) (04/29 0754) Pulse Rate:  [44-114] 82 (04/29 0830) Resp:  [11-19] 13 (04/29 0830) BP: (90-156)/(57-92) 156/90 mmHg (04/29 0830) SpO2:  [93 %-100 %] 98 % (04/29 0830) Arterial Line BP: (97-204)/(48-77) 152/67 mmHg (04/29 0800) FiO2 (%):  [49.6 %-50.2 %] 50 % (04/29 0830) Weight:  [56 kg (123 lb 7.3 oz)] 56 kg (123 lb 7.3 oz) (04/29 0100) HEMODYNAMICS:   VENTILATOR SETTINGS: Vent Mode:  [-] PRVC FiO2 (%):  [49.6 %-50.2 %] 50 % Set Rate:  [12 bmp] 12 bmp Vt Set:  [600 mL] 600 mL PEEP:  [12 cmH20] 12 cmH20 Plateau Pressure:  [21 cmH20-22 cmH20] 21 cmH20 INTAKE / OUTPUT: Intake/Output     04/28 0701 - 04/29 0700 04/29 0701 - 04/30 0700   I.V. (mL/kg) 576 (10.3) 42 (0.8)   NG/GT 180    IV Piggyback 500    TPN 1820 160    Total Intake(mL/kg) 3076 (54.9) 202 (3.6)   Urine (mL/kg/hr) 4560 (3.4) 135 (0.7)   Emesis/NG output 50 (0)    Total Output 4610 135   Net -1534 +67          PHYSICAL EXAMINATION: General: No distress Neuro: Sedated, awakens easily HEENT: ETT in place Cardiovascular: regular Lungs: prolonged exhalation, no wheeze Abdomen: distended, wound vac in place Ext: no edema  LABS:  Recent Labs Lab 09/21/12 2214  09/22/12 0845  09/22/12 1200 09/22/12 1443  09/22/12 2000 09/23/12 0400  09/24/12 0320  09/25/12 1644  09/26/12 0315 09/26/12 0438 09/27/12 0315 09/28/12 0402 09/28/12 0430  HGB 10.9*  < > 9.3*  --  9.1*  --   < >  --  8.8*  < > 8.5*  < >  --   < >  --  11.5* 10.6*  --  10.7*  WBC 12.9*  --  13.4*  --   --   --   --   --  10.2  --  10.0  < >  --   --   --  11.4* 9.0  --  9.2  PLT 150  --  80*  --   --   --   --   --  64*  --  67*  < >  --   --   --  65* 72*  --  94*  NA  121*  < > 135  --  136  --   < >  --  136  < > 139  < >  --   < >  --  138 138  --  138  K 3.5  < > 3.5  --  3.4*  --   < >  --  3.5  < > 3.9  < >  --   < >  --  3.6 3.4*  --  3.8  CL 82*  --  100  --  102  --   < >  --  104  < > 109  < >  --   < >  --  102 99  --  97  CO2 27  --  23  --  23  --   < >  --  25  < > 27  < >  --   --   --  29 33*  --  34*  GLUCOSE 133*  < > 157*  --  148*  --   < >  --  102*  < > 128*  < >  --   < >  --  135* 134*  --  129*  BUN 12  --  11  --  12  --   < >  --  17  < > 19  < >  --   < >  --  20 25*  --  29*  CREATININE 0.44*  --  0.49*  --  0.52  --   < >  --  0.66  < > 0.64  < >  --   < >  --  0.69 0.69  --  0.77  CALCIUM 8.3*  --  6.6*  --  6.5*  --   < >  --  6.6*  < > 7.4*  < >  --   --   --  8.0* 8.1*  --  8.4  MG 1.6  --  1.2*  --   --   --   --   --  2.1  --  2.3  < >  --   --   --  1.8 1.9  --  2.0  PHOS  --   --   --   --   --   --   --   --   --   --  1.9*  < >  --   --   --  4.4 4.4  --  4.4  AST 75*  --  57*  --   --   --   --   --  93*  --  88*  --   --   --    --   --  83*  --   --   ALT 40  --  27  --   --   --   --   --  35  --  34  --   --   --   --   --  32  --   --   ALKPHOS 76  --  63  --   --   --   --   --  39  --  40  --   --   --   --   --  62  --   --   BILITOT 0.9  --  1.9*  --   --   --   --   --  0.7  --  0.5  --   --   --   --   --  3.5*  --   --   PROT 6.6  --  4.8*  --   --   --   --   --  4.2*  --  4.2*  --   --   --   --   --  5.2*  --   --   ALBUMIN 2.4*  --  2.4*  --   --   --   --   --  1.7*  --  1.5*  --   --   --   --   --  1.6*  --   --   APTT  --   --  39*  --   --   --   --   --   --   --   --   --   --   --   --   --   --   --   --   INR  --   < > 1.41  --  1.32  --   --   --  1.23  --   --   --   --   --   --   --   --   --  1.11  TROPONINI  --   --  <0.30  --   --  <0.30  --  <0.30  --   --   --   --   --   --   --   --   --   --   --   PROCALCITON  --   --   --   --   --   --   --   --   --   --   --   --   --   --   --   --   --   --  2.74  PHART  --   < >  --   < >  --   --   --   --   --   < >  --   < > 7.384  --  7.460*  --   --  7.458*  --   PCO2ART  --   < >  --   < >  --   --   --   --   --   < >  --   < > 38.6  --  38.6  --   --  50.6*  --   PO2ART  --   < >  --   < >  --   --   --   --   --   < >  --   < > 60.0*  --  64.5*  --   --  90.0  --   < > = values in this interval not displayed.  Recent Labs Lab 09/27/12 1619 09/27/12 1944 09/28/12 0020 09/28/12 0354 09/28/12 0752  GLUCAP 121* 112* 120* 104* 123*    CXR:  09/28/2012  *RADIOLOGY REPORT*  Clinical Data: Evaluate endotracheal tubes and lines.  PORTABLE CHEST - 1 VIEW  Comparison: 09/27/2012.  Findings: Endotracheal tube tip 3.4 cm above the carina.  Right central line tip mid superior vena cava level.  Nasogastric tube and Panda tube are in place coursing below the diaphragm.  The tips are not included on the  present exam.  No gross pneumothorax.  Diffuse air space disease unchanged.  Calcified aorta.  IMPRESSION: Endotracheal tube tip 3.4 cm above  the carina.  Right central line tip mid superior vena cava level.  Nasogastric tube and Panda tube are in place coursing below the diaphragm.  The tips are not included on the present exam. The Panda tube  may be curled in the region of the hypopharynx as previously noted.  Diffuse air space disease unchanged.   Original Report Authenticated By: Lacy Duverney, M.D.     TTE 09/25/12 >>  Study Conclusions - Left ventricle: The cavity size was normal. Wall thickness was normal. Systolic function was moderately reduced. The estimated ejection fraction was in the range of 35% to 40%. Diffuse hypokinesis. - Aortic valve: Mild to moderate regurgitation. Valve area: 1.4cm^2(VTI). Valve area: 1.31cm^2 (Vmax). - Mitral valve: Calcified annulus. Mildly thickened leaflets . Moderate regurgitation. - Left atrium: The atrium was mildly dilated. - Right atrium: The atrium was mildly dilated. - Pulmonary arteries: Systolic pressure was severely increased. PA peak pressure: 71mm Hg (S).   ASSESSMENT / PLAN:  PULMONARY A:  Acute respiratory failure post AAA repair Hx of smoking and presumed COPD. B alveolar infiltrates, cardiogenic edema vs ALI vs PNA >> improving 4/28 with diuresis Severe PAH (TTE 4/26 >> PASP ~ )  P:   -PEEP and FiO2 needs worse post op and 4/26, ? Some effect of increase intra-abd pressure post-closure but also B infiltrates superimposed on emphysema; suspect ALI due to bacteremia vs cardiogenic edema.  -continue empiric diuresis, negative 2.3L yesterday 4/27; lasix 60mg  another 2 doses today 4/28 -SBT when FiO2 and PEEP allow, has desaturated with WUA last 3 days -BD's scheduled and prn -f/u CXR  CARDIOVASCULAR A:  Ruptured AAA s/p repair 4/23. PAF >> back to sinus 4/23. Hemorrhagic shock >> resolved. Hx of HTN, PVD. PAH, likely secondary to underlying lung disease and to hypertensive heart dz, A Fib P:  - continue goal negative fluid balance given resp improvement w  diuresis; will continue as BP and renal fxn tolerate - PRN hydralazine, lopressor for HTN - further PAH w/u later once stabilized  RENAL A:   Hypokalemia, resolved  P:   - diuresis as above - f/u and replace electrolytes as needed - monitor renal fx, urine outpt  GASTROINTESTINAL A:   Severe protein calorie malnutrition. Hyperbilirubinemia noted 4/28, cholestatic pattern, ? Etiology meds P:   - TNA started 4/24 - recheck LFT 4/30  HEMATOLOGIC A:  Acute blood loss anemia from AAA rupture, and critical illness. Thrombocytopenia >> likely from critical illness and hx of ETOH. Improved slightly 4/29 P:  - f/u CBC - transfuse for Hb < 7 or active bleeding; received PRBC 4/26 per [facility]  INFECTIOUS A: Salmonella UTI and bacteremia P:   - continue zosyn - consider addition empiric vanco if spikes temp given wound vac in place - d/c'd prn tylenol order so we will note temp spike if it happens  ENDOCRINE A:  Hyperglycemia >> likely 2nd to acute stress. HbA1C 5.8 from 4/23. P:   - SSI   NEUROLOGIC A:  Sedation. Hx of ETOH. P:   - Continuous sedation protocol - Daily WUA - thiamine, folic acid; d/c on 4/28  CC time 40 minutes.  Levy Pupa, MD, PhD 09/28/2012, 10:27 AM [facility] [phone] or if no answer [phone]

## 2012-09-28 NOTE — Progress Notes (Addendum)
[facility] [place]  Post-op  Intra-abdominal Surgery note  Date of Surgery: 09/21/2012 - Open repair ruptured AAA  Surgeon(s): Sherren Kerns, MD   09/24/2012 4 Days Post-Op Procedure(s): Removal of Abdominal wound VAC, and Abdominal Closure  History of Present Illness  Charles [name] is a 67 y.o. male who is  up s/p repair ruptured AAA   Pt is stable, sedated but arousable on vent. Panda tube not postpyloric despite multiple attempts by Radiology and some appears coiled in the mouth. No BM per nursing.  Significant Diagnostic Studies: CBC Lab Results  Component Value Date   WBC 9.2 09/28/2012   HGB 10.7* 09/28/2012   HCT 31.0* 09/28/2012   MCV 87.3 09/28/2012   PLT 94* 09/28/2012    BMET    Component Value Date/Time   NA 138 09/28/2012 0430   K 3.8 09/28/2012 0430   CL 97 09/28/2012 0430   CO2 34* 09/28/2012 0430   GLUCOSE 129* 09/28/2012 0430   BUN 29* 09/28/2012 0430   CREATININE 0.77 09/28/2012 0430   CALCIUM 8.4 09/28/2012 0430   GFRNONAA >90 09/28/2012 0430   GFRAA >90 09/28/2012 0430    COAG Lab Results  Component Value Date   INR 1.11 09/28/2012   INR 1.23 09/23/2012   INR 1.32 09/22/2012   No results found for this basename: PTT    I/O last 3 completed shifts: In: 4397 [I.V.:952; NG/GT:165; IV Piggyback:700] Out: 6085 [Urine:6010; Emesis/NG output:75]    Physical Examination BP Readings from Last 3 Encounters:  09/28/12 128/67  09/28/12 128/67  09/28/12 128/67   Temp Readings from Last 3 Encounters:  09/28/12 99.2 F (37.3 C) Oral  09/28/12 99.2 F (37.3 C) Oral  09/28/12 99.2 F (37.3 C) Oral   SpO2 Readings from Last 3 Encounters:  09/28/12 98%  09/28/12 98%  09/28/12 98%   Pulse Readings from Last 3 Encounters:  09/28/12 59  09/28/12 59  09/28/12 59    General: sedate on vent Pulmonary: normal non-labored breathing , without Rales, rhonchi,  wheezing Cardiac: Heart rate : irregular ,  Abdomen:abdomen soft,  non-tender and min BS Abdominal wound:clean, dry, intact  Right groin wound with serous drainage over weekend Vascular Exam:BLE warm  Extremities without ischemic changes, no Gangrene, no cellulitis; no open wounds;   LOWER EXTREMITY PULSES           RIGHT                                      LEFT      DORSALIS PEDIS      ANTERIOR TIBIAL  monophasic by Doppler  palpable   Assessment/Plan: Charles [name] is a 67 y.o. male who is S/P AAA repair and Removal of Abdominal wound VAC, and Abdominal Closure Acute on chronic malnutrition - on TNA Difficulty placing panda over weekend Will begin reglan CXR withdiffuse interstitial and patchy alveolar opacities bilaterally. - unchanged On Lasix BID - BUN climbing, CR still WNL Repeat KUB Acute on chronic acute blood loss anemia - stable - 2UPC this weekend Thrombocytopenia improved  Drainage right groin - ? Incisional vac - keep wound clean    Marlowe Shores [phone] 09/28/2012 7:54 AM  POD 6 Adequate perfusion to feet with doppler signals, abdominal and groin incisions healing  Still with VDRF being managed by Critical Care Service.  Becomes agitated when weaning sedation  Will  involve ID for long term management of Salmonella blood culture.  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  Right groin drainage dry dressing PRN according to nursing not enough drainage to justify VAC  Severe malnutrition.  Panda still not post pyloric.  Will remove tube today and try to start again tomorrow under fluoro.Still with CT contrast in gut suggesting no significant peristalsis so I do not believe feeding stomach would be safe.   Fabienne Bruns, MD [facility] [place] Office: [phone] Pager: [phone]

## 2012-09-28 NOTE — Progress Notes (Signed)
ANTIBIOTIC CONSULT NOTE - Follow-up  Pharmacy Consult for Zosyn Indication: UTI, bacteremia  No Known Allergies  Patient Measurements: Height: 5\' 4"  (162.6 cm) Weight: 123 lb 7.3 oz (56 kg) IBW/kg (Calculated) : 59.2  Vital Signs: Temp: 99.2 F (37.3 C) (04/29 0754) Temp src: Oral (04/29 0754) BP: 156/90 mmHg (04/29 0830) Pulse Rate: 82 (04/29 0830) Intake/Output from previous day: 04/28 0701 - 04/29 0700 In: 3076 [I.V.:576; NG/GT:180; IV Piggyback:500; TPN:1820] Out: 4610 [Urine:4560; Emesis/NG output:50] Intake/Output from this shift: Total I/O In: 202 [I.V.:42; TPN:160] Out: -   Labs:  Recent Labs  09/26/12 0438 09/27/12 0315 09/28/12 0430  WBC 11.4* 9.0 9.2  HGB 11.5* 10.6* 10.7*  PLT 65* 72* 94*  CREATININE 0.69 0.69 0.77   Estimated Creatinine Clearance: 70 ml/min (by C-G formula based on Cr of 0.77).  Medical History: Past Medical History  Diagnosis Date  . Hypercholesteremia   . Collapsed lung     s/p fall, rib fracture   Assessment: 66yom POD#6 open AAA repair. He continues on zosyn for salmonella in his urine and blood. Renal function stable. Tm = 99.8, wbc 9.2  4/22 CTX x 1 dose 4/23 Ancef >>4/23 4/23 Zosyn>>  4/22 urine>>Salmonella  4/22 blood>>2/2 Salmonella (S-amp/CTX/Cipro/Bactrim)  4/28 trach asp. >>  Goal of Therapy:  Appropriate zosyn dosing  Plan:  1) Zosyn 3.375g IV q8 (4 hour infusion) 2) Follow renal function and abx de-escalation (consider narrow to Ampicillin?)  Bayard Hugger, PharmD, BCPS  Clinical Pharmacist  Pager: [phone]   09/28/2012,9:42 AM

## 2012-09-29 ENCOUNTER — Inpatient Hospital Stay (HOSPITAL_COMMUNITY): Payer: Medicare Other

## 2012-09-29 LAB — GLUCOSE, CAPILLARY
Glucose-Capillary: 112 mg/dL — ABNORMAL HIGH (ref 70–99)
Glucose-Capillary: 123 mg/dL — ABNORMAL HIGH (ref 70–99)
Glucose-Capillary: 127 mg/dL — ABNORMAL HIGH (ref 70–99)
Glucose-Capillary: 91 mg/dL (ref 70–99)

## 2012-09-29 LAB — T-HELPER CELLS (CD4) COUNT (NOT AT ARMC)
CD4 % Helper T Cell: 37 % (ref 33–55)
CD4 T Cell Abs: 420 uL (ref 400–2700)

## 2012-09-29 LAB — BLOOD GAS, ARTERIAL
Drawn by: 24513
O2 Saturation: 94.5 %
PEEP: 10 cmH2O
Patient temperature: 98.6
RATE: 12 resp/min
pO2, Arterial: 65.6 mmHg — ABNORMAL LOW (ref 80.0–100.0)

## 2012-09-29 LAB — CBC
MCV: 89.1 fL (ref 78.0–100.0)
Platelets: 102 10*3/uL — ABNORMAL LOW (ref 150–400)
RBC: 3.66 MIL/uL — ABNORMAL LOW (ref 4.22–5.81)
WBC: 10.8 10*3/uL — ABNORMAL HIGH (ref 4.0–10.5)

## 2012-09-29 LAB — COMPREHENSIVE METABOLIC PANEL
ALT: 52 U/L (ref 0–53)
AST: 99 U/L — ABNORMAL HIGH (ref 0–37)
Albumin: 1.8 g/dL — ABNORMAL LOW (ref 3.5–5.2)
Calcium: 8.6 mg/dL (ref 8.4–10.5)
GFR calc Af Amer: >90 mL/min (ref >90)
Sodium: 140 mEq/L (ref 135–145)
Total Protein: 6.3 g/dL (ref 6.0–8.3)

## 2012-09-29 LAB — HIV-1 RNA ULTRAQUANT REFLEX TO GENTYP+: HIV-1 RNA Quant, Log: <1.3 {Log} (ref <1.30)

## 2012-09-29 LAB — CULTURE, RESPIRATORY W GRAM STAIN: Gram Stain: NONE SEEN

## 2012-09-29 MED ORDER — FAT EMULSION 20 % IV EMUL
240.0000 mL | INTRAVENOUS | Status: AC
  Administered 2012-09-29: 240 mL via INTRAVENOUS
  Filled 2012-09-29: qty 250

## 2012-09-29 MED ORDER — POTASSIUM CHLORIDE 10 MEQ/50ML IV SOLN
10.0000 meq | INTRAVENOUS | Status: AC
  Administered 2012-09-29 (×2): 10 meq via INTRAVENOUS
  Filled 2012-09-29: qty 100

## 2012-09-29 MED ORDER — POTASSIUM CHLORIDE 10 MEQ/50ML IV SOLN
10.0000 meq | INTRAVENOUS | Status: AC
  Administered 2012-09-29 (×2): 10 meq via INTRAVENOUS

## 2012-09-29 MED ORDER — VITAL AF 1.2 CAL PO LIQD
1000.0000 mL | ORAL | Status: DC
  Administered 2012-09-29: 1000 mL
  Filled 2012-09-29 (×2): qty 1000

## 2012-09-29 MED ORDER — TRACE MINERALS CR-CU-F-FE-I-MN-MO-SE-ZN IV SOLN
INTRAVENOUS | Status: AC
  Administered 2012-09-29: 17:00:00 via INTRAVENOUS
  Filled 2012-09-29: qty 2000

## 2012-09-29 MED ORDER — FUROSEMIDE 10 MG/ML IJ SOLN
60.0000 mg | Freq: Two times a day (BID) | INTRAMUSCULAR | Status: AC
  Administered 2012-09-29 (×2): 60 mg via INTRAVENOUS
  Filled 2012-09-29 (×2): qty 6

## 2012-09-29 NOTE — Progress Notes (Signed)
[facility]    Subjective: No new complaints   Antibiotics:  Anti-infectives   Start     Dose/Rate Route Frequency Ordered Stop   09/28/12 1700  cefTRIAXone (ROCEPHIN) 2 g in dextrose 5 % 50 mL IVPB     2 g 100 mL/hr over 30 Minutes Intravenous Every 24 hours 09/28/12 1520     09/28/12 1600  metroNIDAZOLE (FLAGYL) tablet 500 mg     500 mg Oral 3 times per day 09/28/12 1520     09/22/12 1500  ceFAZolin (ANCEF) IVPB 1 g/50 mL premix  Status:  Discontinued     1 g 100 mL/hr over 30 Minutes Intravenous Every 8 hours 09/22/12 0842 09/22/12 0917   09/22/12 1000  piperacillin-tazobactam (ZOSYN) IVPB 3.375 g  Status:  Discontinued     3.375 g 12.5 mL/hr over 240 Minutes Intravenous 3 times per day 09/22/12 0932 09/28/12 1520   09/21/12 2345  [MAR Hold]  ceFAZolin (ANCEF) IVPB 2 g/50 mL premix  Status:  Discontinued     (On MAR Hold since 09/22/12 0004)  Comments:  Send with pt to OR   2 g 100 mL/hr over 30 Minutes Intravenous On call 09/21/12 2343 09/22/12 0842   09/21/12 1400  [MAR Hold]  cefTRIAXone (ROCEPHIN) 1 g in dextrose 5 % 50 mL IVPB  Status:  Discontinued     (On MAR Hold since 09/22/12 0004)   1 g 100 mL/hr over 30 Minutes Intravenous Every 24 hours 09/21/12 1257 09/22/12 0842      Medications: Scheduled Meds: . ipratropium  0.5 mg Nebulization Q6H   And  . albuterol  2.5 mg Nebulization Q6H  . antiseptic oral rinse  15 mL Mouth Rinse QID  . cefTRIAXone (ROCEPHIN)  IV  2 g Intravenous Q24H  . chlorhexidine  15 mL Mouth Rinse BID  . furosemide  60 mg Intravenous BID  . insulin aspart  0-15 Units Subcutaneous Q4H  . metoCLOPramide (REGLAN) injection  5 mg Intravenous Q6H  . metoprolol  5 mg Intravenous Q6H  . metroNIDAZOLE  500 mg Oral Q8H  . pantoprazole (PROTONIX) IV  40 mg Intravenous QHS  . sodium chloride  10-40 mL Intracatheter Q12H   Continuous Infusions: . sodium chloride 20 mL/hr at 09/25/12 2353  . Marland KitchenTPN (CLINIMIX-E) Adult 75  mL/hr at 09/29/12 1725   And  . fat emulsion 240 mL (09/29/12 1726)  . feeding supplement (VITAL AF 1.2 CAL) 1,000 mL (09/29/12 1600)  . fentaNYL infusion INTRAVENOUS 150 mcg/hr (09/29/12 0430)  . midazolam (VERSED) infusion 1 mg/hr (09/29/12 1000)   PRN Meds:.albuterol, fentaNYL, hydrALAZINE, labetalol, magnesium sulfate 1 - 4 g bolus IVPB, midazolam, ondansetron, sodium chloride   Objective: Weight change: -5 lb 8.2 oz (-2.5 kg)  Intake/Output Summary (Last 24 hours) at 09/29/12 1829 Last data filed at 09/29/12 1800  Gross per 24 hour  Intake 2672.57 ml  Output   5235 ml  Net -2562.43 ml   Blood pressure 151/76, pulse 73, temperature 101.4 F (38.6 C), temperature source Oral, resp. rate 17, height 5\' 4"  (1.626 m), weight 117 lb 15.1 oz (53.5 kg), SpO2 96.00%. Temp:  [98.4 F (36.9 C)-101.4 F (38.6 C)] 101.4 F (38.6 C) (04/30 1613) Pulse Rate:  [72-165] 73 (04/30 1800) Resp:  [10-25] 17 (04/30 1800) BP: (104-173)/(53-96) 151/76 mmHg (04/30 1800) SpO2:  [92 %-99 %] 96 % (04/30 1621) FiO2 (%):  [39.6 %-100 %] 40.1 % (04/30 1800) Weight:  [117 lb 15.1  oz (53.5 kg)] 117 lb 15.1 oz (53.5 kg) (04/30 0200)  Physical Exam: HEENT: anicteric sclera, EOMI, oropharynx clear and without exudate  CVS tachycardic no murmur rubs or gallops  Chest: clear to auscultation bilaterally, no wheezing, rales or rhonchi  Abdomen:large midline incision is clean  Extremities: no clubbing or edema noted bilaterally  Skin: no rashes  Neuro: nonfocal,  Neuro: nonfocal  Lab Results:  Recent Labs  09/28/12 0430 09/29/12 0340  WBC 9.2 10.8*  HGB 10.7* 11.2*  HCT 31.0* 32.6*  PLT 94* 102*    BMET  Recent Labs  09/28/12 0430 09/29/12 0340  NA 138 140  K 3.8 3.4*  CL 97 96  CO2 34* 40*  GLUCOSE 129* 105*  BUN 29* 30*  CREATININE 0.77 0.76  CALCIUM 8.4 8.6    Micro Results: Recent Results (from the past 240 hour(s))  URINE CULTURE     Status: None   Collection Time     09/21/12 10:45 AM      Result Value Range Status   Specimen Description URINE, CLEAN CATCH   Final   Special Requests NONE   Final   Culture  Setup Time 09/21/2012 22:18   Final   Colony Count >=100,000 COLONIES/ML   Final   Culture     Final   Value: SALMONELLA SPECIES     Note: CRITICAL RESULT CALLED TO, READ BACK BY AND VERIFIED WITH:  EMILY S ON 4.27.14 @ 1700 BY FERGK Referred to [facility] in [place], [place] for Serotyping.   Report Status PENDING   Incomplete   Organism ID, Bacteria SALMONELLA SPECIES   Final  CULTURE, BLOOD (ROUTINE X 2)     Status: None   Collection Time    09/21/12  1:46 PM      Result Value Range Status   Specimen Description BLOOD LEFT ARM   Final   Special Requests BOTTLES DRAWN AEROBIC ONLY 6CC   Final   Culture  Setup Time 09/22/2012 02:45   Final   Culture     Final   Value: SALMONELLA SPECIES     Note: CRITICAL RESULT CALLED TO, READ BACK BY AND VERIFIED WITH: HUYSUI KSOR @1300  09/24/12 BY KRAWS FAXEDTO [place] 09/24/12     Note: CRITICAL RESULT CALLED TO, READ BACK BY AND VERIFIED WITH: Orpah Clinton AT [facility] AT 0535 09/22/12 BY FORSYTH K Performed at [facility]   Report Status PENDING   Incomplete   Organism ID, Bacteria SALMONELLA SPECIES   Final  CULTURE, BLOOD (ROUTINE X 2)     Status: None   Collection Time    09/21/12  1:51 PM      Result Value Range Status   Specimen Description BLOOD RIGHT ARM   Final   Special Requests BOTTLES DRAWN AEROBIC AND ANAEROBIC [facility] EACH   Final   Culture  Setup Time 09/22/2012 16:06   Final   Culture     Final   Value: SALMONELLA SPECIES     Note: SUSCEPTIBILITIES PERFORMED ON PREVIOUS CULTURE WITHIN THE LAST 5 DAYS. CRITICAL RESULT CALLED TO, READ BACK BY AND VERIFIED WITH: HUYIU KSOR @ 1300 09/24/12 BY KRAWS     Note: CRITICAL RESULT CALLED TO, READ BACK BY AND VERIFIED WITH: Orpah Clinton AT [facility] AT 0535 09/22/12 BY FORSYTH K Performed at [facility]   Report  Status PENDING   Incomplete  MRSA PCR SCREENING     Status: None  Collection Time    09/21/12  3:30 PM      Result Value Range Status   MRSA by PCR NEGATIVE  NEGATIVE Final   Comment:            The GeneXpert MRSA Assay (FDA     approved for NASAL specimens     only), is one component of a     comprehensive MRSA colonization     surveillance program. It is not     intended to diagnose MRSA     infection nor to guide or     monitor treatment for     MRSA infections.  CULTURE, RESPIRATORY (NON-EXPECTORATED)     Status: None   Collection Time    09/27/12  4:00 AM      Result Value Range Status   Specimen Description TRACHEAL ASPIRATE   Final   Special Requests NONE   Final   Gram Stain     Final   Value: NO WBC SEEN     RARE SQUAMOUS EPITHELIAL CELLS PRESENT     RARE YEAST   Culture RARE CANDIDA ALBICANS   Final   Report Status 09/29/2012 FINAL   Final    Studies/Results: Dg Abd 1 View  09/28/2012  *RADIOLOGY REPORT*  Clinical Data: Feeding tube placement.  ABDOMEN - 1 VIEW  Comparison: Earlier today.  Findings: A single spot C-arm image of the abdomen demonstrates a feeding tube and injected contrast in the proximal small bowel. The feeding tube tip is at the ligament of Treitz.  IMPRESSION: Feeding tube tip at the ligament of Treitz, ready to use.   Original Report Authenticated By: Beckie Salts, M.D.    Dg Chest Port 1 View  09/29/2012  *RADIOLOGY REPORT*  Clinical Data: Airspace disease.  PORTABLE CHEST - 1 VIEW  Comparison: 09/28/2012.  Findings: Endotracheal tube tip 3.2 cm above the carina.  Right central line tip mid superior vena cava level.  Panda tube and nasogastric tube in place.  Tips not imaged.  Diffuse air space disease unchanged.  No gross pneumothorax.  Heart size within normal limits.  Slightly tortuous aorta.  IMPRESSION: No significant change.   Original Report Authenticated By: Lacy Duverney, M.D.    Dg Chest Port 1 View  09/28/2012  *RADIOLOGY REPORT*   Clinical Data: Evaluate endotracheal tubes and lines.  PORTABLE CHEST - 1 VIEW  Comparison: 09/27/2012.  Findings: Endotracheal tube tip 3.4 cm above the carina.  Right central line tip mid superior vena cava level.  Nasogastric tube and Panda tube are in place coursing below the diaphragm.  The tips are not included on the present exam.  No gross pneumothorax.  Diffuse air space disease unchanged.  Calcified aorta.  IMPRESSION: Endotracheal tube tip 3.4 cm above the carina.  Right central line tip mid superior vena cava level.  Nasogastric tube and Panda tube are in place coursing below the diaphragm.  The tips are not included on the present exam. The Panda tube  may be curled in the region of the hypopharynx as previously noted.  Diffuse air space disease unchanged.   Original Report Authenticated By: Lacy Duverney, M.D.    Dg Abd Portable 1v  09/28/2012  *RADIOLOGY REPORT*  Clinical Data: Evaluate for ileus.  Panda tube placement.  PORTABLE ABDOMEN - 1 VIEW  Comparison: 09/26/2012  Findings: Feeding tube terminates at the gastric antrum, possibly entering the duodenum.  Nasogastric tube terminates at the body of the stomach.  Midline laparotomy changes.  Contrast within normal caliber colon. No significant small bowel dilatation.  IMPRESSION: No change in position of feeding tube.  This is positioned within the gastric antrum or entering the duodenal bulb.   Original Report Authenticated By: Jeronimo Greaves, M.D.    Dgnaso/oro Gtube Thru Duo-repos- No Rad  09/28/2012  CLINICAL DATA: panda placement   DG NASO ORO GTUBE THRU DUO-REP  Fluoroscopy was utilized by the requesting physician. No radiographic  interpretation.        Assessment/Plan: Charles [name] is a 67 y.o. 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 Regardless, given the predilection of this organism for vascular tissue and its propensity for causing mycotic aneurysm , chronicity of sssx, failure to thrive, we have high concern for possibility that his ruptured aneurysm was being driven by Salmonella infection, and even if it were not there is HIGH risk of secondary infection of the newly placed graft regardless HIV negative   --continue  Ceftriaxone 2g daily  --followup repeat blood cultures  --he will require protracted therapy the first 6-8 weeks of which should be either with IV abx vs highly bioavailable to fluoroquinolone, followed by protracted oral abx for months if not years --   #2 ? VAP: infiltrates on cxr, resp cx only yeast  --continue  Flagyl added to  ceftriaxone to keep some anerobic coverage for lungs with his ceftriaxone  Dr Ninetta Lights is taking over the consult service at [facility] tomorrow.   LOS: 8 days   Acey Lav 09/29/2012, 6:29 PM

## 2012-09-29 NOTE — Progress Notes (Signed)
NUTRITION FOLLOW UP  DOCUMENTATION CODES Per approved criteria  -Severe malnutrition in the context of acute illness or injury -Underweight   Intervention:    Initiate trickle feeds with Vital AF 1.2 (elemental, peptide based formula) at 5 ml/hr   Recommend advancing by 10 ml every 12 hours to goal rate of 55 ml/hr to provide 1584 total kcals, 99 gm protein, 1071 ml of free water  Monitor Mg, Phos, K+ levels RD to follow for nutrition care plan  Nutrition Dx:   Inadequate oral intake related to inability to eat as evidenced by NPO status, ongoing  Goal:   TPN to meet >90% of estimated protein needs, maximize energy provision as able during national lipid backorder, met  Monitor:   TPN prescription, EN regimen & tolerance, respiratory status, weight, labs, I/O's  Assessment:   Patient s/p procedures 4/22:  AORTO LEFT COMMON ILIAC RIGHT COMMON FEMORAL BYPASS  AORTIC ENDARTERECTOMY  RIGHT FEMORAL ENDARTERECTOMY PROFUNDAPLASTY  PLACEMENT OF ABDOMINAL WOUND VAC  Patient remains intubated on ventilator support MV: 9.9 Temp: 37.7  S/p closure of abdomen 4/25.  Post-pyloric feeding tube placement attempted by Fluoroscopy 4/27 AM; left in stomach for hopeful migration ---> Reglan given.  Post-pyloric feeding tube placed successfully 4/29 PM ---> tip at the ligament of Treitz  RD received telephone order per Dr. Darrick Penna to start trickle feeds at 5 ml/hr.    Patient is receiving TPN with Clinimix E 5/15 @ 75 ml/hr.  Lipids (20% IVFE @ 10 ml/hr), multivitamins, and trace elements are provided 3 times weekly (MWF) due to national backorder.  Provides 1484 kcal and 90 grams protein daily (based on weekly average).  Meets 96% minimum estimated kcal and 100% minimum estimated protein needs.  Height: Ht Readings from Last 1 Encounters:  09/21/12 5\' 4"  (1.626 m)    Weight Status:   Wt Readings from Last 1 Encounters:  09/29/12 117 lb 15.1 oz (53.5 kg)    Re-estimated needs:  Kcal:  1550-1650 Protein: 85-100 gm Fluid: per MD  Skin: abdominal wound VAC  Diet Order: NPO   Intake/Output Summary (Last 24 hours) at 09/29/12 1140 Last data filed at 09/29/12 1100  Gross per 24 hour  Intake 2557.57 ml  Output   5265 ml  Net -2707.43 ml    Labs:   Recent Labs Lab 09/27/12 0315 09/28/12 0430 09/29/12 0340  NA 138 138 140  K 3.4* 3.8 3.4*  CL 99 97 96  CO2 33* 34* 40*  BUN 25* 29* 30*  CREATININE 0.69 0.77 0.76  CALCIUM 8.1* 8.4 8.6  MG 1.9 2.0 1.9  PHOS 4.4 4.4 4.7*  GLUCOSE 134* 129* 105*    CBG (last 3)   Recent Labs  09/29/12 0009 09/29/12 0436 09/29/12 0749  GLUCAP 127* 116* 112*    Scheduled Meds: . ipratropium  0.5 mg Nebulization Q6H   And  . albuterol  2.5 mg Nebulization Q6H  . antiseptic oral rinse  15 mL Mouth Rinse QID  . cefTRIAXone (ROCEPHIN)  IV  2 g Intravenous Q24H  . chlorhexidine  15 mL Mouth Rinse BID  . furosemide  60 mg Intravenous BID  . insulin aspart  0-15 Units Subcutaneous Q4H  . metoCLOPramide (REGLAN) injection  5 mg Intravenous Q6H  . metoprolol  5 mg Intravenous Q6H  . metroNIDAZOLE  500 mg Oral Q8H  . pantoprazole (PROTONIX) IV  40 mg Intravenous QHS  . sodium chloride  10-40 mL Intracatheter Q12H    Continuous Infusions: . sodium  chloride 20 mL/hr at 09/25/12 2353  . Marland KitchenTPN (CLINIMIX-E) Adult     And  . fat emulsion    . fentaNYL infusion INTRAVENOUS 150 mcg/hr (09/29/12 0430)  . midazolam (VERSED) infusion 1 mg/hr (09/29/12 1000)  . Marland KitchenTPN (CLINIMIX-E) Adult 75 mL/hr at 09/28/12 1720    Maureen Chatters, RD, LDN Pager #: [phone] After-Hours Pager #: [phone]

## 2012-09-29 NOTE — Progress Notes (Signed)
Hypokalemia   K replaced  

## 2012-09-29 NOTE — Progress Notes (Addendum)
[facility] [place]  Post-op  Intra-abdominal Surgery note  Date of Surgery: 09/21/2012 -  AAA repair POD #8  Surgeon(s): Sherren Kerns, MD 09/24/2012  5 Days Post-Op Procedure(s): Removal of Abdominal wound VAC, and Abdominal Closure  History of Present Illness  Charles [name] is a 67 y.o. male who is  up s/p AAA repair - 8 days. Pt is stable. Mildly combative reaching for tubes this am on vent. More alert. Successful Panda placement yesterday ready to use per Rad report. + BS, no BM yet  Significant Diagnostic Studies: CBC Lab Results  Component Value Date   WBC 10.8* 09/29/2012   HGB 11.2* 09/29/2012   HCT 32.6* 09/29/2012   MCV 89.1 09/29/2012   PLT 102* 09/29/2012    BMET    Component Value Date/Time   NA 140 09/29/2012 0340   K 3.4* 09/29/2012 0340   CL 96 09/29/2012 0340   CO2 40* 09/29/2012 0340   GLUCOSE 105* 09/29/2012 0340   BUN 30* 09/29/2012 0340   CREATININE 0.76 09/29/2012 0340   CALCIUM 8.6 09/29/2012 0340   GFRNONAA >90 09/29/2012 0340   GFRAA >90 09/29/2012 0340    COAG Lab Results  Component Value Date   INR 1.07 09/29/2012   INR 1.11 09/28/2012   INR 1.23 09/23/2012   No results found for this basename: PTT    I/O last 3 completed shifts: In: 3962.6 [I.V.:952.6; NG/GT:325; IV Piggyback:250] Out: 1610 [RUEAV:4098; Emesis/NG output:125]    Physical Examination BP Readings from Last 3 Encounters:  09/29/12 104/58  09/29/12 104/58  09/29/12 104/58   Temp Readings from Last 3 Encounters:  09/29/12 99.2 F (37.3 C) Oral  09/29/12 99.2 F (37.3 C) Oral  09/29/12 99.2 F (37.3 C) Oral   SpO2 Readings from Last 3 Encounters:  09/29/12 99%  09/29/12 99%  09/29/12 99%   Pulse Readings from Last 3 Encounters:  09/29/12 82  09/29/12 82  09/29/12 82       Pt agitated but more alert on Vent- weaning Card.- A.fib - RVR when agitated Pulm - non-labored breathing on vent Abd soft min BS- wound healing well Right groin  with no futher drainage     LE PULSES               RIGHT                           LEFT      DORSALIS PEDIS      ANTERIOR TIBIAL  monophasic by Doppler  palpable   Assessment/Plan: Charles [name] is a 67 y.o. male who is 8 days Post emergent ruptured AAA Repair A.Fib - on q 6 hr metoprolol -HR 80's when calm - 140's when agitated Resp Failure _ weaning on vent Acute on Malnutrition - Panda in place - ? TF, Cont TNA for now Min bowel function - some BS no BM Hypokalemia - replace Agitation - retraints as needed NGT with light yellow drainage - 250cc IN with 125cc OUT in 24 hours Continue foley for strict I/O - diuresis  ROCZNIAK,REGINA J [phone] 09/29/2012 7:55 AM  Following some simple commands Vent wean per CCM Will start some trophic tube feeds 5 mL/hr but will not titrate up until more evidence of gut function.  He still has oral contrast in his gut from a week ago. Slight leukocytosis continue antibiotics per ID. Contraction alkalosis and increasing BUN probably nearing dry  wt  Fabienne Bruns, MD [facility] [place] Office: [phone] Pager: [phone]

## 2012-09-29 NOTE — Progress Notes (Signed)
PULMONARY  / CRITICAL CARE MEDICINE  Name: Charles [name] MRN: [id number] DOB: Jun 15, 1945    ADMISSION DATE:  09/21/2012 CONSULTATION DATE:  09/22/12  REFERRING MD :  Darrick Penna PRIMARY SERVICE: Fields  CHIEF COMPLAINT:  Post AAA repair  BRIEF PATIENT DESCRIPTION:  67yo male smoker, ETOH tx from [place] 4/22 with ruptured AAA and Afib. Taken emergently to OR on arrival to [facility].  Pt remained vented post op with open abd and PCCM consulted for vent/medical mgmt.   SIGNIFICANT EVENTS:  4/23 OR -- AAA repair, Aorto left common iliac right common femoral bypass,aortic endarterectomy, right femoral endarterectomy profundaplasty, placement abdominal VAC 4/24 Off pressors, tolerating pressure support, TNA started  STUDIES: 4/23 CT abd/pelvis>>> rupturing AAA  LINES / TUBES: ETT 4/23>>> R IJ PA cath 4/23>>> 4/24 R radial artery 4/23>>> Rt IJ CVL 4.24 >>  CULTURES: blooc cultures x 2 4/23 >>> Salmonella  Urine 4/23>>> GNR >>> Salmonella  ANTIBIOTICS: Ancef 4/22>>>4/23 Zosyn 4/23>>> 4/29 Ceftriaxone 4/29 >>  Flagyl IV 4/29 >>   SUBJECTIVE/INTERVAL EVENTS: Note abx change as abobve Note HIV checked and negative Another 2.6L negative in 24h; Overall now only 891cc positive for admission  VITAL SIGNS: Temp:  [98.4 F (36.9 C)-100.9 F (38.3 C)] 99.2 F (37.3 C) (04/30 0751) Pulse Rate:  [72-140] 88 (04/30 0814) Resp:  [11-25] 14 (04/30 0814) BP: (94-173)/(58-96) 158/86 mmHg (04/30 0814) SpO2:  [95 %-99 %] 95 % (04/30 0814) Arterial Line BP: (160-179)/(79-84) 160/79 mmHg (04/29 1100) FiO2 (%):  [39.7 %-50.6 %] 40 % (04/30 0814) Weight:  [53.5 kg (117 lb 15.1 oz)] 53.5 kg (117 lb 15.1 oz) (04/30 0200) HEMODYNAMICS:   VENTILATOR SETTINGS: Vent Mode:  [-] PRVC FiO2 (%):  [39.7 %-50.6 %] 40 % Set Rate:  [12 bmp] 12 bmp Vt Set:  [600 mL] 600 mL PEEP:  [10 cmH20] 10 cmH20 Plateau Pressure:  [22 cmH20-26 cmH20] 26 cmH20 INTAKE / OUTPUT: Intake/Output     04/29 0701 -  04/30 0700 04/30 0701 - 05/01 0700   I.V. (mL/kg) 580.6 (10.9)    NG/GT 250    IV Piggyback 200    TPN 1475    Total Intake(mL/kg) 2505.6 (46.8)    Urine (mL/kg/hr) 5080 (4)    Emesis/NG output 125 (0.1)    Total Output 5205     Net -2699.4            PHYSICAL EXAMINATION: General: No distress Neuro: Sedated, awakens easily HEENT: ETT in place Cardiovascular: regular Lungs: prolonged exhalation, no wheeze Abdomen: distended, wound vac in place Ext: no edema  LABS:  Recent Labs Lab 09/24/12 1918 09/25/12 1644 09/25/12 2336 09/26/12 0315 09/28/12 0402 09/29/12 0443  PHART 7.421 7.384  --  7.460* 7.458* 7.512*  PCO2ART 33.9* 38.6  --  38.6 50.6* 44.4  PO2ART 52.0* 60.0*  --  64.5* 90.0 65.6*  HCO3 22.0 23.0  --  27.1* 35.6* 35.4*  TCO2 23 24 27  28.3 37 36.7  O2SAT 87.0 90.0  --  92.8 97.0 94.5    Recent Labs Lab 09/25/12 0500 09/25/12 2336 09/26/12 0438 09/27/12 0315 09/28/12 0430 09/29/12 0340  NA 139 139 138 138 138 140  K 3.5 3.5 3.6 3.4* 3.8 3.4*  CL 108 101 102 99 97 96  CO2 26  --  29 33* 34* 40*  GLUCOSE 131* 133* 135* 134* 129* 105*  BUN 18 17 20  25* 29* 30*  CREATININE 0.60 0.60 0.69 0.69 0.77 0.76  CALCIUM 7.7*  --  8.0* 8.1* 8.4 8.6  MG 2.1  --  1.8 1.9 2.0 1.9  PHOS 2.6  --  4.4 4.4 4.4 4.7*    Recent Labs Lab 09/27/12 0315 09/28/12 0430 09/29/12 0340  HGB 10.6* 10.7* 11.2*  HCT 30.1* 31.0* 32.6*  WBC 9.0 9.2 10.8*  PLT 72* 94* 102*    Recent Labs Lab 09/22/12 1200 09/23/12 0400 09/24/12 0320 09/27/12 0315 09/28/12 0430 09/29/12 0340  AST  --  93* 88* 83*  --  99*  ALT  --  35 34 32  --  52  ALKPHOS  --  39 40 62  --  91  BILITOT  --  0.7 0.5 3.5*  --  2.0*  PROT  --  4.2* 4.2* 5.2*  --  6.3  ALBUMIN  --  1.7* 1.5* 1.6*  --  1.8*  INR 1.32 1.23  --   --  1.11 1.07    Recent Labs Lab 09/28/12 1646 09/28/12 2022 09/29/12 0009 09/29/12 0436 09/29/12 0749  GLUCAP 124* 112* 127* 116* 112*    CXR:  09/29/2012   *RADIOLOGY REPORT*  Clinical Data: Airspace disease.  PORTABLE CHEST - 1 VIEW  Comparison: 09/28/2012.  Findings: Endotracheal tube tip 3.2 cm above the carina.  Right central line tip mid superior vena cava level.  Panda tube and nasogastric tube in place.  Tips not imaged.  Diffuse air space disease unchanged.  No gross pneumothorax.  Heart size within normal limits.  Slightly tortuous aorta.  IMPRESSION: No significant change.   Original Report Authenticated By: Lacy Duverney, M.D.     TTE 09/25/12 >>  Study Conclusions - Left ventricle: The cavity size was normal. Wall thickness was normal. Systolic function was moderately reduced. The estimated ejection fraction was in the range of 35% to 40%. Diffuse hypokinesis. - Aortic valve: Mild to moderate regurgitation. Valve area: 1.4cm^2(VTI). Valve area: 1.31cm^2 (Vmax). - Mitral valve: Calcified annulus. Mildly thickened leaflets . Moderate regurgitation. - Left atrium: The atrium was mildly dilated. - Right atrium: The atrium was mildly dilated. - Pulmonary arteries: Systolic pressure was severely increased. PA peak pressure: 71mm Hg (S).   ASSESSMENT / PLAN:  PULMONARY A:  Acute respiratory failure post AAA repair Hx of smoking and presumed COPD, emphysema on CXR. B alveolar infiltrates, cardiogenic edema vs ALI vs PNA >> improving 4/28 with diuresis Severe PAH (TTE 4/26 >> PASP ~ )  P:   -PEEP and FiO2 needs worse post op and 4/26, ? Some effect of increase intra-abd pressure post-closure but also B infiltrates superimposed on emphysema; suspect ALI due to bacteremia vs cardiogenic edema.  -continue empiric diuresis, negative 2.6L yesterday 4/29; overall he is now +890 for hospitalization; lasix 60mg  another 2 doses today 4/30 -SBT when FiO2 and PEEP allow -BD's scheduled and prn -f/u CXR  CARDIOVASCULAR A:  Ruptured AAA s/p repair 4/23. PAF >> back to sinus 4/23. Hemorrhagic shock >> resolved. Hx of HTN, PVD. PAH,  likely secondary to underlying lung disease and to hypertensive heart dz, A Fib P:  - continue goal negative fluid balance given resp improvement w diuresis; will continue as BP and renal fxn tolerate - PRN hydralazine, lopressor for HTN - further PAH w/u later once stabilized  RENAL A:   Hypokalemia P:   - diuresis as above - f/u and replace electrolytes as needed - monitor renal fx, urine outpt  GASTROINTESTINAL A:   Severe protein calorie malnutrition. Hyperbilirubinemia noted 4/28, cholestatic pattern, ? Etiology meds P:   -  TNA started 4/24 - follow LFT; will check abd [place] if they increase again  HEMATOLOGIC A:  Acute blood loss anemia from AAA rupture, and critical illness. Thrombocytopenia >> likely from critical illness and hx of ETOH. Improved slightly 4/30 P:  - f/u CBC - transfuse for Hb < 7 or active bleeding; received PRBC 4/26 per [facility]  INFECTIOUS A: Salmonella UTI and bacteremia; raises question of whether AAA was mycotic aneurism >> Dr Darrick Penna indicates that this was not the anatomical appearance at surgery HIV rapid 4/29 >> negative P:   - Zosyn changed to ceftriaxone 2g + flagyl on 4/29 per ID. Likely committed to long term abx given possibility of mycotic aneurism - d/c'd prn tylenol order so we will note temp spike if it happens  ENDOCRINE A:  Hyperglycemia >> likely 2nd to acute stress. HbA1C 5.8 from 4/23. P:   - SSI   NEUROLOGIC A:  Sedation. Hx of ETOH. P:   - Continuous sedation protocol - Daily WUA - received thiamine, folic acid; d/c on 4/28  CC time 40 minutes.  Levy Pupa, MD, PhD 09/29/2012, 9:09 AM [facility] [phone] or if no answer [phone]

## 2012-09-29 NOTE — Progress Notes (Signed)
PARENTERAL NUTRITION CONSULT NOTE - FOLLOW UP  Pharmacy Consult for TPN Indication: s/p AAA repair with open abdomen; 2 month hx of poor po intake with significant weight loss  No Known Allergies  Patient Measurements: Height: 5\' 4"  (162.6 cm) Weight: 117 lb 15.1 oz (53.5 kg) IBW/kg (Calculated) : 59.2  Vital Signs: Temp: 99.9 F (37.7 C) (04/30 0438) Temp src: Oral (04/30 0438) BP: 104/58 mmHg (04/30 0600) Pulse Rate: 82 (04/30 0600) Intake/Output from previous day: 04/29 0701 - 04/30 0700 In: 2505.6 [I.V.:580.6; NG/GT:250; IV Piggyback:200; TPN:1475] Out: 5205 [Urine:5080; Emesis/NG output:125] Intake/Output from this shift:    Labs:  Recent Labs  09/27/12 0315 09/28/12 0430 09/29/12 0340  WBC 9.0 9.2 10.8*  HGB 10.6* 10.7* 11.2*  HCT 30.1* 31.0* 32.6*  PLT 72* 94* 102*  INR  --  1.11 1.07     Recent Labs  09/27/12 0315 09/28/12 0430 09/29/12 0340  NA 138 138 140  K 3.4* 3.8 3.4*  CL 99 97 96  CO2 33* 34* 40*  GLUCOSE 134* 129* 105*  BUN 25* 29* 30*  CREATININE 0.69 0.77 0.76  CALCIUM 8.1* 8.4 8.6  MG 1.9 2.0 1.9  PHOS 4.4 4.4 4.7*  PROT 5.2*  --  6.3  ALBUMIN 1.6*  --  1.8*  AST 83*  --  99*  ALT 32  --  52  ALKPHOS 62  --  91  BILITOT 3.5*  --  2.0*  PREALBUMIN 2.9*  --   --   TRIG 77  --   --   CHOL 69  --   --    Estimated Creatinine Clearance: 66.9 ml/min (by C-G formula based on Cr of 0.76).    Recent Labs  09/28/12 2022 09/29/12 0009 09/29/12 0436  GLUCAP 112* 127* 116*    Insulin Requirements in the past 24 hours:  4 units Novolog SSI  Current Nutrition:  NPO Clinimix E 5/15 at 75cc/hr (Goal is 75 cc/hr to provide 90gm protein and avg 1483 Kcal with IV fats at 10cc/hr on MWF)  Nutritional Goals:  1350-1500 kCal, 80-90 grams of protein per day  Assessment: Pt presented to [facility] with 2 month hx of poor PO intake and weight loss due to mouth pain, abdominal pain, and low back pain. Down to 99# from usual weight of 120#. Found  to have ruptured AAA, AFib and transferred to [facility]. Now s/p AAA repair, pod#8.  GI: s/p abd closure 4/25. NGT output 160ml/24h. Panda in place, tip coiled in proximal stomach- no plans to feed until post-pyloric. Pt on reglan.  Endo: No hx DM. CBGs controlled on SSI alone, low needs. Current GIR is ~3 mcg/kg/min.  Lytes: Phos 4.7, Mag 1.9 K 3.4  K repleted per MD  Corr Ca 10.3. IVF =NS at  75ml/hr. Fluid requirement ~1.4-1.8 L/d.  Renal: No hx renal dz, Cr < 1 and uop 4.1 ml/kg/hr (lasix now dced), i/o neg 2.7L   Pulm: COPD, heavy smoker. FiO2 0.50, needs PAH w/u  Cards: AFib, controlled on Metoprolol. VSS.  Hepatobil: Hx EtOH abuse. AST 99, Alb low, Tbil 2.0. Trig and Chol nml. Prealbumin 2.9 - actually trend down from 4 days ago, expect to be low with ongoing inflammation, malnutrition and post-op status; will increase protein slightly to hopefully help with slow trend up.  Neuro: Fentanyl and Versed drips, RASS -1   ID: Antibiotics changed to Rocephin and flagyl, salmonella bacteremia (pcn sens).  Tm 99.2, WBC 10.8  Best Practices: SCDs, mouth care,  PPI IV  TPN Access: R-IJ CVC  TPN day#: 6  Plan:  - Cont Clinimix E 5/15 to 75 cc/hr (new goal rate) - Will plan to continue SSI/CBG checks for now. - Will supplement MVI, trace elements and IV fats on MWF due to ongoing national shortages. - Will f/u am labs. - Will f/u progress with Panda tube advancing/initiation of EN.  Thanks, Talbert Cage, PharmD Clinical pharmacist, pager [phone] 09/29/2012 7:45 AM

## 2012-09-29 NOTE — Progress Notes (Signed)
Pt alert, agitated but tolerating vent and following commands - squeeze hands, blink eyes, raise/lower arms, could not wiggle feet/toes - at 0410 with versed at 1 mg/he and fentanyl at 100 mcg/hr. Pt nodding yes to pain in neck and belly, nodding yes for pain medication, 50 mcg bolus given, at 0430 pt still answering yes to pain in neck, fentanyl rate increased to 150 mcg/hr. At 04:50 pt extremely agitated, pushing off side rails, waving hands up around head, HR in 140s,SBP up to 140, fighting with vent, but no longer answering yes to pain - pt given 2 mg bolus versed, rate increased to 2 mg/hr and scheduled 5mg  IV metoprolol given 1 hr early. 0:530 Pt no longer fighting with vent, calm, CPOT 0, HR 90-110, BP 111/61. Will continue with drips versed 76mh/hr, fentanyl 186mcg/hr and monitor.

## 2012-09-30 ENCOUNTER — Inpatient Hospital Stay (HOSPITAL_COMMUNITY): Payer: Medicare Other

## 2012-09-30 ENCOUNTER — Encounter (HOSPITAL_COMMUNITY): Payer: Self-pay | Admitting: Anesthesiology

## 2012-09-30 ENCOUNTER — Inpatient Hospital Stay (HOSPITAL_COMMUNITY): Payer: Medicare Other | Admitting: Anesthesiology

## 2012-09-30 ENCOUNTER — Encounter (HOSPITAL_COMMUNITY)
Admission: EM | Disposition: A | Discharge status: Other Acute Inpatient Hospital | Payer: Self-pay | Source: Home / Self Care | Attending: Vascular Surgery

## 2012-09-30 DIAGNOSIS — I70219 Atherosclerosis of native arteries of extremities with intermittent claudication, unspecified extremity: Secondary | ICD-10-CM

## 2012-09-30 HISTORY — PX: FEMORAL-POPLITEAL BYPASS GRAFT: SHX937

## 2012-09-30 LAB — GLUCOSE, CAPILLARY
Glucose-Capillary: 125 mg/dL — ABNORMAL HIGH (ref 70–99)
Glucose-Capillary: 135 mg/dL — ABNORMAL HIGH (ref 70–99)
Glucose-Capillary: 80 mg/dL (ref 70–99)
Glucose-Capillary: 94 mg/dL (ref 70–99)

## 2012-09-30 LAB — CBC
MCV: 89.5 fL (ref 78.0–100.0)
Platelets: 115 10*3/uL — ABNORMAL LOW (ref 150–400)
RBC: 3.7 MIL/uL — ABNORMAL LOW (ref 4.22–5.81)
RDW: 15 % (ref 11.5–15.5)
WBC: 11.7 10*3/uL — ABNORMAL HIGH (ref 4.0–10.5)

## 2012-09-30 LAB — PHOSPHORUS: Phosphorus: 4 mg/dL (ref 2.3–4.6)

## 2012-09-30 LAB — COMPREHENSIVE METABOLIC PANEL
ALT: 37 U/L (ref 0–53)
Calcium: 8.7 mg/dL (ref 8.4–10.5)
Creatinine, Ser: 0.7 mg/dL (ref 0.50–1.35)
GFR calc Af Amer: >90 mL/min (ref >90)
GFR calc non Af Amer: >90 mL/min (ref >90)
Glucose, Bld: 124 mg/dL — ABNORMAL HIGH (ref 70–99)
Sodium: 138 mEq/L (ref 135–145)
Total Protein: 6.3 g/dL (ref 6.0–8.3)

## 2012-09-30 LAB — MAGNESIUM: Magnesium: 1.9 mg/dL (ref 1.5–2.5)

## 2012-09-30 SURGERY — BYPASS GRAFT FEMORAL-POPLITEAL ARTERY
Anesthesia: General | Laterality: Right | Wound class: Clean

## 2012-09-30 MED ORDER — ONDANSETRON HCL 4 MG/2ML IJ SOLN: 4.0000 mg | Freq: Once | INTRAMUSCULAR | Status: AC | PRN

## 2012-09-30 MED ORDER — FUROSEMIDE 10 MG/ML IJ SOLN
INTRAMUSCULAR | Status: AC
  Filled 2012-09-30: qty 4

## 2012-09-30 MED ORDER — FUROSEMIDE 10 MG/ML IJ SOLN
60.0000 mg | Freq: Two times a day (BID) | INTRAMUSCULAR | Status: AC
  Administered 2012-09-30 (×2): 60 mg via INTRAVENOUS
  Filled 2012-09-30: qty 6
  Filled 2012-09-30: qty 2

## 2012-09-30 MED ORDER — HYDROMORPHONE HCL PF 1 MG/ML IJ SOLN: 0.2500 mg | INTRAMUSCULAR | Status: DC | PRN

## 2012-09-30 MED ORDER — POTASSIUM CHLORIDE 20 MEQ/15ML (10%) PO LIQD
ORAL | Status: AC
  Filled 2012-09-30: qty 30

## 2012-09-30 MED ORDER — THROMBIN 20000 UNITS EX SOLR
CUTANEOUS | Status: AC
  Filled 2012-09-30: qty 20000

## 2012-09-30 MED ORDER — MIDAZOLAM HCL 5 MG/5ML IJ SOLN
INTRAMUSCULAR | Status: DC | PRN
  Administered 2012-09-30 (×2): 2 mg via INTRAVENOUS

## 2012-09-30 MED ORDER — CEFAZOLIN SODIUM-DEXTROSE 2-3 GM-% IV SOLR
INTRAVENOUS | Status: DC | PRN
  Administered 2012-09-30: 2 g via INTRAVENOUS

## 2012-09-30 MED ORDER — LACTATED RINGERS IV SOLN
INTRAVENOUS | Status: DC | PRN
  Administered 2012-09-30: 12:00:00 via INTRAVENOUS

## 2012-09-30 MED ORDER — THROMBIN 20000 UNITS EX SOLR
CUTANEOUS | Status: DC | PRN
  Administered 2012-09-30: 12:00:00 via TOPICAL

## 2012-09-30 MED ORDER — SODIUM CHLORIDE 0.9 % IR SOLN
Status: DC | PRN
  Administered 2012-09-30: 12:00:00

## 2012-09-30 MED ORDER — POTASSIUM CHLORIDE 20 MEQ/15ML (10%) PO LIQD
40.0000 meq | Freq: Four times a day (QID) | ORAL | Status: AC
  Administered 2012-09-30 (×2): 40 meq via ORAL
  Filled 2012-09-30 (×2): qty 30

## 2012-09-30 MED ORDER — DEXTROSE 5 % IV SOLN
10.0000 mg | INTRAVENOUS | Status: DC | PRN
  Administered 2012-09-30: 15 ug/min via INTRAVENOUS

## 2012-09-30 MED ORDER — ENOXAPARIN SODIUM 40 MG/0.4ML ~~LOC~~ SOLN
40.0000 mg | SUBCUTANEOUS | Status: DC
  Administered 2012-09-30 – 2012-10-06 (×7): 40 mg via SUBCUTANEOUS
  Filled 2012-09-30 (×9): qty 0.4

## 2012-09-30 MED ORDER — IOHEXOL 300 MG/ML  SOLN
50.0000 mL | Freq: Once | INTRAMUSCULAR | Status: AC | PRN
  Administered 2012-09-30: 20 mL via INTRAVENOUS

## 2012-09-30 MED ORDER — CEFAZOLIN SODIUM 1-5 GM-% IV SOLN
INTRAVENOUS | Status: AC
  Filled 2012-09-30: qty 100

## 2012-09-30 MED ORDER — 0.9 % SODIUM CHLORIDE (POUR BTL) OPTIME
TOPICAL | Status: DC | PRN
  Administered 2012-09-30: 2000 mL

## 2012-09-30 MED ORDER — CLINIMIX E/DEXTROSE (5/15) 5 % IV SOLN
INTRAVENOUS | Status: AC
  Administered 2012-09-30: 18:00:00 via INTRAVENOUS
  Filled 2012-09-30: qty 2000

## 2012-09-30 MED ORDER — FENTANYL CITRATE 0.05 MG/ML IJ SOLN
INTRAMUSCULAR | Status: DC | PRN
  Administered 2012-09-30: 250 ug via INTRAVENOUS

## 2012-09-30 MED ORDER — PROTAMINE SULFATE 10 MG/ML IV SOLN
INTRAVENOUS | Status: DC | PRN
  Administered 2012-09-30 (×3): 10 mg via INTRAVENOUS

## 2012-09-30 MED ORDER — HEPARIN SODIUM (PORCINE) 1000 UNIT/ML IJ SOLN
INTRAMUSCULAR | Status: DC | PRN
  Administered 2012-09-30: 5000 [IU] via INTRAVENOUS

## 2012-09-30 MED ORDER — POTASSIUM CHLORIDE 10 MEQ/50ML IV SOLN: 10.0000 meq | INTRAVENOUS | Status: AC

## 2012-09-30 SURGICAL SUPPLY — 70 items
ADH SKN CLS APL DERMABOND .7 (GAUZE/BANDAGES/DRESSINGS) ×2
BANDAGE ELASTIC 4 VELCRO ST LF (GAUZE/BANDAGES/DRESSINGS) IMPLANT
BANDAGE ESMARK 6X9 LF (GAUZE/BANDAGES/DRESSINGS) IMPLANT
BNDG CMPR 9X6 STRL LF SNTH (GAUZE/BANDAGES/DRESSINGS)
BNDG ESMARK 6X9 LF (GAUZE/BANDAGES/DRESSINGS)
CANISTER SUCTION 2500CC (MISCELLANEOUS) ×2 IMPLANT
CLIP TI MEDIUM 24 (CLIP) ×2 IMPLANT
CLIP TI WIDE RED SMALL 24 (CLIP) ×2 IMPLANT
CLOTH BEACON ORANGE TIMEOUT ST (SAFETY) ×2 IMPLANT
COVER PROBE W GEL 5X96 (DRAPES) ×1 IMPLANT
COVER SURGICAL LIGHT HANDLE (MISCELLANEOUS) ×2 IMPLANT
CUFF TOURNIQUET SINGLE 24IN (TOURNIQUET CUFF) IMPLANT
CUFF TOURNIQUET SINGLE 34IN LL (TOURNIQUET CUFF) IMPLANT
CUFF TOURNIQUET SINGLE 44IN (TOURNIQUET CUFF) IMPLANT
DERMABOND ADVANCED (GAUZE/BANDAGES/DRESSINGS) ×2
DERMABOND ADVANCED .7 DNX12 (GAUZE/BANDAGES/DRESSINGS) ×2 IMPLANT
DRAIN CHANNEL 15F RND FF W/TCR (WOUND CARE) IMPLANT
DRAPE C-ARM 42X72 X-RAY (DRAPES) ×1 IMPLANT
DRAPE WARM FLUID 44X44 (DRAPE) ×2 IMPLANT
DRSG COVADERM 4X10 (GAUZE/BANDAGES/DRESSINGS) IMPLANT
DRSG COVADERM 4X8 (GAUZE/BANDAGES/DRESSINGS) IMPLANT
ELECT CAUTERY BLADE 6.4 (BLADE) ×2 IMPLANT
ELECT REM PT RETURN 9FT ADLT (ELECTROSURGICAL) ×4
ELECTRODE REM PT RTRN 9FT ADLT (ELECTROSURGICAL) ×1 IMPLANT
EVACUATOR SILICONE 100CC (DRAIN) IMPLANT
GLOVE BIO SURGEON STRL SZ 6.5 (GLOVE) ×2 IMPLANT
GLOVE BIO SURGEON STRL SZ7 (GLOVE) ×2 IMPLANT
GLOVE BIOGEL PI IND STRL 6.5 (GLOVE) IMPLANT
GLOVE BIOGEL PI IND STRL 7.5 (GLOVE) ×1 IMPLANT
GLOVE BIOGEL PI INDICATOR 6.5 (GLOVE) ×2
GLOVE BIOGEL PI INDICATOR 7.5 (GLOVE) ×1
GLOVE SURG SS PI 7.0 STRL IVOR (GLOVE) ×1 IMPLANT
GOWN BRE IMP SLV AUR XL STRL (GOWN DISPOSABLE) ×1 IMPLANT
GOWN STRL NON-REIN LRG LVL3 (GOWN DISPOSABLE) ×6 IMPLANT
GOWN STRL REIN 2XL XLG LVL4 (GOWN DISPOSABLE) ×1 IMPLANT
GRAFT PROPATEN W/RING 6X80X60 (Vascular Products) ×1 IMPLANT
INSERT FOGARTY SM (MISCELLANEOUS) ×1 IMPLANT
KIT BASIN OR (CUSTOM PROCEDURE TRAY) ×2 IMPLANT
KIT ROOM TURNOVER OR (KITS) ×2 IMPLANT
MARKER GRAFT CORONARY BYPASS (MISCELLANEOUS) IMPLANT
NS IRRIG 1000ML POUR BTL (IV SOLUTION) ×4 IMPLANT
PACK PERIPHERAL VASCULAR (CUSTOM PROCEDURE TRAY) ×2 IMPLANT
PAD ARMBOARD 7.5X6 YLW CONV (MISCELLANEOUS) ×4 IMPLANT
PADDING CAST COTTON 6X4 STRL (CAST SUPPLIES) IMPLANT
PENCIL BUTTON HOLSTER BLD 10FT (ELECTRODE) ×1 IMPLANT
SPONGE SURGIFOAM ABS GEL 100 (HEMOSTASIS) ×1 IMPLANT
STAPLER VISISTAT 35W (STAPLE) ×1 IMPLANT
STOPCOCK 4 WAY LG BORE MALE ST (IV SETS) IMPLANT
SUT ETHILON 3 0 PS 1 (SUTURE) IMPLANT
SUT GORETEX 5 0 TT13 24 (SUTURE) IMPLANT
SUT GORETEX 6.0 TT13 (SUTURE) IMPLANT
SUT MNCRL AB 4-0 PS2 18 (SUTURE) ×4 IMPLANT
SUT PROLENE 5 0 C 1 24 (SUTURE) ×2 IMPLANT
SUT PROLENE 6 0 BV (SUTURE) ×4 IMPLANT
SUT PROLENE 7 0 BV 1 (SUTURE) IMPLANT
SUT SILK 2 0 FS (SUTURE) ×2 IMPLANT
SUT SILK 3 0 (SUTURE)
SUT SILK 3-0 18XBRD TIE 12 (SUTURE) IMPLANT
SUT VIC AB 2-0 CT1 27 (SUTURE) ×4
SUT VIC AB 2-0 CT1 TAPERPNT 27 (SUTURE) ×2 IMPLANT
SUT VIC AB 3-0 SH 27 (SUTURE) ×4
SUT VIC AB 3-0 SH 27X BRD (SUTURE) ×3 IMPLANT
SUT VICRYL 4-0 PS2 18IN ABS (SUTURE) ×1 IMPLANT
TOWEL OR 17X24 6PK STRL BLUE (TOWEL DISPOSABLE) ×4 IMPLANT
TOWEL OR 17X26 10 PK STRL BLUE (TOWEL DISPOSABLE) ×4 IMPLANT
TRAY FOLEY CATH 14FRSI W/METER (CATHETERS) ×1 IMPLANT
TUBING EXTENTION W/L.L. (IV SETS) ×1 IMPLANT
UNDERPAD 30X30 INCONTINENT (UNDERPADS AND DIAPERS) ×4 IMPLANT
WATER STERILE IRR 1000ML POUR (IV SOLUTION) ×2 IMPLANT
WIRE MICRO SET SILHO 5FR 7 (SHEATH) ×1 IMPLANT

## 2012-09-30 NOTE — Progress Notes (Signed)
INFECTIOUS DISEASE PROGRESS NOTE  ID: [name] is a 67 y.o. male with   Principal Problem:   AAA (abdominal aortic aneurysm, ruptured) Active Problems:   Hyponatremia   Adult failure to thrive   Abdominal pain   Dehydration   Generalized weakness   New onset atrial fibrillation   Alcohol abuse   Acute respiratory failure   Bacteremia   Salmonella bacteremia  Subjective: Awakens to palpation of abdomen  Abtx:  Anti-infectives   Start     Dose/Rate Route Frequency Ordered Stop   09/28/12 1700  cefTRIAXone (ROCEPHIN) 2 g in dextrose 5 % 50 mL IVPB     2 g 100 mL/hr over 30 Minutes Intravenous Every 24 hours 09/28/12 1520     09/28/12 1600  metroNIDAZOLE (FLAGYL) tablet 500 mg     500 mg Oral 3 times per day 09/28/12 1520     09/22/12 1500  ceFAZolin (ANCEF) IVPB 1 g/50 mL premix  Status:  Discontinued     1 g 100 mL/hr over 30 Minutes Intravenous Every 8 hours 09/22/12 0842 09/22/12 0917   09/22/12 1000  piperacillin-tazobactam (ZOSYN) IVPB 3.375 g  Status:  Discontinued     3.375 g 12.5 mL/hr over 240 Minutes Intravenous 3 times per day 09/22/12 0932 09/28/12 1520   09/21/12 2345  [MAR Hold]  ceFAZolin (ANCEF) IVPB 2 g/50 mL premix  Status:  Discontinued     (On MAR Hold since 09/22/12 0004)  Comments:  Send with pt to OR   2 g 100 mL/hr over 30 Minutes Intravenous On call 09/21/12 2343 09/22/12 0842   09/21/12 1400  [MAR Hold]  cefTRIAXone (ROCEPHIN) 1 g in dextrose 5 % 50 mL IVPB  Status:  Discontinued     (On MAR Hold since 09/22/12 0004)   1 g 100 mL/hr over 30 Minutes Intravenous Every 24 hours 09/21/12 1257 09/22/12 0842      Medications:  Scheduled: . ipratropium  0.5 mg Nebulization Q6H   And  . albuterol  2.5 mg Nebulization Q6H  . antiseptic oral rinse  15 mL Mouth Rinse QID  . cefTRIAXone (ROCEPHIN)  IV  2 g Intravenous Q24H  . chlorhexidine  15 mL Mouth Rinse BID  . furosemide      . furosemide  60 mg Intravenous BID  . insulin aspart  0-15  Units Subcutaneous Q4H  . metoCLOPramide (REGLAN) injection  5 mg Intravenous Q6H  . metoprolol  5 mg Intravenous Q6H  . metroNIDAZOLE  500 mg Oral Q8H  . pantoprazole (PROTONIX) IV  40 mg Intravenous QHS  . potassium chloride  40 mEq Oral Q6H  . potassium chloride      . sodium chloride  10-40 mL Intracatheter Q12H    Objective: Vital signs in last 24 hours: Temp:  [98.3 F (36.8 C)-101.4 F (38.6 C)] 100 F (37.8 C) (05/01 0725) Pulse Rate:  [73-117] 117 (05/01 1002) Resp:  [9-23] 20 (05/01 1002) BP: (96-163)/(51-111) 130/111 mmHg (05/01 1002) SpO2:  [92 %-100 %] 98 % (05/01 1002) FiO2 (%):  [39.5 %-40.8 %] 40 % (05/01 1002)   Neck: R IJ clean Resp: rhonchi bilaterally and mild Cardio: tachy GI: abnormal findings:  hypoactive bowel sounds and tenderness with palpation, wound clean. Extremities: edema none  Lab Results  Recent Labs  09/29/12 0340 09/30/12 0345  WBC 10.8* 11.7*  HGB 11.2* 11.2*  HCT 32.6* 33.1*  NA 140 138  K 3.4* 3.3*  CL 96 95*  CO2 40*  38*  BUN 30* 30*  CREATININE 0.76 0.70   Liver Panel  Recent Labs  09/29/12 0340 09/30/12 0345  PROT 6.3 6.3  ALBUMIN 1.8* 1.8*  AST 99* 62*  ALT 52 37  ALKPHOS 91 77  BILITOT 2.0* 1.1   Sedimentation Rate No results found for this basename: ESRSEDRATE,  in the last 72 hours C-Reactive Protein No results found for this basename: CRP,  in the last 72 hours  Microbiology: Recent Results (from the past 240 hour(s))  URINE CULTURE     Status: None   Collection Time    09/21/12 10:45 AM      Result Value Range Status   Specimen Description URINE, CLEAN CATCH   Final   Special Requests NONE   Final   Culture  Setup Time 09/21/2012 22:18   Final   Colony Count >=100,000 COLONIES/ML   Final   Culture     Final   Value: SALMONELLA SPECIES     Note: CRITICAL RESULT CALLED TO, READ BACK BY AND VERIFIED WITH:  EMILY S ON 4.27.14 @ 1700 BY FERGK Referred to [facility] in [place],  [place] for Serotyping.   Report Status PENDING   Incomplete   Organism ID, Bacteria SALMONELLA SPECIES   Final  CULTURE, BLOOD (ROUTINE X 2)     Status: None   Collection Time    09/21/12  1:46 PM      Result Value Range Status   Specimen Description BLOOD LEFT ARM   Final   Special Requests BOTTLES DRAWN AEROBIC ONLY 6CC   Final   Culture  Setup Time 09/22/2012 02:45   Final   Culture     Final   Value: SALMONELLA SPECIES     Note: CRITICAL RESULT CALLED TO, READ BACK BY AND VERIFIED WITH: HUYSUI KSOR @1300  09/24/12 BY KRAWS FAXEDTO [place] [facility] 09/24/12     Note: CRITICAL RESULT CALLED TO, READ BACK BY AND VERIFIED WITH: Orpah Clinton AT [facility] AT 0535 09/22/12 BY FORSYTH K Performed at [facility]   Report Status PENDING   Incomplete   Organism ID, Bacteria SALMONELLA SPECIES   Final  CULTURE, BLOOD (ROUTINE X 2)     Status: None   Collection Time    09/21/12  1:51 PM      Result Value Range Status   Specimen Description BLOOD RIGHT ARM   Final   Special Requests BOTTLES DRAWN AEROBIC AND ANAEROBIC [facility] EACH   Final   Culture  Setup Time 09/22/2012 16:06   Final   Culture     Final   Value: SALMONELLA SPECIES     Note: SUSCEPTIBILITIES PERFORMED ON PREVIOUS CULTURE WITHIN THE LAST 5 DAYS. CRITICAL RESULT CALLED TO, READ BACK BY AND VERIFIED WITH: HUYIU KSOR @ 1300 09/24/12 BY KRAWS     Note: CRITICAL RESULT CALLED TO, READ BACK BY AND VERIFIED WITH: Orpah Clinton AT [facility] AT 0535 09/22/12 BY FORSYTH K Performed at [facility]   Report Status PENDING   Incomplete  MRSA PCR SCREENING     Status: None   Collection Time    09/21/12  3:30 PM      Result Value Range Status   MRSA by PCR NEGATIVE  NEGATIVE Final   Comment:            The GeneXpert MRSA Assay (FDA     approved for NASAL specimens     only), is one component of a  comprehensive MRSA colonization     surveillance program. It is not     intended to diagnose MRSA     infection nor to guide or      monitor treatment for     MRSA infections.  CULTURE, RESPIRATORY (NON-EXPECTORATED)     Status: None   Collection Time    09/27/12  4:00 AM      Result Value Range Status   Specimen Description TRACHEAL ASPIRATE   Final   Special Requests NONE   Final   Gram Stain     Final   Value: NO WBC SEEN     RARE SQUAMOUS EPITHELIAL CELLS PRESENT     RARE YEAST   Culture RARE CANDIDA ALBICANS   Final   Report Status 09/29/2012 FINAL   Final  CULTURE, BLOOD (ROUTINE X 2)     Status: None   Collection Time    09/28/12  6:20 PM      Result Value Range Status   Specimen Description BLOOD RIGHT ARM   Final   Special Requests BOTTLES DRAWN AEROBIC AND ANAEROBIC 10CC   Final   Culture  Setup Time 09/29/2012 03:19   Final   Culture     Final   Value:        BLOOD CULTURE RECEIVED NO GROWTH TO DATE CULTURE WILL BE HELD FOR 5 DAYS BEFORE ISSUING A FINAL NEGATIVE REPORT   Report Status PENDING   Incomplete  CULTURE, BLOOD (ROUTINE X 2)     Status: None   Collection Time    09/28/12  6:35 PM      Result Value Range Status   Specimen Description BLOOD RIGHT HAND   Final   Special Requests BOTTLES DRAWN AEROBIC ONLY 3CC   Final   Culture  Setup Time 09/29/2012 03:19   Final   Culture     Final   Value:        BLOOD CULTURE RECEIVED NO GROWTH TO DATE CULTURE WILL BE HELD FOR 5 DAYS BEFORE ISSUING A FINAL NEGATIVE REPORT   Report Status PENDING   Incomplete    Studies/Results: Dg Abd 1 View  09/28/2012  *RADIOLOGY REPORT*  Clinical Data: Feeding tube placement.  ABDOMEN - 1 VIEW  Comparison: Earlier today.  Findings: A single spot C-arm image of the abdomen demonstrates a feeding tube and injected contrast in the proximal small bowel. The feeding tube tip is at the ligament of Treitz.  IMPRESSION: Feeding tube tip at the ligament of Treitz, ready to use.   Original Report Authenticated By: Beckie Salts, M.D.    Dg Chest Port 1 View  09/30/2012  *RADIOLOGY REPORT*  Clinical Data: Evaluate endotracheal  tubes, lines, shortness of breath  PORTABLE CHEST - 1 VIEW  Comparison: 09/25/2012; 09/22/2012; 10/24/2004  Findings:  Grossly unchanged cardiac silhouette and mediastinal contours. Stable position of support apparatus.  No pneumothorax.  Grossly unchanged diffuse slightly coarsened reticular opacities with basilar predominance.  No definite pleural effusion.  Unchanged bones.  Linear radiopacity is again overlie the right supraclavicular fossa and are favored to be external to the patient (possibly hair).  IMPRESSION: 1.  Stable positioning of support apparatus.  No pneumothorax. 2.  Grossly unchanged rather diffuse, basilar predominant coarse reticular opacities with differential considerations including pulmonary edema and/or multifocal infection.   Original Report Authenticated By: Tacey Ruiz, MD    Dg Chest Port 1 View  09/29/2012  *RADIOLOGY REPORT*  Clinical Data: Airspace disease.  PORTABLE CHEST -  1 VIEW  Comparison: 09/28/2012.  Findings: Endotracheal tube tip 3.2 cm above the carina.  Right central line tip mid superior vena cava level.  Panda tube and nasogastric tube in place.  Tips not imaged.  Diffuse air space disease unchanged.  No gross pneumothorax.  Heart size within normal limits.  Slightly tortuous aorta.  IMPRESSION: No significant change.   Original Report Authenticated By: Lacy Duverney, M.D.    Dg Abd Portable 1v  09/30/2012  *RADIOLOGY REPORT*  Clinical Data: Feeding tube placement.  PORTABLE ABDOMEN - 1 VIEW  Comparison: 09/28/2012  Findings: Feeding tube tip is now in the proximal jejunum but there is a sharp kink in the feeding tube at the ligament of Treitz.  The tube could be retracted 6 cm in this probably eliminate the kink.  NG tube is in place in good position.  Contrast is seen throughout the nondistended colon.  IMPRESSION: Feeding tube is in the proximal jejunum with a kink at the ligament of Treitz.  I recommend retracting approximately 6 cm to see if this eliminates the  kink.  I suspect there is a another kink approximately 3 cm distally.   Original Report Authenticated By: Francene Boyers, M.D.    Dgnaso/oro Gtube Thru Duo-repos- No Rad  09/28/2012  CLINICAL DATA: panda placement   DG NASO ORO GTUBE THRU DUO-REP  Fluoroscopy was utilized by the requesting physician. No radiographic  interpretation.       Assessment/Plan: Ruptured AAA (repaired 09-24-13) Salmonella bacteremia (pan-sens)  Repeat BCx 4-29 NGTD Would plan for 6 weeks of IV ceftriaxone and then f/u in ID clinic for consideration of po flouroquinolone.   Total days of antibiotics: 9 (ceftriaxone)  Johny Sax Infectious Diseases [phone] 09/30/2012, 10:41 AM   LOS: 9 days

## 2012-09-30 NOTE — Progress Notes (Addendum)
Patient ID: Charles [name], male   DOB: 06/06/1945, 67 y.o.   MRN: [id number] Call to see patient regarding absent pulses right lower extremity. Patient has had faint pulses in right leg but during the night it was noted that no longer could flow be heard in the right DP or PT. Continues to have flow in left foot. On examination patient remains very poorly responsive on the ventilator. He does not follow commands. He is in chronic A. fib with rate in the 80s. Blood pressure 120/70 3+ pulse right femoral artery (graft) audible flow right popliteal which is sluggish and monophasic. No flow right DP or PT. Right foot cooler than left. 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 Darrick Penna. Required reopening of right femoral artery postoperatively with patch angioplasty of profunda an endarterectomy. Patient not felt to be candidate for femoral-popliteal bypass graft at that time.  Due to patient's multiple problems including neurologic status, and difficulty in weaning from ventilator, and multiple operative procedures-not sure that further exploration of right femoral artery is in his best interest. I do not think he is candidate for femoral-popliteal bypass graft.  I called 2 daughters to discuss this with them-Charles [name] and Charles [name] and they requested a meeting with Dr. Darrick Penna this morning to help make decision regarding plans. I explained to them that exploration of femoral artery alone is unlikely to lead to limb salvage although possible. Also discussed the fact that lower extremity bypass surgery i.e. femoropopliteal at this time may not be in his best interest because of his multiple procedures and also may not lead to limb salvage.  Family will meet with Dr. Darrick Penna at 7:30 AM to determine plan and we will then proceed as indicated.

## 2012-09-30 NOTE — Progress Notes (Signed)
Pt received back from Or and placed on vent on precious full support settings.

## 2012-09-30 NOTE — Preoperative (Signed)
Beta Blockers   Reason not to administer Beta Blockers:Not Applicable 

## 2012-09-30 NOTE — Progress Notes (Signed)
RT bagged pt to OR with OR staff.

## 2012-09-30 NOTE — Progress Notes (Signed)
Wasted 25mL Versed in sink. Witnessed by [employer]

## 2012-09-30 NOTE — Anesthesia Preprocedure Evaluation (Signed)
Anesthesia Evaluation  Patient identified by MRN, date of birth, ID band Patient unresponsive    Reviewed: Allergy & Precautions, H&P , NPO status , Patient's Chart, lab work & pertinent test results, Unable to perform ROS - Chart review only  Airway       Dental   Pulmonary          Cardiovascular + Peripheral Vascular Disease + dysrhythmias Atrial Fibrillation     Neuro/Psych    GI/Hepatic   Endo/Other    Renal/GU      Musculoskeletal   Abdominal   Peds  Hematology   Anesthesia Other Findings   Reproductive/Obstetrics                           Anesthesia Physical Anesthesia Plan  ASA: IV  Anesthesia Plan: General   Post-op Pain Management:    Induction: Intravenous  Airway Management Planned:   Additional Equipment:   Intra-op Plan:   Post-operative Plan: Post-operative intubation/ventilation  Informed Consent:   Plan Discussed with: CRNA, Anesthesiologist and Surgeon  Anesthesia Plan Comments:         Anesthesia Quick Evaluation

## 2012-09-30 NOTE — Progress Notes (Signed)
PULMONARY  / CRITICAL CARE MEDICINE  Name: Charles [name] MRN: [id number] DOB: [birth year]    ADMISSION DATE:  09/21/2012 CONSULTATION DATE:  09/22/12  REFERRING MD :  Darrick Penna PRIMARY SERVICE: Fields  CHIEF COMPLAINT:  Post AAA repair  BRIEF PATIENT DESCRIPTION:  67yo male smoker, ETOH tx from [facility] 4/22 with ruptured AAA and Afib. Taken emergently to OR on arrival to [facility].  Pt remained vented post op with open abd and PCCM consulted for vent/medical mgmt.   SIGNIFICANT EVENTS:  4/23 OR -- AAA repair, Aorto left common iliac right common femoral bypass,aortic endarterectomy, right femoral endarterectomy profundaplasty, placement abdominal VAC 4/24 Off pressors, tolerating pressure support, TNA started  STUDIES: 4/23 CT abd/pelvis>>> rupturing AAA  LINES / TUBES: ETT 4/23>>> R IJ PA cath 4/23>>> 4/24 R radial artery 4/23>>> 5/30 Rt IJ CVL 4.24 >>  CULTURES: blooc cultures x 2 4/23 >>> Salmonella  Urine 4/23>>> GNR >>> Salmonella  ANTIBIOTICS: Ancef 4/22>>>4/23 Zosyn 4/23>>> 4/29 Ceftriaxone 4/29 >>  Flagyl IV 4/29 >>   SUBJECTIVE/INTERVAL EVENTS: Note Dr Candie Chroman eval overnight, loss of pulses in R foot (pt w prior intervention on R femoral). Unclear whether he can benefit from any intervention here.  Another 1.5L negative in 24h; Overall now -621cc for hospitalization Tolerating PSV 5/1  VITAL SIGNS: Temp:  [98.3 F (36.8 C)-101.4 F (38.6 C)] 100 F (37.8 C) (05/01 0725) Pulse Rate:  [73-165] 82 (05/01 0740) Resp:  [9-23] 14 (05/01 0740) BP: (96-163)/(51-88) 96/58 mmHg (05/01 0740) SpO2:  [92 %-100 %] 97 % (05/01 0740) FiO2 (%):  [39.5 %-100 %] 40 % (05/01 0740) HEMODYNAMICS:   VENTILATOR SETTINGS: Vent Mode:  [-] PSV;CPAP FiO2 (%):  [39.5 %-100 %] 40 % Set Rate:  [10 bmp-12 bmp] 12 bmp Vt Set:  [550 mL-600 mL] 550 mL PEEP:  [5 cmH20-10 cmH20] 5 cmH20 Pressure Support:  [8 cmH20-10 cmH20] 8 cmH20 Plateau Pressure:  [14 cmH20-26 cmH20] 15  cmH20 INTAKE / OUTPUT: Intake/Output     04/30 0701 - 05/01 0700 05/01 0701 - 05/02 0700   I.V. (mL/kg) 837 (15.6)    NG/GT 270    IV Piggyback 150    TPN 1950    Total Intake(mL/kg) 3207 (59.9)    Urine (mL/kg/hr) 4570 (3.6)    Emesis/NG output 150 (0.1)    Total Output 4720     Net -1513          Urine Occurrence 1 x    Stool Occurrence 1 x      PHYSICAL EXAMINATION: General: No distress Neuro: Sedated, awakens easily HEENT: ETT in place Cardiovascular: regular Lungs: prolonged exhalation, no wheeze Abdomen: distended, wound vac in place Ext: no edema  LABS:  Recent Labs Lab 09/24/12 1918 09/25/12 1644 09/25/12 2336 09/26/12 0315 09/28/12 0402 09/29/12 0443  PHART 7.421 7.384  --  7.460* 7.458* 7.512*  PCO2ART 33.9* 38.6  --  38.6 50.6* 44.4  PO2ART 52.0* 60.0*  --  64.5* 90.0 65.6*  HCO3 22.0 23.0  --  27.1* 35.6* 35.4*  TCO2 23 24 27  28.3 37 36.7  O2SAT 87.0 90.0  --  92.8 97.0 94.5    Recent Labs Lab 09/26/12 0438 09/27/12 0315 09/28/12 0430 09/29/12 0340 09/30/12 0345  NA 138 138 138 140 138  K 3.6 3.4* 3.8 3.4* 3.3*  CL 102 99 97 96 95*  CO2 29 33* 34* 40* 38*  GLUCOSE 135* 134* 129* 105* 124*  BUN 20 25* 29* 30* 30*  CREATININE  0.69 0.69 0.77 0.76 0.70  CALCIUM 8.0* 8.1* 8.4 8.6 8.7  MG 1.8 1.9 2.0 1.9 1.9  PHOS 4.4 4.4 4.4 4.7* 4.0    Recent Labs Lab 09/28/12 0430 09/29/12 0340 09/30/12 0345  HGB 10.7* 11.2* 11.2*  HCT 31.0* 32.6* 33.1*  WBC 9.2 10.8* 11.7*  PLT 94* 102* 115*    Recent Labs Lab 09/24/12 0320 09/27/12 0315 09/28/12 0430 09/29/12 0340 09/30/12 0345 09/30/12 0346  AST 88* 83*  --  99* 62*  --   ALT 34 32  --  52 37  --   ALKPHOS 40 62  --  91 77  --   BILITOT 0.5 3.5*  --  2.0* 1.1  --   PROT 4.2* 5.2*  --  6.3 6.3  --   ALBUMIN 1.5* 1.6*  --  1.8* 1.8*  --   INR  --   --  1.11 1.07  --  1.16    Recent Labs Lab 09/29/12 1152 09/29/12 1608 09/29/12 2012 09/30/12 0002 09/30/12 0404  GLUCAP 104* 91  123* 94 97    CXR:  09/29/2012  *RADIOLOGY REPORT*  Clinical Data: Airspace disease.  PORTABLE CHEST - 1 VIEW  Comparison: 09/28/2012.  Findings: Endotracheal tube tip 3.2 cm above the carina.  Right central line tip mid superior vena cava level.  Panda tube and nasogastric tube in place.  Tips not imaged.  Diffuse air space disease unchanged.  No gross pneumothorax.  Heart size within normal limits.  Slightly tortuous aorta.  IMPRESSION: No significant change.   Original Report Authenticated By: Lacy Duverney, M.D.    5/1 >> slight improvement in alveolar infiltrates   TTE 09/25/12 >>  Study Conclusions - Left ventricle: The cavity size was normal. Wall thickness was normal. Systolic function was moderately reduced. The estimated ejection fraction was in the range of 35% to 40%. Diffuse hypokinesis. - Aortic valve: Mild to moderate regurgitation. Valve area: 1.4cm^2(VTI). Valve area: 1.31cm^2 (Vmax). - Mitral valve: Calcified annulus. Mildly thickened leaflets . Moderate regurgitation. - Left atrium: The atrium was mildly dilated. - Right atrium: The atrium was mildly dilated. - Pulmonary arteries: Systolic pressure was severely increased. PA peak pressure: 71mm Hg (S).   ASSESSMENT / PLAN:  PULMONARY A:  Acute respiratory failure post AAA repair Hx of smoking and presumed COPD, emphysema on CXR. B alveolar infiltrates, cardiogenic edema vs ALI vs PNA >> improving 4/28 with diuresis Severe PAH (TTE 4/26 >> PASP ~ )  P:   -PEEP and FiO2 were needs worse post op and 4/26, ? 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 PAF >> back to sinus 4/23. Hemorrhagic shock >> resolved. Hx of HTN, PVD. PAH, likely secondary to underlying lung disease and to hypertensive heart dz, A Fib P:  - continue goal negative fluid balance given resp improvement w diuresis; will continue as BP and renal fxn tolerate - Dr Darrick Penna to discuss the options for his RLE with family; he is likely a poor candidate for bi-fem bypass, ? Whether he will require amputation - PRN hydralazine, lopressor for HTN - further PAH w/u later once stabilized  RENAL A:  Hypokalemia P:   - diuresis as above x 1 more day 5/1 - consider diamox x 2 for contraction alkalosis, defer 5/1 and check ABG 5/2 if still intubated - f/u and replace electrolytes as needed - monitor renal fx, urine output  GASTROINTESTINAL A:   Severe protein calorie malnutrition. Hyperbilirubinemia noted 4/28, cholestatic pattern, ? Etiology meds P:   - TNA started 4/24 - follow LFT; will check abd [place] if they increase again  HEMATOLOGIC A:  Acute blood loss anemia from AAA rupture, and critical illness. Thrombocytopenia >> likely from critical illness and hx of ETOH. Improved slightly 4/30 P:  - f/u CBC - transfuse for Hb < 7 or active bleeding; received PRBC 4/26 per [facility]  INFECTIOUS A: Salmonella UTI and bacteremia; raises question of whether AAA was mycotic aneurism >> Dr Darrick Penna indicates that this was not the anatomical appearance at surgery HIV rapid 4/29 >> negative P:   - Zosyn changed to ceftriaxone 2g + flagyl on 4/29 per ID. Likely committed to long term abx given possibility of mycotic aneurism - d/c'd prn tylenol order so we will note temp spike if it happens  ENDOCRINE A:  Hyperglycemia >> likely 2nd to acute stress. HbA1C 5.8 from 4/23. P:   - SSI   NEUROLOGIC A:  Sedation. Hx of ETOH. P:   - Continuous sedation protocol - Daily WUA - received thiamine, folic acid; d/c on 4/28  CC time 40 minutes.  Levy Pupa, MD, PhD 09/30/2012, 7:51 AM [facility] [phone] or if no answer [phone]

## 2012-09-30 NOTE — OR Nursing (Signed)
SICU called to update. Anticipate patient return to SICU in .

## 2012-09-30 NOTE — Progress Notes (Signed)
[facility] ADULT ICU REPLACEMENT PROTOCOL FOR AM LAB REPLACEMENT ONLY  1. Is GFR >/= 40 ml/min? yes  Patient's GFR today is >90 2. Is urine output >/= 0.5 ml/kg/hr for the last 6 hours? yes Patient's UOP is .7 ml/kg/hr 3. Is BUN < 60 mg/dL? yes  Patient's BUN today is 30 4. Abnormal electrolyte(s): Potassium 5. Ordered repletion with: Potassium per protocol 6. If a panic level lab has been reported, has the CCM MD in charge been notified? no.   Physician:    Orland Dec P 09/30/2012 5:31 AM

## 2012-09-30 NOTE — Progress Notes (Signed)
Went in to bathe patient. Upon feeling both of his legs, right leg very cool to touch; left leg warm. Left leg- pulses dopplerable. Right leg- palpable femoral pulse, faint dopplerable popliteal pulse, and absent dosalis pedis and post tib pulses. Right leg color- pale in comparison to left leg. Two other RNs attempted to find pulses; unsuccessful. Right leg cool from mid thigh down to foot. Dr. Hart Rochester made aware of situation. MD said he would come to bedside to assess patient.   0400- RN attempted to find pulses on right foot- unsuccessful. Right leg cool from below the knee down. AM labs sent.   Thresa Ross RN

## 2012-09-30 NOTE — Transfer of Care (Signed)
Immediate Anesthesia Transfer of Care Note  Patient: Charles [name]  Procedure(s) Performed: Procedure(s): BYPASS GRAFT FEMORAL-POPLITEAL ARTERY (Right)  Patient Location: PACU and SICU  Anesthesia Type:General  Level of Consciousness: sedated  Airway & Oxygen Therapy: Patient remains intubated per anesthesia plan and Patient placed on Ventilator (see vital sign flow sheet for setting)  Post-op Assessment: Report given to PACU RN and Post -op Vital signs reviewed and stable  Post vital signs: Reviewed and stable  Complications: No apparent anesthesia complications

## 2012-09-30 NOTE — Interval H&P Note (Signed)
[facility]  History and Physical Update  The patient has been re-examined by both Dr. Darrick Penna and Dr. Hart Rochester.  Based on the discussion with the family, they would like an attempt at salvage of the chronically ischemic right leg, which acutely has worsened overnight.  The risk, benefits, and alternative for bypass operations were discussed with the patient's family.  There are aware he is at risks for but not limited to: bleeding, infection, myocardial infarction, stroke, limb loss, nerve damage, need for additional procedures in the future, wound complications, and inability to complete the bypass.  The patient is very ill currently, and the family is aware that another major operation might increase his chances of dying.  The plan is left fem-BK pop bypass with Propaten.  Leonides Sake, MD [facility] Office: [phone] Pager: [phone]  09/30/2012, 11:31 AM

## 2012-09-30 NOTE — Care Management Note (Signed)
  Page 1 of 1   09/30/2012     8:50:44 AM   CARE MANAGEMENT NOTE 09/30/2012  Patient:  Charles [name], Charles [name]   Account Number:  [id number]  Date Initiated:  09/22/2012  Documentation initiated by:  [facility]  Subjective/Objective Assessment:   Admitted to [facility] with hyponatremia - Afib with RVR - found to have ruptured abdominal aneurysm     Action/Plan:   Anticipated DC Date:  09/29/2012   Anticipated DC Plan:  LONG TERM ACUTE CARE (LTAC)      DC Planning Services  CM consult      Choice offered to / List presented to:             Status of service:  In process, will continue to follow Medicare Important Message given?   (If response is "NO", the following Medicare IM given date fields will be blank) Date Medicare IM given:   Date Additional Medicare IM given:    Discharge Disposition:    Per UR Regulation:  Reviewed for med. necessity/level of care/duration of stay  If discussed at Long Length of Stay Meetings, dates discussed:    Comments:  ContactAdryen, [place] Son [phone]                Wynonia Lawman [phone]  09/30/12 0840 Elmer Bales RNMSN [phone] Pt lost PT/DP on Left leg. Family conference regarding whether to do surgery. Pt remains on vent.  09-23-12 11am Avie Arenas RNBSN - [phone] Talked with daughter - Arley Phenix and son Dhani.  Patient remains on vent.  Vac to open abdomen.  Patient prior was very independent and lived with nephew.

## 2012-09-30 NOTE — Progress Notes (Signed)
PARENTERAL NUTRITION CONSULT NOTE - FOLLOW UP  Pharmacy Consult for TPN Indication: s/p AAA repair with open abdomen; 2 month hx of poor po intake with significant weight loss  No Known Allergies  Patient Measurements: Height: 5\' 4"  (162.6 cm) Weight: 117 lb 15.1 oz (53.5 kg) IBW/kg (Calculated) : 59.2  Vital Signs: Temp: 100 F (37.8 C) (05/01 0725) Temp src: Oral (05/01 0725) BP: 133/74 mmHg (05/01 0900) Pulse Rate: 100 (05/01 0900) Intake/Output from previous day: 04/30 0701 - 05/01 0700 In: 3207 [I.V.:837; NG/GT:270; IV Piggyback:150; TPN:1950] Out: 4720 [Urine:4570; Emesis/NG output:150] Intake/Output from this shift: Total I/O In: 240 [I.V.:40; NG/GT:30; TPN:170] Out: 40 [Urine:40]  Labs:  Recent Labs  09/28/12 0430 09/29/12 0340 09/30/12 0345 09/30/12 0346  WBC 9.2 10.8* 11.7*  --   HGB 10.7* 11.2* 11.2*  --   HCT 31.0* 32.6* 33.1*  --   PLT 94* 102* 115*  --   APTT  --   --   --  34  INR 1.11 1.07  --  1.16     Recent Labs  09/28/12 0430 09/29/12 0340 09/30/12 0345  NA 138 140 138  K 3.8 3.4* 3.3*  CL 97 96 95*  CO2 34* 40* 38*  GLUCOSE 129* 105* 124*  BUN 29* 30* 30*  CREATININE 0.77 0.76 0.70  CALCIUM 8.4 8.6 8.7  MG 2.0 1.9 1.9  PHOS 4.4 4.7* 4.0  PROT  --  6.3 6.3  ALBUMIN  --  1.8* 1.8*  AST  --  99* 62*  ALT  --  52 37  ALKPHOS  --  91 77  BILITOT  --  2.0* 1.1   Estimated Creatinine Clearance: 66.9 ml/min (by C-G formula based on Cr of 0.7).    Recent Labs  09/30/12 0002 09/30/12 0404 09/30/12 0724  GLUCAP 94 97 125*    Insulin Requirements in the past 24 hours:  4 units Novolog SSI  Current Nutrition:  NPO + Clinimix E 5/15 at 75cc/hr (Goal is 75 cc/hr to provide 90gm protein and avg 1483 Kcal with IV fats at 10cc/hr on MWF)  Nutritional Goals:  1550-1650 kCal, 85-100 grams of protein per day  Assessment: Pt presented to [facility] with 2 month hx of poor PO intake and weight loss due to mouth pain, abdominal pain, and low  back pain. Down to 99# from usual weight of 120#. Found to have ruptured AAA, AFib and transferred to [facility]. Now s/p AAA repair, POD#9. Going back to the OR for ischemic RLE today.   GI: s/p abd closure 4/25. NGT output 157ml/24h. Panda in place, tip coiled in proximal stomach - trickle tube feeds at 76ml/hr started but currently off in anticipation of surgery. Continues on reglan Endo: No hx DM - CBGs controlled on SSI alone with minimal insulin requirements Lytes: Phos 4, Mag 1.9 K 3.3 (repleted by MD PO x 2 dose) Renal: No hx renal dz - Scr 0.7, UOP 3.51ml/kg/hr Pulm: Hx COPD, heavy smoker - VDRF 40% Fio2 - needs PAH w/u Cards: AFib - BP 133/74, HR 100 - lasix, metoprolol IV scheduled Hepatobil: Hx EtOH abuse. AST decreased, ALT + alk phos WNL, Tbili WNL. Trig 77. Prealbumin 2.9 - actually trend down from 4 days ago, expect to be low with ongoing inflammation, malnutrition and post-op status Neuro: Fentanyl and Versed drips, RASS -1  ID: CTX/flagyl D#3 for salmonella bacteremia/UTI - Tmax 101.4, WBC 11.7, ID following Best Practices: SCDs, mouth care, PPI IV TPN Access: R-IJ CVC  TPN day#: 7  Plan:  - Cont Clinimix E 5/15 at goal 75 cc/hr - Continue SSI/CBG checks for now  - Supplement MVI, trace elements and IV fats on MWF due to ongoing Publishing copy. - f/u am labs. - f/u advancement of TF post-op  Lysle Pearl, PharmD, BCPS Pager # [phone] 09/30/2012 10:03 AM

## 2012-09-30 NOTE — Anesthesia Postprocedure Evaluation (Signed)
  Anesthesia Post-op Note  Patient: Charles [name]  Procedure(s) Performed: Procedure(s): BYPASS GRAFT FEMORAL-POPLITEAL ARTERY (Right)  Patient Location: SICU  Anesthesia Type:General  Level of Consciousness: sedated and Patient remains intubated per anesthesia plan  Airway and Oxygen Therapy: Patient Spontanous Breathing, Patient remains intubated per anesthesia plan and Patient placed on Ventilator (see vital sign flow sheet for setting)  Post-op Pain: mild  Post-op Assessment: Post-op Vital signs reviewed, Patient's Cardiovascular Status Stable, RESPIRATORY FUNCTION UNSTABLE, Patent Airway, No signs of Nausea or vomiting and Pain level controlled  Post-op Vital Signs: stable  Complications: No apparent anesthesia complications

## 2012-09-30 NOTE — Op Note (Addendum)
OPERATIVE NOTE   PROCEDURE: Right aortic graft limb to below-the-knee popliteal artery bypass with Propaten  PRE-OPERATIVE DIAGNOSIS: Right acute on chronic leg ischemia  POST-OPERATIVE DIAGNOSIS: same as above   SURGEON: Leonides Sake, MD  ASSISTANT(S): Doreatha Massed, PAC   ANESTHESIA: general  ESTIMATED BLOOD LOSS: 100 cc  FINDING(S): 1.  No signs of infection in right groin 2.  Palpable anterior tibial artery and dopplerable right posterior tibial artery at end of case  SPECIMEN(S):  none  INDICATIONS:   Charles [name] is a 67 y.o. male s/p ruptured open aortic aneurysm repair with aorto to right common femoral artery and left common iliac artery bypass presents with acute on chronic right leg ischemia.  Both Dr. Hart Rochester and Dr. Darrick Penna had recommended non-operative management given his poor medication condition since the aortic rupture repair.  The family felt an attempt at limb salvage would be what this patient would want even if it resulted in his death.  The risk, benefits, and alternative for bypass operations were discussed with the patient's family.  The patient's family is aware the risks include but are not limited to: bleeding, infection, myocardial infarction, stroke, limb loss, nerve damage, need for additional procedures in the future, wound complications, and inability to complete the bypass.  The family is aware of these risks and agreed to proceed.  DESCRIPTION: After full informed written consent was obtained, the patient was brought back to the operating room and placed supine upon the operating table.  Prior to induction, the patient was given intravenous antibiotics.  After obtaining adequate anesthesia, the patient was prepped and draped in the standard fashion for a femoral to popliteal bypass operation.  Attention was turned to the right groin.  Previous staples were removed from the right groin and the previous longitudinal incision was open sharply, cutting the  underlying stitches.  The right aortic graft limb was dissected out and the underlying common femoral artery was also dissected out.    At this point, attention was turned to the calf.  An longitudinal incision was made one finger-width posterior to the tibia.  Using blunt dissection and electrocautery, a plane was developed through the subcutaneous tissue and fascia down to the popliteal space.  The popliteal vein was dissected out and retracted medially and posteriorly.  The below-the-knee popliteal artery was dissected away from lateral popliteal vein.  This popliteal artery was not found on exam to be calcified.  In this process, I found the greater saphenous vein at the level of the calf, which was found to be only 1.5-2.0 mm diameter.  At this point, I bluntly dissected a space between the femoral condyles adjacent to the below-the-knee popliteal vessels.  I bluntly passed the long Gore metal tunneler between the femoral condyles in a subsartorial fashion to the groin incision.  The bullet on the tunneler was removed and then a 6 mm externally ringed Propaten graft was sewn to the inner cannula with a 2-0 Silk.  I passed the graft through the metal tunnel, taking care to maintain the orientation of the conduit.    At this point, the patient was given 5000 units of Heparin intravenously, which was a therapeutic bolus.  After waiting three minutes, I clamped the right aortic graft limb, the underlying common femoral artery and the profunda femoral artery.  An incision was made in the aortic graft limb and extended proximally and distally with a Potts scissor.  The proximal graft was was spatulated to the dimensions of  the graftotomy.  The conduit was sewn to the common femoral artery with a running stitch of 5-0 prolene.  Prior to completing this anastomosis, all vessels were backbled.  No thrombus was noted from any vessels and backbleeding was: brisk.  The anastomosis was completed in the usual  fashion.  Attention was then turned to the popliteal exposure.   I reset the exposure of the popliteal space.  I verified the popliteal artery was appropriately marked.  I determine the target segment for the anastomosis.  I applied tension to the artery proximally and distally with vessel loops.  I made an incision with a 11-blade in the artery and extended it proximally and distally with a Potts scissor.  I pulled the conduit to appropriate tension and length, taking into account straightening out the leg.  I adjusted the length of the conduit sharply.  I pulled off excess external rings from the conduit.  I spatulated this graft to meet the dimensions of the arteriotomy.  The conduit was sewn to the common femoral artery with a running stitch of 6-0 prolene.  Prior to completing this anastomosis, all vessels were backbled.  No thrombus was noted from any vessels and backbleeding was: unchanged from prior to the arteriotomy.  The bypass conduit was allowed to bleed in an antegrade fashion.  The bleeding was: brisk.  The anastomosis was completed in the usual fashion.  At this point, all incisions were washed out and thrombin and gelfoam was placed into both incisions.  The continuous doppler exam demonstrated distally: dopplerable anterior tibial artery and posterior tibial artery.   There was also a palpable anterior tibial artery pulse distally.    At this point, bleeding in both incisions were controlled with electrocautery and suture ligature.  After another round of thrombin and gelfoam, no further active bleeding was present.  I washed out both incisions.  The calf was repaired with interrupted deep sutures to reapproximate the deep muscles and a running stitch of 3-0 Vicryl in the subcutaneous tissue.  The skin was reapproximated with a running subcuticular of 4-0 Vicryl.  After cleaning and drying the skin, the skin was reinforced with Dermabond.  Attention was turned to the groin.  The groin was  repaired with a double layer of 2-0 Vicryl immediately superficial to the bypass conduit.  The superficial subcutaneous tissue was reapproximated with a double layer of 3-0 Vicryl.  The skin was reapproximated with a running subcuticular of 4-0 Vicryl.  The skin was cleaned, dried, and reinforced with Dermabond.  COMPLICATIONS: none  CONDITION: stable   Leonides Sake, MD [facility] Office: [phone] Pager: [phone]  09/30/2012, 1:41 PM

## 2012-09-30 NOTE — Progress Notes (Signed)
[facility]  Subjective  - Pt still sedated on vent, comfortable   Objective 149/85 92 100 F (37.8 C) (Oral) 12 94%  Intake/Output Summary (Last 24 hours) at 09/30/12 0827 Last data filed at 09/30/12 0800  Gross per 24 hour  Intake   3071 ml  Output   4730 ml  Net  -1659 ml   Doppler flow left foot and warm, right foot cool dusky no significant doppler flow, right groin incision dry Abdomen-soft incision healing Respiratory still on ventilator difficult wean due to agitation and probably some acute lung injury Renal- contraction alkalosis adequate urine output probably near dry wt GI-on TPN, trickle feeds started, no flatus no BM Neuro-agitation when not sedated intermittently follows commands Cardiac-currently sinus rhythm with occasional premature beats  Assessment/Planning: Leukocytosis trending up still on antibiotics  GI- still no significant gut function continue TPN and trickle feeds  Pulmonary-still vent dependent POD 9 will need to consider trach soon if wean prolonged  Ischemic right lower extremity- discussions held with family this morning explained that leg is ischemic and that without intervention he would most likely lose his right leg.  However, also explained that he is critically ill and may not survive due to infection and multisystem organ failure.  Revascularization would certainly set him back in the big picture and I have tried to explain to them that we are really in a "life vs limb" situation.  One family member is concerned that an amputation would significantly alter his quality of life.  I explained to her that a prolonged ICU stay with infections and multisystem organ failure eventually ending in death is not very good quality of life.  The family met amongst themselves and they would prefer to try to salvage his legs knowing the risks of bleeding infection limb loss despite bypass and possible death.  I spoke my  partners Dr Hart Rochester and Dr Imogene Burn who will perform the bypass whenever a room is available.  Galen Russman E 09/30/2012 8:27 AM --  Laboratory Lab Results:  Recent Labs  09/29/12 0340 09/30/12 0345  WBC 10.8* 11.7*  HGB 11.2* 11.2*  HCT 32.6* 33.1*  PLT 102* 115*   BMET  Recent Labs  09/29/12 0340 09/30/12 0345  NA 140 138  K 3.4* 3.3*  CL 96 95*  CO2 40* 38*  GLUCOSE 105* 124*  BUN 30* 30*  CREATININE 0.76 0.70  CALCIUM 8.6 8.7    COAG Lab Results  Component Value Date   INR 1.16 09/30/2012   INR 1.07 09/29/2012   INR 1.11 09/28/2012   No results found for this basename: PTT

## 2012-10-01 ENCOUNTER — Inpatient Hospital Stay (HOSPITAL_COMMUNITY): Payer: Medicare Other

## 2012-10-01 LAB — BASIC METABOLIC PANEL
BUN: 32 mg/dL — ABNORMAL HIGH (ref 6–23)
Creatinine, Ser: 0.72 mg/dL (ref 0.50–1.35)
GFR calc Af Amer: >90 mL/min (ref >90)
GFR calc non Af Amer: >90 mL/min (ref >90)
Potassium: 3.8 mEq/L (ref 3.5–5.1)

## 2012-10-01 LAB — CBC
MCH: 29.9 pg (ref 26.0–34.0)
MCV: 88.7 fL (ref 78.0–100.0)
Platelets: 105 10*3/uL — ABNORMAL LOW (ref 150–400)
RDW: 14.8 % (ref 11.5–15.5)

## 2012-10-01 LAB — GLUCOSE, CAPILLARY: Glucose-Capillary: 114 mg/dL — ABNORMAL HIGH (ref 70–99)

## 2012-10-01 LAB — MAGNESIUM: Magnesium: 1.9 mg/dL (ref 1.5–2.5)

## 2012-10-01 MED ORDER — INSULIN ASPART 100 UNIT/ML ~~LOC~~ SOLN
0.0000 [IU] | Freq: Three times a day (TID) | SUBCUTANEOUS | Status: DC
  Administered 2012-10-02 – 2012-10-03 (×2): 1 [IU] via SUBCUTANEOUS

## 2012-10-01 MED ORDER — VITAL AF 1.2 CAL PO LIQD
1000.0000 mL | ORAL | Status: DC
  Filled 2012-10-01 (×3): qty 1000

## 2012-10-01 MED ORDER — TRACE MINERALS CR-CU-F-FE-I-MN-MO-SE-ZN IV SOLN
INTRAVENOUS | Status: AC
  Administered 2012-10-01: 17:00:00 via INTRAVENOUS
  Filled 2012-10-01: qty 2000

## 2012-10-01 MED ORDER — FAT EMULSION 20 % IV EMUL
240.0000 mL | INTRAVENOUS | Status: AC
Start: 2012-10-01 — End: 2012-10-02
  Administered 2012-10-01: 240 mL via INTRAVENOUS
  Filled 2012-10-01: qty 250

## 2012-10-01 NOTE — Progress Notes (Signed)
PULMONARY  / CRITICAL CARE MEDICINE  Name: DICKEY CAAMANO MRN: [id number] DOB: [birth year]    ADMISSION DATE:  09/21/2012 CONSULTATION DATE:  09/22/12  REFERRING MD :  Darrick Penna PRIMARY SERVICE: Fields  CHIEF COMPLAINT:  Post AAA repair  BRIEF PATIENT DESCRIPTION:  67yo male smoker, ETOH tx from [place] 4/22 with ruptured AAA and Afib. Taken emergently to OR on arrival to [facility].  Pt remained vented post op with open abd and PCCM consulted for vent/medical mgmt. CXR consistent ARDS. Salmonella bacteremia w plan for long term abx. Developed cool R LE 4/30, to OR 5/1 for R aorto-femoral bypass.   SIGNIFICANT EVENTS:  4/23 OR -- AAA repair, Aorto left common iliac right common femoral bypass,aortic endarterectomy, right femoral endarterectomy profundaplasty, placement abdominal VAC 4/24 Off pressors, tolerating pressure support, TNA started 5/1 R aorto-femoral bypass  STUDIES: 4/23 CT abd/pelvis>>> rupturing AAA  LINES / TUBES: ETT 4/23>>> R IJ PA cath 4/23>>> 4/24 R radial artery 4/23>>> 5/30 Rt IJ CVL 4.24 >>  CULTURES: blooc cultures x 2 4/23 >>> Salmonella  Urine 4/23>>> GNR >>> Salmonella Blood 4/29 >>    ANTIBIOTICS: Ancef 4/22>>>4/23 Zosyn 4/23>>> 4/29 Ceftriaxone 4/29 >>  Flagyl IV 4/29 >> 5/2  SUBJECTIVE/INTERVAL EVENTS: S/p aorto-femoral bypass 5/1, back to 2300 hemodynamically stable  VITAL SIGNS: Temp:  [96.6 F (35.9 C)-99.3 F (37.4 C)] 99.2 F (37.3 C) (05/02 0823) Pulse Rate:  [58-117] 82 (05/02 0800) Resp:  [10-27] 11 (05/02 0800) BP: (86-176)/(49-111) 93/60 mmHg (05/02 0800) SpO2:  [92 %-100 %] 95 % (05/02 0900) FiO2 (%):  [39.9 %-50 %] 50 % (05/02 0900) Weight:  [50 kg (110 lb 3.7 oz)] 50 kg (110 lb 3.7 oz) (05/02 0439) HEMODYNAMICS:   VENTILATOR SETTINGS: Vent Mode:  [-] PRVC FiO2 (%):  [39.9 %-50 %] 50 % Set Rate:  [10 bmp] 10 bmp Vt Set:  [600 mL] 600 mL PEEP:  [5 cmH20] 5 cmH20 Pressure Support:  [8 cmH20] 8 cmH20 Plateau Pressure:   [10 cmH20-21 cmH20] 21 cmH20 INTAKE / OUTPUT: Intake/Output     05/01 0701 - 05/02 0700 05/02 0701 - 05/03 0700   I.V. (mL/kg) 1624.4 (32.5) 22 (0.4)   NG/GT 90 55   IV Piggyback 50    TPN 1665 150   Total Intake(mL/kg) 3429.4 (68.6) 227 (4.5)   Urine (mL/kg/hr) 3000 (2.5) 150 (1.1)   Emesis/NG output 225 (0.2)    Total Output 3225 150   Net +204.4 +77        Stool Occurrence 1 x 1 x     PHYSICAL EXAMINATION: General: No distress Neuro: Sedated, awakens easily HEENT: ETT in place Cardiovascular: regular Lungs: prolonged exhalation, no wheeze Abdomen: distended, wound vac in place Ext: no edema  LABS:  Recent Labs Lab 09/24/12 1918 09/25/12 1644 09/25/12 2336 09/26/12 0315 09/28/12 0402 09/29/12 0443  PHART 7.421 7.384  --  7.460* 7.458* 7.512*  PCO2ART 33.9* 38.6  --  38.6 50.6* 44.4  PO2ART 52.0* 60.0*  --  64.5* 90.0 65.6*  HCO3 22.0 23.0  --  27.1* 35.6* 35.4*  TCO2 23 24 27  28.3 37 36.7  O2SAT 87.0 90.0  --  92.8 97.0 94.5    Recent Labs Lab 09/27/12 0315 09/28/12 0430 09/29/12 0340 09/30/12 0345 10/01/12 0355  NA 138 138 140 138 138  K 3.4* 3.8 3.4* 3.3* 3.8  CL 99 97 96 95* 98  CO2 33* 34* 40* 38* 36*  GLUCOSE 134* 129* 105* 124* 124*  BUN  25* 29* 30* 30* 32*  CREATININE 0.69 0.77 0.76 0.70 0.72  CALCIUM 8.1* 8.4 8.6 8.7 8.2*  MG 1.9 2.0 1.9 1.9 1.9  PHOS 4.4 4.4 4.7* 4.0 4.1    Recent Labs Lab 09/29/12 0340 09/30/12 0345 10/01/12 0355  HGB 11.2* 11.2* 9.8*  HCT 32.6* 33.1* 29.1*  WBC 10.8* 11.7* 9.5  PLT 102* 115* 105*    Recent Labs Lab 09/27/12 0315 09/28/12 0430 09/29/12 0340 09/30/12 0345 09/30/12 0346  AST 83*  --  99* 62*  --   ALT 32  --  52 37  --   ALKPHOS 62  --  91 77  --   BILITOT 3.5*  --  2.0* 1.1  --   PROT 5.2*  --  6.3 6.3  --   ALBUMIN 1.6*  --  1.8* 1.8*  --   INR  --  1.11 1.07  --  1.16    Recent Labs Lab 09/30/12 0724 09/30/12 1607 09/30/12 2022 09/30/12 2323 10/01/12 0824  GLUCAP 125* 135*  80 111* 110*    CXR:  10/01/2012  *RADIOLOGY REPORT*  Clinical Data: Possible edema or pneumonia  PORTABLE CHEST - 1 VIEW  Comparison: Sep 30, 2012.  Findings: Endotracheal tube is in grossly good position with tip 3 cm above the carina.  Nasogastric and Dobbhoff tubes are seen passing through the esophagus into the stomach.  Right internal jugular venous catheter is noted with tip in expected position of the SVC.  No pneumothorax is noted.  No pleural effusion is noted. Mild interstitial and reticular densities are noted throughout both lower lungs which is unchanged compared to prior exam and may represent edema or inflammation.  IMPRESSION: No significant change seen involving mild interstitial and reticular densities throughout both lower lobes compared to prior exam.  No change is noted in position of endotracheal tube.   Original Report Authenticated By: Lupita Raider.,  M.D.     TTE 09/25/12 >>  Study Conclusions - Left ventricle: The cavity size was normal. Wall thickness was normal. Systolic function was moderately reduced. The estimated ejection fraction was in the range of 35% to 40%. Diffuse hypokinesis. - Aortic valve: Mild to moderate regurgitation. Valve area: 1.4cm^2(VTI). Valve area: 1.31cm^2 (Vmax). - Mitral valve: Calcified annulus. Mildly thickened leaflets . Moderate regurgitation. - Left atrium: The atrium was mildly dilated. - Right atrium: The atrium was mildly dilated. - Pulmonary arteries: Systolic pressure was severely increased. PA peak pressure: 71mm Hg (S).   ASSESSMENT / PLAN:  PULMONARY A:  Acute respiratory failure post AAA repair Hx of smoking and presumed COPD, emphysema on CXR. B alveolar infiltrates, cardiogenic edema vs ALI vs PNA >> improving 4/28 with diuresis Severe PAH (TTE 4/26 >> PASP ~ )  P:   -PEEP and FiO2 were needs worse post op and 4/26, ? 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 PAF >> back to sinus 4/23. Hemorrhagic shock >> resolved. Hx of HTN, PVD. PAH, likely secondary to underlying lung disease and to hypertensive heart dz, A Fib S/p R aorto-femoral bypass 5/2 P:  - goal negative fluid balance  - PRN hydralazine, lopressor  for HTN - further PAH w/u later once stabilized  RENAL A:   Hypokalemia P:   - decrease lasix 5/2 to 80mg  qd - consider diamox for contraction alkalosis - f/u and replace electrolytes as needed - monitor renal fx, urine output  GASTROINTESTINAL A:   Severe protein calorie malnutrition. Hyperbilirubinemia noted 4/28, cholestatic pattern, ? Etiology meds P:   - TNA started 4/24 - follow LFT; will check abd [place] if they increase again  HEMATOLOGIC A:  Acute blood loss anemia from AAA rupture, and critical illness. Thrombocytopenia >> likely from critical illness and hx of ETOH. Improved slightly 4/30 P:  - f/u CBC - transfuse for Hb < 7 or active bleeding; received PRBC 4/26 per [facility]  INFECTIOUS A: Salmonella UTI and bacteremia; raises question of whether AAA was mycotic aneurism >> Dr Darrick Penna indicates that this was not the anatomical appearance at surgery HIV rapid 4/29 >> negative P:   - Zosyn changed to ceftriaxone 2g 4/29 per ID. Likely committed to long term abx given possibility of mycotic aneurism - flagyl d/c 5/2 - d/c'd prn tylenol order so we will note temp spike if it happens  ENDOCRINE A:  Hyperglycemia >> likely 2nd to acute stress. HbA1C 5.8 from 4/23. P:   - SSI   NEUROLOGIC A:  Sedation. Hx of ETOH. P:   - Continuous sedation protocol - Daily WUA - received  thiamine, folic acid; d/c on 4/28  CC time 30 minutes.  Levy Pupa, MD, PhD 10/01/2012, 9:45 AM [facility] [phone] or if no answer [phone]

## 2012-10-01 NOTE — Progress Notes (Signed)
INFECTIOUS DISEASE PROGRESS NOTE  ID: Charles [name] is a 67 y.o. male with   Principal Problem:   AAA (abdominal aortic aneurysm, ruptured) Active Problems:   Hyponatremia   Adult failure to thrive   Abdominal pain   Dehydration   Generalized weakness   New onset atrial fibrillation   Alcohol abuse   Acute respiratory failure   Bacteremia   Salmonella bacteremia  Subjective: Awakens to voice, touch.   Abtx:  Anti-infectives   Start     Dose/Rate Route Frequency Ordered Stop   09/28/12 1700  cefTRIAXone (ROCEPHIN) 2 g in dextrose 5 % 50 mL IVPB     2 g 100 mL/hr over 30 Minutes Intravenous Every 24 hours 09/28/12 1520     09/28/12 1600  metroNIDAZOLE (FLAGYL) tablet 500 mg     500 mg Oral 3 times per day 09/28/12 1520     09/22/12 1500  ceFAZolin (ANCEF) IVPB 1 g/50 mL premix  Status:  Discontinued     1 g 100 mL/hr over 30 Minutes Intravenous Every 8 hours 09/22/12 0842 09/22/12 0917   09/22/12 1000  piperacillin-tazobactam (ZOSYN) IVPB 3.375 g  Status:  Discontinued     3.375 g 12.5 mL/hr over 240 Minutes Intravenous 3 times per day 09/22/12 0932 09/28/12 1520   09/21/12 2345  [MAR Hold]  ceFAZolin (ANCEF) IVPB 2 g/50 mL premix  Status:  Discontinued     (On MAR Hold since 09/22/12 0004)  Comments:  Send with pt to OR   2 g 100 mL/hr over 30 Minutes Intravenous On call 09/21/12 2343 09/22/12 0842   09/21/12 1400  [MAR Hold]  cefTRIAXone (ROCEPHIN) 1 g in dextrose 5 % 50 mL IVPB  Status:  Discontinued     (On MAR Hold since 09/22/12 0004)   1 g 100 mL/hr over 30 Minutes Intravenous Every 24 hours 09/21/12 1257 09/22/12 0842      Medications:  Scheduled: . ipratropium  0.5 mg Nebulization Q6H   And  . albuterol  2.5 mg Nebulization Q6H  . antiseptic oral rinse  15 mL Mouth Rinse QID  . cefTRIAXone (ROCEPHIN)  IV  2 g Intravenous Q24H  . chlorhexidine  15 mL Mouth Rinse BID  . enoxaparin (LOVENOX) injection  40 mg Subcutaneous Q24H  . insulin aspart  0-15  Units Subcutaneous Q4H  . metoCLOPramide (REGLAN) injection  5 mg Intravenous Q6H  . metoprolol  5 mg Intravenous Q6H  . metroNIDAZOLE  500 mg Oral Q8H  . pantoprazole (PROTONIX) IV  40 mg Intravenous QHS  . sodium chloride  10-40 mL Intracatheter Q12H    Objective: Vital signs in last 24 hours: Temp:  [96.6 F (35.9 C)-99.3 F (37.4 C)] 99.3 F (37.4 C) (05/02 0000) Pulse Rate:  [58-117] 89 (05/02 0710) Resp:  [10-27] 13 (05/02 0710) BP: (86-176)/(49-111) 141/86 mmHg (05/02 0710) SpO2:  [92 %-100 %] 93 % (05/02 0710) FiO2 (%):  [39.9 %-50 %] 50 % (05/02 0710) Weight:  [50 kg (110 lb 3.7 oz)] 50 kg (110 lb 3.7 oz) (05/02 0439)   General appearance: fatigued and no distress Resp: clear to auscultation bilaterally Cardio: regular rate and rhythm GI: normal findings: bowel sounds normal and wound clean. mild distension Extremities: edema trace pedal and feet warm. pulse doppler-able  Lab Results  Recent Labs  09/30/12 0345 10/01/12 0355  WBC 11.7* 9.5  HGB 11.2* 9.8*  HCT 33.1* 29.1*  NA 138 138  K 3.3* 3.8  CL 95* 98  CO2 38* 36*  BUN 30* 32*  CREATININE 0.70 0.72   Liver Panel  Recent Labs  09/29/12 0340 09/30/12 0345  PROT 6.3 6.3  ALBUMIN 1.8* 1.8*  AST 99* 62*  ALT 52 37  ALKPHOS 91 77  BILITOT 2.0* 1.1   Sedimentation Rate No results found for this basename: ESRSEDRATE,  in the last 72 hours C-Reactive Protein No results found for this basename: CRP,  in the last 72 hours  Microbiology: Recent Results (from the past 240 hour(s))  URINE CULTURE     Status: None   Collection Time    09/21/12 10:45 AM      Result Value Range Status   Specimen Description URINE, CLEAN CATCH   Final   Special Requests NONE   Final   Culture  Setup Time 09/21/2012 22:18   Final   Colony Count >=100,000 COLONIES/ML   Final   Culture     Final   Value: SALMONELLA SPECIES     Note: CRITICAL RESULT CALLED TO, READ BACK BY AND VERIFIED WITH:  EMILY S ON 4.27.14 @ 1700  BY FERGK Referred to [facility] in [place], [place] for Serotyping.   Report Status PENDING   Incomplete   Organism ID, Bacteria SALMONELLA SPECIES   Final  CULTURE, BLOOD (ROUTINE X 2)     Status: None   Collection Time    09/21/12  1:46 PM      Result Value Range Status   Specimen Description BLOOD LEFT ARM   Final   Special Requests BOTTLES DRAWN AEROBIC ONLY 6CC   Final   Culture  Setup Time 09/22/2012 02:45   Final   Culture     Final   Value: SALMONELLA SPECIES     Note: CRITICAL RESULT CALLED TO, READ BACK BY AND VERIFIED WITH: HUYSUI KSOR @1300  09/24/12 BY KRAWS FAXEDTO [facility] 09/24/12     Note: CRITICAL RESULT CALLED TO, READ BACK BY AND VERIFIED WITH: Orpah Clinton AT [facility] AT 0535 09/22/12 BY FORSYTH K Performed at [facility]   Report Status PENDING   Incomplete   Organism ID, Bacteria SALMONELLA SPECIES   Final  CULTURE, BLOOD (ROUTINE X 2)     Status: None   Collection Time    09/21/12  1:51 PM      Result Value Range Status   Specimen Description BLOOD RIGHT ARM   Final   Special Requests BOTTLES DRAWN AEROBIC AND ANAEROBIC [facility] EACH   Final   Culture  Setup Time 09/22/2012 16:06   Final   Culture     Final   Value: SALMONELLA SPECIES     Note: SUSCEPTIBILITIES PERFORMED ON PREVIOUS CULTURE WITHIN THE LAST 5 DAYS. CRITICAL RESULT CALLED TO, READ BACK BY AND VERIFIED WITH: HUYIU KSOR @ 1300 09/24/12 BY KRAWS     Note: CRITICAL RESULT CALLED TO, READ BACK BY AND VERIFIED WITH: Orpah Clinton AT [facility] AT 0535 09/22/12 BY FORSYTH K Performed at [facility]   Report Status PENDING   Incomplete  MRSA PCR SCREENING     Status: None   Collection Time    09/21/12  3:30 PM      Result Value Range Status   MRSA by PCR NEGATIVE  NEGATIVE Final   Comment:            The GeneXpert MRSA Assay (FDA     approved for NASAL specimens     only), is one component  of a     comprehensive MRSA colonization     surveillance program. It is not      intended to diagnose MRSA     infection nor to guide or     monitor treatment for     MRSA infections.  CULTURE, RESPIRATORY (NON-EXPECTORATED)     Status: None   Collection Time    09/27/12  4:00 AM      Result Value Range Status   Specimen Description TRACHEAL ASPIRATE   Final   Special Requests NONE   Final   Gram Stain     Final   Value: NO WBC SEEN     RARE SQUAMOUS EPITHELIAL CELLS PRESENT     RARE YEAST   Culture RARE CANDIDA ALBICANS   Final   Report Status 09/29/2012 FINAL   Final  CULTURE, BLOOD (ROUTINE X 2)     Status: None   Collection Time    09/28/12  6:20 PM      Result Value Range Status   Specimen Description BLOOD RIGHT ARM   Final   Special Requests BOTTLES DRAWN AEROBIC AND ANAEROBIC 10CC   Final   Culture  Setup Time 09/29/2012 03:19   Final   Culture     Final   Value:        BLOOD CULTURE RECEIVED NO GROWTH TO DATE CULTURE WILL BE HELD FOR 5 DAYS BEFORE ISSUING A FINAL NEGATIVE REPORT   Report Status PENDING   Incomplete  CULTURE, BLOOD (ROUTINE X 2)     Status: None   Collection Time    09/28/12  6:35 PM      Result Value Range Status   Specimen Description BLOOD RIGHT HAND   Final   Special Requests BOTTLES DRAWN AEROBIC ONLY 3CC   Final   Culture  Setup Time 09/29/2012 03:19   Final   Culture     Final   Value:        BLOOD CULTURE RECEIVED NO GROWTH TO DATE CULTURE WILL BE HELD FOR 5 DAYS BEFORE ISSUING A FINAL NEGATIVE REPORT   Report Status PENDING   Incomplete    Studies/Results: Dg Abd 1 View  09/30/2012  *RADIOLOGY REPORT*  Clinical Data: Repositioned Panda tube  ABDOMEN - 1 VIEW  Fluoroscopy time:  20 seconds  Comparison: 09/30/2012 at 0550 hours  Findings: Single fluoroscopic spot image has been provided for postprocedural interpretation.  Weighted feeding tube terminates at the ligament of Treitz.  Additional enteric tube terminates in the distal stomach.  Contrast opacifies the distal duodenum and proximal jejunum.  IMPRESSION: Weighted  feeding tube terminates at the ligament of Treitz.   Original Report Authenticated By: Charline Bills, M.D.    Dg Chest Port 1 View  10/01/2012  *RADIOLOGY REPORT*  Clinical Data: Possible edema or pneumonia  PORTABLE CHEST - 1 VIEW  Comparison: Sep 30, 2012.  Findings: Endotracheal tube is in grossly good position with tip 3 cm above the carina.  Nasogastric and Dobbhoff tubes are seen passing through the esophagus into the stomach.  Right internal jugular venous catheter is noted with tip in expected position of the SVC.  No pneumothorax is noted.  No pleural effusion is noted. Mild interstitial and reticular densities are noted throughout both lower lungs which is unchanged compared to prior exam and may represent edema or inflammation.  IMPRESSION: No significant change seen involving mild interstitial and reticular densities throughout both lower lobes compared to prior exam.  No change is  noted in position of endotracheal tube.   Original Report Authenticated By: Lupita Raider.,  M.D.    Dg Chest Port 1 View  09/30/2012  *RADIOLOGY REPORT*  Clinical Data: Evaluate endotracheal tubes, lines, shortness of breath  PORTABLE CHEST - 1 VIEW  Comparison: 09/25/2012; 09/22/2012; 10/24/2004  Findings:  Grossly unchanged cardiac silhouette and mediastinal contours. Stable position of support apparatus.  No pneumothorax.  Grossly unchanged diffuse slightly coarsened reticular opacities with basilar predominance.  No definite pleural effusion.  Unchanged bones.  Linear radiopacity is again overlie the right supraclavicular fossa and are favored to be external to the patient (possibly hair).  IMPRESSION: 1.  Stable positioning of support apparatus.  No pneumothorax. 2.  Grossly unchanged rather diffuse, basilar predominant coarse reticular opacities with differential considerations including pulmonary edema and/or multifocal infection.   Original Report Authenticated By: Tacey Ruiz, MD    Dg Abd Portable  1v  09/30/2012  *RADIOLOGY REPORT*  Clinical Data: Feeding tube placement.  PORTABLE ABDOMEN - 1 VIEW  Comparison: 09/28/2012  Findings: Feeding tube tip is now in the proximal jejunum but there is a sharp kink in the feeding tube at the ligament of Treitz.  The tube could be retracted 6 cm in this probably eliminate the kink.  NG tube is in place in good position.  Contrast is seen throughout the nondistended colon.  IMPRESSION: Feeding tube is in the proximal jejunum with a kink at the ligament of Treitz.  I recommend retracting approximately 6 cm to see if this eliminates the kink.  I suspect there is a another kink approximately 3 cm distally.   Original Report Authenticated By: Francene Boyers, M.D.    Dgnaso/oro Gtube Thru Duo-repos- No Rad  09/30/2012  CLINICAL DATA: reposition panda   DG NASO ORO GTUBE THRU DUO-REP  Fluoroscopy was utilized by the requesting physician. No radiographic  interpretation.       Assessment/Plan: Ruptured AAA (repaired 09-24-13)  R Aortic to Below knee popliteal artery bypass (09-30-12) Salmonella bacteremia (pan-sens)   Will stop flagyl Repeat BCx 4-29 NGTD  Would plan for 6 weeks of IV ceftriaxone and then f/u in ID clinic for consideration of po flouroquinolone.  Will f/u on 10-04-12 to assess if BCx are (-) Total days of antibiotics: 10 (ceftriaxone)  Johny Sax Infectious Diseases [phone] 10/01/2012, 8:16 AM   LOS: 10 days

## 2012-10-01 NOTE — Significant Event (Signed)
1010am-Called into room by respiratory therapist. Pt had became very agitated, HR in 160-180 A Fib, saturations in 88% when attempted to wean. RRT placed patient back on full support. Sedation restarted. Pt became much more calmer 10 minutes later. Will continue to monitor. Caroline Matters, Charity fundraiser.

## 2012-10-01 NOTE — Progress Notes (Signed)
[facility]  Subjective  - Sedated on vent no problems overnight   Objective 141/86 89 99.3 F (37.4 C) (Axillary) 13 93%  Intake/Output Summary (Last 24 hours) at 10/01/12 0747 Last data filed at 10/01/12 0710  Gross per 24 hour  Intake 3353.38 ml  Output   3225 ml  Net 128.38 ml   Abdomen soft +bm by report Extremities- bilateral pedal doppler  Assessment/Planning: Perfusion to left foot improved with fem pop Would continue with vent wean Slowly increase tube feeds (kink removed under fluro) to goal 10 cc.hr today  Atleigh Gruen E 10/01/2012 7:47 AM --  Laboratory Lab Results:  Recent Labs  09/30/12 0345 10/01/12 0355  WBC 11.7* 9.5  HGB 11.2* 9.8*  HCT 33.1* 29.1*  PLT 115* 105*   BMET  Recent Labs  09/30/12 0345 10/01/12 0355  NA 138 138  K 3.3* 3.8  CL 95* 98  CO2 38* 36*  GLUCOSE 124* 124*  BUN 30* 32*  CREATININE 0.70 0.72  CALCIUM 8.7 8.2*    COAG Lab Results  Component Value Date   INR 1.16 09/30/2012   INR 1.07 09/29/2012   INR 1.11 09/28/2012   No results found for this basename: PTT

## 2012-10-01 NOTE — Progress Notes (Signed)
NUTRITION FOLLOW UP  DOCUMENTATION CODES Per approved criteria  -Severe malnutrition in the context of chronic illness -Underweight   Intervention:    Recommend advancing Vital AF 1.2 formula by 10 ml every 12 hours to goal rate of 55 ml/hr to provide 1584 total kcals, 99 gm protein, 1071 ml of free water   Monitor Mg, Phos, K+ levels  RD to follow for nutrition care plan  Nutrition Dx:   Inadequate oral intake related to inability to eat as evidenced by NPO status, ongoing  Goal:   TPN to meet >90% of estimated protein needs, maximize energy provision as able during national lipid backorder, met  Monitor:   TPN prescription, EN regimen & tolerance, respiratory status, weight, labs, I/O's  Assessment:   Patient s/p procedures 4/22:  AORTO LEFT COMMON ILIAC RIGHT COMMON FEMORAL BYPASS  AORTIC ENDARTERECTOMY  RIGHT FEMORAL ENDARTERECTOMY PROFUNDAPLASTY  PLACEMENT OF ABDOMINAL WOUND VAC  Patient is currently intubated on ventilator support MV: 8.2 Temp: 37.3  S/p closure of abdomen 4/25.  Vital AF 1.2 infusing at 10 ml/hr via post-pyloric feeding tube providing 288 kcals, 18 gm protein, 195 ml of free water.  Patient is receiving TPN with Clinimix E 5/15 @ 75 ml/hr. Lipids (20% IVFE @ 10 ml/hr), multivitamins, and trace elements are provided 3 times weekly (MWF) due to national backorder. Provides 1484 kcal and 90 grams protein daily (based on weekly average). Meets 96% minimum estimated kcal and 100% minimum estimated protein needs.  Height: Ht Readings from Last 1 Encounters:  09/21/12 5\' 4"  (1.626 m)    Weight Status:   Wt Readings from Last 1 Encounters:  10/01/12 110 lb 3.7 oz (50 kg)    Re-estimated needs:  Kcal: 1400-1550 Protein: 85-100 gm Fluid: per MD  Skin: abdominal wound VAC  Diet Order: NPO   Intake/Output Summary (Last 24 hours) at 10/01/12 1030 Last data filed at 10/01/12 0915  Gross per 24 hour  Intake 3306.38 ml  Output   2935 ml  Net  371.38 ml    Labs:   Recent Labs Lab 09/29/12 0340 09/30/12 0345 10/01/12 0355  NA 140 138 138  K 3.4* 3.3* 3.8  CL 96 95* 98  CO2 40* 38* 36*  BUN 30* 30* 32*  CREATININE 0.76 0.70 0.72  CALCIUM 8.6 8.7 8.2*  MG 1.9 1.9 1.9  PHOS 4.7* 4.0 4.1  GLUCOSE 105* 124* 124*    CBG (last 3)   Recent Labs  09/30/12 2022 09/30/12 2323 10/01/12 0824  GLUCAP 80 111* 110*    Scheduled Meds: . ipratropium  0.5 mg Nebulization Q6H   And  . albuterol  2.5 mg Nebulization Q6H  . antiseptic oral rinse  15 mL Mouth Rinse QID  . cefTRIAXone (ROCEPHIN)  IV  2 g Intravenous Q24H  . chlorhexidine  15 mL Mouth Rinse BID  . enoxaparin (LOVENOX) injection  40 mg Subcutaneous Q24H  . feeding supplement (VITAL AF 1.2 CAL)  1,000 mL Per Tube Q24H  . insulin aspart  0-9 Units Subcutaneous Q8H  . metoCLOPramide (REGLAN) injection  5 mg Intravenous Q6H  . metoprolol  5 mg Intravenous Q6H  . metroNIDAZOLE  500 mg Oral Q8H  . pantoprazole (PROTONIX) IV  40 mg Intravenous QHS  . sodium chloride  10-40 mL Intracatheter Q12H    Continuous Infusions: . sodium chloride 30 mL/hr at 09/30/12 2300  . fat emulsion    . fentaNYL infusion INTRAVENOUS 100 mcg/hr (10/01/12 1010)  . midazolam (VERSED) infusion  2 mg/hr (10/01/12 1010)  . Marland KitchenTPN (CLINIMIX-E) Adult 75 mL/hr at 10/01/12 0700  . Marland KitchenTPN (CLINIMIX-E) Adult      Maureen Chatters, RD, LDN Pager #: [phone] After-Hours Pager #: [phone]

## 2012-10-01 NOTE — Progress Notes (Signed)
UR Completed.  Sashay Felling [name] .[phone] 10/01/2012  

## 2012-10-01 NOTE — Progress Notes (Signed)
PARENTERAL NUTRITION CONSULT NOTE - FOLLOW UP  Pharmacy Consult for TPN Indication: s/p AAA repair with open abdomen; 2 month hx of poor po intake with significant weight loss  No Known Allergies  Patient Measurements: Height: 5\' 4"  (162.6 cm) Weight: 110 lb 3.7 oz (50 kg) IBW/kg (Calculated) : 59.2  Vital Signs: Temp: 99.3 F (37.4 C) (05/02 0000) Temp src: Axillary (05/02 0000) BP: 141/86 mmHg (05/02 0710) Pulse Rate: 89 (05/02 0710) Intake/Output from previous day: 05/01 0701 - 05/02 0700 In: 3348.4 [I.V.:1623.4; NG/GT:85; IV Piggyback:50; TPN:1590] Out: 3225 [Urine:3000; Emesis/NG output:225] Intake/Output from this shift: Total I/O In: 5 [NG/GT:5] Out: -   Labs:  Recent Labs  09/29/12 0340 09/30/12 0345 09/30/12 0346 10/01/12 0355  WBC 10.8* 11.7*  --  9.5  HGB 11.2* 11.2*  --  9.8*  HCT 32.6* 33.1*  --  29.1*  PLT 102* 115*  --  105*  APTT  --   --  34  --   INR 1.07  --  1.16  --      Recent Labs  09/29/12 0340 09/30/12 0345 10/01/12 0355  NA 140 138 138  K 3.4* 3.3* 3.8  CL 96 95* 98  CO2 40* 38* 36*  GLUCOSE 105* 124* 124*  BUN 30* 30* 32*  CREATININE 0.76 0.70 0.72  CALCIUM 8.6 8.7 8.2*  MG 1.9 1.9 1.9  PHOS 4.7* 4.0 4.1  PROT 6.3 6.3  --   ALBUMIN 1.8* 1.8*  --   AST 99* 62*  --   ALT 52 37  --   ALKPHOS 91 77  --   BILITOT 2.0* 1.1  --    Estimated Creatinine Clearance: 62.5 ml/min (by C-G formula based on Cr of 0.72).    Recent Labs  09/30/12 1607 09/30/12 2022 09/30/12 2323  GLUCAP 135* 80 111*    Insulin Requirements in the past 24 hours:  2 units Novolog SSI  Current Nutrition:  Clinimix E 5/15 at 38ml/hr and lipids 10 ml/hr MWF provides 90gm protein and avg 1483 Kcal daily  Vital AF 1.2 at 5 ml/hr provides 11gm protein and 144 kcal daily  Nutritional Goals:  1550-1650 kCal, 85-100 grams of protein per day  Assessment: Pt presented to [facility] with 2 month hx of poor PO intake and weight loss due to mouth pain,  abdominal pain, and low back pain. Down to 99# from usual weight of 120#. 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 Now s/p AAA repair, POD#10.   GI: s/p abd closure 4/25. NGT output 278ml/24h. Panda in place - trickle tube feeds at 35ml/hr with plans to advance to 21ml/hr today. Continues on reglan Endo: No hx DM - CBGs controlled on SSI alone with minimal insulin requirements Lytes: Lytes stable. Corrected Ca 10 Renal: No hx renal dz - Scr stable, UOP 2.5 ml/kg/hr Pulm: Hx COPD, heavy smoker - VDRF 50% Fio2 - needs PAH w/u Cards: AFib - BP 141/86, HR 58-117 - metoprolol IV scheduled Hepatobil: Hx EtOH abuse. AST decreased, ALT + alk phos WNL, Tbili WNL. Trig 77. Prealbumin 2.9 - actually trend down from 4 days ago, expect to be low with ongoing inflammation, malnutrition and post-op status Neuro: Versed drip, RASS 0 (at goal) ID: Rocephin/flagyl D#4 (total abx D#10) for salmonella bacteremia/UTI - Tmax 100, WBC down to nl, ID following - plan for 6 wks of IV Rocephin Best Practices: SCDs, mouth care, PPI IV TPN Access: R-IJ CVC TPN day#: 8  Plan:  -  Cont Clinimix E 5/15 at goal 75 ml/hr and lipids 10 ml/hr on M/W/F - Change SSI/CBG to sensitive scale q8h - may be able to d/c in a couple of days if remains at goal with TF on board - Supplement MVI, trace elements and IV fats on MWF due to ongoing national shortages. - F/u advancement of TF  Christoper Fabian, PharmD, BCPS Clinical pharmacist, pager [phone]  10/01/2012 8:15 AM

## 2012-10-02 ENCOUNTER — Inpatient Hospital Stay (HOSPITAL_COMMUNITY): Payer: Medicare Other

## 2012-10-02 LAB — CBC
HCT: 27.4 % — ABNORMAL LOW (ref 39.0–52.0)
Hemoglobin: 9.1 g/dL — ABNORMAL LOW (ref 13.0–17.0)
RBC: 3.05 MIL/uL — ABNORMAL LOW (ref 4.22–5.81)
WBC: 8.1 10*3/uL (ref 4.0–10.5)

## 2012-10-02 LAB — POCT I-STAT 3, ART BLOOD GAS (G3+)
Bicarbonate: 29.8 mEq/L — ABNORMAL HIGH (ref 20.0–24.0)
TCO2: 31 mmol/L (ref 0–100)
pCO2 arterial: 43.2 mmHg (ref 35.0–45.0)
pH, Arterial: 7.447 (ref 7.350–7.450)
pO2, Arterial: 78 mmHg — ABNORMAL LOW (ref 80.0–100.0)

## 2012-10-02 LAB — BASIC METABOLIC PANEL
BUN: 29 mg/dL — ABNORMAL HIGH (ref 6–23)
CO2: 33 mEq/L — ABNORMAL HIGH (ref 19–32)
Chloride: 99 mEq/L (ref 96–112)
GFR calc Af Amer: >90 mL/min (ref >90)
Glucose, Bld: 119 mg/dL — ABNORMAL HIGH (ref 70–99)
Potassium: 3.6 mEq/L (ref 3.5–5.1)

## 2012-10-02 LAB — GLUCOSE, CAPILLARY: Glucose-Capillary: 106 mg/dL — ABNORMAL HIGH (ref 70–99)

## 2012-10-02 MED ORDER — ACETAMINOPHEN 160 MG/5ML PO SOLN
650.0000 mg | ORAL | Status: DC | PRN
  Administered 2012-10-02: 650 mg
  Filled 2012-10-02: qty 20.3

## 2012-10-02 MED ORDER — POTASSIUM CHLORIDE 20 MEQ/15ML (10%) PO LIQD
ORAL | Status: AC
  Filled 2012-10-02: qty 30

## 2012-10-02 MED ORDER — POTASSIUM CHLORIDE 20 MEQ/15ML (10%) PO LIQD
40.0000 meq | Freq: Once | ORAL | Status: AC
  Administered 2012-10-02: 40 meq
  Filled 2012-10-02: qty 30

## 2012-10-02 MED ORDER — VITAL AF 1.2 CAL PO LIQD
1000.0000 mL | ORAL | Status: DC
  Administered 2012-10-02 – 2012-10-03 (×2): 1000 mL
  Filled 2012-10-02 (×3): qty 1000

## 2012-10-02 MED ORDER — ACETAZOLAMIDE SODIUM 500 MG IJ SOLR
500.0000 mg | Freq: Once | INTRAMUSCULAR | Status: AC
  Administered 2012-10-02: 500 mg via INTRAVENOUS
  Filled 2012-10-02: qty 500

## 2012-10-02 MED ORDER — PROPOFOL 10 MG/ML IV EMUL
INTRAVENOUS | Status: AC
  Administered 2012-10-02: 1000 mg
  Filled 2012-10-02: qty 100

## 2012-10-02 MED ORDER — FENTANYL CITRATE 0.05 MG/ML IJ SOLN
100.0000 ug | INTRAMUSCULAR | Status: DC | PRN
  Administered 2012-10-03 (×6): 100 ug via INTRAVENOUS
  Filled 2012-10-02 (×6): qty 2

## 2012-10-02 MED ORDER — CLINIMIX E/DEXTROSE (5/15) 5 % IV SOLN
INTRAVENOUS | Status: AC
  Administered 2012-10-02: 18:00:00 via INTRAVENOUS
  Filled 2012-10-02: qty 2000

## 2012-10-02 NOTE — Progress Notes (Signed)
Fever and diarrhea   Repeat cultures and C. Diff; cont current antibiotics and symptom control with Tylenol.

## 2012-10-02 NOTE — Progress Notes (Signed)
PARENTERAL NUTRITION CONSULT NOTE - FOLLOW UP  Pharmacy Consult for TPN Indication: s/p AAA repair with open abdomen; 2 month hx of poor po intake with significant weight loss  No Known Allergies  Patient Measurements: Height: 5\' 4"  (162.6 cm) Weight: 109 lb 12.6 oz (49.8 kg) IBW/kg (Calculated) : 59.2  Vital Signs: Temp: 98.6 F (37 C) (05/03 0400) Temp src: Oral (05/03 0400) BP: 99/56 mmHg (05/03 0700) Pulse Rate: 84 (05/03 0700) Intake/Output from previous day: 05/02 0701 - 05/03 0700 In: 3544.4 [I.V.:1074.1; NG/GT:485; IV Piggyback:50; TPN:1935.3] Out: 2240 [Urine:2090; Emesis/NG output:150] Intake/Output from this shift:    Labs:  Recent Labs  09/30/12 0345 09/30/12 0346 10/01/12 0355 10/02/12 0443  WBC 11.7*  --  9.5 8.1  HGB 11.2*  --  9.8* 9.1*  HCT 33.1*  --  29.1* 27.4*  PLT 115*  --  105* 117*  APTT  --  34  --   --   INR  --  1.16  --   --      Recent Labs  09/30/12 0345 10/01/12 0355 10/02/12 0443  NA 138 138 137  K 3.3* 3.8 3.6  CL 95* 98 99  CO2 38* 36* 33*  GLUCOSE 124* 124* 119*  BUN 30* 32* 29*  CREATININE 0.70 0.72 0.67  CALCIUM 8.7 8.2* 7.9*  MG 1.9 1.9  --   PHOS 4.0 4.1  --   PROT 6.3  --   --   ALBUMIN 1.8*  --   --   AST 62*  --   --   ALT 37  --   --   ALKPHOS 77  --   --   BILITOT 1.1  --   --    Estimated Creatinine Clearance: 62.3 ml/min (by C-G formula based on Cr of 0.67).    Recent Labs  10/01/12 0824 10/01/12 1641 10/01/12 2354  GLUCAP 110* 114* 105*    Insulin Requirements in the past 24 hours:  0 units Novolog SSI  Current Nutrition:  Clinimix E 5/15 at 14ml/hr and lipids 10 ml/hr MWF provides 90gm protein and avg 1483 Kcal daily  Vital AF 1.2 at 10 ml/hr provides 22gm protein and 288 kcal daily  Nutritional Goals:  1550-1650 kCal, 85-100 grams of protein per day  Assessment: Pt presented to [facility] with 2 month hx of poor PO intake and weight loss due to mouth pain, abdominal pain, and low back pain.  Down to 99# from usual weight of 120#. 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 Now s/p AAA repair, POD#10.   GI: s/p abd closure 4/25. NGT output 165ml/24h. Panda in place - trickle tube feeds at 67ml/hr. Continues on reglan Endo: No hx DM - CBGs controlled on SSI alone with minimal insulin requirements Lytes: Lytes stable. Corrected Ca 9.7 Renal: No hx renal dz - Scr stable, UOP 1.7 ml/kg/hr Pulm: Hx COPD, heavy smoker - VDRF 40% Fio2 - needs PAH w/u Cards:  BP quite variable, HR 74-116 - metoprolol IV scheduled Hepatobil: Hx EtOH abuse. AST decreased, ALT + alk phos WNL, Tbili WNL. Trig 77. Prealbumin 2.9 - actually trend down from 4 days ago, expect to be low with ongoing inflammation, malnutrition and post-op status Neuro: Versed/Fentanyl drip, RASS -1 ID: Rocephin D#5 (total abx D#11) for salmonella bacteremia/UTI - Tmax 99.3, WBC down to nl, ID following - plan for 6 wks of IV Rocephin Best Practices: SCDs, mouth care, PPI IV TPN Access: R-IJ CVC TPN day#: 9  Plan:  - Cont Clinimix E 5/15 at goal 75 ml/hr and lipids 10 ml/hr on M/W/F - Change SSI/CBG to sensitive scale q8h - may be able to d/c tomorrow if remains at goal with TF on board - Supplement MVI, trace elements and IV fats on MWF due to ongoing national shortages. - F/u advancement of TF and ability to start to wean TPN  Christoper Fabian, PharmD, BCPS Clinical pharmacist, pager [phone]  10/02/2012 7:43 AM

## 2012-10-02 NOTE — Progress Notes (Signed)
[facility] [place]  Subjective   No acute events overnight   Physical Exam:  Remains intubated and sedated.  Becomes combative occasionally Palpable right DP pulse Incisions all look good   CXR;  C/w ARDS    Assessment/Plan:  S/p ruptured AAA, Right fem pop  Continue with vent wean per CCM, appreciate their assistance GI:  Increase TF to 20 ID on Abx for salmonella  Neshawn Aird IV, V. WELLS 10/02/2012 8:05 AM --  Filed Vitals:   10/02/12 0700  BP: 99/56  Pulse: 84  Temp:   Resp: 11    Intake/Output Summary (Last 24 hours) at 10/02/12 0805 Last data filed at 10/02/12 0700  Gross per 24 hour  Intake 3382.41 ml  Output   2090 ml  Net 1292.41 ml     Laboratory CBC    Component Value Date/Time   WBC 8.1 10/02/2012 0443   HGB 9.1* 10/02/2012 0443   HCT 27.4* 10/02/2012 0443   PLT 117* 10/02/2012 0443    BMET    Component Value Date/Time   NA 137 10/02/2012 0443   K 3.6 10/02/2012 0443   CL 99 10/02/2012 0443   CO2 33* 10/02/2012 0443   GLUCOSE 119* 10/02/2012 0443   BUN 29* 10/02/2012 0443   CREATININE 0.67 10/02/2012 0443   CALCIUM 7.9* 10/02/2012 0443   GFRNONAA >90 10/02/2012 0443   GFRAA >90 10/02/2012 0443    COAG Lab Results  Component Value Date   INR 1.16 09/30/2012   INR 1.07 09/29/2012   INR 1.11 09/28/2012   No results found for this basename: PTT    Antibiotics Anti-infectives   Start     Dose/Rate Route Frequency Ordered Stop   09/28/12 1700  cefTRIAXone (ROCEPHIN) 2 g in dextrose 5 % 50 mL IVPB     2 g 100 mL/hr over 30 Minutes Intravenous Every 24 hours 09/28/12 1520     09/28/12 1600  metroNIDAZOLE (FLAGYL) tablet 500 mg  Status:  Discontinued     500 mg Oral 3 times per day 09/28/12 1520 10/01/12 1145   09/22/12 1500  ceFAZolin (ANCEF) IVPB 1 g/50 mL premix  Status:  Discontinued     1 g 100 mL/hr over 30 Minutes Intravenous Every 8 hours 09/22/12 0842 09/22/12 0917   09/22/12 1000  piperacillin-tazobactam (ZOSYN) IVPB 3.375 g   Status:  Discontinued     3.375 g 12.5 mL/hr over 240 Minutes Intravenous 3 times per day 09/22/12 0932 09/28/12 1520   09/21/12 2345  [MAR Hold]  ceFAZolin (ANCEF) IVPB 2 g/50 mL premix  Status:  Discontinued     (On MAR Hold since 09/22/12 0004)  Comments:  Send with pt to OR   2 g 100 mL/hr over 30 Minutes Intravenous On call 09/21/12 2343 09/22/12 0842   09/21/12 1400  [MAR Hold]  cefTRIAXone (ROCEPHIN) 1 g in dextrose 5 % 50 mL IVPB  Status:  Discontinued     (On MAR Hold since 09/22/12 0004)   1 g 100 mL/hr over 30 Minutes Intravenous Every 24 hours 09/21/12 1257 09/22/12 0842       V. Charlena Cross, M.D. [facility] Office: [phone] Pager:  [phone]

## 2012-10-02 NOTE — Progress Notes (Signed)
PULMONARY  / CRITICAL CARE MEDICINE  Name: Charles [name] MRN: [id number] DOB: November 03, 1945    ADMISSION DATE:  09/21/2012 CONSULTATION DATE:  09/22/12  REFERRING MD :  Vascular PRIMARY SERVICE: Vascular  CHIEF COMPLAINT:  Post op respiratory failure  BRIEF PATIENT DESCRIPTION: 67 yo with h/o ETOH transferred for [facility] on 4/22 with ruptured AAA requiring surgical repair.  Post op course complicated by VDRF, ARDS and Salmonella bacteremia and the need for R aorto-femoral bypass.   SIGNIFICANT EVENTS:  4/23 - OR for AAA repair, Aorto left common iliac right common femoral bypass,aortic endarterectomy, right femoral endarterectomy profundaplasty, placement abdominal VAC 4/24 - Off pressors, tolerating pressure support, TNA started 5/01 - R aorto-femoral bypass  STUDIES: 4/23 CT abd/pelvis>>> rupturing AAA  LINES / TUBES: ETT 4/23>>> R IJ PA cath 4/23>>> 4/24 R radial artery 4/23>>> 5/30 Rt IJ CVL 4/24 >>  CULTURES: blooc cultures x 2 4/23 >>> Salmonella  Urine 4/23>>> GNR >>> Salmonella Blood 4/29 >>    ANTIBIOTICS: Ancef 4/22>>>4/23 Zosyn 4/23>>> 4/29 Ceftriaxone 4/29 >>  Flagyl IV 4/29 >> 5/2  SUBJECTIVE / INTERVAL EVENTS:  Remains intubated on vent, no distress.  Sedate.   VITAL SIGNS: Temp:  [98.2 F (36.8 C)-99.9 F (37.7 C)] 99.9 F (37.7 C) (05/03 0815) Pulse Rate:  [74-116] 97 (05/03 1100) Resp:  [9-26] 15 (05/03 1100) BP: (96-164)/(48-89) 134/76 mmHg (05/03 1100) SpO2:  [96 %-100 %] 96 % (05/02 2000) FiO2 (%):  [39.6 %-50 %] 40 % (05/03 1100) Weight:  [109 lb 12.6 oz (49.8 kg)] 109 lb 12.6 oz (49.8 kg) (05/03 0456)  HEMODYNAMICS:    VENTILATOR SETTINGS: Vent Mode:  [-] PRVC FiO2 (%):  [39.6 %-50 %] 40 % Set Rate:  [10 bmp] 10 bmp Vt Set:  [600 mL] 600 mL PEEP:  [5 cmH20] 5 cmH20 Plateau Pressure:  [14 cmH20-22 cmH20] 15 cmH20  INTAKE / OUTPUT: Intake/Output     05/02 0701 - 05/03 0700 05/03 0701 - 05/04 0700   I.V. (mL/kg) 1074.1 (21.6) 161.5 (3.2)    NG/GT 485 140   IV Piggyback 50    TPN 1935.3 340   Total Intake(mL/kg) 3544.4 (71.2) 641.5 (12.9)   Urine (mL/kg/hr) 2090 (1.7) 240 (1)   Emesis/NG output 150 (0.1) 50 (0.2)   Total Output 2240 290   Net +1304.4 +351.5        Stool Occurrence 3 x      PHYSICAL EXAMINATION: General: No distress Neuro: Sedated, awakens easily HEENT: ETT in place Cardiovascular: regular Lungs: prolonged exhalation, no wheeze Abdomen: distended, midline incision c/d/i, staples in place Ext: no edema  LABS:  Recent Labs Lab 09/25/12 1644 09/25/12 2336 09/26/12 0315 09/28/12 0402 09/29/12 0443 10/02/12 0621  PHART 7.384  --  7.460* 7.458* 7.512* 7.447  PCO2ART 38.6  --  38.6 50.6* 44.4 43.2  PO2ART 60.0*  --  64.5* 90.0 65.6* 78.0*  HCO3 23.0  --  27.1* 35.6* 35.4* 29.8*  TCO2 24 27 28.3 37 36.7 31  O2SAT 90.0  --  92.8 97.0 94.5 96.0    Recent Labs Lab 09/27/12 0315 09/28/12 0430 09/29/12 0340 09/30/12 0345 10/01/12 0355 10/02/12 0443  NA 138 138 140 138 138 137  K 3.4* 3.8 3.4* 3.3* 3.8 3.6  CL 99 97 96 95* 98 99  CO2 33* 34* 40* 38* 36* 33*  GLUCOSE 134* 129* 105* 124* 124* 119*  BUN 25* 29* 30* 30* 32* 29*  CREATININE 0.69 0.77 0.76 0.70 0.72 0.67  CALCIUM  8.1* 8.4 8.6 8.7 8.2* 7.9*  MG 1.9 2.0 1.9 1.9 1.9  --   PHOS 4.4 4.4 4.7* 4.0 4.1  --     Recent Labs Lab 09/30/12 0345 10/01/12 0355 10/02/12 0443  HGB 11.2* 9.8* 9.1*  HCT 33.1* 29.1* 27.4*  WBC 11.7* 9.5 8.1  PLT 115* 105* 117*    Recent Labs Lab 09/27/12 0315 09/28/12 0430 09/29/12 0340 09/30/12 0345 09/30/12 0346  AST 83*  --  99* 62*  --   ALT 32  --  52 37  --   ALKPHOS 62  --  91 77  --   BILITOT 3.5*  --  2.0* 1.1  --   PROT 5.2*  --  6.3 6.3  --   ALBUMIN 1.6*  --  1.8* 1.8*  --   INR  --  1.11 1.07  --  1.16    Recent Labs Lab 10/01/12 0510 10/01/12 0824 10/01/12 1641 10/01/12 2354 10/02/12 0813  GLUCAP 106* 110* 114* 105* 121*   CXR: 5/3- diffuse interstitial edema  TTE  09/25/12 >> Left ventricle: The cavity size was normal. Wall thickness was normal. Systolic function was moderately reduced. EF 35% to40%. Diffuse hypokinesis. Aortic valve: Mild to moderate regurgitation. Mitral valve: Calcified annulus. Mildly thickened leaflets. Moderate regurgitation. Left atrium: The atrium was mildly dilated. Right atrium: The atrium was mildly dilated. Pulmonary arteries: Systolic pressure was severely increased. PA peak pressure: 71mm Hg (S).  ASSESSMENT / PLAN:  PULMONARY A: Acute (on chronic) respiratory failure.  Hx of smoking and presumed COPD. Pulmonary edema, less likely ARDS.  Pulmonary hypertension. P:   Goal SpO2>92, pH>7.30 Full mechanical support SBT in AM with intent to extubate Bronchodilators  CARDIOVASCULAR A: Ruptured AAA s/p repair. S/p R aorto-femoral bypass. HTN. PVD. P:  Per Vascular Surgery Hydralazine, Lopressor, Labetalol PRN  RENAL A:  Hypokalemia.  Possible contraction alkalosis. P:   Diamox x 1 IVF@KVO  Trend BMP  GASTROINTESTINAL A:  Severe protein calorie malnutrition. Hyperbilirubinemia. P:   TNA Trend LFT Protonix for GI Px  HEMATOLOGIC A: Acute blood loss anemia. Thrombocytopenia. P:  Trend CBC  INFECTIOUS A:  Salmonella UTI and bacteremia. P:   ID following Antibiotics / cultures as above  ENDOCRINE A:  Hyperglycemia. P:   SSI   NEUROLOGIC A:  Acute encephalopathy. P:   WUA in AM D/c Fentanyl / Versed gtt Fentanyl PRN for pain Start Propofol  Canary Brim, NP-C [facility] Pgr: [phone] or [phone]  I have personally obtained a history, examined the patient, evaluated laboratory and imaging results, formulated the assessment and plan and placed orders.  CRITICAL CARE:  The patient is critically ill with multiple organ systems failure and requires high complexity decision making for assessment and support, frequent evaluation and titration of therapies, application of advanced  monitoring technologies and extensive interpretation of multiple databases. Critical Care Time devoted to patient care services described in this note is 40 minutes.   Lonia Farber, MD Pulmonary and Critical Care Medicine [facility] Pager: [phone]  10/02/2012, 3:11 PM

## 2012-10-03 ENCOUNTER — Inpatient Hospital Stay (HOSPITAL_COMMUNITY): Payer: Medicare Other

## 2012-10-03 LAB — BASIC METABOLIC PANEL
CO2: 25 mEq/L (ref 19–32)
Calcium: 8 mg/dL — ABNORMAL LOW (ref 8.4–10.5)
Chloride: 103 mEq/L (ref 96–112)
Potassium: 3.8 mEq/L (ref 3.5–5.1)
Sodium: 134 mEq/L — ABNORMAL LOW (ref 135–145)

## 2012-10-03 LAB — MAGNESIUM: Magnesium: 2 mg/dL (ref 1.5–2.5)

## 2012-10-03 LAB — POCT I-STAT 3, ART BLOOD GAS (G3+)
Patient temperature: 100.4
TCO2: 23 mmol/L (ref 0–100)
pH, Arterial: 7.389 (ref 7.350–7.450)

## 2012-10-03 LAB — CLOSTRIDIUM DIFFICILE BY PCR: Toxigenic C. Difficile by PCR: NEGATIVE

## 2012-10-03 LAB — CBC
HCT: 28 % — ABNORMAL LOW (ref 39.0–52.0)
RBC: 3.13 MIL/uL — ABNORMAL LOW (ref 4.22–5.81)
RDW: 14.6 % (ref 11.5–15.5)
WBC: 10.8 10*3/uL — ABNORMAL HIGH (ref 4.0–10.5)

## 2012-10-03 MED ORDER — PROPOFOL 10 MG/ML IV EMUL
INTRAVENOUS | Status: AC
  Filled 2012-10-03: qty 100

## 2012-10-03 MED ORDER — SODIUM CHLORIDE 0.9 % IV SOLN
25.0000 ug/h | INTRAVENOUS | Status: DC
  Administered 2012-10-03: 50 ug/h via INTRAVENOUS
  Filled 2012-10-03: qty 50

## 2012-10-03 MED ORDER — PROPOFOL 10 MG/ML IV EMUL: 5.0000 ug/kg/min | INTRAVENOUS | Status: DC

## 2012-10-03 MED ORDER — VITAL AF 1.2 CAL PO LIQD
1000.0000 mL | ORAL | Status: DC
  Filled 2012-10-03 (×3): qty 1000

## 2012-10-03 MED ORDER — PANTOPRAZOLE SODIUM 40 MG PO PACK
40.0000 mg | PACK | Freq: Every day | ORAL | Status: DC
  Administered 2012-10-03 – 2012-10-04 (×2): 40 mg
  Filled 2012-10-03 (×3): qty 20

## 2012-10-03 MED ORDER — DEXMEDETOMIDINE HCL IN NACL 200 MCG/50ML IV SOLN
0.2000 ug/kg/h | INTRAVENOUS | Status: DC
  Administered 2012-10-03: 0.4 ug/kg/h via INTRAVENOUS
  Administered 2012-10-03: 0.9 ug/kg/h via INTRAVENOUS
  Administered 2012-10-03: 0.7 ug/kg/h via INTRAVENOUS
  Administered 2012-10-03 – 2012-10-04 (×4): 1.2 ug/kg/h via INTRAVENOUS
  Filled 2012-10-03: qty 100
  Filled 2012-10-03 (×5): qty 50

## 2012-10-03 MED ORDER — PROPOFOL 10 MG/ML IV EMUL
INTRAVENOUS | Status: AC
  Filled 2012-10-03: qty 50

## 2012-10-03 MED ORDER — FENTANYL BOLUS VIA INFUSION
25.0000 ug | Freq: Four times a day (QID) | INTRAVENOUS | Status: DC | PRN
  Filled 2012-10-03: qty 100

## 2012-10-03 MED ORDER — CLINIMIX E/DEXTROSE (5/15) 5 % IV SOLN
INTRAVENOUS | Status: AC
  Administered 2012-10-03: 18:00:00 via INTRAVENOUS
  Filled 2012-10-03: qty 2000

## 2012-10-03 NOTE — Progress Notes (Signed)
PULMONARY  / CRITICAL CARE MEDICINE  Name: Charles [name] MRN: [id number] DOB: 14-May-1946    ADMISSION DATE:  09/21/2012 CONSULTATION DATE:  09/22/12  REFERRING MD :  Vascular PRIMARY SERVICE: Vascular  CHIEF COMPLAINT:  Post op respiratory failure  BRIEF PATIENT DESCRIPTION: 67 yo with h/o ETOH transferred for [facility] on 4/22 with ruptured AAA requiring surgical repair.  Post op course complicated by VDRF, ARDS and Salmonella bacteremia and the need for R aorto-femoral bypass.   SIGNIFICANT EVENTS:  4/23 - OR for AAA repair, Aorto left common iliac right common femoral bypass,aortic endarterectomy, right femoral endarterectomy profundaplasty, placement abdominal VAC 4/24 - Off pressors, tolerating pressure support, TNA started 5/01 - R aorto-femoral bypass  STUDIES: 4/23 CT abd/pelvis>>> rupturing AAA  LINES / TUBES: ETT 4/23>>> R IJ PA cath 4/23>>> 4/24 R radial artery 4/23>>> 5/30 Rt IJ CVL 4/24 >>  CULTURES: blooc cultures x 2 4/23 >>> Salmonella  Urine 4/23>>> GNR >>> Salmonella Blood 4/29 >>    ANTIBIOTICS: Ancef 4/22>>>4/23 Zosyn 4/23>>> 4/29 Ceftriaxone 4/29 >>  Flagyl IV 4/29 >> 5/2  SUBJECTIVE / INTERVAL EVENTS:  RN reports fever overnight, diarrhea.  Tachypnea on vent.  No follow commands on WUA.   VITAL SIGNS: Temp:  [99.2 F (37.3 C)-102.8 F (39.3 C)] 100.4 F (38 C) (05/04 0836) Pulse Rate:  [51-119] 119 (05/04 0909) Resp:  [12-32] 26 (05/04 0909) BP: (90-171)/(46-88) 108/70 mmHg (05/04 0909) SpO2:  [99 %-100 %] 100 % (05/04 0909) FiO2 (%):  [39.4 %-40.4 %] 40 % (05/04 0909) Weight:  [116 lb 6.5 oz (52.8 kg)] 116 lb 6.5 oz (52.8 kg) (05/04 0500)  HEMODYNAMICS:    VENTILATOR SETTINGS: Vent Mode:  [-] PRVC FiO2 (%):  [39.4 %-40.4 %] 40 % Set Rate:  [10 bmp] 10 bmp Vt Set:  [600 mL] 600 mL PEEP:  [5 cmH20] 5 cmH20 Pressure Support:  [5 cmH20] 5 cmH20 Plateau Pressure:  [16 cmH20-17 cmH20] 16 cmH20  INTAKE / OUTPUT: Intake/Output     05/03 0701  - 05/04 0700 05/04 0701 - 05/05 0700   I.V. (mL/kg) 885.2 (16.8) 40 (0.8)   Other 120    NG/GT 600 70   IV Piggyback 50    TPN 1920 150   Total Intake(mL/kg) 3575.2 (67.7) 260 (4.9)   Urine (mL/kg/hr) 2265 (1.8) 350 (2.1)   Emesis/NG output 190 (0.1)    Stool 4 (0) 200 (1.2)   Total Output 2459 550   Net +1116.2 -290        Stool Occurrence 2 x      PHYSICAL EXAMINATION: General: chronically ill adult male Neuro: Sedated HEENT: ETT in place Cardiovascular: regular, periods of PAF Lungs: prolonged exhalation, no wheeze Abdomen: distended, midline incision c/d/i, staples in place Ext: no edema  LABS:  Recent Labs Lab 09/28/12 0402 09/29/12 0443 10/02/12 0621  PHART 7.458* 7.512* 7.447  PCO2ART 50.6* 44.4 43.2  PO2ART 90.0 65.6* 78.0*  HCO3 35.6* 35.4* 29.8*  TCO2 37 36.7 31  O2SAT 97.0 94.5 96.0    Recent Labs Lab 09/28/12 0430 09/29/12 0340 09/30/12 0345 10/01/12 0355 10/02/12 0443 10/03/12 0335  NA 138 140 138 138 137 134*  K 3.8 3.4* 3.3* 3.8 3.6 3.8  CL 97 96 95* 98 99 103  CO2 34* 40* 38* 36* 33* 25  GLUCOSE 129* 105* 124* 124* 119* 121*  BUN 29* 30* 30* 32* 29* 28*  CREATININE 0.77 0.76 0.70 0.72 0.67 0.74  CALCIUM 8.4 8.6 8.7 8.2* 7.9* 8.0*  MG 2.0 1.9 1.9 1.9  --  2.0  PHOS 4.4 4.7* 4.0 4.1  --  4.3    Recent Labs Lab 10/01/12 0355 10/02/12 0443 10/03/12 0335  HGB 9.8* 9.1* 9.3*  HCT 29.1* 27.4* 28.0*  WBC 9.5 8.1 10.8*  PLT 105* 117* 140*    Recent Labs Lab 09/27/12 0315 09/28/12 0430 09/29/12 0340 09/30/12 0345 09/30/12 0346  AST 83*  --  99* 62*  --   ALT 32  --  52 37  --   ALKPHOS 62  --  91 77  --   BILITOT 3.5*  --  2.0* 1.1  --   PROT 5.2*  --  6.3 6.3  --   ALBUMIN 1.6*  --  1.8* 1.8*  --   INR  --  1.11 1.07  --  1.16    Recent Labs Lab 10/01/12 2354 10/02/12 0813 10/02/12 1618 10/03/12 0037 10/03/12 0833  GLUCAP 105* 121* 106* 135* 117*   CXR: 5/3- diffuse interstitial edema  TTE 09/25/12 >> Left  ventricle: The cavity size was normal. Wall thickness was normal. Systolic function was moderately reduced. EF 35% to40%. Diffuse hypokinesis. Aortic valve: Mild to moderate regurgitation. Mitral valve: Calcified annulus. Mildly thickened leaflets. Moderate regurgitation. Left atrium: The atrium was mildly dilated. Right atrium: The atrium was mildly dilated. Pulmonary arteries: Systolic pressure was severely increased. PA peak pressure: 71mm Hg (S).  ASSESSMENT / PLAN:  PULMONARY A: Acute (on chronic) respiratory failure.  Hx of smoking and presumed COPD. Pulmonary edema, less likely ARDS.  Pulmonary hypertension. P:   Goal SpO2>92, pH>7.30 Full mechanical support, will attempt SBT after agitation / delirium controlled Bronchodilators Daily assessment for extubation  CARDIOVASCULAR A: Ruptured AAA s/p repair. S/p R aorto-femoral bypass. HTN. PVD. P:  Per Vascular Surgery Hydralazine, Lopressor, Labetalol PRN  RENAL A:  Hypokalemia.  Possible contraction alkalosis. P:   IVF@KVO  Trend BMP  GASTROINTESTINAL A:  Severe protein calorie malnutrition. Hyperbilirubinemia.  Diarrhea. P:   TNA Trend LFT Protonix for GI Px D/C reglan in setting of diarrhea Increase TF (ok with VVS) to goal of 55  HEMATOLOGIC A: Acute blood loss anemia. Thrombocytopenia. P:  Trend CBC  INFECTIOUS A:  Salmonella UTI and bacteremia. P:   ID following Antibiotics / cultures as above  ENDOCRINE A:  Hyperglycemia. P:   SSI   NEUROLOGIC A:  Acute encephalopathy, appears to be the limiting factor for extubation. P:   WUA in AM Fentanyl PRN for pain D/c Propofol Start Precedex  Canary Brim, NP-C [place] [facility] Pgr: [phone] or [phone]  I have personally obtained a history, examined the patient, evaluated laboratory and imaging results, formulated the assessment and plan and placed orders.  CRITICAL CARE:  The patient is critically ill with multiple organ systems  failure and requires high complexity decision making for assessment and support, frequent evaluation and titration of therapies, application of advanced monitoring technologies and extensive interpretation of multiple databases. Critical Care Time devoted to patient care services described in this note is 40 minutes.    10/03/2012, 10:09 AM

## 2012-10-03 NOTE — Significant Event (Signed)
Per verbal order of CCM Brandi, NP, to increase TF and to let pharmacy aware of the TF increase so that they could adjust TNA if needed.

## 2012-10-03 NOTE — Progress Notes (Addendum)
PARENTERAL NUTRITION CONSULT NOTE - FOLLOW UP  Pharmacy Consult for TPN Indication: s/p AAA repair with open abdomen; 2 month hx of poor po intake with significant weight loss  No Known Allergies  Patient Measurements: Height: 5\' 4"  (162.6 cm) Weight: 116 lb 6.5 oz (52.8 kg) IBW/kg (Calculated) : 59.2  Vital Signs: Temp: 99.3 F (37.4 C) (05/04 0400) Temp src: Oral (05/04 0400) BP: 92/46 mmHg (05/04 0700) Pulse Rate: 87 (05/04 0700) Intake/Output from previous day: 05/03 0701 - 05/04 0700 In: 3575.2 [I.V.:885.2; NG/GT:600; IV Piggyback:50; TPN:1920] Out: 2459 [Urine:2265; Emesis/NG output:190; Stool:4] Intake/Output from this shift:    Labs:  Recent Labs  10/01/12 0355 10/02/12 0443 10/03/12 0335  WBC 9.5 8.1 10.8*  HGB 9.8* 9.1* 9.3*  HCT 29.1* 27.4* 28.0*  PLT 105* 117* 140*     Recent Labs  10/01/12 0355 10/02/12 0443 10/03/12 0335  NA 138 137 134*  K 3.8 3.6 3.8  CL 98 99 103  CO2 36* 33* 25  GLUCOSE 124* 119* 121*  BUN 32* 29* 28*  CREATININE 0.72 0.67 0.74  CALCIUM 8.2* 7.9* 8.0*  MG 1.9  --  2.0  PHOS 4.1  --  4.3   Estimated Creatinine Clearance: 66 ml/min (by C-G formula based on Cr of 0.74).    Recent Labs  10/02/12 0813 10/02/12 1618 10/03/12 0037  GLUCAP 121* 106* 135*    Insulin Requirements in the past 24 hours:  0 units Novolog SSI  Current Nutrition:  Clinimix E 5/15 at 52ml/hr and lipids 10 ml/hr MWF provides 90gm protein and avg 1483 Kcal daily  Vital AF 1.2 at 20 ml/hr provides 44gm protein and 576 kcal daily  Nutritional Goals:  1550-1650 kCal, 85-100 grams of protein per day  Assessment: Pt presented to [facility] with 2 month hx of poor PO intake and weight loss due to mouth pain, abdominal pain, and low back pain. Down to 99# from usual weight of 120#. Found to have ruptured AAA, AFib and transferred to [facility]. Now s/p AAA repair on 4/22.   GI: s/p abd closure 4/25. NGT output 134ml/24h. Panda in place - tube feeds at  47ml/hr. Continues on reglan. Pt now with diarrhea. Cdiff pending. Endo: No hx DM - CBGs controlled on SSI alone with minimal insulin requirements Lytes: Lytes stable. Corrected Ca 9.8 Renal: No hx renal dz - Scr stable, UOP 1.8 ml/kg/hr. I/O +1L yesterday Pulm: Hx COPD, heavy smoker - VDRF 40% Fio2 - needs PAH w/u. Plan to try to extubate today Cards:  New post-op afib. BP quite variable, HR 70-115 - metoprolol IV scheduled Hepatobil: Hx EtOH abuse. AST decreased, ALT + alk phos WNL, Tbili WNL. Trig 77. Prealbumin 2.9 - actually trend down, expect to be low with ongoing inflammation, malnutrition and post-op status - hope for trend upward with next check Neuro: prn Fentanyl, RASS -1 (at goal) ID: Rocephin D#6 (total abx D#11) for salmonella bacteremia/UTI - Tmax 102.8, WBC slightly elevated - trending up again, ID following - plan for 6 wks of IV Rocephin. Cdiff pending. Best Practices: SCDs, mouth care, PPI IV TPN Access: R-IJ CVC TPN day#: 10  Plan:  - Decrease Clinimix E 5/15 to 55 ml/hr and lipids 10 ml/hr on M/W/F. TF and TPN will meet pt's estimated needs - D/C SSI/CBGs as CBGs have been at goal for multiple days on TPN + TF. - Supplement MVI, trace elements and IV fats on MWF due to ongoing Publishing copy. - Change protonix to per tube -  F/u advancement of TF and ability to wean/d/c TPN - F/u TPN labs in a.m.  Christoper Fabian, PharmD, BCPS Clinical pharmacist, pager [phone]  10/03/2012 7:20 AM  Addendum 5/4 1100  RN called to let me know that plan is to increase TF (Vital AF 1.2) to goal rate of 55 ml/hr. Should be at 72ml/hr ~1200 and 82ml/hr hour by the end of today. Plan 1) Will change TPN to 40 ml/hr tonight when new bag hangs. If pt tolerates TF at >75% goal rate, should be able to d/c TPN tomorrow. 2) F/u TF tolerance  Christoper Fabian, PharmD, BCPS Clinical pharmacist, pager [phone] 10/03/2012   10:59 AM

## 2012-10-03 NOTE — Progress Notes (Signed)
Agitation on Precedex drip and Fentanyl intermittent  Fentanyl drip ordered

## 2012-10-03 NOTE — Progress Notes (Signed)
[facility] [place]  Subjective  -   Diarrhea overnight.  cdiff pending   Physical Exam:  Resting comfortably Incisions clean and intact Palpable right pedal pulse       Assessment/Plan:    Possible extubation today F/u cdiff Increase TF if does not extubate Continue ABX for salmonella  BRABHAM IV, V. WELLS 10/03/2012 7:36 AM --  Filed Vitals:   10/03/12 0700  BP: 92/46  Pulse: 87  Temp:   Resp: 23    Intake/Output Summary (Last 24 hours) at 10/03/12 0736 Last data filed at 10/03/12 0700  Gross per 24 hour  Intake 3575.17 ml  Output   2459 ml  Net 1116.17 ml     Laboratory CBC    Component Value Date/Time   WBC 10.8* 10/03/2012 0335   HGB 9.3* 10/03/2012 0335   HCT 28.0* 10/03/2012 0335   PLT 140* 10/03/2012 0335    BMET    Component Value Date/Time   NA 134* 10/03/2012 0335   K 3.8 10/03/2012 0335   CL 103 10/03/2012 0335   CO2 25 10/03/2012 0335   GLUCOSE 121* 10/03/2012 0335   BUN 28* 10/03/2012 0335   CREATININE 0.74 10/03/2012 0335   CALCIUM 8.0* 10/03/2012 0335   GFRNONAA >90 10/03/2012 0335   GFRAA >90 10/03/2012 0335    COAG Lab Results  Component Value Date   INR 1.16 09/30/2012   INR 1.07 09/29/2012   INR 1.11 09/28/2012   No results found for this basename: PTT    Antibiotics Anti-infectives   Start     Dose/Rate Route Frequency Ordered Stop   09/28/12 1700  cefTRIAXone (ROCEPHIN) 2 g in dextrose 5 % 50 mL IVPB     2 g 100 mL/hr over 30 Minutes Intravenous Every 24 hours 09/28/12 1520     09/28/12 1600  metroNIDAZOLE (FLAGYL) tablet 500 mg  Status:  Discontinued     500 mg Oral 3 times per day 09/28/12 1520 10/01/12 1145   09/22/12 1500  ceFAZolin (ANCEF) IVPB 1 g/50 mL premix  Status:  Discontinued     1 g 100 mL/hr over 30 Minutes Intravenous Every 8 hours 09/22/12 0842 09/22/12 0917   09/22/12 1000  piperacillin-tazobactam (ZOSYN) IVPB 3.375 g  Status:  Discontinued     3.375 g 12.5 mL/hr over 240 Minutes Intravenous 3  times per day 09/22/12 0932 09/28/12 1520   09/21/12 2345  [MAR Hold]  ceFAZolin (ANCEF) IVPB 2 g/50 mL premix  Status:  Discontinued     (On MAR Hold since 09/22/12 0004)  Comments:  Send with pt to OR   2 g 100 mL/hr over 30 Minutes Intravenous On call 09/21/12 2343 09/22/12 0842   09/21/12 1400  [MAR Hold]  cefTRIAXone (ROCEPHIN) 1 g in dextrose 5 % 50 mL IVPB  Status:  Discontinued     (On MAR Hold since 09/22/12 0004)   1 g 100 mL/hr over 30 Minutes Intravenous Every 24 hours 09/21/12 1257 09/22/12 0842       V. Charlena Cross, M.D. [facility] [place] Office: [phone] Pager:  [phone]

## 2012-10-03 NOTE — Progress Notes (Signed)
eLink Physician-Brief Progress Note Patient Name: Charles [name] DOB: 06-29-1945 MRN: [id number]  Date of Service  10/03/2012   HPI/Events of Note  Ongoing diarrhea - CDif has been sent   eICU Interventions  Order for rectal tube to prevent skin breakdown.   Intervention Category Minor Interventions: Routine modifications to care plan (e.g. PRN medications for pain, fever)  Gloriann Riede 10/03/2012, 1:11 AM

## 2012-10-04 ENCOUNTER — Inpatient Hospital Stay (HOSPITAL_COMMUNITY): Payer: Medicare Other

## 2012-10-04 ENCOUNTER — Encounter (HOSPITAL_COMMUNITY): Payer: Self-pay | Admitting: Vascular Surgery

## 2012-10-04 DIAGNOSIS — R410 Disorientation, unspecified: Secondary | ICD-10-CM

## 2012-10-04 DIAGNOSIS — R404 Transient alteration of awareness: Secondary | ICD-10-CM

## 2012-10-04 LAB — CBC
MCH: 29.8 pg (ref 26.0–34.0)
MCV: 88.1 fL (ref 78.0–100.0)
Platelets: 153 10*3/uL (ref 150–400)
RDW: 14.6 % (ref 11.5–15.5)
WBC: 8.1 10*3/uL (ref 4.0–10.5)

## 2012-10-04 LAB — DIFFERENTIAL
Basophils Absolute: 0 10*3/uL (ref 0.0–0.1)
Eosinophils Absolute: 0 10*3/uL (ref 0.0–0.7)
Eosinophils Relative: 0 % (ref 0–5)
Neutrophils Relative %: 74 % (ref 43–77)

## 2012-10-04 LAB — PREALBUMIN: Prealbumin: 4.8 mg/dL — ABNORMAL LOW (ref 17.0–34.0)

## 2012-10-04 LAB — COMPREHENSIVE METABOLIC PANEL
ALT: 20 U/L (ref 0–53)
AST: 41 U/L — ABNORMAL HIGH (ref 0–37)
Calcium: 7.8 mg/dL — ABNORMAL LOW (ref 8.4–10.5)
Creatinine, Ser: 0.63 mg/dL (ref 0.50–1.35)
GFR calc Af Amer: >90 mL/min (ref >90)
GFR calc non Af Amer: >90 mL/min (ref >90)
Glucose, Bld: 158 mg/dL — ABNORMAL HIGH (ref 70–99)
Potassium: 3.8 mEq/L (ref 3.5–5.1)
Sodium: 134 mEq/L — ABNORMAL LOW (ref 135–145)
Total Bilirubin: 0.5 mg/dL (ref 0.3–1.2)

## 2012-10-04 LAB — TRIGLYCERIDES: Triglycerides: 71 mg/dL (ref <150)

## 2012-10-04 MED ORDER — METOPROLOL TARTRATE 25 MG/10 ML ORAL SUSPENSION
25.0000 mg | Freq: Two times a day (BID) | ORAL | Status: DC
  Administered 2012-10-04 (×2): 25 mg
  Filled 2012-10-04 (×4): qty 10

## 2012-10-04 MED ORDER — FENTANYL CITRATE 0.05 MG/ML IJ SOLN
12.5000 ug | INTRAMUSCULAR | Status: DC | PRN
  Administered 2012-10-04 – 2012-10-05 (×4): 25 ug via INTRAVENOUS
  Filled 2012-10-04 (×2): qty 2

## 2012-10-04 NOTE — Progress Notes (Signed)
50mL IV Fentanyl and 40mL IV Precedex wasted in sink and flushed.  Witnessed by Larina Bras, RN

## 2012-10-04 NOTE — Progress Notes (Addendum)
NUTRITION FOLLOW UP  Intervention:    Continue advancing Vital AF 1.2 formula by 10 ml every 12 hours to recommended goal rate of 55 ml/hr to provide 1584 total kcals, 99 gm protein, 1071 ml of free water  RD to follow for nutrition care plan  New Nutrition Dx:   Increased nutrient needs related to post-op state as evidenced by estimated nutrition needs, ongoing  New Goal:   EN to meet >/= 90% of estimated nutrition needs, progressing  Monitor:   EN regimen & tolerance, PO intake, weight, labs, I/O's  Assessment:   Patient s/p procedures 4/22:  AORTO LEFT COMMON ILIAC RIGHT COMMON FEMORAL BYPASS  AORTIC ENDARTERECTOMY  RIGHT FEMORAL ENDARTERECTOMY PROFUNDAPLASTY  PLACEMENT OF ABDOMINAL WOUND VAC  S/p closure of abdomen 4/25.  Extubated this AM.  Started on clear liquid diet.  Vital AF 1.2 formula currently infusing at 50 ml/hr via post-pyloric feeding tube providing 1440 kcals, 90 gm protein, 973 ml of free water.  Pharmacy note reviewed, TPN to be discontinued after current bag.  Height: Ht Readings from Last 1 Encounters:  09/21/12 5\' 4"  (1.626 m)    Weight Status:   Wt Readings from Last 1 Encounters:  10/04/12 113 lb 15.7 oz (51.7 kg)    Re-estimated needs:  Kcal: 1500-1700 Protein: 85-100 gm Fluid: per MD  Skin: abdominal wound VAC  Diet Order: Clear Liquid   Intake/Output Summary (Last 24 hours) at 10/04/12 1459 Last data filed at 10/04/12 1400  Gross per 24 hour  Intake 3428.11 ml  Output   2130 ml  Net 1298.11 ml    Labs:   Recent Labs Lab 09/30/12 0345 10/01/12 0355 10/02/12 0443 10/03/12 0335 10/04/12 0410  NA 138 138 137 134* 134*  K 3.3* 3.8 3.6 3.8 3.8  CL 95* 98 99 103 103  CO2 38* 36* 33* 25 23  BUN 30* 32* 29* 28* 27*  CREATININE 0.70 0.72 0.67 0.74 0.63  CALCIUM 8.7 8.2* 7.9* 8.0* 7.8*  MG 1.9 1.9  --  2.0 1.9  PHOS 4.0 4.1  --  4.3  --   GLUCOSE 124* 124* 119* 121* 158*    CBG (last 3)   Recent Labs  10/03/12 0037  10/03/12 0833 10/04/12 1215  GLUCAP 135* 117* 117*    Scheduled Meds: . ipratropium  0.5 mg Nebulization Q6H   And  . albuterol  2.5 mg Nebulization Q6H  . antiseptic oral rinse  15 mL Mouth Rinse QID  . cefTRIAXone (ROCEPHIN)  IV  2 g Intravenous Q24H  . chlorhexidine  15 mL Mouth Rinse BID  . enoxaparin (LOVENOX) injection  40 mg Subcutaneous Q24H  . feeding supplement (VITAL AF 1.2 CAL)  1,000 mL Per Tube Q24H  . metoprolol tartrate  25 mg Per Tube BID  . pantoprazole sodium  40 mg Per Tube Daily  . sodium chloride  10-40 mL Intracatheter Q12H    Continuous Infusions: . Marland KitchenTPN (CLINIMIX-E) Adult 40 mL/hr at 10/04/12 [address], [place], [place] Pager #: [phone] After-Hours Pager #: [phone]

## 2012-10-04 NOTE — Consult Note (Signed)
Physical Medicine and Rehabilitation Consult  Reason for Consult: Deconditioning Referring Physician:  Dr. Darrick Penna.    HPI: Charles [name] is a 67 y.o. male with two month history of abdominal pain, low back pain with LLE weakness with problems walking and difficulty with oral intake. He was admitted via [facility] on 09/22/12 for treatment of ruptured AAA with hemorrhage extending into left iliopsoas muscle. He was taken to OR emergently for AAA repair with left common iliac to right common femoral bypass, aortic endarterectomy, right femoral endarterectomy profundaplasty, placement abdominal VAC. Placed on IV zosyn. Abdominal wound closure done on 04/25. BC/UCS positive for salmonella and ID consulted for input on treatment course. Dr. 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   2D echo with diffuse hypokinesis with EF 35-40%. Follow up BC negative. Patient developed RLE ischemia requiring Right aortic graft limb to below knee bypass on 05/01. Has had confusion due to encephalopathy limiting extubation. He was extubated on 05/05 and continues to have issues with confusion/paranoia. Clear liquid diet initiated with plans to advance if tolerating po's. PT/OT evaluations pending. MD recommending CIR.    Review of Systems  Unable to perform ROS: mental acuity   Past Medical History  Diagnosis Date  . Hypercholesteremia   . Collapsed lung     s/p fall, rib fracture   Past Surgical History  Procedure Laterality Date  . Hernia repair    . Pleural scarification      collapsed lung  . Abdominal aortic aneurysm repair N/A 09/21/2012    Procedure: ANEURYSM ABDOMINAL AORTIC REPAIR;  Surgeon: Sherren Kerns, MD;  Location: [facility] OR;  Service: Vascular;  Laterality: N/A;  . Patch angioplasty  09/21/2012    Procedure: PATCH ANGIOPLASTY;  Surgeon: Sherren Kerns, MD;  Location: [facility] OR;  Service: Vascular;;  Hemashield Patch  Angioplasty fo Right Common Femoral Artery  . Embolectomy Right 09/21/2012    Procedure: EMBOLECTOMY;  Surgeon: Sherren Kerns, MD;  Location: [facility] OR;  Service: Vascular;  Laterality: Right;  Embolectomy of right leg.  . Wound debridement  09/24/2012    Procedure: Removal of Abdominal wound VAC, and Abdominal Closure;  Surgeon: Sherren Kerns, MD;  Location: [facility] OR;  Service: Vascular;;  . Femoral-popliteal bypass graft Right 09/30/2012    Procedure: BYPASS GRAFT FEMORAL-POPLITEAL ARTERY;  Surgeon: Fransisco Hertz, MD;  Location: [facility] OR;  Service: Vascular;  Laterality: Right;   Family History  Problem Relation Age of Onset  . Diabetes Mother   . Diabetes Father    Social History:  Lives alone? Family in [place]?  reports that he has been smoking Cigarettes.  He has been smoking about 0.00 packs per day. He does not have any smokeless tobacco history on file. He reports that  drinks alcohol. He reports that he does not use illicit drugs.  Allergies: No Known Allergies  Medications Prior to Admission  Medication Sig Dispense Refill  . ibuprofen (ADVIL,MOTRIN) 200 MG tablet Take 400 mg by mouth every 6 (six) hours as needed for pain.      Marland Kitchen oxyCODONE-acetaminophen (PERCOCET/ROXICET) 5-325 MG per tablet Take 1 tablet by mouth every 4 (four) hours as needed for pain.        Home:    Functional History:   Functional Status:  Mobility:          ADL:    Cognition: Cognition Orientation Level: Oriented to person;Disoriented to time;Disoriented to  place;Disoriented to situation    Blood pressure 151/96, pulse 94, temperature 98.6 F (37 C), temperature source Oral, resp. rate 26, height 5\' 4"  (1.626 m), weight 57.6 kg (126 lb 15.8 oz), SpO2 100.00%. Physical Exam  Nursing note and vitals reviewed. Constitutional: He appears well-nourished.  HENT:  Head: Atraumatic.  Eyes: Pupils are equal, round, and reactive to light.  Cardiovascular: Regular rhythm.  Tachycardia present.    Pulmonary/Chest: No respiratory distress. He has wheezes.  Neurological: He is alert.  Dysarthric, ataxic, confused speech. Focused on getting to "[facility]". Oriented to self and able to state "1947" as age. Situation " Why cutting/ stapling me". Easily agitated and hard to redirect.     Results for orders placed during the hospital encounter of 09/21/12 (from the past 24 hour(s))  GLUCOSE, CAPILLARY     Status: Abnormal   Collection Time    10/04/12 12:15 PM      Result Value Range   Glucose-Capillary 117 (*) 70 - 99 mg/dL   Comment 1 Documented in Chart     Comment 2 Notify RN     Dg Chest Port 1 View  10/04/2012  *RADIOLOGY REPORT*  Clinical Data: Check endotracheal tube  PORTABLE CHEST - 1 VIEW  Comparison: 10/03/2012  Findings: Lungs remain hyperaerated with diffuse heterogeneous opacities.  Endotracheal tube, NG tube, feeding tube, right internal jugular central venous catheter are stable.  No pneumothorax.  IMPRESSION: Stable hyperaeration with bilateral pulmonary opacities.   Original Report Authenticated By: Jolaine Click, M.D.     Assessment/Plan: Diagnosis: severe deconditioning and encephalopathy after ruptured AAA 1. Does the need for close, 24 hr/day medical supervision in concert with the patient's rehab needs make it unreasonable for this patient to be served in a less intensive setting? Yes 2. Co-Morbidities requiring supervision/potential complications: hyponatremia, etoh abuse 3. Due to bladder management, bowel management, safety, skin/wound care, disease management, medication administration, pain management and patient education, does the patient require 24 hr/day rehab nursing? Yes 4. Does the patient require coordinated care of a physician, rehab nurse, PT (1-2 hrs/day, 5 days/week), OT (1-2 hrs/day, 5 days/week) and SLP (1-2 hrs/day, 5 days/week) to address physical and functional deficits in the context of the above medical diagnosis(es)? Yes Addressing  deficits in the following areas: balance, endurance, locomotion, strength, transferring, bowel/bladder control, bathing, dressing, feeding, grooming, toileting, cognition, speech, swallowing and psychosocial support 5. Can the patient actively participate in an intensive therapy program of at least 3 hrs of therapy per day at least 5 days per week? Potentially 6. The potential for patient to make measurable gains while on inpatient rehab is good 7. Anticipated functional outcomes upon discharge from inpatient rehab are supervision to min assist with PT, supervision to min assist with OT, supervision with SLP. 8. Estimated rehab length of stay to reach the above functional goals is: 2-3 weeks? 9. Does the patient have adequate social supports to accommodate these discharge functional goals? Yes 10. Anticipated D/C setting: Home 11. Anticipated post D/C treatments: HH therapy 12. Overall Rehab/Functional Prognosis: good  RECOMMENDATIONS: This patient's condition is appropriate for continued rehabilitative care in the following setting: CIR Patient has agreed to participate in recommended program. N/A  Note that insurance prior authorization may be required for reimbursement for recommended care.  Comment:Rehab RN to follow up.   Ranelle Oyster, MD, [place]     10/05/2012

## 2012-10-04 NOTE — Progress Notes (Signed)
PARENTERAL NUTRITION CONSULT NOTE - FOLLOW UP  Pharmacy Consult for TPN Indication: s/p AAA repair with open abdomen; 2 month hx of poor po intake with significant weight loss  No Known Allergies  Patient Measurements: Height: 5\' 4"  (162.6 cm) Weight: 113 lb 15.7 oz (51.7 kg) IBW/kg (Calculated) : 59.2  Vital Signs: Temp: 98.2 F (36.8 C) (05/05 0730) Temp src: Oral (05/05 0730) BP: 121/60 mmHg (05/05 0808) Pulse Rate: 70 (05/05 0808) Intake/Output from previous day: 05/04 0701 - 05/05 0700 In: 3437.2 [I.V.:1193.3; NG/GT:810; IV Piggyback:50; TPN:1293.9] Out: 2640 [Urine:2290; Emesis/NG output:150; Stool:200] Intake/Output from this shift: Total I/O In: 225.6 [I.V.:115.6; NG/GT:30; TPN:80] Out: 125 [Urine:125]  Labs:  Recent Labs  10/02/12 0443 10/03/12 0335 10/04/12 0410  WBC 8.1 10.8* 8.1  HGB 9.1* 9.3* 9.0*  HCT 27.4* 28.0* 26.6*  PLT 117* 140* 153     Recent Labs  10/02/12 0443 10/03/12 0335 10/04/12 0410  NA 137 134* 134*  K 3.6 3.8 3.8  CL 99 103 103  CO2 33* 25 23  GLUCOSE 119* 121* 158*  BUN 29* 28* 27*  CREATININE 0.67 0.74 0.63  CALCIUM 7.9* 8.0* 7.8*  MG  --  2.0 1.9  PHOS  --  4.3  --   PROT  --   --  5.9*  ALBUMIN  --   --  1.6*  AST  --   --  41*  ALT  --   --  20  ALKPHOS  --   --  57  BILITOT  --   --  0.5  TRIG  --   --  71   Estimated Creatinine Clearance: 64.6 ml/min (by C-G formula based on Cr of 0.63).    Recent Labs  10/02/12 1618 10/03/12 0037 10/03/12 0833  GLUCAP 106* 135* 117*    Insulin Requirements in the past 24 hours:  1 units Novolog SSI  Current Nutrition:  Clinimix E 5/15 at 49ml/hr and lipids 10 ml/hr MWF provides 48gm protein and avg 900 Kcal daily  Vital AF 1.2 at 30 ml/hr provides 66gm protein and 864 kcal daily  Nutritional Goals:  1400-1550 kCal, 85-100 grams of protein per day (re-estimated 5/2)  Assessment: Pt presented to [facility] with 2 month hx of poor PO intake and weight loss due to mouth  pain, abdominal pain, and low back pain. Down to 99# from usual weight of 120#. Found to have ruptured AAA, AFib and transferred to [facility]. Now s/p AAA repair on 4/22.   GI: s/p abd closure 4/25. NGT output 147ml/24h. Panda in place - tube feeds at 52ml/hr, RN plans to advance to 50cc/hr at noon today. TF goal is 55cc/hr, noted good tolerance of TF, qualifies for d/c TPN. Noted 200cc of stool out in last 24h. Cdiff negative 5/4. Endo: No hx DM - CBGs controlled on SSI alone with minimal insulin requirements. Qualifies for SSI/CBG checks to be discontinued in setting of good control while on goal nutrition (d/c 5/4) Lytes: Na low, may be due to fluid overload. Other lytes ok. Corrected Ca 9.8 Renal: No hx renal dz - Scr stable, UOP 1.8 ml/kg/hr. I/O + 0.95 yesterday Pulm: Hx COPD, heavy smoker - VDRF 40% Fio2 - needs PAH w/u. Extubated 10/04/2012  Cards:  New post-op afib. BP quite variable, HR 55-70 - metoprolol IV scheduled Hepatobil: Hx EtOH abuse. AST trending down, ALT + alk phos WNL, Tbili WNL. Trig 71. Prealbumin 2.9 - actually trend down, expect to be low with ongoing inflammation, malnutrition  and post-op status - hope for trend upward with next check (tomorrow) Neuro: prn Fentanyl, RASS 1 (goal 0) ID: Rocephin D#7 (total abx D#12) for salmonella bacteremia/UTI - Tmax 100.4, WBC nml, ID following - plan for 6 wks of IV Rocephin. Cdiff neg. Best Practices: SCDs, LMWH 40, mouth care, PPI susp TPN Access: R-IJ CVC TPN day#: 11  Plan:  - Will d/c TPN after current bag - Will d/c pending TPN labs - Will d/c SSI and CBG checks as these were ordered for TPN monitoring, and patient has been well controlled (no hx DM) - Pharmacy signs off, please re-consult if needed.  Thanks, Cabrina Shiroma K. Allena Katz, PharmD, BCPS.  Clinical Pharmacist Pager [phone]. 10/04/2012 11:29 AM

## 2012-10-04 NOTE — Progress Notes (Signed)
PULMONARY  / CRITICAL CARE MEDICINE  Name: Charles [name] MRN: [id number] DOB: 1945-09-19    ADMISSION DATE:  09/21/2012 CONSULTATION DATE:  09/22/12  REFERRING MD :  Vascular PRIMARY SERVICE: Vascular  CHIEF COMPLAINT:  Post op respiratory failure  BRIEF PATIENT DESCRIPTION: 67 yo with h/o ETOH transferred for [facility] on 4/22 with ruptured AAA requiring surgical repair.  Post op course complicated by VDRF, ARDS and Salmonella bacteremia and the need for R aorto-femoral bypass.   SIGNIFICANT EVENTS:  4/23 - OR for AAA repair, Aorto left common iliac right common femoral bypass,aortic endarterectomy, right femoral endarterectomy profundaplasty, placement abdominal VAC 4/24 - Off pressors, tolerating pressure support, TNA started TTE 09/25/12 >> Left ventricle: The cavity size was normal. Wall thickness was normal. Systolic function was moderately reduced. EF 35% to40%. Diffuse hypokinesis. Aortic valve: Mild to moderate regurgitation. Mitral valve: Calcified annulus. Mildly thickened leaflets. Moderate regurgitation. Left atrium: The atrium was mildly dilated. Right atrium: The atrium was mildly dilated. Pulmonary arteries: Systolic pressure was severely increased. PA peak pressure: 71mm Hg (S). 5/01 - R aorto-femoral bypass  STUDIES: 4/23 CT abd/pelvis>>> rupturing AAA  LINES / TUBES: ETT 4/23>>> 5/5 R IJ PA cath 4/23>>> 4/24 R radial artery 4/23>>> 5/30 Rt IJ CVL 4/24 >>  CULTURES: blood 4/23 >>> Salmonella  Urine 4/23 >>  Salmonella Blood 4/29 >>  Blood 5/3 >>  C diff 5/4 >> NEG  ANTIBIOTICS: Ancef 4/22>>>4/23 Zosyn 4/23>>> 4/29 Ceftriaxone 4/29 >>  Flagyl IV 4/29 >> 5/2  SUBJECTIVE / INTERVAL EVENTS:  Passed WUA. + F/C. Now extubated and looks good from respiratory perspective but agitated delirium.   VITAL SIGNS: Temp:  [97.4 F (36.3 C)-100.4 F (38 C)] 98.2 F (36.8 C) (05/05 1219) Pulse Rate:  [55-112] 89 (05/05 1700) Resp:  [13-27] 23 (05/05 1700) BP:  (81-176)/(53-116) 153/90 mmHg (05/05 1700) SpO2:  [93 %-100 %] 100 % (05/05 1600) FiO2 (%):  [39.9 %-40.5 %] 40.4 % (05/05 1000) Weight:  [51.7 kg (113 lb 15.7 oz)] 51.7 kg (113 lb 15.7 oz) (05/05 0500)  HEMODYNAMICS:    VENTILATOR SETTINGS: Vent Mode:  [-] CPAP FiO2 (%):  [39.9 %-40.5 %] 40.4 % Set Rate:  [10 bmp] 10 bmp Vt Set:  [600 mL] 600 mL PEEP:  [5 cmH20] 5 cmH20 Pressure Support:  [5 cmH20-10 cmH20] 10 cmH20 Plateau Pressure:  [24 cmH20] 24 cmH20  INTAKE / OUTPUT: Intake/Output     05/04 0701 - 05/05 0700 05/05 0701 - 05/06 0700   I.V. (mL/kg) 1193.3 (23.1) 233.7 (4.5)   Other 90 45   NG/GT 810 580   IV Piggyback 50 50   TPN 1293.9 440   Total Intake(mL/kg) 3437.2 (66.5) 1348.7 (26.1)   Urine (mL/kg/hr) 2290 (1.8) 760 (1.3)   Emesis/NG output 150 (0.1)    Stool 200 (0.2) 200 (0.3)   Total Output 2640 960   Net +797.2 +388.7          PHYSICAL EXAMINATION: General: agitated, no respiratory distress Neuro: No focal deficits HEENT: WNL Cardiovascular: RRR Lungs: clear anteriorly Abdomen:soft, mildly tender, no frebound Ext: no edema  LABS:  Recent Labs Lab 09/28/12 0402 09/29/12 0443 10/02/12 0621 10/03/12 1049  PHART 7.458* 7.512* 7.447 7.389  PCO2ART 50.6* 44.4 43.2 36.1  PO2ART 90.0 65.6* 78.0* 67.0*  HCO3 35.6* 35.4* 29.8* 21.6  TCO2 37 36.7 31 23   O2SAT 97.0 94.5 96.0 92.0    Recent Labs Lab 09/28/12 0430 09/29/12 0340 09/30/12 0345 10/01/12 0355 10/02/12 0443 10/03/12  0335 10/04/12 0410  NA 138 140 138 138 137 134* 134*  K 3.8 3.4* 3.3* 3.8 3.6 3.8 3.8  CL 97 96 95* 98 99 103 103  CO2 34* 40* 38* 36* 33* 25 23  GLUCOSE 129* 105* 124* 124* 119* 121* 158*  BUN 29* 30* 30* 32* 29* 28* 27*  CREATININE 0.77 0.76 0.70 0.72 0.67 0.74 0.63  CALCIUM 8.4 8.6 8.7 8.2* 7.9* 8.0* 7.8*  MG 2.0 1.9 1.9 1.9  --  2.0 1.9  PHOS 4.4 4.7* 4.0 4.1  --  4.3  --     Recent Labs Lab 10/02/12 0443 10/03/12 0335 10/04/12 0410  HGB 9.1* 9.3* 9.0*   HCT 27.4* 28.0* 26.6*  WBC 8.1 10.8* 8.1  PLT 117* 140* 153    Recent Labs Lab 09/28/12 0430 09/29/12 0340 09/30/12 0345 09/30/12 0346 10/04/12 0410  AST  --  99* 62*  --  41*  ALT  --  52 37  --  20  ALKPHOS  --  91 77  --  57  BILITOT  --  2.0* 1.1  --  0.5  PROT  --  6.3 6.3  --  5.9*  ALBUMIN  --  1.8* 1.8*  --  1.6*  INR 1.11 1.07  --  1.16  --     Recent Labs Lab 10/02/12 0813 10/02/12 1618 10/03/12 0037 10/03/12 0833 10/04/12 1215  GLUCAP 121* 106* 135* 117* 117*   CXR: mild interstitial prominence    ASSESSMENT / PLAN:  PULMONARY A: Acute post op respiratory failure.   Hx of smoking and presumed COPD.  Pulmonary edema, less likely ARDS - improved.   Pulmonary hypertension. P:   Airway hygiene Cont BDs  CARDIOVASCULAR A: Ruptured AAA s/p repair.  S/p R aorto-femoral bypass.  HTN. PVD. P:  Per Vascular Surgery Hydralazine, Lopressor, Labetalol PRN  RENAL A:  Hypokalemia - resolved.    P:   Cont IVFs Correct electrolytes as needed  GASTROINTESTINAL A:  Severe protein calorie malnutrition.  Hyperbilirubinemia - resolved.   P:   Advance TFs and off TPN as tolerated  HEMATOLOGIC A: Acute blood loss anemia - none further. Thrombocytopenia - resolved P:  Trend CBC  INFECTIOUS A:  Salmonella UTI and bacteremia. P:   ID following Antibiotics / cultures as above  ENDOCRINE A:  Hyperglycemia, resolved P:   No need for SSI   NEUROLOGIC A:  Acute encephalopathy P:   Minimize sedation Consider haldol PRN Consider Precedex if haloperidol fails   35 mins CCM    Billy Fischer, MD ; [facility] [phone].  After 5:30 PM or weekends, call [phone]

## 2012-10-04 NOTE — Procedures (Signed)
Extubation Procedure Note  Patient Details:   Name: Charles [name] DOB: 10-Feb-1946 MRN: [id number]   Airway Documentation:     Evaluation  O2 sats: stable throughout and currently acceptable Complications: No apparent complications Patient did tolerate procedure well. Bilateral Breath Sounds: Clear Suctioning: Airway Yes Pt awake and alert, extubated per order. Placed on 3l [place], sat 92%. Positive cuff leak. Pt able to vocalize.  [place], [place] 10/04/2012, 10:41 AM

## 2012-10-04 NOTE — Progress Notes (Signed)
INFECTIOUS DISEASE PROGRESS NOTE  ID: [name] is a 67 y.o. male with  Principal Problem:   AAA (abdominal aortic aneurysm, ruptured) Active Problems:   Hyponatremia   Adult failure to thrive   Abdominal pain   Dehydration   Generalized weakness   New onset atrial fibrillation   Alcohol abuse   Acute respiratory failure   Bacteremia   Salmonella bacteremia  Subjective: Pt confused  Abtx:  Anti-infectives   Start     Dose/Rate Route Frequency Ordered Stop   09/28/12 1700  cefTRIAXone (ROCEPHIN) 2 g in dextrose 5 % 50 mL IVPB     2 g 100 mL/hr over 30 Minutes Intravenous Every 24 hours 09/28/12 1520     09/28/12 1600  metroNIDAZOLE (FLAGYL) tablet 500 mg  Status:  Discontinued     500 mg Oral 3 times per day 09/28/12 1520 10/01/12 1145   09/22/12 1500  ceFAZolin (ANCEF) IVPB 1 g/50 mL premix  Status:  Discontinued     1 g 100 mL/hr over 30 Minutes Intravenous Every 8 hours 09/22/12 0842 09/22/12 0917   09/22/12 1000  piperacillin-tazobactam (ZOSYN) IVPB 3.375 g  Status:  Discontinued     3.375 g 12.5 mL/hr over 240 Minutes Intravenous 3 times per day 09/22/12 0932 09/28/12 1520   09/21/12 2345  [MAR Hold]  ceFAZolin (ANCEF) IVPB 2 g/50 mL premix  Status:  Discontinued     (On MAR Hold since 09/22/12 0004)  Comments:  Send with pt to OR   2 g 100 mL/hr over 30 Minutes Intravenous On call 09/21/12 2343 09/22/12 0842   09/21/12 1400  [MAR Hold]  cefTRIAXone (ROCEPHIN) 1 g in dextrose 5 % 50 mL IVPB  Status:  Discontinued     (On MAR Hold since 09/22/12 0004)   1 g 100 mL/hr over 30 Minutes Intravenous Every 24 hours 09/21/12 1257 09/22/12 0842      Medications:  Scheduled: . ipratropium  0.5 mg Nebulization Q6H   And  . albuterol  2.5 mg Nebulization Q6H  . antiseptic oral rinse  15 mL Mouth Rinse QID  . cefTRIAXone (ROCEPHIN)  IV  2 g Intravenous Q24H  . chlorhexidine  15 mL Mouth Rinse BID  . enoxaparin (LOVENOX) injection  40 mg Subcutaneous Q24H  .  feeding supplement (VITAL AF 1.2 CAL)  1,000 mL Per Tube Q24H  . metoprolol tartrate  25 mg Per Tube BID  . pantoprazole sodium  40 mg Per Tube Daily  . sodium chloride  10-40 mL Intracatheter Q12H    Objective: Vital signs in last 24 hours: Temp:  [97.2 F (36.2 C)-100.4 F (38 C)] 98.2 F (36.8 C) (05/05 0730) Pulse Rate:  [42-140] 68 (05/05 1000) Resp:  [13-35] 19 (05/05 1000) BP: (81-198)/(51-118) 154/74 mmHg (05/05 1000) SpO2:  [97 %-100 %] 99 % (05/05 0937) FiO2 (%):  [39.9 %-40.5 %] 40.4 % (05/05 1000) Weight:  [51.7 kg (113 lb 15.7 oz)] 51.7 kg (113 lb 15.7 oz) (05/05 0500)   General appearance: alert, delirious and no distress Resp: clear to auscultation bilaterally Cardio: irregular GI: normal findings: bowel sounds normal, soft, non-tender and wounds clean.   Lab Results  Recent Labs  10/03/12 0335 10/04/12 0410  WBC 10.8* 8.1  HGB 9.3* 9.0*  HCT 28.0* 26.6*  NA 134* 134*  K 3.8 3.8  CL 103 103  CO2 25 23  BUN 28* 27*  CREATININE 0.74 0.63   Liver Panel  Recent Labs  10/04/12  0410  PROT 5.9*  ALBUMIN 1.6*  AST 41*  ALT 20  ALKPHOS 57  BILITOT 0.5   Sedimentation Rate No results found for this basename: ESRSEDRATE,  in the last 72 hours C-Reactive Protein No results found for this basename: CRP,  in the last 72 hours  Microbiology: Recent Results (from the past 240 hour(s))  CULTURE, RESPIRATORY (NON-EXPECTORATED)     Status: None   Collection Time    09/27/12  4:00 AM      Result Value Range Status   Specimen Description TRACHEAL ASPIRATE   Final   Special Requests NONE   Final   Gram Stain     Final   Value: NO WBC SEEN     RARE SQUAMOUS EPITHELIAL CELLS PRESENT     RARE YEAST   Culture RARE CANDIDA ALBICANS   Final   Report Status 09/29/2012 FINAL   Final  CULTURE, BLOOD (ROUTINE X 2)     Status: None   Collection Time    09/28/12  6:20 PM      Result Value Range Status   Specimen Description BLOOD RIGHT ARM   Final   Special  Requests BOTTLES DRAWN AEROBIC AND ANAEROBIC 10CC   Final   Culture  Setup Time 09/29/2012 03:19   Final   Culture     Final   Value:        BLOOD CULTURE RECEIVED NO GROWTH TO DATE CULTURE WILL BE HELD FOR 5 DAYS BEFORE ISSUING A FINAL NEGATIVE REPORT   Report Status PENDING   Incomplete  CULTURE, BLOOD (ROUTINE X 2)     Status: None   Collection Time    09/28/12  6:35 PM      Result Value Range Status   Specimen Description BLOOD RIGHT HAND   Final   Special Requests BOTTLES DRAWN AEROBIC ONLY 3CC   Final   Culture  Setup Time 09/29/2012 03:19   Final   Culture     Final   Value:        BLOOD CULTURE RECEIVED NO GROWTH TO DATE CULTURE WILL BE HELD FOR 5 DAYS BEFORE ISSUING A FINAL NEGATIVE REPORT   Report Status PENDING   Incomplete  CULTURE, BLOOD (ROUTINE X 2)     Status: None   Collection Time    10/02/12  9:54 PM      Result Value Range Status   Specimen Description BLOOD LEFT HAND   Final   Special Requests BOTTLES DRAWN AEROBIC ONLY 6CC   Final   Culture  Setup Time 10/03/2012 04:26   Final   Culture     Final   Value:        BLOOD CULTURE RECEIVED NO GROWTH TO DATE CULTURE WILL BE HELD FOR 5 DAYS BEFORE ISSUING A FINAL NEGATIVE REPORT   Report Status PENDING   Incomplete  CULTURE, BLOOD (ROUTINE X 2)     Status: None   Collection Time    10/02/12  9:55 PM      Result Value Range Status   Specimen Description BLOOD LEFT HAND   Final   Special Requests BOTTLES DRAWN AEROBIC ONLY [facility]   Final   Culture  Setup Time 10/03/2012 04:26   Final   Culture     Final   Value:        BLOOD CULTURE RECEIVED NO GROWTH TO DATE CULTURE WILL BE HELD FOR 5 DAYS BEFORE ISSUING A FINAL NEGATIVE REPORT   Report Status PENDING  Incomplete  CLOSTRIDIUM DIFFICILE BY PCR     Status: None   Collection Time    10/03/12  1:20 AM      Result Value Range Status   C difficile by pcr NEGATIVE  NEGATIVE Final    Studies/Results: Dg Chest Port 1 View  10/04/2012  *RADIOLOGY REPORT*  Clinical Data:  Check endotracheal tube  PORTABLE CHEST - 1 VIEW  Comparison: 10/03/2012  Findings: Lungs remain hyperaerated with diffuse heterogeneous opacities.  Endotracheal tube, NG tube, feeding tube, right internal jugular central venous catheter are stable.  No pneumothorax.  IMPRESSION: Stable hyperaeration with bilateral pulmonary opacities.   Original Report Authenticated By: Jolaine Click, M.D.    Dg Chest Port 1 View  10/03/2012  *RADIOLOGY REPORT*  Clinical Data: Respiratory failure, ventilatory support  PORTABLE CHEST - 1 VIEW  Comparison: 10/02/2012  Findings: Stable support apparatus.  Endotracheal tube is superimposed over the feeding tubes but appears to be in the mid trachea.  Persistent diffuse asymmetric interstitial pattern throughout the lungs.  Minimal ill-defined nodularity in the right lower lung.  No enlarging effusion or definite pneumothorax.  No significant interval change.  IMPRESSION: Stable interstitial edema pattern.  No interval change.   Original Report Authenticated By: Judie Petit. Miles Costain, M.D.      Assessment/Plan: Ruptured AAA (repaired 09-24-13)  R Aortic to Below knee popliteal artery bypass (09-30-12)  Salmonella bacteremia (pan-sens)   W/u confusion W/u wasting ? rehab Repeat BCx 4-29 NGTD  Would plan for 6 weeks of IV ceftriaxone and then f/u in ID clinic for consideration of po flouroquinolone.  Repeat BCx are (-)  Total days of antibiotics: 13 (ceftriaxone)  available if questions, will see him in clinic in f/u  Johny Sax Infectious Diseases [phone] 10/04/2012, 10:51 AM   LOS: 13 days

## 2012-10-04 NOTE — Progress Notes (Addendum)
[facility] [place]  Post-op  Intra-abdominal Surgery note  Date of Surgery:  09/21/2012 - Ruptured AAA 13 days post-op Aorto to left common iliac and right common femoral artery bypass carotid sure to infrarenal abdominal aortic aneurysm, aortic endarterectomy, right common femoral endarterectomy with profundoplasty, ligation of inferior mesenteric artery right external iliac artery and right internal iliac artery  4 Days Post-Op Procedure(s):09/30/2012 Right BYPASS GRAFT FEMORAL-POPLITEAL ARTERY  History of Present Illness  Charles [name] is a 67 y.o. male who is  up s/p AAA reoair and right BYPASS GRAFT FEMORAL-POPLITEAL ARTERY  Pt is stable.  Salmonella in blood and sputum Diarrhea- C. Diff neg, reglan stopped fentanyl drip with vent for aggitation   IMAGING: Dg Chest Port 1 View  10/03/2012  *RADIOLOGY REPORT*  Clinical Data: Respiratory failure, ventilatory support  PORTABLE CHEST - 1 VIEW  Comparison: 10/02/2012  Findings: Stable support apparatus.  Endotracheal tube is superimposed over the feeding tubes but appears to be in the mid trachea.  Persistent diffuse asymmetric interstitial pattern throughout the lungs.  Minimal ill-defined nodularity in the right lower lung.  No enlarging effusion or definite pneumothorax.  No significant interval change.  IMPRESSION: Stable interstitial edema pattern.  No interval change.   Original Report Authenticated By: Judie Petit. Miles Costain, M.D.     Significant Diagnostic Studies: CBC Lab Results  Component Value Date   WBC 8.1 10/04/2012   HGB 9.0* 10/04/2012   HCT 26.6* 10/04/2012   MCV 88.1 10/04/2012   PLT 153 10/04/2012    BMET    Component Value Date/Time   NA 134* 10/04/2012 0410   K 3.8 10/04/2012 0410   CL 103 10/04/2012 0410   CO2 23 10/04/2012 0410   GLUCOSE 158* 10/04/2012 0410   BUN 27* 10/04/2012 0410   CREATININE 0.63 10/04/2012 0410   CALCIUM 7.8* 10/04/2012 0410   GFRNONAA >90 10/04/2012 0410   GFRAA >90 10/04/2012 0410     COAG Lab Results  Component Value Date   INR 1.16 09/30/2012   INR 1.07 09/29/2012   INR 1.11 09/28/2012   No results found for this basename: PTT    I/O last 3 completed shifts: In: 5111.2 [I.V.:1607.3; Other:210; NG/GT:1050; IV Piggyback:50] Out: 1610 [Urine:3565; Emesis/NG output:290; Stool:204]    Physical Examination BP Readings from Last 3 Encounters:  10/04/12 125/64  10/04/12 125/64  10/04/12 125/64   Temp Readings from Last 3 Encounters:  10/04/12 98.2 F (36.8 C) Oral  10/04/12 98.2 F (36.8 C) Oral  10/04/12 98.2 F (36.8 C) Oral   SpO2 Readings from Last 3 Encounters:  10/04/12 100%  10/04/12 100%  10/04/12 100%   Pulse Readings from Last 3 Encounters:  10/04/12 55  10/04/12 55  10/04/12 55    General:Sedated male - on vent Pulmonary: normal non-labored breathing , Cardiac: Heart rate : irregular ,  Abdomen:abdomen soft Abdominal wound:clean, dry, intact  Neurologic: A&O X 3; Appropriate Affect ;  Speech is fluent/normal  Vascular Exam:BLE warm and well perfused Extremities without ischemic changes, no Gangrene, no cellulitis; no open wounds;   LE PULSES                         RIGHT                 LEFT      DORSALIS PEDIS      ANTERIOR TIBIAL  palpable  palpable   Assessment/Plan: Charles [name] is a 67  y.o. male who is 13 days post ruptured AAA repair and 4 Days Post-Op right BYPASS GRAFT FEMORAL-POPLITEAL ARTERY salmonella bacteremia on antibiotics Vent per CCM A.Fib - stable rate TF progressing Diarrhea - C. Diff negative  Charles [name],Charles [name]  10/04/2012 7:51 AM   Still with loose stools Confused A and O x 1 (name) Abdomen and groin incision without drainage Doppler DP left palpable DP right Will try clear liquid diet today and hopefully advance Continue tube feeds PT/OT  Fabienne Bruns, MD [facility] [place] Office: [phone] Pager: [phone]

## 2012-10-05 ENCOUNTER — Other Ambulatory Visit: Payer: Self-pay

## 2012-10-05 DIAGNOSIS — G92 Toxic encephalopathy: Secondary | ICD-10-CM

## 2012-10-05 DIAGNOSIS — R5381 Other malaise: Secondary | ICD-10-CM

## 2012-10-05 DIAGNOSIS — I713 Abdominal aortic aneurysm, ruptured: Secondary | ICD-10-CM

## 2012-10-05 LAB — CULTURE, BLOOD (ROUTINE X 2): Culture: NO GROWTH

## 2012-10-05 LAB — GLUCOSE, CAPILLARY

## 2012-10-05 MED ORDER — HALOPERIDOL LACTATE 5 MG/ML IJ SOLN
1.0000 mg | INTRAMUSCULAR | Status: DC | PRN
  Administered 2012-10-07 – 2012-10-08 (×2): 1 mg via INTRAVENOUS
  Filled 2012-10-05 (×2): qty 0.2

## 2012-10-05 MED ORDER — OXYCODONE-ACETAMINOPHEN 5-325 MG PO TABS
1.0000 | ORAL_TABLET | ORAL | Status: DC | PRN
  Administered 2012-10-05: 2 via ORAL
  Filled 2012-10-05: qty 2

## 2012-10-05 MED ORDER — HALOPERIDOL LACTATE 5 MG/ML IJ SOLN
INTRAMUSCULAR | Status: AC
  Administered 2012-10-05: 5 mg via INTRAVENOUS
  Filled 2012-10-05: qty 1

## 2012-10-05 MED ORDER — METOPROLOL TARTRATE 25 MG/10 ML ORAL SUSPENSION
50.0000 mg | Freq: Two times a day (BID) | ORAL | Status: DC
  Administered 2012-10-05: 50 mg via ORAL
  Filled 2012-10-05 (×3): qty 20

## 2012-10-05 MED ORDER — HALOPERIDOL LACTATE 5 MG/ML IJ SOLN: 5.0000 mg | Freq: Once | INTRAMUSCULAR | Status: AC

## 2012-10-05 MED ORDER — OXYCODONE-ACETAMINOPHEN 5-325 MG PO TABS: 1.0000 | ORAL_TABLET | ORAL | Status: DC | PRN

## 2012-10-05 MED ORDER — ENSURE COMPLETE PO LIQD
237.0000 mL | Freq: Two times a day (BID) | ORAL | Status: DC
  Administered 2012-10-05 – 2012-10-06 (×3): 237 mL via ORAL

## 2012-10-05 MED ORDER — HALOPERIDOL LACTATE 5 MG/ML IJ SOLN
1.0000 mg | Freq: Four times a day (QID) | INTRAMUSCULAR | Status: AC
  Administered 2012-10-05 – 2012-10-06 (×7): 1 mg via INTRAVENOUS
  Filled 2012-10-05 (×2): qty 1
  Filled 2012-10-05 (×3): qty 0.2
  Filled 2012-10-05 (×2): qty 1
  Filled 2012-10-05: qty 0.2
  Filled 2012-10-05: qty 1

## 2012-10-05 MED ORDER — LORAZEPAM 2 MG/ML IJ SOLN
0.5000 mg | INTRAMUSCULAR | Status: DC | PRN
  Administered 2012-10-06: 0.5 mg via INTRAVENOUS
  Administered 2012-10-06 (×2): 1 mg via INTRAVENOUS
  Administered 2012-10-07: 0.5 mg via INTRAVENOUS
  Administered 2012-10-08 (×2): 1 mg via INTRAVENOUS
  Filled 2012-10-05 (×6): qty 1

## 2012-10-05 MED ORDER — ENSURE PUDDING PO PUDG
1.0000 | Freq: Three times a day (TID) | ORAL | Status: DC
Start: 2012-10-05 — End: 2012-10-05

## 2012-10-05 MED ORDER — MORPHINE SULFATE 2 MG/ML IJ SOLN: 2.0000 mg | INTRAMUSCULAR | Status: DC | PRN

## 2012-10-05 MED ORDER — PRO-STAT SUGAR FREE PO LIQD
30.0000 mL | Freq: Two times a day (BID) | ORAL | Status: DC
  Administered 2012-10-05 – 2012-10-06 (×2): 30 mL via ORAL
  Filled 2012-10-05 (×3): qty 30

## 2012-10-05 MED ORDER — PANTOPRAZOLE SODIUM 40 MG PO PACK
40.0000 mg | PACK | Freq: Every day | ORAL | Status: DC
  Filled 2012-10-05: qty 20

## 2012-10-05 NOTE — Progress Notes (Signed)
eLink Physician-Brief Progress Note Patient Name: Charles [name] DOB: Nov 10, 1945 MRN: [id number]  Date of Service  10/05/2012   HPI/Events of Note  Paranoid agitation    eICU Interventions  Plan:  EKG shows QTc of less than 500 Haldol 5 mg IV times one - if no response will escalate dosing   Intervention Category Minor Interventions: Agitation / anxiety - evaluation and management  DETERDING,ELIZABETH 10/05/2012, 1:57 AM

## 2012-10-05 NOTE — Progress Notes (Signed)
NUTRITION CONSULT/FOLLOW UP  Intervention:    Initiate calorie count 5/6 ---> RD to document kcal, protein intake   Change to Ensure Complete twice daily (350 kcals, 13 gm protein per 8 fl oz bottle)  Prostat liquid protein 30 ml twice daily (100 kcals, 15 gm protein per dose) RD to follow for nutrition care plan  Nutrition Dx:   Increased nutrient needs related to post-op state as evidenced by estimated nutrition needs, ongoing  New Goal:   Oral intake with meals & supplements to meet >/= 90% of estimated nutrition needs, currently unmet  Monitor:   PO & supplemental intake, weight, labs, I/O's  Assessment:   RD consulted for calorie count.  Patient s/p procedures 4/22:  AORTO LEFT COMMON ILIAC RIGHT COMMON FEMORAL BYPASS  AORTIC ENDARTERECTOMY  RIGHT FEMORAL ENDARTERECTOMY PROFUNDAPLASTY  PLACEMENT OF ABDOMINAL WOUND VAC  S/p closure of abdomen 4/25.  Extubated 5/5.  Post-pyloric feeding tube, Vital AF 1.2 formula discontinued.  Advanced to Regular diet today, 1221.  RD visited while patient eating lunch.  Patient confused and PO intake is very poor; taking bites.  Ensure Pudding supplement ordered 3 times daily per MD.  Height: Ht Readings from Last 1 Encounters:  09/21/12 5\' 4"  (1.626 m)    Weight Status:   Wt Readings from Last 1 Encounters:  10/05/12 126 lb 15.8 oz (57.6 kg)    Re-estimated needs:  Kcal: 1500-1700 Protein: 85-100 gm Fluid: per MD  Skin: abdominal wound VAC  Diet Order: General   Intake/Output Summary (Last 24 hours) at 10/05/12 1353 Last data filed at 10/05/12 1000  Gross per 24 hour  Intake   1555 ml  Output   1310 ml  Net    245 ml    Last BM: 5/6  Labs:   Recent Labs Lab 09/30/12 0345 10/01/12 0355 10/02/12 0443 10/03/12 0335 10/04/12 0410  NA 138 138 137 134* 134*  K 3.3* 3.8 3.6 3.8 3.8  CL 95* 98 99 103 103  CO2 38* 36* 33* 25 23  BUN 30* 32* 29* 28* 27*  CREATININE 0.70 0.72 0.67 0.74 0.63  CALCIUM 8.7 8.2*  7.9* 8.0* 7.8*  MG 1.9 1.9  --  2.0 1.9  PHOS 4.0 4.1  --  4.3  --   GLUCOSE 124* 124* 119* 121* 158*    CBG (last 3)   Recent Labs  10/03/12 0833 10/04/12 0729 10/04/12 1215  GLUCAP 117* 133* 117*    Scheduled Meds: . antiseptic oral rinse  15 mL Mouth Rinse QID  . cefTRIAXone (ROCEPHIN)  IV  2 g Intravenous Q24H  . chlorhexidine  15 mL Mouth Rinse BID  . enoxaparin (LOVENOX) injection  40 mg Subcutaneous Q24H  . feeding supplement  1 Container Oral TID WC  . haloperidol lactate  1 mg Intravenous Q6H  . metoprolol tartrate  50 mg Oral BID  . [START ON 10/06/2012] pantoprazole sodium  40 mg Oral Daily  . sodium chloride  10-40 mL Intracatheter Q12H    Continuous Infusions:   Maureen Chatters, RD, LDN Pager #: [phone] After-Hours Pager #: [phone]

## 2012-10-05 NOTE — Progress Notes (Signed)
OT Cancellation Note  Patient Details Name: Charles [name] MRN: [id number] DOB: 02-Dec-1945   Cancelled Treatment:    Reason Eval/Treat Not Completed: Fatigue/lethargy limiting ability to participate;Other (comment) (Pt confused this am. will see tomorrow.)  [facility] Adysson Revelle, OTR/L  [phone] 10/05/2012 10/05/2012, 11:45 AM

## 2012-10-05 NOTE — Clinical Documentation Improvement (Signed)
SEPSIS DOCUMENTATION QUERY  THIS DOCUMENT IS NOT A PERMANENT PART OF THE MEDICAL RECORD  TO RESPOND TO THE THIS QUERY, FOLLOW THE INSTRUCTIONS BELOW:  1. If needed, update documentation for the patient's encounter via the notes activity.  2. Access this query again and click edit on the In [employer].  3. After updating, or not, click F2 to complete all highlighted (required) fields concerning your review. Select "additional documentation in the medical record" OR "no additional documentation provided".  4. Click Sign note button.  5. The deficiency will fall out of your In Basket *Please let us know if you are not able to complete this workflow by phone or e-mail (listed below).  Please update your documentation within the medical record to reflect your response to this query.                                                                                    10/05/12  Dear Dr. Darrick Penna Marton Redwood,   In a better effort to capture your patient's severity of illness, reflect appropriate length of stay and utilization of resources, a review of the patient medical record has revealed the following indicators.    Based on your clinical judgment, please clarify and document in a progress note and/or discharge summary the clinical condition associated with the following supporting information:  In responding to this query please exercise your independent judgment.  The fact that a query is asked, does not imply that any particular answer is desired or expected.    Possible Clinical Conditions?   Septicemia / Sepsis  Severe Sepsis  Septic Shock  Sepsis with UTI   Other Condition   Cannot clinically Determine      Risk Factors: Salmonella Bacteremia noted per 5/01 progress notes. Salmonella UTI and Bacteremia noted per 5/05 progress notes.   Diagnostics: 4/29:  Procalcitonin: 2.74.    Treatment:  Per 5/05 progress notes:  Ancef: 4/22-4/23. Zosyn:  4/23-4/29. Ceftriaxone: 4/29-current. Flagyl IV: 4/29-5/02.   Reviewed:  no additional documentation provided  Thank You,  Marciano Sequin,  Clinical Documentation Specialist:  Phone: [phone]  Health Information Management 

## 2012-10-05 NOTE — Progress Notes (Addendum)
[facility] [place]  Post-op  Intra-abdominal Surgery note  Date of Surgery: 09/21/2012 -  Repair ruptured AAA  09/30/2012  Surgeon(s): Fransisco Hertz, MD  5 Days Post-Op Procedure(s): Right BYPASS GRAFT FEMORAL-POPLITEAL ARTERY  History of Present Illness  Charles [name] is a 67 y.o. male who is extubated and confused. "I want a contract". Tolerating liquids.   Significant Diagnostic Studies: CBC Lab Results  Component Value Date   WBC 8.1 10/04/2012   HGB 9.0* 10/04/2012   HCT 26.6* 10/04/2012   MCV 88.1 10/04/2012   PLT 153 10/04/2012    BMET    Component Value Date/Time   NA 134* 10/04/2012 0410   K 3.8 10/04/2012 0410   CL 103 10/04/2012 0410   CO2 23 10/04/2012 0410   GLUCOSE 158* 10/04/2012 0410   BUN 27* 10/04/2012 0410   CREATININE 0.63 10/04/2012 0410   CALCIUM 7.8* 10/04/2012 0410   GFRNONAA >90 10/04/2012 0410   GFRAA >90 10/04/2012 0410    COAG Lab Results  Component Value Date   INR 1.16 09/30/2012   INR 1.07 09/29/2012   INR 1.11 09/28/2012   No results found for this basename: PTT    I/O last 3 completed shifts: In: 4020.1 [I.V.:1095.1; Other:135; NG/GT:1820; IV Piggyback:50] Out: 2205 [Urine:2005; Stool:200]    Physical Examination BP Readings from Last 3 Encounters:  10/05/12 151/96  10/05/12 151/96  10/05/12 151/96   Temp Readings from Last 3 Encounters:  10/05/12 98.6 F (37 C) Oral  10/05/12 98.6 F (37 C) Oral  10/05/12 98.6 F (37 C) Oral   SpO2 Readings from Last 3 Encounters:  10/05/12 100%  10/05/12 100%  10/05/12 100%   Pulse Readings from Last 3 Encounters:  10/05/12 94  10/05/12 94  10/05/12 94    General:Awake but disoriented, calm with family in room Pulmonary: normal non-labored breathing Cardiac: Heart rate : irregular, tachycardic ,  Abdomen:abdomen soft and non-tender Abdominal wound:clean, dry, intact  Vascular Exam:BLE warm and well perfused Some edema in BLE R>L Palpable DP  pulses Assessment/Plan: ARYN KOPS is a 67 y.o. male who is 14 days S/P AAA repair; 5 days post right fem-pop. Extubated  Confusion/ disorientation - cont to reorient Restraints prn Diarrhea - rectal tube Taking PO - post-op ileus resolving Acute on chronic Malnutrition - On Cl Liq and TF -  ? Adv diet and decrease TF OOB    ROCZNIAK,REGINA J  10/05/2012 9:16 AM   Still confused  Advance to regular diet Calorie count and Ensure Ok to transfer to 3300 if bed needed  Fabienne Bruns, MD [facility] [place] Office: [phone] Pager: [phone]

## 2012-10-05 NOTE — Progress Notes (Signed)
Rehab Admissions Coordinator Note:  Patient was screened by Trish Mage for appropriateness for an Inpatient Acute Rehab Consult.  An inpatient rehab consult was ordered and has been completed this morning.  I will follow this patient.  Trish Mage 10/05/2012, 12:11 PM  I can be reached at [phone].

## 2012-10-05 NOTE — Progress Notes (Signed)
PULMONARY  / CRITICAL CARE MEDICINE  Name: Charles [name] MRN: [id number] DOB: Aug 10, 1945    ADMISSION DATE:  09/21/2012 CONSULTATION DATE:  09/22/12  REFERRING MD :  Vascular PRIMARY SERVICE: Vascular  CHIEF COMPLAINT:  Post op respiratory failure  BRIEF PATIENT DESCRIPTION: 67 yo with h/o ETOH transferred for AP on 4/22 with ruptured AAA requiring surgical repair.  Post op course complicated by VDRF, ARDS and Salmonella bacteremia and the need for R aorto-femoral bypass.   SIGNIFICANT EVENTS:  4/23 - OR for AAA repair, Aorto left common iliac right common femoral bypass,aortic endarterectomy, right femoral endarterectomy profundaplasty, placement abdominal VAC 4/24 - Off pressors, tolerating pressure support, TNA started 4/26 Echo 09/25/12 EF 35% to40%. Diffuse hypokinesis. Aortic valve: Mild to moderate regurgitation. Mitral valve: Calcified annulus. Mildly thickened leaflets. Moderate regurgitation. Left atrium: The atrium was mildly dilated. Right atrium: The atrium was mildly dilated. Pulmonary arteries: Systolic pressure was severely increased. PA peak pressure: 71mm Hg (S). 5/01 - R aorto-femoral bypass  STUDIES: 4/23 CT abd/pelvis>>> rupturing AAA  LINES / TUBES: ETT 4/23>>> 5/5 R IJ PA cath 4/23>>> 4/24 R radial artery 4/23>>> 5/30 Rt IJ CVL 4/24 >>   CULTURES: blood 4/23 >>> Salmonella  Urine 4/23 >>  Salmonella Blood 4/29 >> NEG Blood 5/3 >>  Resp 5/4 >> few candida C diff 5/4 >> NEG  ANTIBIOTICS: Ancef 4/22>>>4/23 Zosyn 4/23>>> 4/29 Ceftriaxone 4/29 >>  Flagyl IV 4/29 >> 5/2  SUBJECTIVE / INTERVAL EVENTS:  Severe delirium with agitation. No resp distress  VITAL SIGNS: Temp:  [98.5 F (36.9 C)-99.1 F (37.3 C)] 98.5 F (36.9 C) (05/06 1159) Pulse Rate:  [48-112] 80 (05/06 1300) Resp:  [18-34] 21 (05/06 1300) BP: (133-174)/(71-107) 141/76 mmHg (05/06 1300) SpO2:  [93 %-100 %] 100 % (05/06 1200) Weight:  [57.6 kg (126 lb 15.8 oz)] 57.6 kg (126 lb 15.8 oz)  (05/06 0600)  HEMODYNAMICS:    VENTILATOR SETTINGS:    INTAKE / OUTPUT: Intake/Output     05/05 0701 - 05/06 0700 05/06 0701 - 05/07 0700   I.V. (mL/kg) 233.7 (4.1)    Other 45    NG/GT 1460 195   IV Piggyback 50    TPN 480    Total Intake(mL/kg) 2268.7 (39.4) 195 (3.4)   Urine (mL/kg/hr) 1445 (1) 225 (0.5)   Emesis/NG output     Stool 200 (0.1) 100 (0.2)   Total Output 1645 325   Net +623.7 -130          PHYSICAL EXAMINATION: General: agitated, no respiratory distress Neuro: No focal deficits HEENT: WNL Cardiovascular: RRR Lungs: clear anteriorly Abdomen:soft, mildly tender, no frebound Ext: no edema  LABS:  Recent Labs Lab 09/29/12 0443 10/02/12 0621 10/03/12 1049  PHART 7.512* 7.447 7.389  PCO2ART 44.4 43.2 36.1  PO2ART 65.6* 78.0* 67.0*  HCO3 35.4* 29.8* 21.6  TCO2 36.7 31 23   O2SAT 94.5 96.0 92.0    Recent Labs Lab 09/29/12 0340 09/30/12 0345 10/01/12 0355 10/02/12 0443 10/03/12 0335 10/04/12 0410  NA 140 138 138 137 134* 134*  K 3.4* 3.3* 3.8 3.6 3.8 3.8  CL 96 95* 98 99 103 103  CO2 40* 38* 36* 33* 25 23  GLUCOSE 105* 124* 124* 119* 121* 158*  BUN 30* 30* 32* 29* 28* 27*  CREATININE 0.76 0.70 0.72 0.67 0.74 0.63  CALCIUM 8.6 8.7 8.2* 7.9* 8.0* 7.8*  MG 1.9 1.9 1.9  --  2.0 1.9  PHOS 4.7* 4.0 4.1  --  4.3  --  Recent Labs Lab 10/02/12 0443 10/03/12 0335 10/04/12 0410  HGB 9.1* 9.3* 9.0*  HCT 27.4* 28.0* 26.6*  WBC 8.1 10.8* 8.1  PLT 117* 140* 153    Recent Labs Lab 09/29/12 0340 09/30/12 0345 09/30/12 0346 10/04/12 0410  AST 99* 62*  --  41*  ALT 52 37  --  20  ALKPHOS 91 77  --  57  BILITOT 2.0* 1.1  --  0.5  PROT 6.3 6.3  --  5.9*  ALBUMIN 1.8* 1.8*  --  1.6*  INR 1.07  --  1.16  --     Recent Labs Lab 10/02/12 1618 10/03/12 0037 10/03/12 0833 10/04/12 0729 10/04/12 1215  GLUCAP 106* 135* 117* 133* 117*   CXR: mild interstitial prominence    ASSESSMENT / PLAN:  PULMONARY A: Acute post op  respiratory failure.   Hx of smoking and presumed COPD.  Pulmonary edema, less likely ARDS - improved.   Pulmonary hypertension. P:   Airway hygiene Cont PRN BDs  CARDIOVASCULAR A: Ruptured AAA s/p repair.  S/p R aorto-femoral bypass.  HTN. PVD. P:  Per Vascular Surgery Hydralazine, Lopressor, Labetalol PRN  RENAL A:  Hypokalemia - resolved.    P:   Cont IVFs Correct electrolytes as indicated  GASTROINTESTINAL A:  Severe protein calorie malnutrition.  Hyperbilirubinemia - resolved.   P:   D/C NGT 5/06 Advance POs as tolerated  HEMATOLOGIC A: Acute blood loss anemia - none further. Thrombocytopenia - resolved P:  Trend CBC  INFECTIOUS A:  Salmonella UTI and bacteremia. P:   ID following Antibiotics / cultures as above  ENDOCRINE A:  Hyperglycemia, resolved P:   No need for SSI   NEUROLOGIC A:  Acute encephalopathy Hx of EtOH abuse P:   Scheduled low dose Halperidol PRN Ativan   Sister updated @ bedside   Billy Fischer, MD ; [facility] [phone].  After 5:30 PM or weekends, call [phone]

## 2012-10-05 NOTE — Evaluation (Signed)
Physical Therapy Evaluation Patient Details Name: Charles [name] MRN: [id number] DOB: 1946-01-16 Today's Date: 10/05/2012 Time: 3086-5784 PT Time Calculation (min): 15 min  PT Assessment / Plan / Recommendation Clinical Impression  Pt s/p AAA repair and right popliteal artery bypass graft with complication of VDRF post op.  Limited by post op confusion now as well as some limitiations in endurance and balance.  Will benefit from PT to address mobility issues.  May need CIR.      PT Assessment  Patient needs continued PT services    Follow Up Recommendations  CIR;Supervision/Assistance - 24 hour                Equipment Recommendations  None recommended by PT    Recommendations for Other Services Rehab consult   Frequency Min 3X/week    Precautions / Restrictions Precautions Precautions: Fall Restrictions Weight Bearing Restrictions: No   Pertinent Vitals/Pain VSS, No pain      Mobility  Bed Mobility Bed Mobility: Rolling Left;Left Sidelying to Sit;Sitting - Scoot to [place] of Bed Rolling Left: 4: Min assist Left Sidelying to Sit: 4: Min assist Sitting - Scoot to [place] of Bed: 4: Min assist Details for Bed Mobility Assistance: cues for initiation of movement as well as for technique Transfers Transfers: Sit to Stand;Stand to [employer] Transfers Sit to Stand: 1: +2 Total assist;With upper extremity assist;From bed Sit to Stand: Patient Percentage: 50% Stand to Sit: 1: +2 Total assist;With upper extremity assist;To chair/3-in-1;With armrests Stand to Sit: Patient Percentage: 30% Stand Pivot Transfers: 1: +2 Total assist Stand Pivot Transfers: Patient Percentage: 50% Details for Transfer Assistance: Pt initiated sit to stand without a lot of difficulty.  However to pivot, pt needed assist to move LEs and began to sit too soon staying flexed at trunk and hips.  Had to bring chair to pt as he did not complete end of transfer.  Pt slightly unsafe   Ambulation/Gait Ambulation/Gait Assistance: Not tested (comment) Stairs: No Wheelchair Mobility Wheelchair Mobility: No         PT Diagnosis: Generalized weakness  PT Problem List: Decreased activity tolerance;Decreased balance;Decreased mobility;Decreased knowledge of use of DME;Decreased safety awareness;Decreased cognition;Decreased knowledge of precautions PT Treatment Interventions: DME instruction;Gait training;Stair training;Functional mobility training;Therapeutic activities;Therapeutic exercise;Balance training;Patient/family education   PT Goals Acute Rehab PT Goals PT Goal Formulation: With patient Time For Goal Achievement: 10/19/12 Potential to Achieve Goals: Good Pt will go Supine/Side to Sit: Independently PT Goal: Supine/Side to Sit - Progress: Goal set today Pt will go Sit to Stand: Independently PT Goal: Sit to Stand - Progress: Goal set today Pt will Ambulate: 51 - 150 feet;with modified independence;with least restrictive assistive device PT Goal: Ambulate - Progress: Goal set today Pt will Go Up / Down Stairs: 3-5 stairs;with supervision;with least restrictive assistive device PT Goal: Up/Down Stairs - Progress: Goal set today  Visit Information  Last PT Received On: 10/05/12 Assistance Needed: +2    Subjective Data  Subjective: "I need a contract."  Pt confused asking for a contract for working with PT. 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 difficulties  Cognition   Cognition Arousal/Alertness: Awake/alert Behavior During Therapy: Restless Overall Cognitive Status: Impaired/Different from baseline Area of Impairment: Orientation;Attention;Memory;Following commands;Safety/judgement;Awareness;Problem solving Orientation Level: Disoriented to;Place;Time;Situation Current Attention Level: Focused Memory: Decreased recall of precautions Following Commands: Follows one step commands inconsistently;Follows one step commands with increased time Safety/Judgement: Decreased awareness of safety;Decreased awareness of deficits Problem Solving: Slow processing;Decreased initiation;Difficulty sequencing;Requires verbal cues;Requires tactile cues    Extremity/Trunk Assessment Right Lower Extremity Assessment RLE ROM/Strength/Tone: [facility] for tasks assessed Left Lower Extremity Assessment LLE ROM/Strength/Tone: [facility] for tasks assessed   Balance Static Sitting Balance Static Sitting - Balance Support: Bilateral upper extremity supported;Feet supported Static Sitting - Level of Assistance: 5: Stand by assistance Static Sitting - Comment/# of Minutes: 2 Static Standing Balance Static Standing - Balance Support: Bilateral upper extremity supported;During functional activity Static Standing - Level of Assistance: 3: Mod assist Static Standing - Comment/# of Minutes: 2 minutes.  supported with bil UE support of therapist and tech.  Did not stand fully upright.  Limited by confusion.  End of Session PT - End of Session Equipment Utilized During Treatment: Gait belt Activity Tolerance: Other (comment) (limited by confusion) Patient left: in chair;with call bell/phone within reach;with family/visitor present;with nursing in room Nurse Communication: Mobility status;Need for lift equipment       INGOLD,Kealani Leckey 10/05/2012, 12:08 PM [facility] Acute Rehabilitation [phone] (pager)

## 2012-10-06 LAB — CBC
HCT: 25.4 % — ABNORMAL LOW (ref 39.0–52.0)
MCV: 84.7 fL (ref 78.0–100.0)
Platelets: 217 10*3/uL (ref 150–400)
RBC: 3 MIL/uL — ABNORMAL LOW (ref 4.22–5.81)
WBC: 6.7 10*3/uL (ref 4.0–10.5)

## 2012-10-06 LAB — BASIC METABOLIC PANEL
CO2: 23 mEq/L (ref 19–32)
Chloride: 102 mEq/L (ref 96–112)
Sodium: 136 mEq/L (ref 135–145)

## 2012-10-06 MED ORDER — METOPROLOL TARTRATE 50 MG PO TABS
50.0000 mg | ORAL_TABLET | Freq: Two times a day (BID) | ORAL | Status: DC
  Administered 2012-10-06 – 2012-10-07 (×3): 50 mg via ORAL
  Filled 2012-10-06 (×6): qty 1

## 2012-10-06 MED ORDER — ACETAMINOPHEN 160 MG/5ML PO SOLN
650.0000 mg | ORAL | Status: DC | PRN
  Filled 2012-10-06: qty 20.3

## 2012-10-06 MED ORDER — QUETIAPINE FUMARATE ER 50 MG PO TB24
50.0000 mg | ORAL_TABLET | Freq: Every day | ORAL | Status: DC
  Administered 2012-10-06 – 2012-10-07 (×2): 50 mg via ORAL
  Filled 2012-10-06 (×3): qty 1

## 2012-10-06 NOTE — Progress Notes (Signed)
Occupational Therapy Evaluation Patient Details Name: Charles [name] MRN: [id number] DOB: 1945/12/10 Today's Date: 10/06/2012 Time: 4098-1191 OT Time Calculation (min): 15 min  OT Assessment / Plan / Recommendation Clinical Impression  Patient presents to OT with decreased ADL independence and safety s/p ruptured AAA requiring surgical repair, post op VDRF/ARDS, and R fem pop bypass graft. He is also confused. Will benefit from acute OT to maximize independence and safety.    OT Assessment  Patient needs continued OT Services    Follow Up Recommendations  CIR    Barriers to Discharge Decreased caregiver support    Equipment Recommendations  Other (comment) (TBD at next venue of care)    Recommendations for Other Services    Frequency  Min 2X/week    Precautions / Restrictions Precautions Precautions: Fall Restrictions Weight Bearing Restrictions: No   Pertinent Vitals/Pain     ADL  Grooming: Maximal assistance Where Assessed - Grooming: Supine, head of bed up Upper Body Dressing: +1 Total assistance Where Assessed - Upper Body Dressing: Supine, head of bed up Lower Body Dressing: +1 Total assistance Where Assessed - Lower Body Dressing: Supine, head of bed up Transfers/Ambulation Related to ADLs: NT on eval due to lack of +2 assistance ADL Comments: Patient presents confused, needs cues throughout for completion of ADLs.    OT Diagnosis: Generalized weakness;Cognitive deficits  OT Problem List: Decreased strength;Decreased range of motion;Decreased activity tolerance;Decreased cognition;Decreased safety awareness;Decreased knowledge of use of DME or AE;Cardiopulmonary status limiting activity OT Treatment Interventions: Self-care/ADL training;Therapeutic exercise;DME and/or AE instruction;Therapeutic activities;Cognitive remediation/compensation;Patient/family education   OT Goals Acute Rehab OT Goals OT Goal Formulation: With patient Time For Goal Achievement:  10/20/12 Potential to Achieve Goals: Good ADL Goals Pt Will Perform Grooming: with min assist;Supine, head of bed up;Sitting, chair;Sitting, edge of bed ADL Goal: Grooming - Progress: Goal set today Pt Will Perform Upper Body Bathing: with min assist;Supine, head of bed up;Sitting, chair;Sitting, edge of bed ADL Goal: Upper Body Bathing - Progress: Goal set today Pt Will Perform Lower Body Bathing: with min assist;Sitting, chair;Sitting, edge of bed;Supine, head of bed up ADL Goal: Lower Body Bathing - Progress: Goal set today Pt Will Perform Upper Body Dressing: with min assist;Supine, head of bed up;Sitting, chair;Sitting, bed ADL Goal: Upper Body Dressing - Progress: Goal set today Pt Will Perform Lower Body Dressing: with min assist;Supine, head of bed up;Sitting, chair;Sitting, bed ADL Goal: Lower Body Dressing - Progress: Goal set today Pt Will Transfer to Toilet: with min assist;with DME;3-in-1 ADL Goal: Toilet Transfer - Progress: Goal set today  Visit Information  Last OT Received On: 10/06/12 Assistance Needed: +2    Subjective Data      Prior Functioning     Home Living Lives With: Family Available Help at Discharge: Family;Available 24 hours/day;Other (Comment) (trying to arrange 24 hour care) Type of Home: House Home Access: Stairs to enter Entrance Stairs-Number of Steps: several Home Layout: One level Home Adaptive Equipment: None Additional Comments: TBA (pt confused and unable to report) Prior Function Level of Independence: Independent Able to Take Stairs?: Yes Driving: No Vocation: Retired Musician: No difficulties         Vision/Perception     Copywriter, advertising Arousal/Alertness: Awake/alert Behavior During Therapy: Flat affect Overall Cognitive Status: Impaired/Different from baseline Area of Impairment: Orientation;Attention;Memory;Following commands;Safety/judgement;Awareness;Problem solving Orientation Level:  Disoriented to;Place;Time;Situation Current Attention Level: Focused Memory: Decreased recall of precautions Following Commands: Follows one step commands inconsistently;Follows one step commands with increased time Safety/Judgement: Decreased  awareness of safety;Decreased awareness of deficits Problem Solving: Slow processing;Decreased initiation;Difficulty sequencing;Requires verbal cues;Requires tactile cues    Extremity/Trunk Assessment Right Upper Extremity Assessment RUE ROM/Strength/Tone: [facility] for tasks assessed Left Upper Extremity Assessment LUE ROM/Strength/Tone: WFL for tasks assessed     End of Session OT - End of Session Activity Tolerance: Patient tolerated treatment well Patient left: in bed;with call bell/phone within reach;with restraints reapplied  GO     Jamorian Dimaria A 10/06/2012, 5:47 PM

## 2012-10-06 NOTE — Progress Notes (Signed)
Physical Therapy Treatment Patient Details Name: Charles [name] MRN: [id number] DOB: 10-Oct-1945 Today's Date: 10/06/2012 Time: 5784-6962 PT Time Calculation (min): 17 min  PT Assessment / Plan / Recommendation Comments on Treatment Session  pt presents with AAA rupture and repair.  pt confused, but cooperative with mobility.  pt attempts to pull at flexiseal if mittens are off.      Follow Up Recommendations  CIR     Does the patient have the potential to tolerate intense rehabilitation     Barriers to Discharge        Equipment Recommendations  None recommended by PT    Recommendations for Other Services Rehab consult  Frequency Min 3X/week   Plan Discharge plan remains appropriate;Frequency remains appropriate    Precautions / Restrictions Precautions Precautions: Fall Restrictions Weight Bearing Restrictions: No   Pertinent Vitals/Pain Pt denies pain.      Mobility  Bed Mobility Bed Mobility: Right Sidelying to Sit;Rolling Right;Sitting - Scoot to Edge of Bed Rolling Right: 4: Min assist;With rail Right Sidelying to Sit: 4: Min assist Sitting - Scoot to Edge of Bed: 4: Min assist Details for Bed Mobility Assistance: cues for initiation, safe technique, attending to task and not grabbing lines.   Transfers Transfers: Sit to Stand;Stand to Sit;Stand Pivot Transfers Sit to Stand: 1: +2 Total assist;With upper extremity assist;From bed Sit to Stand: Patient Percentage: 50% Stand to Sit: 1: +2 Total assist;With upper extremity assist;To chair/3-in-1;With armrests Stand to Sit: Patient Percentage: 30% Stand Pivot Transfers: 1: +2 Total assist Stand Pivot Transfers: Patient Percentage: 50% Details for Transfer Assistance: Needed cueing for sit to stand, but pt able to initiate with only one verbal cue.  Cues for upright posture and step by step through SPT.  pt takes very small pivotal steps to reach chair and needs cues for maintaining attention to task.    Ambulation/Gait Ambulation/Gait Assistance: Not tested (comment) Stairs: No Wheelchair Mobility Wheelchair Mobility: No    Exercises     PT Diagnosis:    PT Problem List:   PT Treatment Interventions:     PT Goals Acute Rehab PT Goals Time For Goal Achievement: 10/19/12 Potential to Achieve Goals: Good PT Goal: Supine/Side to Sit - Progress: Progressing toward goal PT Goal: Sit to Stand - Progress: Progressing toward goal  Visit Information  Last PT Received On: 10/06/12 Assistance Needed: +2    Subjective Data  Subjective: "Oh, we can take that tube out."- pt indicating wanting his flexiseal out.     Cognition  Cognition Arousal/Alertness: Awake/alert Behavior During Therapy: Flat affect Overall Cognitive Status: Impaired/Different from baseline Area of Impairment: Orientation;Attention;Memory;Following commands;Safety/judgement;Awareness;Problem solving Orientation Level: Disoriented to;Place;Time;Situation Current Attention Level: Focused Memory: Decreased recall of precautions Following Commands: Follows one step commands inconsistently;Follows one step commands with increased time Safety/Judgement: Decreased awareness of safety;Decreased awareness of deficits Problem Solving: Slow processing;Decreased initiation;Difficulty sequencing;Requires verbal cues;Requires tactile cues    Balance  Balance Balance Assessed: Yes Static Standing Balance Static Standing - Balance Support: Bilateral upper extremity supported;During functional activity Static Standing - Level of Assistance: 1: +2 Total assist (pt 60%) Static Standing - Comment/# of Minutes: pt able to maintain static stand performing 60%.  cues for upright posture and attending to task.    End of Session PT - End of Session Equipment Utilized During Treatment: Gait belt Activity Tolerance:  (Limited by cognition) Patient left: in chair;with call bell/phone within reach;with chair alarm set Nurse  Communication: Mobility status   GP  Sunny Schlein, [place] [phone] 10/06/2012, 9:37 AM

## 2012-10-06 NOTE — Progress Notes (Addendum)
[facility] [place]  Post-op  Intra-abdominal Surgery note  Date of Surgery:  09/21/2012 - AAA repair Darrick Penna, MD   09/30/2012  Surgeon(s): Fransisco Hertz, MD  6 Days Post-Op Procedure(s): BYPASS GRAFT FEMORAL-POPLITEAL ARTERY  History of Present Illness  Charles [name] is a 67 y.o. male who is  up s/p ruptured AAA repair and right BYPASS GRAFT FEMORAL-POPLITEAL ARTERY  Pt is doing well. Still mildly confused. Oriented to self only tol soft diet  Significant Diagnostic Studies: CBC Lab Results  Component Value Date   WBC 6.7 10/06/2012   HGB 9.0* 10/06/2012   HCT 25.4* 10/06/2012   MCV 84.7 10/06/2012   PLT 217 10/06/2012    BMET    Component Value Date/Time   NA 136 10/06/2012 0411   K 3.5 10/06/2012 0411   CL 102 10/06/2012 0411   CO2 23 10/06/2012 0411   GLUCOSE 83 10/06/2012 0411   BUN 20 10/06/2012 0411   CREATININE 0.67 10/06/2012 0411   CALCIUM 7.6* 10/06/2012 0411   GFRNONAA >90 10/06/2012 0411   GFRAA >90 10/06/2012 0411    I/O last 3 completed shifts: In: 1145 [P.O.:120; NG/GT:975; IV Piggyback:50] Out: 1910 [Urine:1810; Stool:100] Total I/O In: -  Out: 350 [Urine:350]  Physical Examination BP Readings from Last 3 Encounters:  10/06/12 157/99  10/06/12 157/99  10/06/12 157/99   Temp Readings from Last 3 Encounters:  10/06/12 97.4 F (36.3 C) Oral  10/06/12 97.4 F (36.3 C) Oral  10/06/12 97.4 F (36.3 C) Oral   SpO2 Readings from Last 3 Encounters:  10/06/12 100%  10/06/12 100%  10/06/12 100%   Pulse Readings from Last 3 Encounters:  10/06/12 47  10/06/12 47  10/06/12 47    General: A&O x 1,  Pulmonary: normal non-labored breathing , without Rales, rhonchi,  wheezing Cardiac: Heart rate : irregular ,  Abdomen:abdomen soft, non-tender and normal active bowel sounds Abdominal wound:clean, dry, intact  Speech is fluent/normal  Vascular Exam:BLE warm and well perfused Extremities without ischemic changes, no Gangrene, no cellulitis; no  open wounds;   Assessment/Plan: Charles [name] is a 67 y.o. male who is 15 days S/P AAA repair and 6 Days Post-Op BYPASS GRAFT FEMORAL-POPLITEAL ARTERY Acute blood loss anemia - stable Malnutrition - starting to eat, off TF   ROCZNIAK,REGINA J  10/06/2012 9:59 AM  Still confused taking some PO Continue PT/OT Consider transfer to 2000 tomorrow Hopefully Rehab soon D/c half of staples today  Fabienne Bruns, MD [facility] [place] Office: [phone] Pager: [phone]

## 2012-10-06 NOTE — Progress Notes (Signed)
Rehab admissions - Evaluated for possible admission.  Noted patient confused.  I have attempted to call the family to discuss rehab options.  I will update all after I confer with his family.  Call me for questions.  #[phone]

## 2012-10-06 NOTE — Progress Notes (Signed)
PULMONARY  / CRITICAL CARE MEDICINE  Name: RYLEN SWINDLER MRN: [id number] DOB: Sep 09, 1945    ADMISSION DATE:  09/21/2012 CONSULTATION DATE:  09/22/12  REFERRING MD :  Vascular PRIMARY SERVICE: Vascular  CHIEF COMPLAINT:  Post op respiratory failure  BRIEF PATIENT DESCRIPTION: 67 yo with h/o ETOH transferred for AP on 4/22 with ruptured AAA requiring surgical repair.  Post op course complicated by VDRF, ARDS and Salmonella bacteremia and the need for R aorto-femoral bypass.   SIGNIFICANT EVENTS:  4/23 - OR for AAA repair, Aorto left common iliac right common femoral bypass,aortic endarterectomy, right femoral endarterectomy profundaplasty, placement abdominal VAC 4/24 - Off pressors, tolerating pressure support, TNA started 4/26 Echo 09/25/12 EF 35% to40%. Diffuse hypokinesis. Aortic valve: Mild to moderate regurgitation. Mitral valve: Calcified annulus. Mildly thickened leaflets. Moderate regurgitation. Left atrium: The atrium was mildly dilated. Right atrium: The atrium was mildly dilated. Pulmonary arteries: Systolic pressure was severely increased. PA peak pressure: 71mm Hg (S). 5/01 - R aorto-femoral bypass 5/6 trf to SDU   STUDIES: 4/23 CT abd/pelvis>>> rupturing AAA  LINES / TUBES: ETT 4/23>>> 5/5 R IJ PA cath 4/23>>> 4/24 R radial artery 4/23>>> 5/30 Rt IJ CVL 4/24 >>   CULTURES: blood 4/23 >>> Salmonella  Urine 4/23 >>  Salmonella Blood 4/29 >> NEG Blood 5/3 >>  Resp 5/4 >> few candida C diff 5/4 >> NEG  ANTIBIOTICS: Ancef 4/22>>>4/23 Zosyn 4/23>>> 4/29 Ceftriaxone 4/29 >>  Flagyl IV 4/29 >> 5/2  SUBJECTIVE / INTERVAL EVENTS:  Less agitated. Better oriented. No distress  VITAL SIGNS: Temp:  [97.4 F (36.3 C)-99.6 F (37.6 C)] 98.3 F (36.8 C) (05/07 1217) Pulse Rate:  [47-103] 47 (05/07 0747) Resp:  [21-30] 30 (05/07 0747) BP: (143-157)/(72-99) 157/99 mmHg (05/07 0747) SpO2:  [97 %-100 %] 100 % (05/07 0747)  HEMODYNAMICS:    VENTILATOR SETTINGS:     INTAKE / OUTPUT: Intake/Output     05/06 0701 - 05/07 0700 05/07 0701 - 05/08 0700   P.O. 120    I.V. (mL/kg)     Other     NG/GT 195    IV Piggyback 50    TPN     Total Intake(mL/kg) 365 (6.3)    Urine (mL/kg/hr) 1275 (0.9) 350 (0.9)   Stool 100 (0.1)    Total Output 1375 350   Net -1010 -350          PHYSICAL EXAMINATION: General: NAD Neuro: No focal deficits HEENT: WNL Cardiovascular: RRR Lungs: clear anteriorly Abdomen:soft, NT, + BS Ext: no edema  LABS:  Recent Labs Lab 10/02/12 0621 10/03/12 1049  PHART 7.447 7.389  PCO2ART 43.2 36.1  PO2ART 78.0* 67.0*  HCO3 29.8* 21.6  TCO2 31 23  O2SAT 96.0 92.0    Recent Labs Lab 09/30/12 0345 10/01/12 0355 10/02/12 0443 10/03/12 0335 10/04/12 0410 10/06/12 0411  NA 138 138 137 134* 134* 136  K 3.3* 3.8 3.6 3.8 3.8 3.5  CL 95* 98 99 103 103 102  CO2 38* 36* 33* 25 23 23   GLUCOSE 124* 124* 119* 121* 158* 83  BUN 30* 32* 29* 28* 27* 20  CREATININE 0.70 0.72 0.67 0.74 0.63 0.67  CALCIUM 8.7 8.2* 7.9* 8.0* 7.8* 7.6*  MG 1.9 1.9  --  2.0 1.9  --   PHOS 4.0 4.1  --  4.3  --   --     Recent Labs Lab 10/03/12 0335 10/04/12 0410 10/06/12 0411  HGB 9.3* 9.0* 9.0*  HCT 28.0* 26.6* 25.4*  WBC 10.8* 8.1 6.7  PLT 140* 153 217    Recent Labs Lab 09/30/12 0345 09/30/12 0346 10/04/12 0410  AST 62*  --  41*  ALT 37  --  20  ALKPHOS 77  --  57  BILITOT 1.1  --  0.5  PROT 6.3  --  5.9*  ALBUMIN 1.8*  --  1.6*  INR  --  1.16  --     Recent Labs Lab 10/02/12 1618 10/03/12 0037 10/03/12 0833 10/04/12 0729 10/04/12 1215  GLUCAP 106* 135* 117* 133* 117*   CXR: no new film    ASSESSMENT / PLAN:  PULMONARY A: Acute post op respiratory failure., resolved Hx of smoking, presumed COPD.  Pulmonary edema, less likely ARDS - improved.   Pulmonary hypertension. P:   Airway hygiene Cont PRN BDs  CARDIOVASCULAR A: Ruptured AAA s/p repair.  S/p R aorto-femoral bypass.  HTN. PVD. P:  Per  Vascular Surgery Hydralazine, Lopressor, Labetalol PRN  RENAL A:  Hypokalemia - resolved.    P:   Cont IVFs Correct electrolytes as indicated  GASTROINTESTINAL A:  Severe protein calorie malnutrition.  Hyperbilirubinemia - resolved.   P:   Advance POs as tolerated  HEMATOLOGIC A: Acute blood loss anemia - none further. Thrombocytopenia - resolved P:  Trend CBC  INFECTIOUS A:  Salmonella UTI and bacteremia. P:   ID following Antibiotics / cultures as above  ENDOCRINE A:  Hyperglycemia, resolved P:   No need for SSI   NEUROLOGIC A:  Acute encephalopathy, much improved Hx of EtOH abuse P:   Change scheduled Haldol to quetiepine Cont PRN Haldol and Ativan   Watch in SDU X 24 hrs more   Billy Fischer, MD ; [facility] [phone].  After 5:30 PM or weekends, call [phone]

## 2012-10-06 NOTE — Progress Notes (Signed)
Foley d/c'd at 1130 per MD order, pt tolerated procedure well. Condom cath applied at that time. Will continue to monitor

## 2012-10-06 NOTE — Progress Notes (Signed)
Calorie Count Note  48 hour calorie count ordered yesterday.  Diet: Regular Supplements: Ensure Complete BID; Prostat 30 ml BID  Dinner (5/6): 25 kcals, 1 gm protein Breakfast (5/7): 0 kcals, 0 gm protein Lunch (5/7): 50 kcals, 3 gm protein  Supplements: 150 kcals, 5 gm protein  Total intake: 225 kcal (15% of minimum estimated needs)  9 gm protein (10% of minimum estimated needs)  Nutrition Dx: Increased nutrient needs related to post-op state as evidenced by estimated nutrition needs, ongoing.  Goal: Oral intake with meals & supplements to meet >/= 90% of estimated nutrition needs, currently unmet.  Intervention:   Continue Ensure Complete BID and Prostat 30 ml BID to maximize oral intake.  Continue calorie count. RD to document final results tomorrow.  Joaquin Courts, RD, LDN, CNSC Pager [phone] After Hours Pager [phone]

## 2012-10-07 LAB — CULTURE, RESPIRATORY W GRAM STAIN

## 2012-10-07 NOTE — Progress Notes (Signed)
Rehab admissions - I spoke with daughter yesterday.  Family leaning towards SNF placement for rehab.  Patient's nephew did stay with him, but family concerned about the need for 24 hr supervision after rehab stay.  Family all work.  Also they feel it will be hard to manage patient at home.  Patient with continued confusion.  Daughter meeting with family to determine discharge plans.  Would prepare for SNF in light of conversation with daughter.  Call me for questions.  #[phone]

## 2012-10-07 NOTE — Progress Notes (Signed)
Clinical Child psychotherapist received referral for SNF at [facility].  CSW reviewed chart; no family at bedside and pt not fully oriented.  CSW left voicemail message with son. CSW prepared paperwork for SNF search; full assessment to follow.    Angelia Mould, MSW, [place] [phone]

## 2012-10-07 NOTE — Progress Notes (Addendum)
[facility] Progress Note  10/07/2012 7:56 AM POD 16   Subjective:  Confused this am.  Not oriented to year or president. Does not complain of pain  Afebrile VSS 99% RA  Filed Vitals:   10/07/12 0400  BP: 153/87  Pulse: 83  Temp: 97.8 F (36.6 C)  Resp: 18    Physical Exam: Cardiac:  irregular Lungs:  CTAB Abdomen:  Soft NT/ND-flexi seal still in place for some diarrhea Incisions:  Abdominal and right groin c/d/i Extremities:  + palpable DP pulses bilaterally  CBC    Component Value Date/Time   WBC 6.7 10/06/2012 0411   RBC 3.00* 10/06/2012 0411   HGB 9.0* 10/06/2012 0411   HCT 25.4* 10/06/2012 0411   PLT 217 10/06/2012 0411   MCV 84.7 10/06/2012 0411   MCH 30.0 10/06/2012 0411   MCHC 35.4 10/06/2012 0411   RDW 14.9 10/06/2012 0411   LYMPHSABS 1.5 10/04/2012 0410   MONOABS 0.6 10/04/2012 0410   EOSABS 0.0 10/04/2012 0410   BASOSABS 0.0 10/04/2012 0410    BMET    Component Value Date/Time   NA 136 10/06/2012 0411   K 3.5 10/06/2012 0411   CL 102 10/06/2012 0411   CO2 23 10/06/2012 0411   GLUCOSE 83 10/06/2012 0411   BUN 20 10/06/2012 0411   CREATININE 0.67 10/06/2012 0411   CALCIUM 7.6* 10/06/2012 0411   GFRNONAA >90 10/06/2012 0411   GFRAA >90 10/06/2012 0411    INR    Component Value Date/Time   INR 1.16 09/30/2012 0346     Intake/Output Summary (Last 24 hours) at 10/07/12 0756 Last data filed at 10/07/12 0400  Gross per 24 hour  Intake    240 ml  Output   1675 ml  Net  -1435 ml     Assessment/Plan:  67 y.o. male is s/p repair ruptured AAA  POD 16  -pt doing well from surgical standpoint, but still confused. -? AFib-pt on lovenox for DVT prophylaxis -continue calorie count and encourage po intake -pt has condom cath in place and functioning well-good UOP -possible transfer to 2000   Doreatha Massed, PA-C [facility] [phone] 10/07/2012 7:56 AM  Agree with above.  Transfer to 2000.  Ready for Rehab from my standpoint at anytime they  feel he is ready.  May still need some titration of BP meds but this can be done as outpt.  D/c central line.  D/c remainder of staples  Fabienne Bruns, MD [facility] [place] Office: [phone] Pager: [phone]

## 2012-10-07 NOTE — Progress Notes (Signed)
NUTRITION FOLLOW UP  Intervention:    D/C calorie count; continue Ensure BID and Prostat 30 ml BID   Consider short-term enteral nutrition support re-initiation RD to follow for nutrition care plan  Nutrition Dx:   Increased nutrient needs related to post-op state as evidenced by estimated nutrition needs, ongoing  Goal:   Oral intake with meals & supplements to meet >/= 90% of estimated nutrition needs, unmet  Monitor:   PO & supplemental intake, nutrition support re-initiation, weight, labs, I/O's  Assessment:   Patient s/p procedures 4/22:  AORTO LEFT COMMON ILIAC RIGHT COMMON FEMORAL BYPASS  AORTIC ENDARTERECTOMY  RIGHT FEMORAL ENDARTERECTOMY PROFUNDAPLASTY  PLACEMENT OF ABDOMINAL WOUND VAC  48 hour calorie count ordered 5/5.  Patient transferred from 2300 to 2000 this afternoon.  No calorie count envelope with meal tickets available.  RD visited patient while daughter trying to feed him lunch; taking bites.  Height: Ht Readings from Last 1 Encounters:  09/21/12 5\' 4"  (1.626 m)    Weight Status:   Wt Readings from Last 1 Encounters:  10/05/12 126 lb 15.8 oz (57.6 kg)    Re-estimated needs:  Kcal: 1500-1700 Protein: 85-100 gm Fluid: 1.5-1.7 L  Skin: abdominal wound VAC  Diet Order: General   Intake/Output Summary (Last 24 hours) at 10/07/12 1546 Last data filed at 10/07/12 0800  Gross per 24 hour  Intake    120 ml  Output   1475 ml  Net  -1355 ml    Last BM: 5/6  Labs:   Recent Labs Lab 10/01/12 0355  10/03/12 0335 10/04/12 0410 10/06/12 0411  NA 138  < > 134* 134* 136  K 3.8  < > 3.8 3.8 3.5  CL 98  < > 103 103 102  CO2 36*  < > 25 23 23   BUN 32*  < > 28* 27* 20  CREATININE 0.72  < > 0.74 0.63 0.67  CALCIUM 8.2*  < > 8.0* 7.8* 7.6*  MG 1.9  --  2.0 1.9  --   PHOS 4.1  --  4.3  --   --   GLUCOSE 124*  < > 121* 158* 83  < > = values in this interval not displayed.   Scheduled Meds: . antiseptic oral rinse  15 mL Mouth Rinse QID  .  cefTRIAXone (ROCEPHIN)  IV  2 g Intravenous Q24H  . chlorhexidine  15 mL Mouth Rinse BID  . enoxaparin (LOVENOX) injection  40 mg Subcutaneous Q24H  . feeding supplement  237 mL Oral BID BM  . metoprolol tartrate  50 mg Oral BID  . QUEtiapine  50 mg Oral QHS  . sodium chloride  10-40 mL Intracatheter Q12H    Continuous Infusions:   Maureen Chatters, RD, LDN Pager #: [phone] After-Hours Pager #: [phone]

## 2012-10-07 NOTE — Progress Notes (Addendum)
PULMONARY  / CRITICAL CARE MEDICINE  Name: Charles [name] MRN: [id number] DOB: December 13, 1945    ADMISSION DATE:  09/21/2012 CONSULTATION DATE:  09/22/12  REFERRING MD :  Vascular PRIMARY SERVICE: Vascular  CHIEF COMPLAINT:  Post op respiratory failure  BRIEF PATIENT DESCRIPTION: 67 yo with h/o ETOH transferred for AP on 4/22 with ruptured AAA requiring surgical repair.  Post op course complicated by VDRF, ARDS and Salmonella bacteremia and the need for R aorto-femoral bypass.   SIGNIFICANT EVENTS:  4/23 - OR for AAA repair, Aorto left common iliac right common femoral bypass,aortic endarterectomy, right femoral endarterectomy profundaplasty, placement abdominal VAC 4/24 - Off pressors, tolerating pressure support, TNA started 4/26 Echo 09/25/12 EF 35% to40%. Diffuse hypokinesis. Aortic valve: Mild to moderate regurgitation. Mitral valve: Calcified annulus. Mildly thickened leaflets. Moderate regurgitation. Left atrium: The atrium was mildly dilated. Right atrium: The atrium was mildly dilated. Pulmonary arteries: Systolic pressure was severely increased. PA peak pressure: 71mm Hg (S). 5/01 - R aorto-femoral bypass 5/6 trf to SDU   STUDIES: 4/23 CT abd/pelvis>>> rupturing AAA  LINES / TUBES: ETT 4/23>>> 5/5 R IJ PA cath 4/23>>> 4/24 R radial artery 4/23>>> 5/30 Rt IJ CVL 4/24 >> 5/8  CULTURES: blood 4/23 >>> Salmonella  Urine 4/23 >>  Salmonella Blood 4/29 >> NEG Blood 5/3 >>  Resp 5/4 >> few candida C diff 5/4 >> NEG  ANTIBIOTICS: Ancef 4/22>>>4/23 Zosyn 4/23>>> 4/29 Ceftriaxone 4/29 >>  Flagyl IV 4/29 >> 5/2  SUBJECTIVE / INTERVAL EVENTS:  Less agitated. Better oriented. No distress  VITAL SIGNS: Temp:  [97.7 F (36.5 C)-98.9 F (37.2 C)] 97.7 F (36.5 C) (05/08 1410) Pulse Rate:  [66-96] 66 (05/08 1410) Resp:  [16-30] 20 (05/08 1410) BP: (146-156)/(76-91) 146/86 mmHg (05/08 1410) SpO2:  [94 %-100 %] 96 % (05/08 1410)  HEMODYNAMICS:    VENTILATOR SETTINGS:     INTAKE / OUTPUT: Intake/Output     05/07 0701 - 05/08 0700 05/08 0701 - 05/09 0700   P.O. 240 120   NG/GT     IV Piggyback     Total Intake(mL/kg) 240 (4.2) 120 (2.1)   Urine (mL/kg/hr) 1675 (1.2) 400 (0.8)   Stool     Total Output 1675 400   Net -1435 -280          PHYSICAL EXAMINATION: General: NAD Neuro: No focal deficits HEENT: WNL Cardiovascular: RRR Lungs: clear anteriorly Abdomen:soft, NT, + BS Ext: no edema  LABS:  Recent Labs Lab 10/02/12 0621 10/03/12 1049  PHART 7.447 7.389  PCO2ART 43.2 36.1  PO2ART 78.0* 67.0*  HCO3 29.8* 21.6  TCO2 31 23  O2SAT 96.0 92.0    Recent Labs Lab 10/01/12 0355 10/02/12 0443 10/03/12 0335 10/04/12 0410 10/06/12 0411  NA 138 137 134* 134* 136  K 3.8 3.6 3.8 3.8 3.5  CL 98 99 103 103 102  CO2 36* 33* 25 23 23   GLUCOSE 124* 119* 121* 158* 83  BUN 32* 29* 28* 27* 20  CREATININE 0.72 0.67 0.74 0.63 0.67  CALCIUM 8.2* 7.9* 8.0* 7.8* 7.6*  MG 1.9  --  2.0 1.9  --   PHOS 4.1  --  4.3  --   --     Recent Labs Lab 10/03/12 0335 10/04/12 0410 10/06/12 0411  HGB 9.3* 9.0* 9.0*  HCT 28.0* 26.6* 25.4*  WBC 10.8* 8.1 6.7  PLT 140* 153 217    Recent Labs Lab 10/04/12 0410  AST 41*  ALT 20  ALKPHOS 57  BILITOT 0.5  PROT 5.9*  ALBUMIN 1.6*    Recent Labs Lab 10/02/12 1618 10/03/12 0037 10/03/12 0833 10/04/12 0729 10/04/12 1215  GLUCAP 106* 135* 117* 133* 117*   CXR: no new film    ASSESSMENT / PLAN:  PULMONARY A: Acute post op respiratory failure., resolved Hx of smoking, presumed COPD.  Pulmonary edema, less likely ARDS - improved.   Pulmonary hypertension. P:   Airway hygiene Cont PRN BDs  CARDIOVASCULAR A: Ruptured AAA s/p repair.  S/p R aorto-femoral bypass.  HTN. PVD. P:  Per Vascular Surgery Hydralazine, Lopressor, Labetalol PRN  RENAL A:  Hypokalemia - resolved.    P:   Cont IVFs Correct electrolytes as indicated  GASTROINTESTINAL A:  Severe protein calorie  malnutrition.  Hyperbilirubinemia - resolved.   P:   Advance POs as tolerated  HEMATOLOGIC A: Acute blood loss anemia - none further. Thrombocytopenia - resolved P:  Trend CBC  INFECTIOUS A:  Salmonella UTI and bacteremia. P:   ID following Antibiotics / cultures as above Will need 6 wks total of IV abx  CVL to be DC'd 5/8. Will likely need PICC but would wait until a little less delirious  ENDOCRINE A:  Hyperglycemia, resolved P:   No need for SSI   NEUROLOGIC A:  Acute encephalopathy, much improved Hx of EtOH abuse P:   Cont quetiepine Cont PRN Haldol and Ativan   Agree with transfer to med-surg as ordered by VVS. TRH to resume med mgmt as of 5/9 and PPCM to sign off   Billy Fischer, MD ; [facility] [phone].  After 5:30 PM or weekends, call [phone]

## 2012-10-08 ENCOUNTER — Inpatient Hospital Stay (HOSPITAL_COMMUNITY)
Admission: RE | Admit: 2012-10-08 | Discharge: 2012-10-19 | DRG: 945 | Disposition: A | Discharge status: Skilled Nursing Facility | Payer: Medicare Other | Source: Intra-hospital | Attending: Physical Medicine & Rehabilitation | Admitting: Physical Medicine & Rehabilitation

## 2012-10-08 DIAGNOSIS — I713 Abdominal aortic aneurysm, ruptured, unspecified: Secondary | ICD-10-CM | POA: Diagnosis present

## 2012-10-08 DIAGNOSIS — R5381 Other malaise: Secondary | ICD-10-CM

## 2012-10-08 DIAGNOSIS — G9341 Metabolic encephalopathy: Secondary | ICD-10-CM

## 2012-10-08 DIAGNOSIS — R7881 Bacteremia: Secondary | ICD-10-CM | POA: Diagnosis present

## 2012-10-08 DIAGNOSIS — D62 Acute posthemorrhagic anemia: Secondary | ICD-10-CM | POA: Diagnosis present

## 2012-10-08 DIAGNOSIS — R627 Adult failure to thrive: Secondary | ICD-10-CM

## 2012-10-08 DIAGNOSIS — R410 Disorientation, unspecified: Secondary | ICD-10-CM

## 2012-10-08 DIAGNOSIS — R531 Weakness: Secondary | ICD-10-CM

## 2012-10-08 MED ORDER — LORAZEPAM 2 MG/ML IJ SOLN
0.5000 mg | Freq: Once | INTRAMUSCULAR | Status: AC
  Administered 2012-10-08: 0.5 mg via INTRAVENOUS
  Filled 2012-10-08: qty 1

## 2012-10-08 MED ORDER — PROCHLORPERAZINE EDISYLATE 5 MG/ML IJ SOLN
5.0000 mg | Freq: Four times a day (QID) | INTRAMUSCULAR | Status: DC | PRN
  Filled 2012-10-08: qty 2

## 2012-10-08 MED ORDER — PROCHLORPERAZINE 25 MG RE SUPP
12.5000 mg | Freq: Four times a day (QID) | RECTAL | Status: DC | PRN
  Filled 2012-10-08: qty 1

## 2012-10-08 MED ORDER — ENOXAPARIN SODIUM 40 MG/0.4ML ~~LOC~~ SOLN
40.0000 mg | SUBCUTANEOUS | Status: DC
  Administered 2012-10-08 – 2012-10-18 (×11): 40 mg via SUBCUTANEOUS
  Filled 2012-10-08 (×12): qty 0.4

## 2012-10-08 MED ORDER — POLYETHYLENE GLYCOL 3350 17 G PO PACK
17.0000 g | PACK | Freq: Every day | ORAL | Status: DC | PRN
  Filled 2012-10-08: qty 1

## 2012-10-08 MED ORDER — QUETIAPINE FUMARATE ER 50 MG PO TB24
50.0000 mg | ORAL_TABLET | Freq: Every day | ORAL | Status: DC
  Administered 2012-10-09 – 2012-10-18 (×10): 50 mg via ORAL
  Filled 2012-10-08 (×12): qty 1

## 2012-10-08 MED ORDER — BISACODYL 10 MG RE SUPP: 10.0000 mg | Freq: Every day | RECTAL | Status: DC | PRN

## 2012-10-08 MED ORDER — ACETAMINOPHEN 325 MG PO TABS
325.0000 mg | ORAL_TABLET | ORAL | Status: DC | PRN
  Administered 2012-10-14: 650 mg via ORAL
  Filled 2012-10-08: qty 2

## 2012-10-08 MED ORDER — ALUM & MAG HYDROXIDE-SIMETH 200-200-20 MG/5ML PO SUSP: 30.0000 mL | ORAL | Status: DC | PRN

## 2012-10-08 MED ORDER — CEFTRIAXONE SODIUM 2 G IJ SOLR
2.0000 g | INTRAMUSCULAR | Status: DC
  Administered 2012-10-08 – 2012-10-18 (×11): 2 g via INTRAVENOUS
  Filled 2012-10-08 (×13): qty 2

## 2012-10-08 MED ORDER — FLEET ENEMA 7-19 GM/118ML RE ENEM: 1.0000 | ENEMA | Freq: Once | RECTAL | Status: AC | PRN

## 2012-10-08 MED ORDER — LORAZEPAM 2 MG/ML IJ SOLN
0.5000 mg | INTRAMUSCULAR | Status: DC | PRN
  Administered 2012-10-09: 0.5 mg via INTRAVENOUS
  Filled 2012-10-08: qty 1

## 2012-10-08 MED ORDER — DEXTROSE 5 % IV SOLN: 2.0000 g | INTRAVENOUS | Status: DC

## 2012-10-08 MED ORDER — METOPROLOL TARTRATE 50 MG PO TABS
50.0000 mg | ORAL_TABLET | Freq: Two times a day (BID) | ORAL | Status: DC
  Administered 2012-10-09 – 2012-10-19 (×20): 50 mg via ORAL
  Filled 2012-10-08 (×25): qty 1

## 2012-10-08 MED ORDER — PROCHLORPERAZINE MALEATE 5 MG PO TABS
5.0000 mg | ORAL_TABLET | Freq: Four times a day (QID) | ORAL | Status: DC | PRN
  Filled 2012-10-08: qty 2

## 2012-10-08 MED ORDER — ALBUTEROL SULFATE (5 MG/ML) 0.5% IN NEBU: 2.5000 mg | INHALATION_SOLUTION | RESPIRATORY_TRACT | Status: DC | PRN

## 2012-10-08 MED ORDER — OXYCODONE-ACETAMINOPHEN 5-325 MG PO TABS
1.0000 | ORAL_TABLET | ORAL | Status: DC | PRN
  Administered 2012-10-09: 2 via ORAL
  Filled 2012-10-08: qty 2

## 2012-10-08 MED ORDER — LORAZEPAM 2 MG/ML IJ SOLN
0.5000 mg | Freq: Four times a day (QID) | INTRAMUSCULAR | Status: DC | PRN
  Administered 2012-10-08: 0.5 mg via INTRAMUSCULAR
  Filled 2012-10-08: qty 1

## 2012-10-08 MED ORDER — CHLORHEXIDINE GLUCONATE 0.12 % MT SOLN
15.0000 mL | Freq: Two times a day (BID) | OROMUCOSAL | Status: DC
  Administered 2012-10-09 – 2012-10-19 (×17): 15 mL via OROMUCOSAL
  Filled 2012-10-08 (×24): qty 15

## 2012-10-08 MED ORDER — BIOTENE DRY MOUTH MT LIQD
15.0000 mL | Freq: Four times a day (QID) | OROMUCOSAL | Status: DC
  Administered 2012-10-09 – 2012-10-19 (×21): 15 mL via OROMUCOSAL

## 2012-10-08 MED ORDER — SACCHAROMYCES BOULARDII 250 MG PO CAPS
250.0000 mg | ORAL_CAPSULE | Freq: Two times a day (BID) | ORAL | Status: DC
  Administered 2012-10-09 – 2012-10-19 (×21): 250 mg via ORAL
  Filled 2012-10-08 (×25): qty 1

## 2012-10-08 MED ORDER — LORAZEPAM 2 MG/ML IJ SOLN: 0.5000 mg | INTRAMUSCULAR | Status: DC

## 2012-10-08 MED ORDER — DIPHENHYDRAMINE HCL 12.5 MG/5ML PO ELIX
12.5000 mg | ORAL_SOLUTION | Freq: Four times a day (QID) | ORAL | Status: DC | PRN
  Filled 2012-10-08: qty 10

## 2012-10-08 MED ORDER — LORAZEPAM 0.5 MG PO TABS: 0.5000 mg | ORAL_TABLET | Freq: Four times a day (QID) | ORAL | Status: DC | PRN

## 2012-10-08 MED ORDER — GUAIFENESIN-DM 100-10 MG/5ML PO SYRP: 5.0000 mL | ORAL_SOLUTION | Freq: Four times a day (QID) | ORAL | Status: DC | PRN

## 2012-10-08 NOTE — Progress Notes (Signed)
Pt admitted to rehab unit at 1515. Pt alert and oriented to person only with confusion and agitation. Pt unable to stay in bed with pt put in recliner and sat at nursing station. Pt has poor processing and awareness.  Rehab booklet left at bedside waiting for family to arrive to review. RN will be notified. Pt calm at nursing station at this time.

## 2012-10-08 NOTE — Clinical Social Work Note (Signed)
Clinical Social Worker received referral for possible ST-SNF placement.  Chart reviewed.  PT/OT recommending inpatient rehab.  Spoke with inpatient rehab admissions coordinator who will follow up with patient to discuss inpatient rehab admission today.   CSW signing off - please re consult if social work needs arise.  Macario Golds, [place] [phone]

## 2012-10-08 NOTE — Plan of Care (Signed)
Overall Plan of Care [facility]) Patient Details Name: Charles [name] MRN: [id number] DOB: 1945/10/20  Diagnosis:  deconditioning  Co-morbidities: recent abdominal aortic aneurysm repair, Salmonella bacteremia, chronic malnutrition  Functional Problem List  Patient demonstrates impairments in the following areas: Balance, Bladder, Bowel, Cognition, Edema, Medication Management, Nutrition, Perception, Safety, Sensory  and Skin Integrity  Basic ADL's: eating, grooming, bathing, dressing and toileting   Transfers:  bed mobility, bed to chair, toilet, tub/shower, car, furniture and floor Locomotion:  ambulation, wheelchair mobility and stairs  Additional Impairments:  Swallowing, Communication  comprehension and expression and Social Cognition   social interaction, problem solving, memory, attention and awareness  Anticipated Outcomes Item Anticipated Outcome  Eating/Swallowing  Supervision   Basic self-care  Supervision  Tolieting  Supervision  Bowel/Bladder  Pt will be continent of bowel and bladder with minimal assist  Transfers  Supervision: toilet and tub/shower and basic  Locomotion  Supervision 150' with RW  Communication  Supervision   Cognition  Supervision-Min assist  Pain  3 or less  Safety/Judgment  24/7 Supervision   Other     Therapy Plan: PT Intensity: Minimum of 1-2 x/day ,45 to 90 minutes PT Duration Estimated Length of Stay: 14 days OT Intensity: Minimum of 1-2 x/day, 45 to 90 minutes OT Frequency: 5 out of 7 days OT Duration/Estimated Length of Stay: 2 weeks SLP Intensity: Minumum of 1-2 x/day, 30 to 90 minutes SLP Frequency: 5 out of 7 days SLP Duration/Estimated Length of Stay: 2 weeks    Team Interventions: Item RN PT OT SLP SW TR Other  Self Care/Advanced ADL Retraining   x      Neuromuscular Re-Education  x       Therapeutic Activities  x x x     UE/LE Strength Training/ROM  x x      UE/LE Coordination Activities  x x       Visual/Perceptual Remediation/Compensation         DME/Adaptive Equipment Instruction  x x      Therapeutic Exercise  x x      Balance/Vestibular Training  x x      Patient/Family Education x x x x     Cognitive Remediation/Compensation  x x x     Functional Mobility Training  x x      Ambulation/Gait Training  x       Stair Training  x       Wheelchair Propulsion/Positioning  x       Functional Tourist information centre manager Reintegration  x x      Dysphagia/Aspiration Printmaker    x     Speech/Language Facilitation    x     Bladder Management x        Bowel Management x        Disease Management/Prevention x x       Pain Management x x x      Medication Management x   x     Skin Care/Wound Management x        Splinting/Orthotics         Discharge Planning  x x x     Psychosocial Support                                Team Discharge Planning: Destination: PT-Home ,OT-  (TBD) , SLP-Home Projected Follow-up: PT-Home health PT, OT-  24 hour supervision/assistance, SLP-24 hour supervision/assistance;Outpatient SLP Projected Equipment Needs: PT-Rolling walker with 5" wheels, OT-  (TBD), SLP-None recommended by SLP Patient/family involved in discharge planning: PT- Patient unable/family or caregiver not available,  OT-Patient, SLP-Patient;Family member/caregiver  MD ELOS: 2 weeks Medical Rehab Prognosis:  Fair Assessment: 66 year old male admitted for severe deconditioning after abdominal aortic aneurysm repair and postoperative delirium.patient now requires 3 7 rehabilitation RN and M.D. Monitor nutritional status as well as fluid status. Also requiring CIR level PT/OT/SLP for mobility, ADLs, condition, swallowing. Treatment team will focus on deficit areas with goals of supervision. Also goals getting out of enclosure bed.    See Team Conference Notes for weekly updates to the plan of care

## 2012-10-08 NOTE — H&P (Signed)
Physical Medicine and Rehabilitation Admission H&P    Chief Complaint  Patient presents with  . Deconditioning. Metabolic encephalopathy.   : HPI:  Charles [name] is a 67 y.o. male with two month history of abdominal pain, low back pain with LLE weakness with problems walking and difficulty with oral intake. He was admitted via [facility] on 09/22/12 for treatment of ruptured AAA with hemorrhage extending into left iliopsoas muscle. He was taken to OR emergently for AAA repair with left common iliac to right common femoral bypass, aortic endarterectomy, right femoral endarterectomy profundaplasty, placement abdominal VAC. Placed on IV zosyn. Abdominal wound closure done on 04/25. BC/UCS positive for salmonella and ID consulted for input on treatment course. Dr. 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   2D echo with diffuse hypokinesis with EF 35-40%. Follow up BC negative. Patient developed RLE ischemia requiring Right aortic graft limb to below knee bypass on 05/01. Has had confusion due to encephalopathy limiting extubation. He was extubated on 05/05 and continues to have issues with confusion/paranoia. Has had signifcant diarrhea requring rectal tube. C-diff check 5/4 negative. Diet advanced to  Regular. Therapies initiated and patient needs multiple cues for basic tasks,is deconditioned and working on pre-gait activities. . Therapy team recommending CIR.    Review of Systems  Unable to perform ROS: mental acuity   Past Medical History  Diagnosis Date  . Hypercholesteremia   . Collapsed lung     s/p fall, rib fracture   Past Surgical History  Procedure Laterality Date  . Hernia repair    . Pleural scarification      collapsed lung  . Abdominal aortic aneurysm repair N/A 09/21/2012    Procedure: ANEURYSM ABDOMINAL AORTIC REPAIR;  Surgeon: Sherren Kerns, MD;  Location: [facility] OR;  Service: Vascular;  Laterality:  N/A;  . Patch angioplasty  09/21/2012    Procedure: PATCH ANGIOPLASTY;  Surgeon: Sherren Kerns, MD;  Location: [facility] OR;  Service: Vascular;;  [place] Patch Angioplasty fo Right Common Femoral Artery  . Embolectomy Right 09/21/2012    Procedure: EMBOLECTOMY;  Surgeon: Sherren Kerns, MD;  Location: [facility] OR;  Service: Vascular;  Laterality: Right;  Embolectomy of right leg.  . Wound debridement  09/24/2012    Procedure: Removal of Abdominal wound VAC, and Abdominal Closure;  Surgeon: Sherren Kerns, MD;  Location: [facility] OR;  Service: Vascular;;  . Femoral-popliteal bypass graft Right 09/30/2012    Procedure: BYPASS GRAFT FEMORAL-POPLITEAL ARTERY;  Surgeon: Fransisco Hertz, MD;  Location: [facility] OR;  Service: Vascular;  Laterality: Right;   Family History  Problem Relation Age of Onset  . Diabetes Mother   . Diabetes Father    Social History: Lives alone in [place]. He reports that he has been smoking Cigarettes--1/2 PPD? He has been smoking about 0.00 packs per day. He does not have any smokeless tobacco history on file. He reports that drinks alcohol ~ 1/2 pint daily?. He reports that he does not use illicit drugs.    Allergies: No Known Allergies  Medications Prior to Admission  Medication Sig Dispense Refill  . oxyCODONE-acetaminophen (PERCOCET/ROXICET) 5-325 MG per tablet Take 1 tablet by mouth every 4 (four) hours as needed for pain.      . [DISCONTINUED] ibuprofen (ADVIL,MOTRIN) 200 MG tablet Take 400 mg by mouth every 6 (six) hours as needed for pain.        Home: Home Living Lives With:  Family Available Help at Discharge: Family;Available 24 hours/day;Other (Comment) (trying to arrange 24 hour care) Type of Home: House Home Access: Stairs to enter Entrance Stairs-Number of Steps: several Home Layout: One level Home Adaptive Equipment: None Additional Comments: TBA (pt confused and unable to report)   Functional History: Prior Function Able to Take Stairs?: Yes Driving:  (Could  drive, but truck needs repairs.) Vocation: Retired  Functional Status:  Mobility: Bed Mobility Bed Mobility: Right Sidelying to Sit;Rolling Right;Sitting - Scoot to [place] of Bed Rolling Right: 4: Min assist;With rail Rolling Left: 4: Min assist Right Sidelying to Sit: 4: Min assist Left Sidelying to Sit: 4: Min assist Sitting - Scoot to [place] of Bed: 4: Min assist Transfers Transfers: Sit to Stand;Stand to [employer] Transfers Sit to Stand: 1: +2 Total assist;With upper extremity assist;From bed Sit to Stand: Patient Percentage: 50% Stand to Sit: 1: +2 Total assist;With upper extremity assist;To chair/3-in-1;With armrests Stand to Sit: Patient Percentage: 30% Stand Pivot Transfers: 1: +2 Total assist Stand Pivot Transfers: Patient Percentage: 50% Ambulation/Gait Ambulation/Gait Assistance: Not tested (comment) Stairs: No Wheelchair Mobility Wheelchair Mobility: No  ADL: ADL Grooming: Maximal assistance Where Assessed - Grooming: Supine, head of bed up Upper Body Dressing: +1 Total assistance Where Assessed - Upper Body Dressing: Supine, head of bed up Lower Body Dressing: +1 Total assistance Where Assessed - Lower Body Dressing: Supine, head of bed up Transfers/Ambulation Related to ADLs: NT on eval due to lack of +2 assistance ADL Comments: Patient presents confused, needs cues throughout for completion of ADLs.  Cognition: Cognition Overall Cognitive Status: Impaired/Different from baseline Arousal/Alertness: Awake/alert Orientation Level: Oriented to person;Disoriented to time;Oriented to place Cognition Arousal/Alertness: Awake/alert Behavior During Therapy: Flat affect Overall Cognitive Status: Impaired/Different from baseline Area of Impairment: Orientation;Attention;Memory;Following commands;Safety/judgement;Awareness;Problem solving Orientation Level: Disoriented to;Place;Time;Situation Current Attention Level: Focused Memory: Decreased recall of  precautions Following Commands: Follows one step commands inconsistently;Follows one step commands with increased time Safety/Judgement: Decreased awareness of safety;Decreased awareness of deficits Problem Solving: Slow processing;Decreased initiation;Difficulty sequencing;Requires verbal cues;Requires tactile cues  Physical Exam: Blood pressure 167/70, pulse 81, temperature 98.7 F (37.1 C), temperature source Oral, resp. rate 20, height 5\' 4"  (1.626 m), weight 57.6 kg (126 lb 15.8 oz), SpO2 97.00%. Physical Exam  Nursing note and vitals reviewed. Constitutional: He appears cachectic. He appears ill. No distress.  HENT:  Head: Normocephalic and atraumatic.  Eyes: Conjunctivae and EOM are normal. Pupils are equal, round, and reactive to light. Right eye exhibits no discharge. Left eye exhibits no discharge.  Neck: Normal range of motion. No JVD present. No tracheal deviation present. No thyromegaly present.  Cardiovascular: Normal rate.  An irregularly irregular rhythm present.  Pulmonary/Chest: Effort normal and breath sounds normal. No respiratory distress. He has no wheezes.  Abdominal: Soft. Bowel sounds are normal. He exhibits no distension.  Midline incision clean, dry and intact. Well approximated  Musculoskeletal:  Mild pedal edema bilaterally. Healed abrasions bilateral shins.   Lymphadenopathy:    He has no cervical adenopathy.  Neurological: He is alert.   Speech soft, dysarthric. Confused and rambling. Makes eye contact. Oriented to self and able to state age. Place --"[facility]/ [place]". Confabulation with poor recall.  Awareness and insight impaired. Moves all four with inconsistent effort. RUE with tremors. Able to follow basic commands.    Skin: Skin is warm and dry.  Psychiatric:  Limited insight and awareness. Fairly alert    Results for orders placed during the hospital encounter of 09/21/12 (from the  past 48 hour(s))  GLUCOSE, CAPILLARY     Status: None   Collection  Time    10/08/12  6:23 AM      Result Value Range   Glucose-Capillary 76  70 - 99 mg/dL   No results found.  Post Admission Physician Evaluation: 1. Functional deficits secondary  to deconditioning, encephalopathy after AAA repair. 2. Patient is admitted to receive collaborative, interdisciplinary care between the physiatrist, rehab nursing staff, and therapy team. 3. Patient's level of medical complexity and substantial therapy needs in context of that medical necessity cannot be provided at a lesser intensity of care such as a SNF. 4. Patient has experienced substantial functional loss from his/her baseline which was documented above under the "Functional History" and "Functional Status" headings.  Judging by the patient's diagnosis, physical exam, and functional history, the patient has potential for functional progress which will result in measurable gains while on inpatient rehab.  These gains will be of substantial and practical use upon discharge  in facilitating mobility and self-care at the household level. 5. Physiatrist will provide 24 hour management of medical needs as well as oversight of the therapy plan/treatment and provide guidance as appropriate regarding the interaction of the two. 6. 24 hour rehab nursing will assist with bladder management, bowel management, safety, skin/wound care, disease management, medication administration, pain management and patient education  and help integrate therapy concepts, techniques,education, etc. 7. PT will assess and treat for/with: Lower extremity strength, range of motion, stamina, balance, functional mobility, safety, adaptive techniques and equipment, NMR, education for caregivers.   Goals are: min assist . 8. OT will assess and treat for/with: ADL's, functional mobility, safety, upper extremity strength, adaptive techniques and equipment, NMR, education for caregivers.   Goals are: min to mod assist. 9. SLP will assess and treat for/with:  cognition, communication.  Goals are: supervision to min assist. 10. Case Management and Social Worker will assess and treat for psychological issues and discharge planning. 11. Team conference will be held weekly to assess progress toward goals and to determine barriers to discharge. 12. Patient will receive at least 3 hours of therapy per day at least 5 days per week. 13. ELOS: 2+ weeks (?decreased burden of care)      Prognosis:  good   Medical Problem List and Plan: 1. DVT Prophylaxis/Anticoagulation: Pharmaceutical: Lovenox 2. Pain Management: Will monitor for symptoms. Denies any problems but used percocet at home per records.  3. Metabolic encephalopathy/Mood: Poor insight/ awareness due to ongoing confusion.  4. Neuropsych: This patient is not capable of making decisions on his own behalf. 5. Salmonella bacteremia: IV antibiotic D# 17/42.  6. Agitation: continue Seroquel at bedtime with prn ativan. May need to titrate for better control.  7. Diarrhea: Likely due to antibiotics. Will add probiotic. Po intake has been poor.  8. ABLA: Will recheck on Monday.   Ranelle Oyster, MD, [place] [facility]   10/08/2012

## 2012-10-08 NOTE — Progress Notes (Signed)
Rehab admissions - I spoke with oldest daughter today and younger daughter a couple days ago.  Family in agreement to inpatient rehab.  Patient's sister is willing to take patient to her home after inpatient rehab stay if she can manage the patient.  Bed available on inpatient rehab and will admit to rehab today.  Call me for questions.  #[phone]

## 2012-10-08 NOTE — Progress Notes (Addendum)
Physical Therapy Treatment Patient Details Name: EL PILE MRN: [id number] DOB: 07/10/1945 Today's Date: 10/08/2012 Time: 4098-1191 PT Time Calculation (min): 24 min  PT Assessment / Plan / Recommendation Comments on Treatment Session  pt presents with AAA rupture and repair.  pt confused, and uncooperative with mobility.  Could not encourage pt to get OOB even though PT tried.  Pt has been to unwilling to participate on Acute, hope pt will participate better on rehab.      Follow Up Recommendations  SNF;Supervision/Assistance - 24 hour if pt does increase his participation with PT/OT                 Equipment Recommendations  None recommended by PT        Frequency Min 3X/week   Plan Frequency remains appropriate;Discharge plan needs to be updated    Precautions / Restrictions Precautions Precautions: Fall Restrictions Weight Bearing Restrictions: No   Pertinent Vitals/Pain VSS, no pain    Mobility  Bed Mobility Bed Mobility: Rolling Left;Left Sidelying to Sit Rolling Right: Not tested (comment) Rolling Left: 4: Min assist Right Sidelying to Sit: Not tested (comment) Left Sidelying to Sit: 3: Mod assist Sitting - Scoot to Edge of Bed: Not tested (comment) Details for Bed Mobility Assistance: Pt had hand mittens on on arrival.  Removed hand mittens and set up room to get pt OOB.  Pt talking to PT throughout and some of speech unintelligible.  Pt initially reached for rail and began to sit up.  Then pt stated, "I just can't do it today."  Pt proceeded to lie back down.  PT tried to encourage pt to sit up and get to chair.  Pt assisted by PT with encouragement to attempt to get up again and pt began swatting at therapist and tech.  PT assisted pt back to bed, replaced his mittens and made comfortable.  Noted that the condom cath was off and notified nursing.  Bed alarm put on as well.   Transfers Transfers: Not assessed Ambulation/Gait Ambulation/Gait Assistance: Not  tested (comment) Stairs: No Wheelchair Mobility Wheelchair Mobility: No    PT Goals Acute Rehab PT Goals PT Goal: Supine/Side to Sit - Progress: Progressing toward goal  Visit Information  Last PT Received On: 10/08/12 Assistance Needed: +2    Subjective Data  Subjective: "I would like to get up, " pt initially stated.   Cognition  Cognition Arousal/Alertness: Awake/alert Behavior During Therapy: Flat affect Overall Cognitive Status: Impaired/Different from baseline Area of Impairment: Orientation;Attention;Memory;Following commands;Safety/judgement;Awareness;Problem solving Orientation Level: Disoriented to;Place;Time;Situation Current Attention Level: Focused Memory: Decreased recall of precautions Following Commands: Follows one step commands inconsistently;Follows one step commands with increased time Safety/Judgement: Decreased awareness of safety;Decreased awareness of deficits Problem Solving: Slow processing;Decreased initiation;Difficulty sequencing;Requires verbal cues;Requires tactile cues    Balance  Static Sitting Balance Static Sitting - Comment/# of Minutes: Attempted but pt refused   End of Session PT - End of Session Activity Tolerance: Patient limited by fatigue;Other (comment) (self limiting) Patient left: with call bell/phone within reach;in bed;with bed alarm set Nurse Communication: Mobility status        INGOLD,Mirabelle Cyphers 10/08/2012, 3:35 PM [facility] [phone] (pager)

## 2012-10-08 NOTE — Progress Notes (Addendum)
[facility] [place]  Post-op  Intra-abdominal Surgery note  Date of Surgery: 09/21/2012 - 09/30/2012  Surgeon(s): Fransisco Hertz, MD  8 Days Post-Op Procedure(s): BYPASS GRAFT FEMORAL-POPLITEAL ARTERY  History of Present Illness  Charles [name] is a 67 y.o. male who is  up s/p Procedure(s): BYPASS GRAFT FEMORAL-POPLITEAL ARTERY Pt is doing better.  He was able to tell us where he lives and that he has 3 children.    IMAGING: No results found.  Significant Diagnostic Studies: CBC Lab Results  Component Value Date   WBC 6.7 10/06/2012   HGB 9.0* 10/06/2012   HCT 25.4* 10/06/2012   MCV 84.7 10/06/2012   PLT 217 10/06/2012    BMET    Component Value Date/Time   NA 136 10/06/2012 0411   K 3.5 10/06/2012 0411   CL 102 10/06/2012 0411   CO2 23 10/06/2012 0411   GLUCOSE 83 10/06/2012 0411   BUN 20 10/06/2012 0411   CREATININE 0.67 10/06/2012 0411   CALCIUM 7.6* 10/06/2012 0411   GFRNONAA >90 10/06/2012 0411   GFRAA >90 10/06/2012 0411    COAG Lab Results  Component Value Date   INR 1.16 09/30/2012   INR 1.07 09/29/2012   INR 1.11 09/28/2012   No results found for this basename: PTT    I/O last 3 completed shifts: In: 240 [P.O.:240] Out: 3375 [Urine:3375]    Physical Examination BP Readings from Last 3 Encounters:  10/08/12 167/70  10/08/12 167/70  10/08/12 167/70   Temp Readings from Last 3 Encounters:  10/08/12 98.7 F (37.1 C) Oral  10/08/12 98.7 F (37.1 C) Oral  10/08/12 98.7 F (37.1 C) Oral   SpO2 Readings from Last 3 Encounters:  10/07/12 97%  10/07/12 97%  10/07/12 97%   Pulse Readings from Last 3 Encounters:  10/08/12 81  10/08/12 81  10/08/12 81    General: Alert, WDWN male in NAD Pulmonary: normal non-labored breathing , without Rales, rhonchi,  wheezing Cardiac: Heart rate : regular ,  Abdomen:abdomen soft, well healed abdominal scar Abdominal wound:clean, dry, intact  Neurologic: A&O X 3; Appropriate Affect ;  Speech is 50%  understandable  Vascular Exam:BLE warm and well perfused.  Palpable DP bilateral. Extremities without ischemic changes, no Gangrene, no cellulitis; no open wounds;    Assessment/Plan: Charles [name] is a 68 y.o. male who is 17 Days Post-Op Procedure(s): BYPASS GRAFT FEMORAL-POPLITEAL ARTERY Family is deciding on SNF placement. Thanks for nutritional consult. Continue flexi seal due to diarrhea        Clinton Gallant [facility]  10/08/2012 8:18 AM     Transfer to in house rehab today. Would plan for 6 weeks of IV ceftriaxone and then f/u in ID clinic for consideration of po flouroquinolone Repeat c diff if negative then start some immodium Awaiting disposition  Fabienne Bruns, MD [facility] [place] Office: [phone] Pager: [phone]

## 2012-10-08 NOTE — Discharge Summary (Addendum)
[facility] Discharge Summary   Patient ID:  Charles [name] MRN: [id number] DOB/AGE: 69-Feb-1947 69 y.o.  Admit date: 09/21/2012 Discharge date: 10/08/2012 Date of Surgery: 09/21/2012 - 09/30/2012 Surgeon: Moishe Spice): Fransisco Hertz, MD  Admission Diagnosis: Adult failure to thrive [783.7] Hyponatremia [276.1] Hypoalbuminemia [273.8] Weakness [780.79] Thrombocytopenia [287.5] New onset atrial fibrillation [427.31] Generalized weakness [780.79] Abdominal pain [789.00]  Discharge Diagnoses:  Adult failure to thrive [783.7] Hyponatremia [276.1] Hypoalbuminemia [273.8] Weakness [780.79] Thrombocytopenia [287.5] New onset atrial fibrillation [427.31] Generalized weakness [780.79] Abdominal pain [789.00]  Secondary Diagnoses: Past Medical History  Diagnosis Date  . Hypercholesteremia   . Collapsed lung     s/p fall, rib fracture    Procedure(s): BYPASS GRAFT FEMORAL-POPLITEAL ARTERY  Discharged Condition: good  HPI: Patient is a 67 y.o. male transferred from [facility] with ruptured aneurysm call received from [facility] requesting transfer at 1019 pm. Arrived at [facility] 11:36. Pt was tachycardic but normtensive at that time. Per [facility] records pt has had off and on chronic abominal pain for a few weeks. He was noticed to be in atrial fibrillation on admission to [facility]. He was also found to have protein calorie malnutrition with albumin 2.5. He apparently has some alcohol history but was a poor historian . Other medical problems include elevated cholesterol, prior pneumothorax with pleurodesis, heavy smoker.   Aorto left common iliac right common femoral bypass,aortic endarterectomy, right femoral endarterectomy profundaplasty, placement abdominal VAC.  Critical care was consulted for respiratory failure vent management, A-fib, hypotension and multiple organ failure.  09/22/2012 he was taken back to the OR Procedure: Aorto to left common iliac and  right common femoral artery bypass carotid sure to infrarenal abdominal aortic aneurysm, aorticendarterectomy, right common femoral endarterectomy with profundoplasty, ligation of inferior mesenteric artery right external iliac artery and right internal iliac artery.  Nutritional consult for sever malnutrition: Estimated Nutritional Needs:  Kcal: 1350-1500  Protein: 80-90 gm  Fluid: per MD  He was taken back to the OR 09/24/2012 by Dr. Darrick Penna for: Exploratory laparotomy closure of the abdomen.  He developed post op anemia and was transfused 2 units of PRBC hematocrit was 24.6%.   Blood cultures were sent 09/22/2012 positive for Salmonella.  Ancef and Zosyn started   4/24 Off pressors, tolerating pressure support, TNA started.  NG tube placed 09/25/2012.  CXR similar to previous with bilateral infiltrates concerning for PNA vs edema; afebrile, no leukocytosis; minimal secretions on suctioning; Dr. Frederico Hamman with [facility] ordered lasix 80 mg; cont Zosyn; if febrile low threshold to start Vancomycin.      TTE 09/25/12 >>  Study Conclusions - Left ventricle: The cavity size was normal. Wall thickness was normal. Systolic function was moderately reduced. The estimated ejection fraction was in the range of 35% to 40%. Diffuse hypokinesis. - Aortic valve: Mild to moderate regurgitation. Valve area: 1.4cm^2(VTI). Valve area: 1.31cm^2 (Vmax). - Mitral valve: Calcified annulus. Mildly thickened leaflets . Moderate regurgitation. - Left atrium: The atrium was mildly dilated. - Right atrium: The atrium was mildly dilated. - Pulmonary arteries: Systolic pressure was severely increased. PA peak pressure: 71mm Hg (S).   infectious disease Physician was consulted 09/28/2012    We were consulted to assist in workup of this pt with Salmonella bacteremia and ruptured aneurysm theyrecommended adding Flagyl to the Ceftriaxone to keep some anerobic coverage for lungs with his ceftriaxone.  Plan Would plan for 6 weeks of IV  ceftriaxone and then f/u in ID clinic for consideration  of po flouroquinolone.     On 09/30/2012 he developed Ischemic right lower extremity.  Dr. Leonides Sake performed right aortic graft limb to below-the-knee popliteal artery bypass with Propaten.  He tolerated surgery well.   10/03/2012 he developed diarrhea C-diff culture was negative.   We discontinued Reglan and a flexiseal was placed.  Patient was extubated and advanced to liquids slowly 10/05/2012.  He has been very confused and unable to communicate person, place, but he is alert.  He was transferred to 2000 10/06/2012.  Dr. Ranelle Oyster, MD was consulted for rehabilitation due to deconditioning.  Today he will be transferred to in house Rehab.   Hospital Course:  Charles [name] is a 67 y.o. male is S/P Right Procedure(s): BYPASS GRAFT FEMORAL-POPLITEAL ARTERY, Repair ruptured AAA   Extubated: POD # 10/05/2012  Physical exam: Alert and verbally communicating   Cardiac: irregular  Lungs: CTAB  Abdomen: Soft NT/ND-flexi seal still in place for some diarrhea  Incisions: Abdominal and right groin c/d/i  Extremities: + palpable DP pulses bilaterally  Post-op wounds clean, dry, intact or healing well Pt. Ambulating, voiding and taking PO diet without difficulty. Pt pain controlled with PO pain meds. Labs as below Complications:see hospital history above  Consults: CCM Infectious disease CIR Nutritional management See hospital course.    Significant Diagnostic Studies: CBC Lab Results  Component Value Date   WBC 6.7 10/06/2012   HGB 9.0* 10/06/2012   HCT 25.4* 10/06/2012   MCV 84.7 10/06/2012   PLT 217 10/06/2012    BMET    Component Value Date/Time   NA 136 10/06/2012 0411   K 3.5 10/06/2012 0411   CL 102 10/06/2012 0411   CO2 23 10/06/2012 0411   GLUCOSE 83 10/06/2012 0411   BUN 20 10/06/2012 0411   CREATININE 0.67 10/06/2012 0411   CALCIUM 7.6* 10/06/2012 0411   GFRNONAA >90 10/06/2012 0411   GFRAA >90 10/06/2012 0411   COAG Lab Results   Component Value Date   INR 1.16 09/30/2012   INR 1.07 09/29/2012   INR 1.11 09/28/2012     Disposition:  Discharge to :Rehab Discharge Orders   Future Orders Complete By Expires     Activity as tolerated - No restrictions  As directed     Call MD for:  redness, tenderness, or signs of infection (pain, swelling, bleeding, redness, odor or green/yellow discharge around incision site)  As directed     Call MD for:  severe or increased pain, loss or decreased feeling  in affected limb(s)  As directed     Call MD for:  temperature >100.5  As directed     Resume previous diet  As directed         Medication List    STOP taking these medications       ibuprofen 200 MG tablet  Commonly known as:  ADVIL,MOTRIN      TAKE these medications       dextrose 5 % SOLN 50 mL with cefTRIAXone 2 G SOLR 2 g  Inject 2 g into the vein daily.     oxyCODONE-acetaminophen 5-325 MG per tablet  Commonly known as:  PERCOCET/ROXICET  Take 1 tablet by mouth every 4 (four) hours as needed for pain.       Verbal and written Discharge instructions given to the patient. Wound care per Discharge AVS     Follow-up Information   Follow up with Sherren Kerns, MD In 2 weeks. (sent)  Contact information:   [address] [place] [address] [phone]       Signed: Clinton Gallant [facility] 10/08/2012, 9:33 AM    - For VQI Registry use --- Instructions: Press F2 to tab through selections.  Delete question if not applicable.   Post-op:  Time to Extubation: [ ]  In OR, [ ]  < 12 hrs, [ ]  12-24 hrs, [x ] >=24 hrs Vasopressors Req. Post-op: Yes ICU Stay: 15 days Transfusion: Yes  If yes, 2 units given MI: [x ] No, [ ]  Troponin only, [ ]  EKG or Clinical New Arrhythmia: Yes  Complications: CHF: No Resp failure: [ ]  none, [ ]  Pneumonia, [x ] Ventilator Chg in renal function: [x ] none, [ ]  Inc. Cr > 0.5, [ ]  Temp. Dialysis, [ ]  Permanent dialysis Leg ischemia: [ ]  No, [x ] Yes, no Surgery  needed, [x ] Yes, Surgery needed, [ ]  Amputation Bowel ischemia: [x ] No, [ ]  Medical Rx, [ ]  Surgical Rx Wound complication: [x ] No, [ ]  Superficial separation/infection, [ ]  Return to OR Return to OR: Yes  Return to OR for bleeding: No Stroke: [x ] None, [ ]  Minor, [ ]  Major  Discharge medications: Statin use:  No for medical reasons ASA use:  No  for medical reason   Plavix use:  No  for medical reason   Beta blocker use: Yes

## 2012-10-08 NOTE — Progress Notes (Signed)
Pt agitated and non compliant.  1 mg PRN ativan given.  For transfer to inpatient rehab this afternoon.

## 2012-10-08 NOTE — PMR Pre-admission (Signed)
PMR Admission Coordinator Pre-Admission Assessment  Patient: Charles [name] is an 67 y.o., male MRN: [id number] DOB: [birth year] Height: 5\' 4"  (162.6 cm) Weight: 57.6 kg (126 lb 15.8 oz)              Insurance Information HMO:      PPO:       PCP:       IPA:       80/20:       OTHER:   PRIMARY: Medicare A/B      Policy#: [id number]      Subscriber: Fran Lowes CM Name:        Phone#:       Fax#:   Pre-Cert#:        Employer: Retired - does odd jobs as needed Benefits:  Phone #:       Name: Armed forces technical officer. Date: 12/31/08     Deduct: $1216      Out of Pocket Max: None      Life Max: Unlimited CIR: 100%      SNF: 100 days Outpatient: 80%     Co-Pay: 20% Home Health: 100%      Co-Pay: None DME: 80%     Co-Pay: 20% Providers: patient's choice  SECONDARY: Medicaid      Policy#: [id number]      Subscriber:  Fran Lowes CM Name:        Phone#:       Fax#:   Pre-Cert#:        Employer: Retired Benefits:  Phone #:  [phone]     Name:   Eff. Date: Eligible 10/08/12     Deduct:        Out of Pocket Max:        Life Max:   CIR:        SNF:   Outpatient:       Co-Pay:   Home Health:        Co-Pay:   DME:       Co-Pay:     Emergency Contact Information Contact Information   Name Relation Home Work Milan Jr,Tramane Son [phone]     Wynonia Lawman [phone]  [phone]     Current Medical History  Patient Admitting Diagnosis: Severe deconditioning and encephalopathy after ruptured AAA   History of Present Illness: A 67 y.o. male with two month history of abdominal pain, low back pain with LLE weakness with problems walking and difficulty with oral intake. He was admitted via [facility] on 09/22/12 for treatment of ruptured AAA with hemorrhage extending into left iliopsoas muscle. He was taken to OR emergently for AAA repair with left common iliac to right common femoral bypass, aortic endarterectomy, right femoral endarterectomy profundaplasty, placement abdominal  VAC. Placed on IV zosyn. Abdominal wound closure done on 04/25. BC/UCS positive for salmonella and ID consulted for input on treatment course. Dr. 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  2D echo with diffuse hypokinesis with EF 35-40%. Follow up BC negative. Patient developed RLE ischemia requiring Right aortic graft limb to below knee bypass on 05/01. Has had confusion due to encephalopathy limiting extubation. He was extubated on 05/05 and continues to have issues with confusion/paranoia. Clear liquid diet initiated with plans to advance if tolerating po's. PT/OT evaluations pending. MD recommending CIR.  Update: Remains confused and disoriented.  Has mittens on. Not eating well.  Will need increased focus on  nutrition.  Is cooperative and participating with therapies.  Has a flexiseal in place for continued loose stools.       Past Medical History  Past Medical History  Diagnosis Date  . Hypercholesteremia   . Collapsed lung     s/p fall, rib fracture    Family History  family history includes Diabetes in his father and mother.  Prior Rehab/Hospitalizations:  None   Current Medications  Current facility-administered medications:acetaminophen (TYLENOL) solution 650 mg, 650 mg, Oral, Q4H PRN, Merwyn Katos, MD;  albuterol (PROVENTIL) (5 MG/ML) 0.5% nebulizer solution 2.5 mg, 2.5 mg, Nebulization, Q4H PRN, Leslye Peer, MD;  antiseptic oral rinse (BIOTENE) solution 15 mL, 15 mL, Mouth Rinse, QID, Alyson Reedy, MD, 15 mL at 10/08/12 0607 cefTRIAXone (ROCEPHIN) 2 g in dextrose 5 % 50 mL IVPB, 2 g, Intravenous, Q24H, Randall Hiss, MD, 2 g at 10/06/12 1731;  chlorhexidine (PERIDEX) 0.12 % solution 15 mL, 15 mL, Mouth Rinse, BID, Alyson Reedy, MD, 15 mL at 10/07/12 1957;  enoxaparin (LOVENOX) injection 40 mg, 40 mg, Subcutaneous, Q24H, Samantha J Rhyne, PA-C, 40 mg at 10/06/12 1731 feeding supplement  (ENSURE COMPLETE) liquid 237 mL, 237 mL, Oral, BID BM, Ailene Ards, RD, 237 mL at 10/06/12 1500;  haloperidol lactate (HALDOL) injection 1 mg, 1 mg, Intravenous, Q3H PRN, Merwyn Katos, MD, 1 mg at 10/08/12 0018;  hydrALAZINE (APRESOLINE) injection 10 mg, 10 mg, Intravenous, Q2H PRN, Amelia Jo Roczniak, PA-C, 10 mg at 09/29/12 0354 labetalol (NORMODYNE,TRANDATE) injection 10 mg, 10 mg, Intravenous, Q2H PRN, Amelia Jo Roczniak, PA-C, 10 mg at 10/05/12 1610;  LORazepam (ATIVAN) injection 0.5-1 mg, 0.5-1 mg, Intravenous, Q4H PRN, Merwyn Katos, MD, 1 mg at 10/08/12 0131;  metoprolol (LOPRESSOR) tablet 50 mg, 50 mg, Oral, BID, Merwyn Katos, MD, 50 mg at 10/07/12 2142;  ondansetron [facility]) injection 4 mg, 4 mg, Intravenous, Q6H PRN, Amelia Jo Roczniak, PA-C oxyCODONE-acetaminophen (PERCOCET/ROXICET) 5-325 MG per tablet 1-2 tablet, 1-2 tablet, Oral, Q4H PRN, Amelia Jo Roczniak, PA-C, 2 tablet at 10/05/12 1038;  QUEtiapine (SEROQUEL XR) 24 hr tablet 50 mg, 50 mg, Oral, QHS, Merwyn Katos, MD, 50 mg at 10/07/12 2142;  sodium chloride 0.9 % injection 10-40 mL, 10-40 mL, Intracatheter, Q12H, Sherren Kerns, MD, 10 mL at 10/06/12 2248 sodium chloride 0.9 % injection 10-40 mL, 10-40 mL, Intracatheter, PRN, Sherren Kerns, MD, 10 mL at 10/02/12 2257  Patients Current Diet: General  Precautions / Restrictions Precautions Precautions: Fall Restrictions Weight Bearing Restrictions: No   Prior Activity Level Community (5-7x/wk): Went out daily.  Worked PT doing side work like Herbalist / Equipment Home Assistive Devices/Equipment: None (per patient's daughter. ) Home Adaptive Equipment: None  Prior Functional Level Prior Function Level of Independence: Independent Able to Take Stairs?: Yes Driving:  (Could drive, but truck needs repairs.) Vocation: Retired  Current Functional Level Cognition  Arousal/Alertness: Awake/alert Overall Cognitive  Status: Impaired/Different from baseline Current Attention Level: Focused Memory: Decreased recall of precautions Orientation Level: Oriented to person;Disoriented to time;Oriented to place Following Commands: Follows one step commands inconsistently;Follows one step commands with increased time Safety/Judgement: Decreased awareness of safety;Decreased awareness of deficits    Extremity Assessment (includes Sensation/Coordination)  RUE ROM/Strength/Tone: WFL for tasks assessed  RLE ROM/Strength/Tone: WFL for tasks assessed    ADLs  Grooming: Maximal assistance Where Assessed - Grooming: Supine, head of bed up Upper Body Dressing: +1 Total assistance Where Assessed -  Upper Body Dressing: Supine, head of bed up Lower Body Dressing: +1 Total assistance Where Assessed - Lower Body Dressing: Supine, head of bed up Transfers/Ambulation Related to ADLs: NT on eval due to lack of +2 assistance ADL Comments: Patient presents confused, needs cues throughout for completion of ADLs.    Mobility  Bed Mobility: Right Sidelying to Sit;Rolling Right;Sitting - Scoot to Edge of Bed Rolling Right: 4: Min assist;With rail Rolling Left: 4: Min assist Right Sidelying to Sit: 4: Min assist Left Sidelying to Sit: 4: Min assist Sitting - Scoot to [employer] of Bed: 4: Min assist    Transfers  Transfers: Sit to Stand;Stand to [employer] Transfers Sit to Stand: 1: +2 Total assist;With upper extremity assist;From bed Sit to Stand: Patient Percentage: 50% Stand to Sit: 1: +2 Total assist;With upper extremity assist;To chair/3-in-1;With armrests Stand to Sit: Patient Percentage: 30% Stand Pivot Transfers: 1: +2 Total assist Stand Pivot Transfers: Patient Percentage: 50%    Ambulation / Gait / Stairs / Wheelchair Mobility  Ambulation/Gait Ambulation/Gait Assistance: Not tested (comment) Stairs: No Wheelchair Mobility Wheelchair Mobility: No    Posture / Games developer Sitting -  Balance Support: Bilateral upper extremity supported;Feet supported Static Sitting - Level of Assistance: 5: Stand by assistance Static Sitting - Comment/# of Minutes: 2 Static Standing Balance Static Standing - Balance Support: Bilateral upper extremity supported;During functional activity Static Standing - Level of Assistance: 1: +2 Total assist (pt 60%) Static Standing - Comment/# of Minutes: pt able to maintain static stand performing 60%.  cues for upright posture and attending to task.      Special needs/care consideration BiPAP/CPAP No CPM No Continuous Drip IV No Dialysis No        Life Vest No Oxygen On 2L [place] Special Bed No Trach Size No Wound Vac (area) No    Skin Has red areas on back and coccyx area                           Bowel mgmt: Has flexiseal for continued loose stools Bladder mgmt: Incontinent.  Using condom catheter as needed Diabetic mgmt No    Previous Home Environment Living Arrangements: Alone;Other (Comment) (with relative ) Lives With: Family Available Help at Discharge: Family;Available 24 hours/day;Other (Comment) (trying to arrange 24 hour care) Type of Home: House Home Layout: One level Home Access: Stairs to enter Entrance Stairs-Number of Steps: several Home Care Services: No Additional Comments: TBA (pt confused and unable to report)  Discharge Living Setting Plans for Discharge Living Setting: Patient's home;House;Lives with (comment) (Nephew lived with patient PTA.) Type of Home at Discharge: House Discharge Home Layout: One level;Laundry or work area in basement;Able to live on main level with bedroom/bathroom Discharge Home Access: Stairs to enter [employer] of Steps: 5-6 step entry Do you have any problems obtaining your medications?: No  Social/Family/Support Systems Patient Roles: Parent Contact Information: Ellan Lambert - Dtr [phone]; Fran Lowes, Montez Hageman [phone]; Arithia - Dtr oldest (c) [phone] Anticipated  Caregiver: Possible Hilaria Ota - sister Ability/Limitations of Caregiver: Sister providing care for another family member.  Patient could go to sisters home for after rehab care. (Dtrs work.  Nephew is looking for a job.) Caregiver Availability: 24/7 Discharge Plan Discussed with Primary Caregiver: Yes Is Caregiver In Agreement with Plan?: Yes Does Caregiver/Family have Issues with Lodging/Transportation while Pt is in Rehab?: No   Goals/Additional Needs Patient/Family Goal for Rehab: PT/OT s/min A, ST  S goals Expected length of stay: 2-3 weeks Cultural Considerations: Loosely follows Muslim practices, especially fasting. Dietary Needs: Mechanical soft diet with thin liquids Equipment Needs: TBD Pt/Family Agrees to Admission and willing to participate: Yes (Spoke with daughters Ellan Lambert and Standley Dakins.)   Decrease burden of Care through IP rehab admission: Diet advancement, Decrease number of caregivers, Bowel and bladder program and Patient/family education   Possible need for SNF placement upon discharge: Yes.  May need SNF if he cannot make sufficient progress.  Sister, Hilaria Ota, is willing to care for patient in her home if she can manage him.   Patient Condition: This patient's medical and functional status has changed since the consult dated: 10/05/12 in which the Rehabilitation Physician determined and documented that the patient's condition is appropriate for intensive rehabilitative care in an inpatient rehabilitation facility. See "History of Present Illness" (above) for medical update. Functional changes are: Currently requiring total assist +2 contributing 50% for transfers. Patient's medical and functional status update has been discussed with the Rehabilitation physician and patient remains appropriate for inpatient rehabilitation. Will admit to inpatient rehab today.  Preadmission Screen Completed By:  Trish Mage, 10/08/2012 10:19  AM ______________________________________________________________________   Discussed status with Dr. Riley Kill on 10/08/12 at 1120 and received telephone approval for admission today.  Admission Coordinator:  Trish Mage, time1121/Date05/09/14

## 2012-10-08 NOTE — Progress Notes (Signed)
Offered to help feed Pt, he became agitated and refused.  Also refused to take PO metoprolol.  Await assignment of new bed on 4000.

## 2012-10-09 ENCOUNTER — Inpatient Hospital Stay (HOSPITAL_COMMUNITY): Payer: Medicare Other | Admitting: Physical Therapy

## 2012-10-09 ENCOUNTER — Inpatient Hospital Stay (HOSPITAL_COMMUNITY): Payer: Medicare Other | Admitting: Speech Pathology

## 2012-10-09 ENCOUNTER — Inpatient Hospital Stay (HOSPITAL_COMMUNITY): Payer: Medicare Other

## 2012-10-09 DIAGNOSIS — R7881 Bacteremia: Secondary | ICD-10-CM

## 2012-10-09 DIAGNOSIS — R404 Transient alteration of awareness: Secondary | ICD-10-CM

## 2012-10-09 DIAGNOSIS — R5381 Other malaise: Secondary | ICD-10-CM

## 2012-10-09 DIAGNOSIS — I713 Abdominal aortic aneurysm, ruptured: Secondary | ICD-10-CM

## 2012-10-09 LAB — CULTURE, BLOOD (ROUTINE X 2)
Culture: NO GROWTH
Culture: NO GROWTH

## 2012-10-09 NOTE — Evaluation (Signed)
Physical Therapy Assessment and Plan  Patient Details  Name: Charles [name] MRN: [id number] Date of Birth: 11-Sep-1945  PT Diagnosis: Abnormality of gait, Cognitive deficits and Muscle weakness Rehab Potential: Good ELOS: 14 days   Today's Date: 10/09/2012 Time: 0906-1000 Time Calculation (min): 54 min  Problem List:  Patient Active Problem List   Diagnosis Date Noted  . Delirium 10/04/2012  . Salmonella bacteremia 09/25/2012  . Bacteremia 09/23/2012  . Acute respiratory failure 09/22/2012  . Hyponatremia 09/21/2012  . Adult failure to thrive 09/21/2012  . Abdominal pain 09/21/2012  . Dehydration 09/21/2012  . Generalized weakness 09/21/2012  . AAA (abdominal aortic aneurysm, ruptured) 09/21/2012  . New onset atrial fibrillation 09/21/2012  . Alcohol abuse 09/21/2012    Past Medical History:  Past Medical History  Diagnosis Date  . Hypercholesteremia   . Collapsed lung     s/p fall, rib fracture   Past Surgical History:  Past Surgical History  Procedure Laterality Date  . Hernia repair    . Pleural scarification      collapsed lung  . Abdominal aortic aneurysm repair N/A 09/21/2012    Procedure: ANEURYSM ABDOMINAL AORTIC REPAIR;  Surgeon: Sherren Kerns, MD;  Location: [facility] OR;  Service: Vascular;  Laterality: N/A;  . Patch angioplasty  09/21/2012    Procedure: PATCH ANGIOPLASTY;  Surgeon: Sherren Kerns, MD;  Location: [facility] OR;  Service: Vascular;;  Hemashield Patch Angioplasty fo Right Common Femoral Artery  . Embolectomy Right 09/21/2012    Procedure: EMBOLECTOMY;  Surgeon: Sherren Kerns, MD;  Location: [facility] OR;  Service: Vascular;  Laterality: Right;  Embolectomy of right leg.  . Wound debridement  09/24/2012    Procedure: Removal of Abdominal wound VAC, and Abdominal Closure;  Surgeon: Sherren Kerns, MD;  Location: [facility] OR;  Service: Vascular;;  . Femoral-popliteal bypass graft Right 09/30/2012    Procedure: BYPASS GRAFT FEMORAL-POPLITEAL ARTERY;  Surgeon: Fransisco Hertz, MD;  Location: [facility] OR;  Service: Vascular;  Laterality: Right;    Assessment & Plan Clinical Impression: Patient is a 67 y.o. year old male with recent admission to the hospital on 09/22/12 with AAA.  Patient transferred to CIR on 10/08/2012 .   Patient currently requires mod with mobility secondary to muscle weakness, decreased cardiorespiratoy endurance, decreased attention, decreased awareness, decreased safety awareness, decreased memory and delayed processing and decreased standing balance, decreased postural control and decreased balance strategies.  Prior to hospitalization, patient was independent  with mobility and lived with Family in a House home.  Home access is 6 per patientStairs to enter.  Patient will benefit from skilled PT intervention to maximize safe functional mobility, minimize fall risk and decrease caregiver burden for planned discharge home with 24 hour assist.  Anticipate patient will benefit from follow up [facility] at discharge.  PT - End of Session Activity Tolerance: Tolerates 30+ min activity with multiple rests Endurance Deficit: Yes PT Assessment Rehab Potential: Good Barriers to Discharge: Decreased caregiver support PT Plan PT Intensity: Minimum of 1-2 x/day ,45 to 90 minutes PT Duration Estimated Length of Stay: 14 days PT Treatment/Interventions: Ambulation/gait training;Balance/vestibular training;Cognitive remediation/compensation;Community reintegration;Discharge planning;DME/adaptive equipment instruction;Functional mobility training;Neuromuscular re-education;Patient/family education;Stair training;Therapeutic Activities;Therapeutic Exercise;UE/LE Strength taining/ROM;UE/LE Coordination activities;Wheelchair propulsion/positioning PT Recommendation Follow Up Recommendations: Home health PT Patient destination: Home Equipment Recommended: Rolling walker with 5" wheels  Skilled Therapeutic Intervention Treatment included gait training with RW on unit x 70'  with mod@.  Pt has a posterior bias when performing sit to stand or  when standing.  Stands with significant hip flexion and weight through his heels.  RW assist in bringing his weight forward.  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) Cognition Overall Cognitive Status: Impaired/Different from baseline Arousal/Alertness: Awake/alert Orientation Level: Oriented to person;Disoriented to place;Disoriented to time;Disoriented to situation Attention: Sustained Sustained Attention: Impaired Sustained Attention Impairment: Verbal basic;Functional basic Memory: Impaired Memory Impairment: Storage deficit;Retrieval deficit;Decreased recall of new information Awareness: Impaired Awareness Impairment: Intellectual impairment Problem Solving: Impaired Problem Solving Impairment: Verbal basic;Functional basic Executive Function:  (all impaired due to lower level deficits) Behaviors: Perseveration;Verbal agitation Safety/Judgment: Impaired Comments: Patient had mittens on and a quick release belt. Pt is to be at nurse's desk for supervision. Patient has periods of agitation and combativeness Sensation Sensation Light Touch: Appears Intact Mobility Transfers Sit to Stand: 3: Mod assist;With upper extremity assist;With armrests Stand to Sit: 4: Min assist;With upper extremity assist;With armrests Locomotion  Ambulation Ambulation/Gait Assistance: 1: +2 Total assist Gait Gait: Yes Gait Pattern: Decreased step length - right;Decreased step  length - left;Decreased dorsiflexion - right;Decreased trunk rotation;Wide base of support Gait velocity: significant decreased speed. Stairs / Additional Locomotion Stairs: Yes Stairs Assistance: 1: +2 Total assist Stair Management Technique: Two rails;Alternating pattern;Step to pattern Number of Stairs: 5 Height of Stairs: 6 Wheelchair Mobility Wheelchair Mobility: No  Balance Static Sitting Balance Static Sitting - Balance Support: No upper extremity supported Static Sitting - Level of Assistance: 5: Stand by assistance Static Standing Balance Static Standing - Balance Support: Bilateral upper extremity supported Static Standing - Level of Assistance: 3: Mod assist Berg Score=5/56 Extremity Assessment      RLE Assessment RLE Assessment:  (grossly 4/5 throughout) LLE Assessment LLE Assessment:  (grossly 4/5 throughout)  FIM:  FIM - Bed/Chair Transfer Bed/Chair Transfer: 1: Two helpers (2 helpers with nsg for safety) FIM - Locomotion: Ambulation Ambulation/Gait Assistance: 1: +2 Total assist   Refer to Care Plan for Long Term Goals  Recommendations for other services: None  Discharge Criteria: Patient will be discharged from PT if patient refuses treatment 3 consecutive times without medical reason, if treatment goals not met, if there is a change in medical status, if patient makes no progress towards goals or if patient is discharged from hospital.  The above assessment, treatment plan, treatment alternatives and goals were discussed and mutually agreed upon: No family available/patient unable  Georges Mouse 10/09/2012, 2:52 PM

## 2012-10-09 NOTE — Progress Notes (Signed)
At 2000, pt is agitated, trying to get out of the chair, trying to punch and kick staff when approaching him, Ativan 0.5 mg IM given per order.  Pt removed his IV cannula and condom cath.  At 2300, still agitated and refused to take all his oral meds.  Informed Dr. Amador Cunas,  Ordered for Enclosure bed for pt's safety and ordered to change Ativan IM to Ativan 0.5mg  IV now then every 4 hours PRN.  Will continue to monitor pt.

## 2012-10-09 NOTE — Progress Notes (Signed)
Pt sitting at the desk this morning, per report agitated and combative on night shift. Pt was calm and cooperative with AM therapies. Alert to self and place at start of shift, but confused to situation and time. Family came to visit around lunch time and pt became agitated again, trying to get OOB and stating he was getting his clothes and leaving. Pt pushing staff away when they attempted to assist him to chair, and pushing away when we attempted to place QRB. Would not allow mittens to be placed. Pts family decided to leave, and pt was assisted to recliner with QRB placed and brought to desk. Enclosure bed ordered by on call MD last night and just arrived on unit. Order obtained for restraints- enclosure bed. Pt calmer now at desk and PRN ativan not required. Continue to monitor. Mick Sell, RN

## 2012-10-09 NOTE — Progress Notes (Signed)
Portable equipment was contacted for the Enclosure bed,  Called at 0030 and informed that there's no available Enclosure bed at the moment.

## 2012-10-09 NOTE — Progress Notes (Signed)
Patient ID: Charles [name], male   DOB: 06/21/1945, 67 y.o.   MRN: [id number]  41/34. 67 year old patient admitted to the rehabilitation service yesterday with severe deconditioning following treatment of a ruptured AAA approximately 3 weeks ago. Hospital course complicated by Salmonella bacteremia and he is completing IV Rocephin. He has a history of LV dysfunction, history of alcohol abuse he continues to have issues with confusion and intermittent paranoia. He remains quite weak but his delirium has improved.  BP Readings from Last 3 Encounters:  10/09/12 148/83  10/08/12 159/92  10/08/12 159/92   Gen- weak but alert. Offer no complaints ; HEENT- poor dental hygiene with numerous missing dentition ; chest clear anterolaterally ; cardiovascular rhythm is irregular with a controlled ventricular response ; abdomen soft nondistended ; extremities no edema   Impression- severe deconditioning Status post AAA rupture History of Salmonella bacteremia Toxic metabolic encephalopathy slowly improving  Plan- continue IV Rocephin,CIR Continue DVT prophylaxis with Lovenox    Author: Ranelle Oyster, MD Service: Physical Medicine and Rehabilitation Author Type: Physician    Filed: 10/08/2012  2:42 PM Note Time: 10/08/2012 10:16 AM           Physical Medicine and Rehabilitation Admission H&P      Chief Complaint   Patient presents with   .  Deconditioning. Metabolic encephalopathy.     : HPI:  Charles [name] is a 67 y.o. male with two month history of abdominal pain, low back pain with LLE weakness with problems walking and difficulty with oral intake. He was admitted via [facility] on 09/22/12 for treatment of ruptured AAA with hemorrhage extending into left iliopsoas muscle. He was taken to OR emergently for AAA repair with left common iliac to right common femoral bypass, aortic endarterectomy, right femoral endarterectomy profundaplasty, placement abdominal VAC. Placed on IV zosyn. Abdominal wound  closure done on 04/25. BC/UCS positive for salmonella and ID consulted for input on treatment course. Dr. 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    2D echo with diffuse hypokinesis with EF 35-40%. Follow up BC negative. Patient developed RLE ischemia requiring Right aortic graft limb to below knee bypass on 05/01. Has had confusion due to encephalopathy limiting extubation. He was extubated on 05/05 and continues to have issues with confusion/paranoia. Has had signifcant diarrhea requring rectal tube. C-diff check 5/4 negative. Diet advanced to  Regular. Therapies initiated and patient needs multiple cues for basic tasks,is deconditioned and working on pre-gait activities. . Therapy team recommending CIR.      Review of Systems  Unable to perform ROS: mental acuity     Past Medical History   Diagnosis  Date   .  Hypercholesteremia     .  Collapsed lung         s/p fall, rib fracture       Past Surgical History   Procedure  Laterality  Date   .  Hernia repair       .  Pleural scarification           collapsed lung   .  Abdominal aortic aneurysm repair  N/A  09/21/2012       Procedure: ANEURYSM ABDOMINAL AORTIC REPAIR;  Surgeon: Sherren Kerns, MD;  Location: [facility] OR;  Service: Vascular;  Laterality: N/A;   .  Patch angioplasty    09/21/2012       Procedure: PATCH ANGIOPLASTY;  Surgeon: Sherren Kerns,  MD;  Location: [facility] OR;  Service: Vascular;;  Hemashield Patch Angioplasty fo Right Common Femoral Artery   .  Embolectomy  Right  09/21/2012       Procedure: EMBOLECTOMY;  Surgeon: Sherren Kerns, MD;  Location: [facility] OR;  Service: Vascular;  Laterality: Right;  Embolectomy of right leg.   .  Wound debridement    09/24/2012       Procedure: Removal of Abdominal wound VAC, and Abdominal Closure;  Surgeon: Sherren Kerns, MD;  Location: [facility] OR;  Service: Vascular;;   .  Femoral-popliteal bypass graft  Right   09/30/2012       Procedure: BYPASS GRAFT FEMORAL-POPLITEAL ARTERY;  Surgeon: Fransisco Hertz, MD;  Location: [facility] OR;  Service: Vascular;  Laterality: Right;       Family History   Problem  Relation  Age of Onset   .  Diabetes  Mother     .  Diabetes  Father        Social History: Lives alone in [place]. He reports that he has been smoking Cigarettes--1/2 PPD? He has been smoking about 0.00 packs per day. He does not have any smokeless tobacco history on file. He reports that drinks alcohol ~ 1/2 pint daily?. He reports that he does not use illicit drugs.      Allergies: No Known Allergies    Medications Prior to Admission   Medication  Sig  Dispense  Refill   .  oxyCODONE-acetaminophen (PERCOCET/ROXICET) 5-325 MG per tablet  Take 1 tablet by mouth every 4 (four) hours as needed for pain.         .  [DISCONTINUED] ibuprofen (ADVIL,MOTRIN) 200 MG tablet  Take 400 mg by mouth every 6 (six) hours as needed for pain.              Home: Home Living Lives With: Family Available Help at Discharge: Family;Available 24 hours/day;Other (Comment) (trying to arrange 24 hour care) Type of Home: House Home Access: Stairs to enter Entrance Stairs-Number of Steps: several Home Layout: One level Home Adaptive Equipment: None Additional Comments: TBA (pt confused and unable to report)    Functional History: Prior Function Able to Take Stairs?: Yes Driving:  (Could drive, but truck needs repairs.) Vocation: Retired   Functional Status:   Mobility: Bed Mobility Bed Mobility: Right Sidelying to Sit;Rolling Right;Sitting - Scoot to [place] of Bed Rolling Right: 4: Min assist;With rail Rolling Left: 4: Min assist Right Sidelying to Sit: 4: Min assist Left Sidelying to Sit: 4: Min assist Sitting - Scoot to [place] of Bed: 4: Min assist Transfers Transfers: Sit to Stand;Stand to [employer] Transfers Sit to Stand: 1: +2 Total assist;With upper extremity assist;From bed Sit to Stand: Patient  Percentage: 50% Stand to Sit: 1: +2 Total assist;With upper extremity assist;To chair/3-in-1;With armrests Stand to Sit: Patient Percentage: 30% Stand Pivot Transfers: 1: +2 Total assist Stand Pivot Transfers: Patient Percentage: 50% Ambulation/Gait Ambulation/Gait Assistance: Not tested (comment) Stairs: No Wheelchair Mobility Wheelchair Mobility: No   ADL: ADL Grooming: Maximal assistance Where Assessed - Grooming: Supine, head of bed up Upper Body Dressing: +1 Total assistance Where Assessed - Upper Body Dressing: Supine, head of bed up Lower Body Dressing: +1 Total assistance Where Assessed - Lower Body Dressing: Supine, head of bed up Transfers/Ambulation Related to ADLs: NT on eval due to lack of +2 assistance ADL Comments: Patient presents confused, needs cues throughout for completion of ADLs.   Cognition: Cognition Overall Cognitive Status: Impaired/Different  from baseline Arousal/Alertness: Awake/alert Orientation Level: Oriented to person;Disoriented to time;Oriented to place Cognition Arousal/Alertness: Awake/alert Behavior During Therapy: Flat affect Overall Cognitive Status: Impaired/Different from baseline Area of Impairment: Orientation;Attention;Memory;Following commands;Safety/judgement;Awareness;Problem solving Orientation Level: Disoriented to;Place;Time;Situation Current Attention Level: Focused Memory: Decreased recall of precautions Following Commands: Follows one step commands inconsistently;Follows one step commands with increased time Safety/Judgement: Decreased awareness of safety;Decreased awareness of deficits Problem Solving: Slow processing;Decreased initiation;Difficulty sequencing;Requires verbal cues;Requires tactile cues   Physical Exam: Blood pressure 167/70, pulse 81, temperature 98.7 F (37.1 C), temperature source Oral, resp. rate 20, height 5\' 4"  (1.626 m), weight 57.6 kg (126 lb 15.8 oz), SpO2 97.00%. Physical Exam  Nursing note  and vitals reviewed. Constitutional: He appears cachectic. He appears ill. No distress.  HENT:   Head: Normocephalic and atraumatic.  Eyes: Conjunctivae and EOM are normal. Pupils are equal, round, and reactive to light. Right eye exhibits no discharge. Left eye exhibits no discharge.  Neck: Normal range of motion. No JVD present. No tracheal deviation present. No thyromegaly present.  Cardiovascular: Normal rate.  An irregularly irregular rhythm present.  Pulmonary/Chest: Effort normal and breath sounds normal. No respiratory distress. He has no wheezes.  Abdominal: Soft. Bowel sounds are normal. He exhibits no distension.  Midline incision clean, dry and intact. Well approximated  Musculoskeletal:  Mild pedal edema bilaterally. Healed abrasions bilateral shins.   Lymphadenopathy:    He has no cervical adenopathy.  Neurological: He is alert.  Speech soft, dysarthric. Confused and rambling. Makes eye contact. Oriented to self and able to state age. Place --"[facility]/ [place]". Confabulation with poor recall.  Awareness and insight impaired. Moves all four with inconsistent effort. RUE with tremors. Able to follow basic commands.    Skin: Skin is warm and dry.  Psychiatric:  Limited insight and awareness. Fairly alert       Results for orders placed during the hospital encounter of 09/21/12 (from the past 48 hour(s))   GLUCOSE, CAPILLARY     Status: None     Collection Time      10/08/12  6:23 AM       Result  Value  Range     Glucose-Capillary  76   70 - 99 mg/dL      No results found.   Post Admission Physician Evaluation: Functional deficits secondary  to deconditioning, encephalopathy after AAA repair. Patient is admitted to receive collaborative, interdisciplinary care between the physiatrist, rehab nursing staff, and therapy team. Patient's level of medical complexity and substantial therapy needs in context of that medical necessity cannot be provided at a lesser intensity of  care such as a SNF. Patient has experienced substantial functional loss from his/her baseline which was documented above under the "Functional History" and "Functional Status" headings.  Judging by the patient's diagnosis, physical exam, and functional history, the patient has potential for functional progress which will result in measurable gains while on inpatient rehab.  These gains will be of substantial and practical use upon discharge  in facilitating mobility and self-care at the household level. Physiatrist will provide 24 hour management of medical needs as well as oversight of the therapy plan/treatment and provide guidance as appropriate regarding the interaction of the two. 24 hour rehab nursing will assist with bladder management, bowel management, safety, skin/wound care, disease management, medication administration, pain management and patient education  and help integrate therapy concepts, techniques,education, etc. PT will assess and treat for/with: Lower extremity strength, range of motion, stamina, balance, functional mobility, safety, adaptive  techniques and equipment, NMR, education for caregivers.   Goals are: min assist . OT will assess and treat for/with: ADL's, functional mobility, safety, upper extremity strength, adaptive techniques and equipment, NMR, education for caregivers.   Goals are: min to mod assist. SLP will assess and treat for/with: cognition, communication.  Goals are: supervision to min assist. Case Management and Social Worker will assess and treat for psychological issues and discharge planning. Team conference will be held weekly to assess progress toward goals and to determine barriers to discharge. Patient will receive at least 3 hours of therapy per day at least 5 days per week. ELOS: 2+ weeks (?decreased burden of care)      Prognosis:  good     Medical Problem List and Plan: 1. DVT Prophylaxis/Anticoagulation: Pharmaceutical: Lovenox 2. Pain  Management: Will monitor for symptoms. Denies any problems but used percocet at home per records.   3. Metabolic encephalopathy/Mood: Poor insight/ awareness due to ongoing confusion.   4. Neuropsych: This patient is not capable of making decisions on his own behalf. 5. Salmonella bacteremia: IV antibiotic D# 17/42.   6. Agitation: continue Seroquel at bedtime with prn ativan. May need to titrate for better control.   7. Diarrhea: Likely due to antibiotics. Will add probiotic. Po intake has been poor.   8. ABLA: Will recheck on Monday.    Ranelle Oyster, MD, [place]     10/08/2012

## 2012-10-09 NOTE — Evaluation (Signed)
Occupational Therapy Assessment and Plan  Patient Details  Name: Charles [name] MRN: [id number] Date of Birth: 10-03-1945  OT Diagnosis: altered mental status and muscle weakness (generalized) Rehab Potential: Rehab Potential: Good ELOS: 2 weeks   Today's Date: 10/09/2012 Time: 0800-0900 Time Calculation (min): 60 min  Problem List:  Patient Active Problem List   Diagnosis Date Noted  . Delirium 10/04/2012  . Salmonella bacteremia 09/25/2012  . Bacteremia 09/23/2012  . Acute respiratory failure 09/22/2012  . Hyponatremia 09/21/2012  . Adult failure to thrive 09/21/2012  . Abdominal pain 09/21/2012  . Dehydration 09/21/2012  . Generalized weakness 09/21/2012  . AAA (abdominal aortic aneurysm, ruptured) 09/21/2012  . New onset atrial fibrillation 09/21/2012  . Alcohol abuse 09/21/2012    Past Medical History:  Past Medical History  Diagnosis Date  . Hypercholesteremia   . Collapsed lung     s/p fall, rib fracture   Past Surgical History:  Past Surgical History  Procedure Laterality Date  . Hernia repair    . Pleural scarification      collapsed lung  . Abdominal aortic aneurysm repair N/A 09/21/2012    Procedure: ANEURYSM ABDOMINAL AORTIC REPAIR;  Surgeon: Sherren Kerns, MD;  Location: [facility] OR;  Service: Vascular;  Laterality: N/A;  . Patch angioplasty  09/21/2012    Procedure: PATCH ANGIOPLASTY;  Surgeon: Sherren Kerns, MD;  Location: [facility] OR;  Service: Vascular;;  Hemashield Patch Angioplasty fo Right Common Femoral Artery  . Embolectomy Right 09/21/2012    Procedure: EMBOLECTOMY;  Surgeon: Sherren Kerns, MD;  Location: [facility] OR;  Service: Vascular;  Laterality: Right;  Embolectomy of right leg.  . Wound debridement  09/24/2012    Procedure: Removal of Abdominal wound VAC, and Abdominal Closure;  Surgeon: Sherren Kerns, MD;  Location: [facility] OR;  Service: Vascular;;  . Femoral-popliteal bypass graft Right 09/30/2012    Procedure: BYPASS GRAFT FEMORAL-POPLITEAL ARTERY;   Surgeon: Fransisco Hertz, MD;  Location: [facility] OR;  Service: Vascular;  Laterality: Right;    Assessment & Plan Clinical Impression: Patient is a 67 y.o. year old male with recent admission to the hospital on with two month history of abdominal pain, low back pain with LLE weakness with problems walking and difficulty with oral intake. He was admitted via [facility] on 09/22/12 for treatment of ruptured AAA with hemorrhage extending into left iliopsoas muscle. He was taken to OR emergently for AAA repair with left common iliac to right common femoral bypass, aortic endarterectomy, right femoral endarterectomy profundaplasty, placement abdominal VAC.  Patient transferred to CIR on 10/08/2012 .    Patient currently requires Min Assist for grooming, Mod Assist for transfers and bathing, Total Assist for UB dressing/LB dressing and toileting secondary to muscle weakness and decreased awareness, decreased problem solving, decreased safety awareness and decreased memory.  Prior to hospitalization, patient could complete BADL  with independent .  Patient will benefit from skilled intervention to decrease level of assist with basic self-care skills prior to discharge TBD.  Anticipate patient will require 24 hour supervision and TBD.  OT - End of Session Activity Tolerance: Improving OT Assessment Rehab Potential: Good Barriers to Discharge: Other (comment) Barriers to Discharge Comments: unknown. Patient's chart states that patient will live with sister if she is able to handle him. OT Plan OT Intensity: Minimum of 1-2 x/day, 45 to 90 minutes OT Frequency: 5 out of 7 days OT Duration/Estimated Length of Stay: 2 weeks OT Treatment/Interventions: Balance/vestibular training;Self Care/advanced ADL retraining;UE/LE Coordination activities;Functional  mobility training;Community reintegration;Therapeutic Activities;Discharge planning;Patient/family education;Therapeutic Exercise;UE/LE Strength taining/ROM;DME/adaptive  equipment instruction OT Recommendation Patient destination:  (TBD) Follow Up Recommendations: 24 hour supervision/assistance Equipment Recommended:  (TBD)   Skilled Therapeutic Intervention Pt at nurse's desk with quick release belt and mittens on. Patient alert and participated in ADL at sink level. Patient followed 1 and 2 step commands consistently. Pt did not have any periods of agitation. Pt is only oriented to self. Inquired about home situation and previous functional performance. Unsure how accurate information was that was given to therapist. Pt seems to be a poor historian as he stated that he lives with his father.   OT Evaluation Precautions/Restrictions  Precautions Precautions: Fall Pain Pain Assessment Pain Assessment: No/denies pain Home Living/Prior Functioning Home Living Lives With: Family Available Help at Discharge: Family;Available 24 hours/day (Patient will live with sister if she's able to care for him) Type of Home: House Home Access: Stairs to enter [employer] of Steps: several Home Layout: Multi-level Bathroom Shower/Tub: Tub/shower unit Home Adaptive Equipment: Tub transfer bench;Walker - rolling Additional Comments: Unknown how accurate information is that patient gave therapist. Prior Function Able to Take Stairs?: Yes Driving:  (unknown) Vocation: Retired Gaffer: Scientist, water quality - History Baseline Vision: Wears glasses only for reading Patient Visual Report: No change from baseline  Cognition Overall Cognitive Status: Impaired/Different from baseline Arousal/Alertness: Awake/alert Orientation Level: Oriented to person;Disoriented to place;Disoriented to time;Disoriented to situation Attention: Sustained Sustained Attention: Appears intact Memory: Impaired Awareness: Impaired Problem Solving: Impaired Safety/Judgment: Impaired Comments: Patient had mittens on and a quick release belt. Pt  is to be at nurse's desk for supervision. Patient has periods of agitation and combativeness Sensation Sensation Light Touch: Not tested Stereognosis: Not tested Hot/Cold: Not tested Proprioception: Not tested Coordination Gross Motor Movements are Fluid and Coordinated: Yes Fine Motor Movements are Fluid and Coordinated: No Coordination and Movement Description: Impaired fine motor coordination due to bilateral UE tremors. Finger Nose Finger Test: not tested Motor  Motor Motor: Within Functional Limits Mobility  Transfers Sit to Stand: 3: Mod assist;From chair/3-in-1;Without upper extremity assist Stand to Sit: 4: Min assist;To chair/3-in-1;Without upper extremity assist  Extremity/Trunk Assessment RUE Assessment RUE Assessment: Exceptions to [facility] RUE Strength Right Shoulder Flexion: 4/5 Right Shoulder Horizontal ABduction: 4/5 Right Shoulder Horizontal ADduction: 4/5 Right Elbow Flexion: 4/5 Right Elbow Extension: 4/5 Gross Grasp: Functional LUE Assessment LUE Assessment: Exceptions to [facility] LUE Strength Left Shoulder Flexion: 4/5 Left Shoulder Horizontal ABduction: 4/5 Left Shoulder Horizontal ADduction: 4/5 Left Elbow Flexion: 4/5 Left Elbow Extension: 4/5 Gross Grasp: Functional  FIM:  FIM - Eating Eating Activity: 0: Activity did not occur FIM - Grooming Grooming Steps: Wash, rinse, dry face;Wash, rinse, dry hands;Oral care, brush teeth, clean dentures Grooming: 4: Steadying assist  or patient completes 3 of 4 or 4 of 5 steps FIM - Bathing Bathing Steps Patient Completed: Chest;Right Arm;Left Arm;Abdomen;Front perineal area;Right upper leg;Left upper leg Bathing: 3: Mod-Patient completes 5-7 52f 10 parts or 50-74% FIM - Upper Body Dressing/Undressing Upper body dressing/undressing: 0: Wears gown/pajamas-no public clothing FIM - Lower Body Dressing/Undressing Lower body dressing/undressing steps patient completed: Don/Doff right sock;Don/Doff left sock Lower body  dressing/undressing: 0: Wears gown/pajamas-no public clothing FIM - Toileting Toileting: 0: No continent bowel/bladder events this shift FIM - Archivist Transfers: 3-From toilet/BSC: Mod A (lift or lower assist);3-To toilet/BSC: Mod A (lift or lower assist) FIM - Tub/Shower Transfers Tub/shower Transfers: 0-Activity did not occur or was simulated   Refer  to Care Plan for Long Term Goals  Recommendations for other services: None  Discharge Criteria: Patient will be discharged from OT if patient refuses treatment 3 consecutive times without medical reason, if treatment goals not met, if there is a change in medical status, if patient makes no progress towards goals or if patient is discharged from hospital.  The above assessment, treatment plan, treatment alternatives and goals were discussed and mutually agreed upon: by patient  Limmie Patricia, OTR/L,CBIS   10/09/2012, 9:27 AM

## 2012-10-09 NOTE — Evaluation (Signed)
Speech Language Pathology Assessment and Plan  Patient Details  Name: Charles [name] MRN: [id number] Date of Birth: 1946-02-15  SLP Diagnosis: Dysarthria;Cognitive Impairments  Rehab Potential: Fair ELOS: 2 weeks   Today's Date: 10/09/2012 Time: 1330-1430 Time Calculation (min): 60 min  Problem List:  Patient Active Problem List   Diagnosis Date Noted  . Delirium 10/04/2012  . Salmonella bacteremia 09/25/2012  . Bacteremia 09/23/2012  . Acute respiratory failure 09/22/2012  . Hyponatremia 09/21/2012  . Adult failure to thrive 09/21/2012  . Abdominal pain 09/21/2012  . Dehydration 09/21/2012  . Generalized weakness 09/21/2012  . AAA (abdominal aortic aneurysm, ruptured) 09/21/2012  . New onset atrial fibrillation 09/21/2012  . Alcohol abuse 09/21/2012   Past Medical History:  Past Medical History  Diagnosis Date  . Hypercholesteremia   . Collapsed lung     s/p fall, rib fracture   Past Surgical History:  Past Surgical History  Procedure Laterality Date  . Hernia repair    . Pleural scarification      collapsed lung  . Abdominal aortic aneurysm repair N/A 09/21/2012    Procedure: ANEURYSM ABDOMINAL AORTIC REPAIR;  Surgeon: Sherren Kerns, MD;  Location: [facility] OR;  Service: Vascular;  Laterality: N/A;  . Patch angioplasty  09/21/2012    Procedure: PATCH ANGIOPLASTY;  Surgeon: Sherren Kerns, MD;  Location: [facility] OR;  Service: Vascular;;  Hemashield Patch Angioplasty fo Right Common Femoral Artery  . Embolectomy Right 09/21/2012    Procedure: EMBOLECTOMY;  Surgeon: Sherren Kerns, MD;  Location: [facility] OR;  Service: Vascular;  Laterality: Right;  Embolectomy of right leg.  . Wound debridement  09/24/2012    Procedure: Removal of Abdominal wound VAC, and Abdominal Closure;  Surgeon: Sherren Kerns, MD;  Location: [facility] OR;  Service: Vascular;;  . Femoral-popliteal bypass graft Right 09/30/2012    Procedure: BYPASS GRAFT FEMORAL-POPLITEAL ARTERY;  Surgeon: Fransisco Hertz, MD;   Location: [facility] OR;  Service: Vascular;  Laterality: Right;    Assessment / Plan / Recommendation Clinical Impression  Charles [name] is a 67 y.o. male with two month history of abdominal pain, low back pain with LLE weakness with problems walking and difficulty with oral intake. He was admitted via [facility] on 09/22/12 for treatment of ruptured AAA with hemorrhage extending into left muscle. He was taken to OR emergently for AAA repair with left common iliac to right common femoral bypass, aortic endarterectomy, right femoral endarterectomy profundaplasty, placement abdominal VAC. Placed on IV zosyn. Abdominal wound closure done on 04/25. BC/UCS positive for salmonella and ID consulted for input on treatment course. Dr. Ninetta Lights recommends IV ceftriaxone for at least 6 weeks followed by p.o. fluoroquinolone due to concerns of mycotic aneurysm. Patient developed RLE ischemia requiring Right aortic graft limb to below knee bypass on 05/01. Has had confusion due to encephalopathy limiting extubation. He was extubated on 05/05 and continues to have issues with confusion/paranoia. Has had significant diarrhea requiring rectal tube. C-diff check 5/4 negative. Diet advanced to Regular textures. Therapies initiated and patient needs multiple cues for basic tasks, is deconditioned and working on pre-gait activities. Therapy team recommending CIR; patient admitted 10/08/12. Upon evaluation patient presents with decreased oral coordination impacting mastication and speech intelligibility.  Patient also presents with cognitive impairments characterized by decreased sustained attention, orientation, awareness, recall and perseveration which impact his ability to safely complete basic self-care tasks. As a result, it is recommended that this patient receive skilled SLP services during CIR stay to address these  deficits, maximize functional independence and reduce burden of care upon discharge. SLP educated son regarding  recommendation/need for 24/7 supervision at discharge; son reported that they were working on plan and it still needed to be figured out.     SLP Assessment  Patient will need skilled Speech Lanaguage Pathology Services during CIR admission    Recommendations  Diet Recommendations: Regular;Thin liquid Liquid Administration via: Cup;No straw Medication Administration: Whole meds with puree Supervision: Patient able to self feed;Full supervision/cueing for compensatory strategies Compensations: Slow rate;Small sips/bites;Follow solids with liquid Postural Changes and/or Swallow Maneuvers: Out of bed for meals;Seated upright 90 degrees Oral Care Recommendations: Oral care BID Patient destination: Home Follow up Recommendations: 24 hour supervision/assistance;Outpatient SLP Equipment Recommended: None recommended by SLP    SLP Frequency 5 out of 7 days   SLP Treatment/Interventions Cognitive remediation/compensation;Cueing hierarchy;Environmental controls;Functional tasks;Internal/external aids;Medication managment;Patient/family education;Therapeutic Activities    Pain Pain Assessment Pain Assessment: No/denies pain Prior Functioning Cognitive/Linguistic Baseline: Within functional limits 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 SLP Short Term Goal 2 (Week 1): Patient will verbalize orientation with Mod assist question cues. SLP Short Term Goal 3 (Week 1): Patient will request help as needed with call bell with Max assist verbal cues. SLP Short Term Goal 4 (Week 1): Patient sustain attention to basic, familiar task for 5 minutes with Mod assist cues for re-direction. SLP Short Term Goal 5 (Week 1): Patient willl increase speech intelligibility at the phrase level with Min assist verbal  cues.  See FIM for current functional status Refer to Care Plan for Long Term Goals  Recommendations for other services: None  Discharge Criteria: Patient will be discharged from SLP if patient refuses treatment 3 consecutive times without medical reason, if treatment goals not met, if there is a change in medical status, if patient makes no progress towards goals or if patient is discharged from hospital.  The above assessment, treatment plan, treatment alternatives and goals were discussed and mutually agreed upon: by patient and by family  Charlane Ferretti., CCC-SLP [phone]  Keenan Dimitrov 10/09/2012, 3:43 PM

## 2012-10-10 ENCOUNTER — Inpatient Hospital Stay (HOSPITAL_COMMUNITY): Payer: Medicare Other

## 2012-10-10 ENCOUNTER — Inpatient Hospital Stay (HOSPITAL_COMMUNITY): Payer: Medicare Other | Admitting: *Deleted

## 2012-10-10 DIAGNOSIS — R627 Adult failure to thrive: Secondary | ICD-10-CM

## 2012-10-10 NOTE — Progress Notes (Signed)
Occupational Therapy Session Note  Patient Details  Name: Charles [name] MRN: [id number] Date of Birth: [birth year]  Today's Date: 10/10/2012 Time: 0810-0855 Time Calculation (min): 45 min  Short Term Goals: Week 1:  OT Short Term Goal 1 (Week 1): Patient will increase grooming to supervision. OT Short Term Goal 2 (Week 1): Patient will increase UB dressing to Min Assist. OT Short Term Goal 3 (Week 1): Patient will increase LB dressing to Max Assist. OT Short Term Goal 4 (Week 1): Patient will increase toileting to Mod Assist. OT Short Term Goal 5 (Week 1): Patient will increase toilet transfer to Min Assist.  Skilled Therapeutic Interventions/: ADL-retraining with emphasis on self-feeding, toilet transfers, sustained attention, and functional mobility using RW.   Patient was encountered with ad-hoc group dining activity in day room, observed attempting to self-feed, although spilling food d/t tremors.   Patient was educated on use of wrist weight and weighted spoon which he accepted and claimed helped (benefit appeared minimal).  Patient responded appropriately and reported that he has adjusted to his tremors over time.   Self-feeding was terminated after approx 10 min to progress to ADL.   Patient transferred from w/c and ambulated to bathoom, approx 15', with steadying assist required and LOB noted X2 during ambulation.   Patient required min assist for toilet transfer with verbal cues to use grab bars and to assist with removing hospital gown. Patient then related that it was too cold to shower but he accepted a pan bath while he remained seated on toilet.   Patient completed washing of face, hands and buttocks but ceased bathing d/t fatigue and requested transfer back to bed.   Bed enclosure closed for patient safety, call light within reach.    Therapy Documentation Precautions:  Precautions Precautions: Fall Restrictions Weight Bearing Restrictions: No  Pain: Pain Assessment Pain  Assessment: No/denies pain  See FIM for current functional status  Therapy/Group: Individual Therapy  Georgeanne Nim 10/10/2012, 8:59 AM

## 2012-10-10 NOTE — Progress Notes (Signed)
Pt has been calm and more appropriate today with less confusion noted than yesterday. Alert to person and place, disoriented to time and situation. Appetite improved, pt stated "I think my appetite is coming back". Cont of bladder and able to communicate toileting needs when sitting at the desk. Tolerating enclosure bed well. Participated in therapy this morning and had a friend visit at lunchtime; stated he wanted to take his friend around the unit to "see the rooms he's worked in". Continue with plan of care. Mick Sell, RN

## 2012-10-10 NOTE — Progress Notes (Signed)
Patient ID: Charles [name], male   DOB: 1946/03/14, 68 y.o.   MRN: [id number]  Patient ID: Charles [name], male   DOB: 12-04-1945, 67 y.o.   MRN: [id number]  49/41.  67 year old patient admitted to the rehabilitation service  2 days ago with severe deconditioning following treatment of a ruptured AAA approximately 3 weeks ago. Hospital course complicated by Salmonella bacteremia and he is completing IV Rocephin. He has a history of LV dysfunction, history of alcohol abuse he continues to have issues with confusion and intermittent paranoia. He remains quite weak but his delirium has improved.  Enclosed bed  BP Readings from Last 3 Encounters:  10/10/12 153/68  10/08/12 159/92  10/08/12 159/92   Gen- weak but alert. Offer no complaints ; HEENT- poor dental hygiene with numerous missing dentition ; chest clear anterolaterally ; cardiovascular rhythm is irregular with a controlled ventricular response ; abdomen soft nondistended ; extremities no edema   Impression- severe deconditioning Status post AAA rupture History of Salmonella bacteremia Toxic metabolic encephalopathy slowly improving  Plan- continue IV Rocephin,CIR Continue DVT prophylaxis with Lovenox    Author: Ranelle Oyster, MD Service: Physical Medicine and Rehabilitation Author Type: Physician    Filed: 10/08/2012  2:42 PM Note Time: 10/08/2012 10:16 AM           Physical Medicine and Rehabilitation Admission H&P      Chief Complaint   Patient presents with   .  Deconditioning. Metabolic encephalopathy.     : HPI:  Charles [name] is a 67 y.o. male with two month history of abdominal pain, low back pain with LLE weakness with problems walking and difficulty with oral intake. He was admitted via [facility] on 09/22/12 for treatment of ruptured AAA with hemorrhage extending into left iliopsoas muscle. He was taken to OR emergently for AAA repair with left common iliac to right common femoral bypass, aortic endarterectomy, right femoral  endarterectomy profundaplasty, placement abdominal VAC. Placed on IV zosyn. Abdominal wound closure done on 04/25. BC/UCS positive for salmonella and ID consulted for input on treatment course. Dr. 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    2D echo with diffuse hypokinesis with EF 35-40%. Follow up BC negative. Patient developed RLE ischemia requiring Right aortic graft limb to below knee bypass on 05/01. Has had confusion due to encephalopathy limiting extubation. He was extubated on 05/05 and continues to have issues with confusion/paranoia. Has had signifcant diarrhea requring rectal tube. C-diff check 5/4 negative. Diet advanced to  Regular. Therapies initiated and patient needs multiple cues for basic tasks,is deconditioned and working on pre-gait activities. . Therapy team recommending CIR.      Review of Systems  Unable to perform ROS: mental acuity     Past Medical History   Diagnosis  Date   .  Hypercholesteremia     .  Collapsed lung         s/p fall, rib fracture       Past Surgical History   Procedure  Laterality  Date   .  Hernia repair       .  Pleural scarification           collapsed lung   .  Abdominal aortic aneurysm repair  N/A  09/21/2012       Procedure: ANEURYSM ABDOMINAL AORTIC REPAIR;  Surgeon: Sherren Kerns, MD;  Location: [facility] OR;  Service: Vascular;  Laterality: N/A;   .  Patch angioplasty    09/21/2012       Procedure: PATCH ANGIOPLASTY;  Surgeon: Sherren Kerns, MD;  Location: [facility] OR;  Service: Vascular;;  [place] Patch Angioplasty fo Right Common Femoral Artery   .  Embolectomy  Right  09/21/2012       Procedure: EMBOLECTOMY;  Surgeon: Sherren Kerns, MD;  Location: [facility] OR;  Service: Vascular;  Laterality: Right;  Embolectomy of right leg.   .  Wound debridement    09/24/2012       Procedure: Removal of Abdominal wound VAC, and Abdominal Closure;  Surgeon: Sherren Kerns,  MD;  Location: [facility] OR;  Service: Vascular;;   .  Femoral-popliteal bypass graft  Right  09/30/2012       Procedure: BYPASS GRAFT FEMORAL-POPLITEAL ARTERY;  Surgeon: Fransisco Hertz, MD;  Location: [facility] OR;  Service: Vascular;  Laterality: Right;       Family History   Problem  Relation  Age of Onset   .  Diabetes  Mother     .  Diabetes  Father        Social History: Lives alone in [place]. He reports that he has been smoking Cigarettes--1/2 PPD? He has been smoking about 0.00 packs per day. He does not have any smokeless tobacco history on file. He reports that drinks alcohol ~ 1/2 pint daily?. He reports that he does not use illicit drugs.      Allergies: No Known Allergies    Medications Prior to Admission   Medication  Sig  Dispense  Refill   .  oxyCODONE-acetaminophen (PERCOCET/ROXICET) 5-325 MG per tablet  Take 1 tablet by mouth every 4 (four) hours as needed for pain.         .  [DISCONTINUED] ibuprofen (ADVIL,MOTRIN) 200 MG tablet  Take 400 mg by mouth every 6 (six) hours as needed for pain.              Home: Home Living Lives With: Family Available Help at Discharge: Family;Available 24 hours/day;Other (Comment) (trying to arrange 24 hour care) Type of Home: House Home Access: Stairs to enter Entrance Stairs-Number of Steps: several Home Layout: One level Home Adaptive Equipment: None Additional Comments: TBA (pt confused and unable to report)    Functional History: Prior Function Able to Take Stairs?: Yes Driving:  (Could drive, but truck needs repairs.) Vocation: Retired   Functional Status:   Mobility: Bed Mobility Bed Mobility: Right Sidelying to Sit;Rolling Right;Sitting - Scoot to [place] of Bed Rolling Right: 4: Min assist;With rail Rolling Left: 4: Min assist Right Sidelying to Sit: 4: Min assist Left Sidelying to Sit: 4: Min assist Sitting - Scoot to [place] of Bed: 4: Min assist Transfers Transfers: Sit to Stand;Stand to [employer] Transfers Sit  to Stand: 1: +2 Total assist;With upper extremity assist;From bed Sit to Stand: Patient Percentage: 50% Stand to Sit: 1: +2 Total assist;With upper extremity assist;To chair/3-in-1;With armrests Stand to Sit: Patient Percentage: 30% Stand Pivot Transfers: 1: +2 Total assist Stand Pivot Transfers: Patient Percentage: 50% Ambulation/Gait Ambulation/Gait Assistance: Not tested (comment) Stairs: No Wheelchair Mobility Wheelchair Mobility: No   ADL: ADL Grooming: Maximal assistance Where Assessed - Grooming: Supine, head of bed up Upper Body Dressing: +1 Total assistance Where Assessed - Upper Body Dressing: Supine, head of bed up Lower Body Dressing: +1 Total assistance Where Assessed - Lower Body Dressing: Supine, head of bed up Transfers/Ambulation Related to ADLs: NT on eval due to lack of +2 assistance  ADL Comments: Patient presents confused, needs cues throughout for completion of ADLs.   Cognition: Cognition Overall Cognitive Status: Impaired/Different from baseline Arousal/Alertness: Awake/alert Orientation Level: Oriented to person;Disoriented to time;Oriented to place Cognition Arousal/Alertness: Awake/alert Behavior During Therapy: Flat affect Overall Cognitive Status: Impaired/Different from baseline Area of Impairment: Orientation;Attention;Memory;Following commands;Safety/judgement;Awareness;Problem solving Orientation Level: Disoriented to;Place;Time;Situation Current Attention Level: Focused Memory: Decreased recall of precautions Following Commands: Follows one step commands inconsistently;Follows one step commands with increased time Safety/Judgement: Decreased awareness of safety;Decreased awareness of deficits Problem Solving: Slow processing;Decreased initiation;Difficulty sequencing;Requires verbal cues;Requires tactile cues   Physical Exam: Blood pressure 167/70, pulse 81, temperature 98.7 F (37.1 C), temperature source Oral, resp. rate 20, height 5\' 4"   (1.626 m), weight 57.6 kg (126 lb 15.8 oz), SpO2 97.00%. Physical Exam  Nursing note and vitals reviewed. Constitutional: He appears cachectic. He appears ill. No distress.  HENT:   Head: Normocephalic and atraumatic.  Eyes: Conjunctivae and EOM are normal. Pupils are equal, round, and reactive to light. Right eye exhibits no discharge. Left eye exhibits no discharge.  Neck: Normal range of motion. No JVD present. No tracheal deviation present. No thyromegaly present.  Cardiovascular: Normal rate.  An irregularly irregular rhythm present.  Pulmonary/Chest: Effort normal and breath sounds normal. No respiratory distress. He has no wheezes.  Abdominal: Soft. Bowel sounds are normal. He exhibits no distension.  Midline incision clean, dry and intact. Well approximated  Musculoskeletal:  Mild pedal edema bilaterally. Healed abrasions bilateral shins.   Lymphadenopathy:    He has no cervical adenopathy.  Neurological: He is alert.  Speech soft, dysarthric. Confused and rambling. Makes eye contact. Oriented to self and able to state age. Place --"[facility]/ [place]". Confabulation with poor recall.  Awareness and insight impaired. Moves all four with inconsistent effort. RUE with tremors. Able to follow basic commands.    Skin: Skin is warm and dry.  Psychiatric:  Limited insight and awareness. Fairly alert       Results for orders placed during the hospital encounter of 09/21/12 (from the past 48 hour(s))   GLUCOSE, CAPILLARY     Status: None     Collection Time      10/08/12  6:23 AM       Result  Value  Range     Glucose-Capillary  76   70 - 99 mg/dL      No results found.   Post Admission Physician Evaluation: Functional deficits secondary  to deconditioning, encephalopathy after AAA repair. Patient is admitted to receive collaborative, interdisciplinary care between the physiatrist, rehab nursing staff, and therapy team. Patient's level of medical complexity and substantial therapy  needs in context of that medical necessity cannot be provided at a lesser intensity of care such as a SNF. Patient has experienced substantial functional loss from his/her baseline which was documented above under the "Functional History" and "Functional Status" headings.  Judging by the patient's diagnosis, physical exam, and functional history, the patient has potential for functional progress which will result in measurable gains while on inpatient rehab.  These gains will be of substantial and practical use upon discharge  in facilitating mobility and self-care at the household level. Physiatrist will provide 24 hour management of medical needs as well as oversight of the therapy plan/treatment and provide guidance as appropriate regarding the interaction of the two. 24 hour rehab nursing will assist with bladder management, bowel management, safety, skin/wound care, disease management, medication administration, pain management and patient education  and help integrate therapy concepts,  techniques,education, etc. PT will assess and treat for/with: Lower extremity strength, range of motion, stamina, balance, functional mobility, safety, adaptive techniques and equipment, NMR, education for caregivers.   Goals are: min assist . OT will assess and treat for/with: ADL's, functional mobility, safety, upper extremity strength, adaptive techniques and equipment, NMR, education for caregivers.   Goals are: min to mod assist. SLP will assess and treat for/with: cognition, communication.  Goals are: supervision to min assist. Case Management and Social Worker will assess and treat for psychological issues and discharge planning. Team conference will be held weekly to assess progress toward goals and to determine barriers to discharge. Patient will receive at least 3 hours of therapy per day at least 5 days per week. ELOS: 2+ weeks (?decreased burden of care)      Prognosis:  good     Medical Problem List  and Plan: 1. DVT Prophylaxis/Anticoagulation: Pharmaceutical: Lovenox 2. Pain Management: Will monitor for symptoms. Denies any problems but used percocet at home per records.   3. Metabolic encephalopathy/Mood: Poor insight/ awareness due to ongoing confusion.   4. Neuropsych: This patient is not capable of making decisions on his own behalf. 5. Salmonella bacteremia: IV antibiotic D# 17/42.   6. Agitation: continue Seroquel at bedtime with prn ativan. May need to titrate for better control.   7. Diarrhea: Likely due to antibiotics. Will add probiotic. Po intake has been poor.   8. ABLA: Will recheck on Monday.    Charles Oyster, MD, [place] [facility]     10/08/2012

## 2012-10-11 ENCOUNTER — Inpatient Hospital Stay (HOSPITAL_COMMUNITY): Payer: Medicare Other

## 2012-10-11 ENCOUNTER — Encounter (HOSPITAL_COMMUNITY): Payer: Medicare Other

## 2012-10-11 ENCOUNTER — Telehealth: Payer: Self-pay | Admitting: Vascular Surgery

## 2012-10-11 DIAGNOSIS — G9341 Metabolic encephalopathy: Secondary | ICD-10-CM | POA: Diagnosis present

## 2012-10-11 DIAGNOSIS — R5381 Other malaise: Secondary | ICD-10-CM

## 2012-10-11 DIAGNOSIS — D62 Acute posthemorrhagic anemia: Secondary | ICD-10-CM | POA: Diagnosis present

## 2012-10-11 LAB — CBC WITH DIFFERENTIAL/PLATELET
Eosinophils Absolute: 0.1 10*3/uL (ref 0.0–0.7)
Eosinophils Relative: 1 % (ref 0–5)
HCT: 28.1 % — ABNORMAL LOW (ref 39.0–52.0)
Lymphocytes Relative: 32 % (ref 12–46)
Lymphs Abs: 2.1 10*3/uL (ref 0.7–4.0)
MCH: 30.3 pg (ref 26.0–34.0)
MCV: 88.6 fL (ref 78.0–100.0)
Monocytes Absolute: 0.5 10*3/uL (ref 0.1–1.0)
Platelets: 219 10*3/uL (ref 150–400)
RBC: 3.17 MIL/uL — ABNORMAL LOW (ref 4.22–5.81)
RDW: 15.7 % — ABNORMAL HIGH (ref 11.5–15.5)
WBC: 6.5 10*3/uL (ref 4.0–10.5)

## 2012-10-11 LAB — COMPREHENSIVE METABOLIC PANEL
CO2: 26 mEq/L (ref 19–32)
Calcium: 7.8 mg/dL — ABNORMAL LOW (ref 8.4–10.5)
Creatinine, Ser: 0.75 mg/dL (ref 0.50–1.35)
GFR calc Af Amer: >90 mL/min (ref >90)
GFR calc non Af Amer: >90 mL/min (ref >90)
Glucose, Bld: 150 mg/dL — ABNORMAL HIGH (ref 70–99)
Sodium: 137 mEq/L (ref 135–145)
Total Protein: 5.8 g/dL — ABNORMAL LOW (ref 6.0–8.3)

## 2012-10-11 LAB — URINALYSIS, ROUTINE W REFLEX MICROSCOPIC
Glucose, UA: NEGATIVE mg/dL
Leukocytes, UA: NEGATIVE
Nitrite: NEGATIVE
Specific Gravity, Urine: 1.025 (ref 1.005–1.030)
pH: 5.5 (ref 5.0–8.0)

## 2012-10-11 MED ORDER — ADULT MULTIVITAMIN W/MINERALS CH
1.0000 | ORAL_TABLET | Freq: Every day | ORAL | Status: DC
  Administered 2012-10-11 – 2012-10-19 (×9): 1 via ORAL
  Filled 2012-10-11 (×11): qty 1

## 2012-10-11 MED ORDER — POTASSIUM CHLORIDE CRYS ER 20 MEQ PO TBCR
40.0000 meq | EXTENDED_RELEASE_TABLET | Freq: Two times a day (BID) | ORAL | Status: DC
  Administered 2012-10-11 – 2012-10-12 (×3): 40 meq via ORAL
  Filled 2012-10-11 (×5): qty 2

## 2012-10-11 MED ORDER — ENSURE COMPLETE PO LIQD
120.0000 mL | Freq: Three times a day (TID) | ORAL | Status: DC
  Administered 2012-10-11 – 2012-10-14 (×8): 120 mL via ORAL

## 2012-10-11 NOTE — Progress Notes (Signed)
Patient ID: Charles [name], male   DOB: August 19, 1945, 67 y.o.   MRN: [id number] Subjective/Complaints: 67 year old patient admitted to the rehabilitation service  10/08/12 with severe deconditioning following treatment of a ruptured AAA approximately 3 weeks ago. Hospital course complicated by Salmonella bacteremia and he is completing IV Rocephin. He has a history of LV dysfunction, history of alcohol abuse he continues to have issues with confusion and intermittent paranoia. He remains quite weak but his delirium has improved.  Enclosed bed  Review of Systems  Unable to perform ROS: mental acuity   Objective: Vital Signs: Blood pressure 130/67, pulse 70, temperature 98.6 F (37 C), temperature source Oral, resp. rate 18, SpO2 96.00%. No results found. Results for orders placed during the hospital encounter of 10/08/12 (from the past 72 hour(s))  URINALYSIS, ROUTINE W REFLEX MICROSCOPIC     Status: Abnormal   Collection Time    10/11/12 12:20 AM      Result Value Range   Color, Urine YELLOW  YELLOW   APPearance CLEAR  CLEAR   Specific Gravity, Urine 1.025  1.005 - 1.030   pH 5.5  5.0 - 8.0   Glucose, UA NEGATIVE  NEGATIVE mg/dL   Hgb urine dipstick NEGATIVE  NEGATIVE   Bilirubin Urine NEGATIVE  NEGATIVE   Ketones, ur 15 (*) NEGATIVE mg/dL   Protein, ur NEGATIVE  NEGATIVE mg/dL   Urobilinogen, UA 0.2  0.0 - 1.0 mg/dL   Nitrite NEGATIVE  NEGATIVE   Leukocytes, UA NEGATIVE  NEGATIVE   Comment: MICROSCOPIC NOT DONE ON URINES WITH NEGATIVE PROTEIN, BLOOD, LEUKOCYTES, NITRITE, OR GLUCOSE <1000 mg/dL.  CBC WITH DIFFERENTIAL     Status: Abnormal   Collection Time    10/11/12  6:05 AM      Result Value Range   WBC 6.5  4.0 - 10.5 K/uL   RBC 3.17 (*) 4.22 - 5.81 MIL/uL   Hemoglobin 9.6 (*) 13.0 - 17.0 g/dL   HCT 78.2 (*) 95.6 - 21.3 %   MCV 88.6  78.0 - 100.0 fL   MCH 30.3  26.0 - 34.0 pg   MCHC 34.2  30.0 - 36.0 g/dL   RDW 08.6 (*) 57.8 - 46.9 %   Platelets 219  150 - 400 K/uL   Neutrophils Relative 59  43 - 77 %   Neutro Abs 3.8  1.7 - 7.7 K/uL   Lymphocytes Relative 32  12 - 46 %   Lymphs Abs 2.1  0.7 - 4.0 K/uL   Monocytes Relative 8  3 - 12 %   Monocytes Absolute 0.5  0.1 - 1.0 K/uL   Eosinophils Relative 1  0 - 5 %   Eosinophils Absolute 0.1  0.0 - 0.7 K/uL   Basophils Relative 1  0 - 1 %   Basophils Absolute 0.0  0.0 - 0.1 K/uL  COMPREHENSIVE METABOLIC PANEL     Status: Abnormal   Collection Time    10/11/12  6:05 AM      Result Value Range   Sodium 137  135 - 145 mEq/L   Potassium 2.6 (*) 3.5 - 5.1 mEq/L   Comment: CRITICAL RESULT CALLED TO, READ BACK BY AND VERIFIED WITH:     VAUGHN G RN 10/11/12 0730 COSTELLO B   Chloride 102  96 - 112 mEq/L   CO2 26  19 - 32 mEq/L   Glucose, Bld 150 (*) 70 - 99 mg/dL   BUN 15  6 - 23 mg/dL   Creatinine, Ser 6.29  0.50 - 1.35 mg/dL   Calcium 7.8 (*) 8.4 - 10.5 mg/dL   Total Protein 5.8 (*) 6.0 - 8.3 g/dL   Albumin 1.9 (*) 3.5 - 5.2 g/dL   AST 70 (*) 0 - 37 U/L   ALT 81 (*) 0 - 53 U/L   Alkaline Phosphatase 68  39 - 117 U/L   Total Bilirubin 0.4  0.3 - 1.2 mg/dL   GFR calc non Af Amer >90  >90 mL/min   GFR calc Af Amer >90  >90 mL/min   Comment:            The eGFR has been calculated     using the CKD EPI equation.     This calculation has not been     validated in all clinical     situations.     eGFR's persistently     <90 mL/min signify     possible Chronic Kidney Disease.     HEENT: poor dentition Cardio: RRR and no murmurs Resp: CTA B/L and breathing unlabored GI: BS positive and nondistended Extremity:  No Edema Skin:   Wound C/D/I Neuro: Alert/Oriented, Confused, Cranial Nerve II-XII normal and Abnormal Motor 4/5 bilateral deltoid, biceps, triceps, grip, hip flexor, knee extensors, ankle dorsiflexor plantar flexor Musc/Skel:  Other atrophy of both upper and lower extremity  Gen. no acute distress   Assessment/Plan: 1. Functional deficits secondary to Deconditioning which require 3+ hours  per day of interdisciplinary therapy in a comprehensive inpatient rehab setting. Physiatrist is providing close team supervision and 24 hour management of active medical problems listed below. Physiatrist and rehab team continue to assess barriers to discharge/monitor patient progress toward functional and medical goals. FIM: FIM - Bathing Bathing Steps Patient Completed: Buttocks Bathing: 0: Activity did not occur  FIM - Upper Body Dressing/Undressing Upper body dressing/undressing: 0: Activity did not occur FIM - Lower Body Dressing/Undressing Lower body dressing/undressing steps patient completed: Don/Doff right sock;Don/Doff left sock Lower body dressing/undressing: 0: Activity did not occur  FIM - Toileting Toileting steps completed by patient: Adjust clothing prior to toileting Toileting Assistive Devices: Grab bar or rail for support Toileting: 0: Activity did not occur  FIM - Diplomatic Services operational officer Devices: Grab bars;[employer] consultant Transfers: 0-Activity did not occur  FIM - Midwife: Environmental consultant;Bed rails Bed/Chair Transfer: 0: Activity did not occur  FIM - Locomotion: Wheelchair Locomotion: Wheelchair: 0: Activity did not occur FIM - Locomotion: Ambulation Ambulation/Gait Assistance: 1: +2 Total assist Locomotion: Ambulation: 0: Activity did not occur  Comprehension Comprehension Mode: Auditory Comprehension: 4-Understands basic 75 - 89% of the time/requires cueing 10 - 24% of the time  Expression Expression Mode: Verbal Expression: 4-Expresses basic 75 - 89% of the time/requires cueing 10 - 24% of the time. Needs helper to occlude trach/needs to repeat words.  Social Interaction Social Interaction: 3-Interacts appropriately 50 - 74% of the time - May be physically or verbally inappropriate.  Problem Solving Problem Solving: 2-Solves basic 25 - 49% of the time - needs direction more than half the time  to initiate, plan or complete simple activities  Memory Memory: 2-Recognizes or recalls 25 - 49% of the time/requires cueing 51 - 75% of the time  Medical Problem List and Plan:  1. DVT Prophylaxis/Anticoagulation: Pharmaceutical: Lovenox  2. Pain Management: Will monitor for symptoms. Denies any problems but used percocet at home per records.  3. Metabolic encephalopathy/Mood: Poor insight/ awareness due to ongoing confusion.  4. Neuropsych: This  patient is not capable of making decisions on his own behalf.  5. Salmonella bacteremia: IV antibiotic D# 17/42.  6. Agitation: continue Seroquel at bedtime with prn ativan. May need to titrate for better control.  7. Diarrhea: Likely due to antibiotics. Will add probiotic. Po intake has been poor.  8. ABLA: Will recheck on Monday.  9.  Hypokalemia, has had diarrhea recently. Will give by mouth KCl recheck bmet  LOS (Days) 3 A FACE TO FACE EVALUATION WAS PERFORMED  Jolonda Gomm E 10/11/2012, 8:13 AM

## 2012-10-11 NOTE — Progress Notes (Signed)
Occupational Therapy Note  Patient Details  Name: Charles [name] MRN: [id number] Date of Birth: 1946-01-22 Today's Date: 10/11/2012  Time: 8:30-9:30am ( .) Pt seen for 1:1 OT session with focus on activity tolerance, BADLs and overall safety with transfers and ambulation. Pt asleep in bed upon arrival and very difficult to rouse. Pt mumbling and difficult to understand throughout session. Once awake, pt agreeable to wash up at sink and dress. Could not find any clothing in pt's room, asked him if anyone could bring him some. He stated his son may be able to bring some clothing. Therapist told him to ask his son (or a friend) to bring him some comfortable clothing to work in, with pt agreeing. Pt sat EOB with S and walked to sink with RW. Pt stood at sink for part of wash up, sitting for most. Max A for back and lower legs/feet. Mod A to donn protective brief and pt donned fresh gown. Pt refused to complete oral care. Pt more talkative once out of bed and sitting up. Pt maneuvered wheelchair out of room and through day room before nursing asked to have him put back to bed at end of session. Pt back in vail bed with call bell and phone within reach, bed zipped up. No pain reported by pt this session.   Apoorva Bugay Hessie Diener 10/11/2012, 12:44 PM

## 2012-10-11 NOTE — Progress Notes (Addendum)
INITIAL NUTRITION ASSESSMENT  DOCUMENTATION CODES Per approved criteria  -Severe malnutrition in the context of chronic illness   INTERVENTION: 1. Please obtain new weight 2. 120 ml Ensure Complete with meds QID 3. MVI daily 4. RD to continue to follow nutrition care plan  NUTRITION DIAGNOSIS: Inadequate oral intake related to poor appetite as evidenced by severe muscle and fat mass wasting.   Goal: Intake to meet >90% of estimated nutrition needs.  Monitor:  weight trends, lab trends, I/O's, PO intake, supplement tolerance  Reason for Assessment: Health History  67 y.o. male  Admitting Dx: Encephalopathy, metabolic  ASSESSMENT: 8-month history of abdominal pain, low back pain with LLE weakness with problems walking and difficulty with oral intake. 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 He was taken to OR emergently for AAA repair with left common iliac to right common femoral bypass, aortic endarterectomy, right femoral endarterectomy profundaplasty, placement abdominal VAC. Abdominal wound closure done on 4/25. Significant diarrhea requiring rectal tube. C-diff negative. Hx of alcoholism.  Pt reports that his intake is improved from baseline. States that he has has never weighed >137 lb in his entire lifetime and has always been thin. Pt followed by RD staff during acute hospitalization and required nutrition support.  Nutrition Focused Physical Exam:  Subcutaneous Fat:  Orbital Region: moderately depleted Upper Arm Region: severely depleted Thoracic and Lumbar Region: severely depleted  Muscle:  Temple Region: severely depleted Clavicle Bone Region: severely depleted Clavicle and Acromion Bone Region: severely depleted Scapular Bone Region: severely depleted Dorsal Hand: severely depleted Patellar Region: severely depleted Anterior Thigh Region: severely depleted Posterior Calf Region: severely  depleted  Edema: n/a  Pt with diarrhea, likely 2/2 abx. Probiotic added.  Height: 5'7" per pt report  Weight: Wt Readings from Last 1 Encounters:  10/05/12 126 lb 15.8 oz (57.6 kg)  Admit wt to hospital on 4/22 - 99 lb  Ideal Body Weight: 148 lb  % Ideal Body Weight: 85%  Wt Readings from Last 10 Encounters:  10/05/12 126 lb 15.8 oz (57.6 kg)  10/05/12 126 lb 15.8 oz (57.6 kg)  10/05/12 126 lb 15.8 oz (57.6 kg)  10/05/12 126 lb 15.8 oz (57.6 kg)  09/16/12 130 lb (58.968 kg)  09/15/12 130 lb (58.968 kg)  09/12/12 130 lb (58.968 kg)    Usual Body Weight: 130-135 lb  % Usual Body Weight: 95%  BMI:  19.9 - weight is WNL  Estimated Nutritional Needs: Kcal: 1500 - 1700 Protein: 85 - 100 grams Fluid: 1.5 - 1.7 liters daily  Skin: abdomen, R groin, and R leg incision  Diet Order: General  EDUCATION NEEDS: -No education needs identified at this time   Intake/Output Summary (Last 24 hours) at 10/11/12 1210 Last data filed at 10/11/12 0647  Gross per 24 hour  Intake    590 ml  Output    270 ml  Net    320 ml    Last BM: 5/10  Labs:   Recent Labs Lab 10/06/12 0411 10/11/12 0605  NA 136 137  K 3.5 2.6*  CL 102 102  CO2 23 26  BUN 20 15  CREATININE 0.67 0.75  CALCIUM 7.6* 7.8*  GLUCOSE 83 150*    CBG (last 3)  No results found for this basename: GLUCAP,  in the last 72 hours  Scheduled Meds: . antiseptic oral rinse  15 mL Mouth Rinse QID  . cefTRIAXone (ROCEPHIN)  IV  2 g Intravenous Q24H  .  chlorhexidine  15 mL Mouth Rinse BID  . enoxaparin (LOVENOX) injection  40 mg Subcutaneous Q24H  . metoprolol tartrate  50 mg Oral BID  . potassium chloride  40 mEq Oral BID  . QUEtiapine  50 mg Oral QHS  . saccharomyces boulardii  250 mg Oral BID    Continuous Infusions:   Past Medical History  Diagnosis Date  . Hypercholesteremia   . Collapsed lung     s/p fall, rib fracture    Past Surgical History  Procedure Laterality Date  . Hernia repair     . Pleural scarification      collapsed lung  . Abdominal aortic aneurysm repair N/A 09/21/2012    Procedure: ANEURYSM ABDOMINAL AORTIC REPAIR;  Surgeon: Sherren Kerns, MD;  Location: [facility] OR;  Service: Vascular;  Laterality: N/A;  . Patch angioplasty  09/21/2012    Procedure: PATCH ANGIOPLASTY;  Surgeon: Sherren Kerns, MD;  Location: [facility] OR;  Service: Vascular;;  Hemashield Patch Angioplasty fo Right Common Femoral Artery  . Embolectomy Right 09/21/2012    Procedure: EMBOLECTOMY;  Surgeon: Sherren Kerns, MD;  Location: [facility] OR;  Service: Vascular;  Laterality: Right;  Embolectomy of right leg.  . Wound debridement  09/24/2012    Procedure: Removal of Abdominal wound VAC, and Abdominal Closure;  Surgeon: Sherren Kerns, MD;  Location: [facility] OR;  Service: Vascular;;  . Femoral-popliteal bypass graft Right 09/30/2012    Procedure: BYPASS GRAFT FEMORAL-POPLITEAL ARTERY;  Surgeon: Fransisco Hertz, MD;  Location: [facility] OR;  Service: Vascular;  Laterality: Right;    Jarold Motto MS, RD, LDN Pager: [phone] After-hours pager: [phone]

## 2012-10-11 NOTE — Telephone Encounter (Addendum)
Message copied by Rosalyn Charters on Mon Oct 11, 2012  9:52 AM ------      Message from: Fabienne Bruns E      Created: Mon Oct 11, 2012  7:41 AM      Regarding: RE: fu appt.       He can see me in one month with bilat ABI and right leg duplex      ----- Message -----         From: Rosalyn Charters         Sent: 10/08/2012   2:48 PM           To: Sherren Kerns, MD      Subject: fu appt.                                                 Dr. Darrick Penna,            Do you want to fu with this patient in the office or at the hospital?            Thank you            Kendal Hymen      ----- Message -----         From: Melene Plan, RN         Sent: 10/08/2012  10:23 AM           To: [facility] Admin Pool                        ----- Message -----         From: Lars Mage, PA-C         Sent: 10/08/2012   9:31 AM           To: Melene Plan, RN            S/P aortobifemoral by pass.  Transfer to rehab in house follow up in 2 weeks with Dr. Darrick Penna.             ------  mailed appt. letter to inform patient of fu appt. on 11-11-12 2PM with dr. Darrick Penna

## 2012-10-11 NOTE — Progress Notes (Signed)
Pt more lucid today but apparently wanes as the day progresses.  Hopefully this will continue to improve with time as his functional and nutrition status improves.  I will arrange post op follow up for him to see me in about a month.  Fabienne Bruns, MD [facility] Office: [phone] Pager: [phone]

## 2012-10-11 NOTE — Progress Notes (Signed)
Physical Therapy Session Note  Patient Details  Name: ALVY ALSOP MRN: [id number] Date of Birth: 11/03/1945  Today's Date: 10/11/2012 Time: 1105-1205 Time Calculation (min): 60 min  Short Term Goals: LTGs    Skilled Therapeutic Interventions/Progress Updates:   Treatment focused on transfers, gait, therapeutic exercise performed with LE to increase strength for functional mobility: calf raises, mini squats; self PROM bil hamstrings through positioning.  Bed>< w/c transfers stand pivot with supervision.  Gait x 87' , x 36' with RW, min assist> close supervision, cues for upright posture.   Pt reported he needed to use BR to void.  Gait in room to/from toilet; toilet transfer with min assist.  Pt returned to nurse's station, safety belt on pt in w/c.  Pt was compliant and able to follow 2 step directions.  Disoriented to time and situation.     Therapy Documentation Precautions:  Precautions Precautions: Fall Restrictions Weight Bearing Restrictions: No    Pain:- none reported  Pain Assessment Pain Assessment: No/denies pain   Locomotion : Ambulation Ambulation/Gait Assistance: 4: Min guard   See FIM for current functional status  Therapy/Group: Co-Treatment  Deaunna Olarte 10/11/2012, 12:16 PM

## 2012-10-11 NOTE — Significant Event (Signed)
CRITICAL VALUE ALERT  Critical value received:  K 2.6  Date of notification:  10/11/12  Time of notification:  0730  Critical value read back:yes  Nurse who received alert:  Laural Roes RN  MD notified (1st page):  Faith Rogue MD   Time of first page:  0735   MD notified (2nd page):  Time of second page:  Responding MD:  Faith Rogue  Time MD responded:  [phone]

## 2012-10-11 NOTE — Progress Notes (Signed)
Patient information reviewed and entered into eRehab system by Tora Duck, RN, CRRN, PPS Coordinator.  Information including medical coding and functional independence measure will be reviewed and updated through discharge.     Per nursing patient was given "Data Collection Information Summary for Patients in Inpatient Rehabilitation Facilities with attached "Privacy Act Statement-Health Care Records" upon admission, left at bedside for family review due to patient's confusion upon admission.

## 2012-10-11 NOTE — Progress Notes (Signed)
Speech Language Pathology Daily Session Note  Patient Details  Name: Charles [name] MRN: [id number] Date of Birth: Nov 18, 1945  Today's Date: 10/11/2012 Time: 1330-1415 Time Calculation (min): 45 min  Short Term Goals: Week 1: SLP Short Term Goal 1 (Week 1): Patient will consume regular textures and thin liquids with no overt s/s of aspiration and Supervision vebral cues for small bites and sips. SLP Short Term Goal 2 (Week 1): Patient will verbalize orientation with Mod assist question cues. SLP Short Term Goal 3 (Week 1): Patient will request help as needed with call bell with Max assist verbal cues. SLP Short Term Goal 4 (Week 1): Patient sustain attention to basic, familiar task for 5 minutes with Mod assist cues for re-direction. SLP Short Term Goal 5 (Week 1): Patient willl increase speech intelligibility at the phrase level with Min assist verbal cues.  Skilled Therapeutic Interventions: Skilled treatment focused on cognitive-linguistic goals. SLP facilitated with Mod reorientation cues for location and time. Pt required Mod verbal/question cues for anticipatory awareness related to safety, including the use of his call bell. Pt required Mod-Max visual/verbal cues from SLP to attend to card game for approximately 15 minutes in total.   FIM:  Comprehension Comprehension Mode: Auditory Comprehension: 2-Understands basic 25 - 49% of the time/requires cueing 51 - 75% of the time Expression Expression Mode: Verbal Expression: 3-Expresses basic 50 - 74% of the time/requires cueing 25 - 50% of the time. Needs to repeat parts of sentences. Social Interaction Social Interaction: 4-Interacts appropriately 75 - 89% of the time - Needs redirection for appropriate language or to initiate interaction. Problem Solving Problem Solving: 2-Solves basic 25 - 49% of the time - needs direction more than half the time to initiate, plan or complete simple activities Memory Memory: 2-Recognizes or  recalls 25 - 49% of the time/requires cueing 51 - 75% of the time FIM - Eating Eating Activity: 5: Set-up assist for open containers  Pain Pain Assessment Pain Assessment: No/denies pain  Therapy/Group: Individual Therapy  Maxcine Ham 10/11/2012, 4:46 PM  Maxcine Ham, M.A. CCC-SLP

## 2012-10-12 ENCOUNTER — Inpatient Hospital Stay (HOSPITAL_COMMUNITY): Payer: Medicare Other

## 2012-10-12 ENCOUNTER — Inpatient Hospital Stay (HOSPITAL_COMMUNITY): Payer: Medicare Other | Admitting: Speech Pathology

## 2012-10-12 ENCOUNTER — Inpatient Hospital Stay (HOSPITAL_COMMUNITY): Payer: Medicare Other | Admitting: Occupational Therapy

## 2012-10-12 LAB — BASIC METABOLIC PANEL
CO2: 23 mEq/L (ref 19–32)
Calcium: 8.2 mg/dL — ABNORMAL LOW (ref 8.4–10.5)
Creatinine, Ser: 0.65 mg/dL (ref 0.50–1.35)
GFR calc Af Amer: >90 mL/min (ref >90)
Sodium: 136 mEq/L (ref 135–145)

## 2012-10-12 LAB — URINE CULTURE: Colony Count: NO GROWTH

## 2012-10-12 MED ORDER — POTASSIUM CHLORIDE CRYS ER 20 MEQ PO TBCR
20.0000 meq | EXTENDED_RELEASE_TABLET | Freq: Every day | ORAL | Status: AC
  Administered 2012-10-13 – 2012-10-14 (×2): 20 meq via ORAL
  Filled 2012-10-12 (×2): qty 1

## 2012-10-12 NOTE — Progress Notes (Signed)
Speech Language Pathology Daily Session Note  Patient Details  Name: Charles [name] MRN: [id number] Date of Birth: [birth year]  Today's Date: 10/12/2012 Time: 1420-1505 Time Calculation (min): 45 min  Short Term Goals: Week 1: SLP Short Term Goal 1 (Week 1): Patient will consume regular textures and thin liquids with no overt s/s of aspiration and Supervision vebral cues for small bites and sips. SLP Short Term Goal 2 (Week 1): Patient will verbalize orientation with Mod assist question cues. SLP Short Term Goal 3 (Week 1): Patient will request help as needed with call bell with Max assist verbal cues. SLP Short Term Goal 4 (Week 1): Patient sustain attention to basic, familiar task for 5 minutes with Mod assist cues for re-direction. SLP Short Term Goal 5 (Week 1): Patient willl increase speech intelligibility at the phrase level with Min assist verbal cues.  Skilled Therapeutic Interventions: Skilled treatment focused on cognitive-linguistic goals. SLP facilitated with Mod reorientation cues for location and time. Patient also required Min verbal cues to problem solve basic money management task.  Patient requested orange sherbet and requested that SLP let him do it all himself, which he was able to with increased wait time.     FIM:  Comprehension Comprehension Mode: Auditory Comprehension: 5-Follows basic conversation/direction: With extra time/assistive device Expression Expression Mode: Verbal Expression: 3-Expresses basic 50 - 74% of the time/requires cueing 25 - 50% of the time. Needs to repeat parts of sentences. Social Interaction Social Interaction: 5-Interacts appropriately 90% of the time - Needs monitoring or encouragement for participation or interaction. Problem Solving Problem Solving: 4-Solves basic 75 - 89% of the time/requires cueing 10 - 24% of the time Memory Memory: 3-Recognizes or recalls 50 - 74% of the time/requires cueing 25 - 49% of the time FIM -  Eating Eating Activity: 5: Set-up assist for open containers  Pain Pain Assessment Pain Assessment: No/denies pain  Therapy/Group: Individual Therapy  Charlane Ferretti., CCC-SLP [phone]  Diva Lemberger 10/12/2012, 4:13 PM

## 2012-10-12 NOTE — Progress Notes (Signed)
Physical Therapy Note  Patient Details  Name: Charles [name] MRN: [id number] Date of Birth: [birth year] Today's Date: 10/12/2012  11:00 - 12:00 60 minutes Individual session Patient denies pain  Treatment session focused on activity tolerance, ambulation, balance, and functional mobility. Patient ambulated 90 feet with rolling walker and supervision. Patient ambulated around obstacles and stepped over thresholds with minimal contact assist for balance. Patient ambulated up and down 10 steps with bilateral rails and supervision. Patient ambulated 70 feet without assistive device and minimal steady assist. Patient performed sit to stand from sofa and bed with supervision. Patient performed bed mobility on double bed with supervision. Patient worked on endurance on Nustep for 2 minutes x 2 on work load 3. Patient reported exertion at 77. Patient stood at table with UE support to play checkers for 10 minutes. Patient ambulated 150 feet with RW and supervision. Patient left at nurses station in wheelchair with quick release belt in place.   Arelia Longest M 10/12/2012, 12:08 PM

## 2012-10-12 NOTE — Progress Notes (Signed)
Physical Therapy Session Note  Patient Details  Name: Charles [name] MRN: [id number] Date of Birth: August 19, 1945  Today's Date: 10/12/2012 Time: 1330-1400 Time Calculation (min): 30 min  Short Term Goals: Week 1:  PT Short Term Goal 1 (Week 1): Same as LTGs  Skilled Therapeutic Interventions/Progress Updates:  PM session focused on retesting of BERG balance test. Patient scored 46/56.  Therapy Documentation Precautions:  Precautions Precautions: Fall Restrictions Weight Bearing Restrictions: No Pain: Pain Assessment Pain Assessment: No/denies pain Locomotion : Ambulation Ambulation/Gait Assistance: 5: Supervision  Balance: Berg Balance Test Sit to Stand: Able to stand  independently using hands Standing Unsupported: Able to stand safely 2 minutes Sitting with Back Unsupported but Feet Supported on Floor or Stool: Able to sit safely and securely 2 minutes Stand to Sit: Controls descent by using hands Transfers: Able to transfer safely, definite need of hands Standing Unsupported with Eyes Closed: Able to stand 10 seconds safely Standing Ubsupported with Feet Together: Able to place feet together independently and stand 1 minute safely From Standing, Reach Forward with Outstretched Arm: Can reach forward >5 cm safely (2") From Standing Position, Pick up Object from Floor: Able to pick up shoe safely and easily From Standing Position, Turn to Look Behind Over each Shoulder: Looks behind from both sides and weight shifts well Turn 360 Degrees: Able to turn 360 degrees safely but slowly Standing Unsupported, Alternately Place Feet on Step/Stool: Able to stand independently and complete 8 steps >20 seconds Standing Unsupported, One Foot in Front: Able to plae foot ahead of the other independently and hold 30 seconds Standing on One Leg: Able to lift leg independently and hold 5-10 seconds Total Score: 46  See FIM for current functional status  Therapy/Group: Individual  Therapy  Alma Friendly 10/12/2012, 3:42 PM

## 2012-10-12 NOTE — Consult Note (Signed)
NEUROBEHAVIORAL STATUS EXAM - [facility]   MEDICAL NECESSITY:  Rollie Hynek was seen on the [facility] Unit for a neurobehavioral status exam owing to the patient's diagnosis of severe deconditioning following treatment of ruptured AAA.   According to medical records, Mr. Fesperman was admitted to the rehab unit owing to functional deficits secondary to delirium and severe deconditioning following treatment of ruptured abdominal aortic aneurysm.  Upon entering the patient's room, he was observed laying in a veiled bad. He exhibited little movement and maintained no eye contact. He seemed mildly disoriented though some thought content was appropriate. Upon questioning, he initially thought that he had been involved in an accident of some sort and that was what brought him into the hospital. He later realized this might not be true after one of the staff members told him what was in his medical record.   In regard to cognition, Mr. Gallina acknowledged suffering from mild memory loss and trouble paying attention and concentrating. However, it was difficult for him to express these concerns in any great detail. Generally, he seems to feel good about admission thus far and thinks that  physical therapy has been helpful.   PROCEDURES ADMINISTERED: [2 units [id number] on 10/11/12] Diagnostic clinical interview  Review of available records Mental Status Exam-2 (brief version) Beck Depression Inventory - Fast Screen  MENTAL STATUS: Mr. Cerullo mental status exam score was below the cutoff used to indicate cognitive impairment or dementia. While he was able to immediately register 3 words into memory, he could not recall any of them after a very short delay. Although he knew that the year is 2014, he was otherwise completely disoriented to time. While he knew what state, county and city he was in, he did not know the name of [facility] or  what floor he was staying on.  Emotional & Behavioral Evaluation: Mr. Hollenbaugh was appropriately dressed for the situation. He was generally friendly and rapport was adequately established. HIs speech was fluent but his thought content was not always appropriate. His voice also seemed tremulous at times, but he was able to generally express ideas effectively. He seemed to understand test directions readily. His affect was flat. Attention and motivation were good. Optimal test taking conditions were maintained.  From an emotional standpoint, on a brief self-report inventory, Mr. Micalizzi denied experiencing any significant symptoms of depression. Suicidal/homicidal ideation, plan or intent was denied. No manic or hypomanic episodes were reported. The patient denied ever experiencing any auditory/visual hallucinations. No major behavioral or personality changes were endorsed.    Overall, Mr. Tillery seems to be suffering from a significant degree of cognitive disruption (and likely delirium) owing to his present medical situation. Fortunately, there do not seem to be any major mood factors or adjustment issues present that are adversely impact therapy. However, it is possible that his delirium could shift and he could become agitated or confused again.  In light of these findings, the following recommendations are provided.    RECOMMENDATIONS  Recommendations for treatment team:    Mr. Joles mentioned that he enjoyed having visitors throughout the day. As such, providing ego support during his admission may be beneficial.   If the patient's delirium abates and he becomes more oriented, completing a more thorough neuropsychological screen may be beneficial.   REFERRING DIAGNOSIS: Metabolic encephalopathy  FINAL DIAGNOSIS:  Metabolic encephalopathy   Debbe Mounts, Psy.D.  Clinical Neuropsychologist

## 2012-10-12 NOTE — Progress Notes (Signed)
Recreational Therapy Session Note  Patient Details  Name: CHON BUHL MRN: [id number] Date of Birth: March 10, 1946 Today's Date: 10/12/2012  Pt participated in animal assisted activity/therapy seated w/c level with supervision.  Lafaye Mcelmurry 10/12/2012, 3:11 PM

## 2012-10-12 NOTE — Plan of Care (Signed)
Problem: RH SAFETY Goal: RH STG ADHERE TO SAFETY PRECAUTIONS W/ASSISTANCE/DEVICE STG Adhere to Safety Precautions With min Assistance/Device.  Outcome: Not Progressing Patient requires Bed Enclosure Apparatus for safety.

## 2012-10-12 NOTE — Progress Notes (Signed)
Occupational Therapy Session Note  Patient Details  Name: Charles [name] MRN: [id number] Date of Birth: Sep 15, 1945  Today's Date: 10/12/2012 Time: 1000-1100 Time Calculation (min): 60 min  Short Term Goals: Week 1:  OT Short Term Goal 1 (Week 1): Patient will increase grooming to supervision. OT Short Term Goal 2 (Week 1): Patient will increase UB dressing to Min Assist. OT Short Term Goal 3 (Week 1): Patient will increase LB dressing to Max Assist. OT Short Term Goal 4 (Week 1): Patient will increase toileting to Mod Assist. OT Short Term Goal 5 (Week 1): Patient will increase toilet transfer to Min Assist.  Skilled Therapeutic Interventions/Progress Updates:      Pt seen for BADL retraining of toileting, bathing, and dressing with a focus on balance and activity tolerance. Pt performed bed mobility, bed to chair transfer and all of self care with supervision except for assist to don socks.  He demonstrated good activity tolerance through out session.  He then worked on [employer] for 10 min at resistance 5.  Pt's PT had arrived for his next session.  Therapy Documentation Precautions:  Precautions Precautions: Fall Restrictions Weight Bearing Restrictions: No  Pain: Pain Assessment Pain Assessment: No/denies pain ADL:  See FIM for current functional status  Therapy/Group: Individual Therapy  Rashawnda Gaba 10/12/2012, 12:10 PM

## 2012-10-12 NOTE — Progress Notes (Signed)
Patient ID: Charles [name], male   DOB: 03/09/1946, 67 y.o.   MRN: [id number] Subjective/Complaints: 67 year old patient admitted to the rehabilitation service  10/08/12 with severe deconditioning following treatment of a ruptured AAA approximately 3 weeks ago. Hospital course complicated by Salmonella bacteremia and he is completing IV Rocephin. He has a history of LV dysfunction, history of alcohol abuse he continues to have issues with confusion and intermittent paranoia. He remains quite weak but his delirium has improved.  Enclosed bed Patient without complaints today no pains Review of Systems  Unable to perform ROS: mental acuity   Objective: Vital Signs: Blood pressure 126/81, pulse 88, temperature 98.3 F (36.8 C), temperature source Oral, resp. rate 17, height 5\' 7"  (1.702 m), SpO2 97.00%. No results found. Results for orders placed during the hospital encounter of 10/08/12 (from the past 72 hour(s))  URINALYSIS, ROUTINE W REFLEX MICROSCOPIC     Status: Abnormal   Collection Time    10/11/12 12:20 AM      Result Value Range   Color, Urine YELLOW  YELLOW   APPearance CLEAR  CLEAR   Specific Gravity, Urine 1.025  1.005 - 1.030   pH 5.5  5.0 - 8.0   Glucose, UA NEGATIVE  NEGATIVE mg/dL   Hgb urine dipstick NEGATIVE  NEGATIVE   Bilirubin Urine NEGATIVE  NEGATIVE   Ketones, ur 15 (*) NEGATIVE mg/dL   Protein, ur NEGATIVE  NEGATIVE mg/dL   Urobilinogen, UA 0.2  0.0 - 1.0 mg/dL   Nitrite NEGATIVE  NEGATIVE   Leukocytes, UA NEGATIVE  NEGATIVE   Comment: MICROSCOPIC NOT DONE ON URINES WITH NEGATIVE PROTEIN, BLOOD, LEUKOCYTES, NITRITE, OR GLUCOSE <1000 mg/dL.  CBC WITH DIFFERENTIAL     Status: Abnormal   Collection Time    10/11/12  6:05 AM      Result Value Range   WBC 6.5  4.0 - 10.5 K/uL   RBC 3.17 (*) 4.22 - 5.81 MIL/uL   Hemoglobin 9.6 (*) 13.0 - 17.0 g/dL   HCT 40.9 (*) 81.1 - 91.4 %   MCV 88.6  78.0 - 100.0 fL   MCH 30.3  26.0 - 34.0 pg   MCHC 34.2  30.0 - 36.0 g/dL   RDW  78.2 (*) 95.6 - 15.5 %   Platelets 219  150 - 400 K/uL   Neutrophils Relative 59  43 - 77 %   Neutro Abs 3.8  1.7 - 7.7 K/uL   Lymphocytes Relative 32  12 - 46 %   Lymphs Abs 2.1  0.7 - 4.0 K/uL   Monocytes Relative 8  3 - 12 %   Monocytes Absolute 0.5  0.1 - 1.0 K/uL   Eosinophils Relative 1  0 - 5 %   Eosinophils Absolute 0.1  0.0 - 0.7 K/uL   Basophils Relative 1  0 - 1 %   Basophils Absolute 0.0  0.0 - 0.1 K/uL  COMPREHENSIVE METABOLIC PANEL     Status: Abnormal   Collection Time    10/11/12  6:05 AM      Result Value Range   Sodium 137  135 - 145 mEq/L   Potassium 2.6 (*) 3.5 - 5.1 mEq/L   Comment: CRITICAL RESULT CALLED TO, READ BACK BY AND VERIFIED WITH:     VAUGHN G RN 10/11/12 0730 COSTELLO B   Chloride 102  96 - 112 mEq/L   CO2 26  19 - 32 mEq/L   Glucose, Bld 150 (*) 70 - 99 mg/dL   BUN  15  6 - 23 mg/dL   Creatinine, Ser 1.61  0.50 - 1.35 mg/dL   Calcium 7.8 (*) 8.4 - 10.5 mg/dL   Total Protein 5.8 (*) 6.0 - 8.3 g/dL   Albumin 1.9 (*) 3.5 - 5.2 g/dL   AST 70 (*) 0 - 37 U/L   ALT 81 (*) 0 - 53 U/L   Alkaline Phosphatase 68  39 - 117 U/L   Total Bilirubin 0.4  0.3 - 1.2 mg/dL   GFR calc non Af Amer >90  >90 mL/min   GFR calc Af Amer >90  >90 mL/min   Comment:            The eGFR has been calculated     using the CKD EPI equation.     This calculation has not been     validated in all clinical     situations.     eGFR's persistently     <90 mL/min signify     possible Chronic Kidney Disease.     HEENT: poor dentition Cardio: RRR and no murmurs Resp: CTA B/L and breathing unlabored GI: BS positive and nondistended Extremity:  No Edema Skin:   Wound C/D/I Neuro: Alert/Oriented, Confused, Cranial Nerve II-XII normal and Abnormal Motor 4/5 bilateral deltoid, biceps, triceps, grip, hip flexor, knee extensors, ankle dorsiflexor plantar flexor Musc/Skel:  Other atrophy of both upper and lower extremity  Gen. no acute distress   Assessment/Plan: 1. Functional  deficits secondary to Deconditioning which require 3+ hours per day of interdisciplinary therapy in a comprehensive inpatient rehab setting. Physiatrist is providing close team supervision and 24 hour management of active medical problems listed below. Physiatrist and rehab team continue to assess barriers to discharge/monitor patient progress toward functional and medical goals. FIM: FIM - Bathing Bathing Steps Patient Completed: Chest;Right Arm;Left Arm;Abdomen;Front perineal area;Buttocks;Right upper leg;Left upper leg Bathing: 4: Min-Patient completes 8-9 69f 10 parts or 75+ percent  FIM - Upper Body Dressing/Undressing Upper body dressing/undressing: 0: Wears gown/pajamas-no public clothing (pt does not have clothing here) FIM - Lower Body Dressing/Undressing Lower body dressing/undressing steps patient completed: Don/Doff right sock;Don/Doff left sock Lower body dressing/undressing: 0: Wears gown/pajamas-no public clothing (pt does not have clothing here)  FIM - Toileting Toileting steps completed by patient: Adjust clothing prior to toileting Toileting Assistive Devices: Grab bar or rail for support Toileting: 2: Max-Patient completed 1 of 3 steps  FIM - Diplomatic Services operational officer Devices: Grab bars;Walker Toilet Transfers: 0-Activity did not occur  FIM - Banker Devices: Bed rails;Arm rests Bed/Chair Transfer: 4: Chair or W/C > Bed: Min A (steadying Pt. > 75%);4: Bed > Chair or W/C: Min A (steadying Pt. > 75%);5: Sit > Supine: Supervision (verbal cues/safety issues);5: Supine > Sit: Supervision (verbal cues/safety issues) (2 helpers with nsg for safety)  FIM - Locomotion: Wheelchair Locomotion: Wheelchair: 0: Activity did not occur FIM - Locomotion: Ambulation Locomotion: Ambulation Assistive Devices: Designer, industrial/product Ambulation/Gait Assistance: 4: Min guard Locomotion: Ambulation: 2: Travels 50 - 149 ft with minimal  assistance (Pt.>75%) (87')  Comprehension Comprehension Mode: Auditory Comprehension: 3-Understands basic 50 - 74% of the time/requires cueing 25 - 50%  of the time  Expression Expression Mode: Verbal Expression: 3-Expresses basic 50 - 74% of the time/requires cueing 25 - 50% of the time. Needs to repeat parts of sentences.  Social Interaction Social Interaction: 4-Interacts appropriately 75 - 89% of the time - Needs redirection for appropriate language or to initiate interaction.  Problem Solving Problem Solving: 3-Solves basic 50 - 74% of the time/requires cueing 25 - 49% of the time  Memory Memory: 3-Recognizes or recalls 50 - 74% of the time/requires cueing 25 - 49% of the time  Medical Problem List and Plan:  1. DVT Prophylaxis/Anticoagulation: Pharmaceutical: Lovenox  2. Pain Management: Will monitor for symptoms. Denies any problems but used percocet at home per records.  3. Metabolic encephalopathy/Mood: Poor insight/ awareness due to ongoing confusion.  4. Neuropsych: This patient is not capable of making decisions on his own behalf.  5. Salmonella bacteremia: IV antibiotic D# 18/42.  6. Agitation: continue Seroquel at bedtime with prn ativan. May need to titrate for better control.  7. Diarrhea: Likely due to antibiotics. Will add probiotic. Po intake has been poor.  8. ABLA: Will recheck on Monday.  9.  Hypokalemia, has had diarrhea recently. Will give by mouth KCl recheck bmet  LOS (Days) 4 A FACE TO FACE EVALUATION WAS PERFORMED  [name],[name] 10/12/2012, 8:37 AM

## 2012-10-13 ENCOUNTER — Inpatient Hospital Stay (HOSPITAL_COMMUNITY): Payer: Medicare Other

## 2012-10-13 ENCOUNTER — Inpatient Hospital Stay (HOSPITAL_COMMUNITY): Payer: Medicare Other | Admitting: Occupational Therapy

## 2012-10-13 ENCOUNTER — Inpatient Hospital Stay (HOSPITAL_COMMUNITY): Payer: Medicare Other | Admitting: Speech Pathology

## 2012-10-13 NOTE — Progress Notes (Signed)
[facility] Individual Statement of Services  Patient Name:  Charles [name]  Date:  10/13/2012  Welcome to the [facility].  Our goal is to provide you with an individualized program based on your diagnosis and situation, designed to meet your specific needs.  With this comprehensive rehabilitation program, you will be expected to participate in at least 3 hours of rehabilitation therapies Monday-Friday, with modified therapy programming on the weekends.  Your rehabilitation program will include the following services:  Physical Therapy (PT), Occupational Therapy (OT), Speech Therapy (ST), 24 hour per day rehabilitation nursing, Therapeutic Recreaction (TR), Neuropsychology, Case Management (Social Worker), Rehabilitation Medicine, Nutrition Services and Pharmacy Services  Weekly team conferences will be held on Wednesdays to discuss your progress.  Your Social Worker will talk with you frequently to get your input and to update you on team discussions.  Team conferences with you and your family in attendance may also be held.  Expected length of stay: 2 weeks Overall anticipated outcome: supervision to minimal assist  Depending on your progress and recovery, your program may change. Your Social Worker will coordinate services and will keep you informed of any changes. Your Child psychotherapist names and contact numbers are listed  below.  The following services may also be recommended but are not provided by the [facility]:   Driving Evaluations  Home Health Rehabiltiation Services  [facility]  Vocational Rehabilitation   Arrangements will be made to provide these services after discharge if needed.  Arrangements include referral to agencies that provide these services.  Your insurance has been verified to be:  Medicare and Medicaid Your primary doctor is:  Assunta Found  Pertinent information will be  shared with your doctor and your insurance company.  Social Worker:  Dossie Der, [place] [phone]  Information discussed with and copy given to patient by: Amada Jupiter, 10/13/2012, 3:50 PM

## 2012-10-13 NOTE — Progress Notes (Signed)
Occupational Therapy Session Note  Patient Details  Name: Charles [name] MRN: [id number] Date of Birth: 1945/10/30  Today's Date: 10/13/2012 Time: 1045-1130 Time Calculation (min): 45 min  Short Term Goals: Week 1:  OT Short Term Goal 1 (Week 1): Patient will increase grooming to supervision. OT Short Term Goal 2 (Week 1): Patient will increase UB dressing to Min Assist. OT Short Term Goal 3 (Week 1): Patient will increase LB dressing to Max Assist. OT Short Term Goal 4 (Week 1): Patient will increase toileting to Mod Assist. OT Short Term Goal 5 (Week 1): Patient will increase toilet transfer to Min Assist.  Skilled Therapeutic Interventions/Progress Updates:    Pt seen this session for ADL tub transfers of stepping in and out of tub using grab bar. He did need steady assist as it was difficult for him to actively flex hips greater than 90 degrees. Otherwise he was close supervision for ambulation from room to ADL apartment back to room with 2 rest breaks.  He worked on alternating step ups on bench without UE support and close supervision.  UE strengthening exercises with weighted dowel bar.  Pt's brother in law present and expressed he was surprised and pleased with how well pt was doing. At end of session, pt in recliner with quick release belt and call light and phone in reach.  Therapy Documentation Precautions:  Precautions Precautions: Fall Restrictions Weight Bearing Restrictions: No General Amount of Missed OT Time (min): 30 Minutes  Pain: No c/o pain Pain Assessment Pain Assessment: No/denies pain ADL: See FIM for current functional status  Therapy/Group: Individual Therapy  SAGUIER,JULIA 10/13/2012, 11:50 AM
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
## 2012-10-13 NOTE — Progress Notes (Signed)
Occupational Therapy Session Note  Patient Details  Name: Charles [name] MRN: [id number] Date of Birth: 09-Oct-1945  Today's Date: 10/13/2012 Time: 0920-0950 Time Calculation (min): 30 min missed 30 minutes due to "Tired, been up since 7:30", "Cold and go back to bed"  Short Term Goals: Week 1:  OT Short Term Goal 1 (Week 1): Patient will increase grooming to supervision. OT Short Term Goal 2 (Week 1): Patient will increase UB dressing to Min Assist. OT Short Term Goal 3 (Week 1): Patient will increase LB dressing to Max Assist. OT Short Term Goal 4 (Week 1): Patient will increase toileting to Mod Assist. OT Short Term Goal 5 (Week 1): Patient will increase toilet transfer to Min Assist.  Skilled Therapeutic Interventions/Progress Updates:  Patient sitting in w/c at nursing station upon arrival.  Once patient was brought back to his room for scheduled bathing & dressing session, he reported that he had already done this and declined to do it again stating that he was cold.  Engaged in ambulating in his hospital room to open each drawer and closet in hopes to find a long sleeve shirt or sweater.  Asked patient if he wanted to call someone to bring him something warm to wear, he stated that he would wait til they came to visit.  Patient requested back to bed yet agreed to stand at the sink first for oral care.  Patient declined need to toilet and wanted back to bed.  Patient guided this clinician on steps needed to zip up the bed!  Call bell within reach.  Therapy Documentation Precautions:  Precautions Precautions: Fall Restrictions Weight Bearing Restrictions: No Pain: Denies pain  Therapy/Group: Individual Therapy  [name] 10/13/2012, 10:16 AM

## 2012-10-13 NOTE — Progress Notes (Addendum)
Physical Therapy Session Note  Patient Details  Name: MADDYX WIECK MRN: [id number] Date of Birth: 1945-08-04  Today's Date: 10/13/2012 Time: 1350-1440 Time Calculation (min): 50 min  Short Term Goals: Week 1:  PT Short Term Goal 1 (Week 1): Same as LTGs  Skilled Therapeutic Interventions/Progress Updates:  Pt asleep in enclosure bed at start of session.  Pt awoke slowly, but was compliant.  A and O x 4.  Tremors x 4 extremeties as well as chin.He remembered therapist's name at end of 45 min session.  No pain reported.  Treatment focused on gait in home and controlled settings, therapeutic exercise performed with bil LEs to increase strength for functional mobility, standing dynamic balance, transfers.  Pt stated he needed to use toilet after he had sat up in bed.  Gait with min assist without RW in room.  Toilet transfer, standing with supervision.    Gait in controlled setting on level tile x 200' with min HHA, and x 130' with RW with close supervision, cues for upright posture and forward gaze. Up/down 5 steps x 2 with 2 rails, supervision, step through pattern.  Fall Prevention 1 exs without weights: calf raises 2 x 10, mini squats 1 x 10.  Floor transfer x 2 with supervision after 1 demo.    Dynamic standing balance with close supervision for retrieving and placing items overhead, requiring trunk rotation, repetitive head rotation.         Therapy Documentation Precautions:  Precautions Precautions: Fall Restrictions Weight Bearing Restrictions: No   Locomotion : Ambulation Ambulation/Gait Assistance: 5: Supervision     See FIM for current functional status  Therapy/Group: Individual Therapy  Zarie Kosiba 10/13/2012, 3:16 PM

## 2012-10-13 NOTE — Progress Notes (Signed)
Speech Language Pathology Daily Session Note  Patient Details  Name: Charles [name] MRN: [id number] Date of Birth: 08/14/1945  Today's Date: 10/13/2012 Time: 4098-1191 Time Calculation (min): 40 min  Short Term Goals: Week 1: SLP Short Term Goal 1 (Week 1): Patient will consume regular textures and thin liquids with no overt s/s of aspiration and Supervision vebral cues for small bites and sips. SLP Short Term Goal 2 (Week 1): Patient will verbalize orientation with Mod assist question cues. SLP Short Term Goal 3 (Week 1): Patient will request help as needed with call bell with Max assist verbal cues. SLP Short Term Goal 4 (Week 1): Patient sustain attention to basic, familiar task for 5 minutes with Mod assist cues for re-direction. SLP Short Term Goal 5 (Week 1): Patient willl increase speech intelligibility at the phrase level with Min assist verbal cues.  Skilled Therapeutic Interventions: Skilled treatment session focused on addressing dysphagia and cognitive goals.  Patient able to safely consume regular textures and thin liquids with increased wait time for mastication and clearing oral residue.  SLP facilitated session with Supervision level verbal cues to set-up tray and locate all items as well as solve basic problems, once eating no other cues required.  SLP also facilitated session with max assist cues for orientation and awareness of deficits.     FIM:  Comprehension Comprehension Mode: Auditory Comprehension: 5-Follows basic conversation/direction: With extra time/assistive device Expression Expression Mode: Verbal Expression: 3-Expresses basic 50 - 74% of the time/requires cueing 25 - 50% of the time. Needs to repeat parts of sentences. Social Interaction Social Interaction: 5-Interacts appropriately 90% of the time - Needs monitoring or encouragement for participation or interaction. Problem Solving Problem Solving: 4-Solves basic 75 - 89% of the time/requires cueing 10 -  24% of the time Memory Memory: 2-Recognizes or recalls 25 - 49% of the time/requires cueing 51 - 75% of the time FIM - Eating Eating Activity: 5: Set-up assist for open containers  Pain Pain Assessment Pain Assessment: No/denies pain  Therapy/Group: Individual Therapy  Charlane Ferretti., CCC-SLP [phone]  Glendora Clouatre 10/13/2012, 10:46 AM

## 2012-10-13 NOTE — Progress Notes (Signed)
Patient ID: Charles [name], male   DOB: Oct 17, 1945, 67 y.o.   MRN: [id number] Subjective/Complaints: 67 year old patient admitted to the rehabilitation service  10/08/12 with severe deconditioning following treatment of a ruptured AAA approximately 3 weeks ago. Hospital course complicated by Salmonella bacteremia and he is completing IV Rocephin. He has a history of LV dysfunction, history of alcohol abuse he continues to have issues with confusion and intermittent paranoia. He remains quite weak but his delirium has improved.  Enclosed bed  Will discuss with team whether pt still needs enclosure bed Review of Systems  Unable to perform ROS: mental acuity   Objective: Vital Signs: Blood pressure 129/80, pulse 77, temperature 98.2 F (36.8 C), temperature source Oral, resp. rate 18, height 5\' 7"  (1.702 m), SpO2 98.00%. No results found. Results for orders placed during the hospital encounter of 10/08/12 (from the past 72 hour(s))  URINALYSIS, ROUTINE W REFLEX MICROSCOPIC     Status: Abnormal   Collection Time    10/11/12 12:20 AM      Result Value Range   Color, Urine YELLOW  YELLOW   APPearance CLEAR  CLEAR   Specific Gravity, Urine 1.025  1.005 - 1.030   pH 5.5  5.0 - 8.0   Glucose, UA NEGATIVE  NEGATIVE mg/dL   Hgb urine dipstick NEGATIVE  NEGATIVE   Bilirubin Urine NEGATIVE  NEGATIVE   Ketones, ur 15 (*) NEGATIVE mg/dL   Protein, ur NEGATIVE  NEGATIVE mg/dL   Urobilinogen, UA 0.2  0.0 - 1.0 mg/dL   Nitrite NEGATIVE  NEGATIVE   Leukocytes, UA NEGATIVE  NEGATIVE   Comment: MICROSCOPIC NOT DONE ON URINES WITH NEGATIVE PROTEIN, BLOOD, LEUKOCYTES, NITRITE, OR GLUCOSE <1000 mg/dL.  URINE CULTURE     Status: None   Collection Time    10/11/12 12:20 AM      Result Value Range   Specimen Description URINE, CLEAN CATCH     Special Requests NONE     Culture  Setup Time 10/11/2012 11:15     Colony Count NO GROWTH     Culture NO GROWTH     Report Status 10/12/2012 FINAL    CBC WITH  DIFFERENTIAL     Status: Abnormal   Collection Time    10/11/12  6:05 AM      Result Value Range   WBC 6.5  4.0 - 10.5 K/uL   RBC 3.17 (*) 4.22 - 5.81 MIL/uL   Hemoglobin 9.6 (*) 13.0 - 17.0 g/dL   HCT 40.9 (*) 81.1 - 91.4 %   MCV 88.6  78.0 - 100.0 fL   MCH 30.3  26.0 - 34.0 pg   MCHC 34.2  30.0 - 36.0 g/dL   RDW 78.2 (*) 95.6 - 21.3 %   Platelets 219  150 - 400 K/uL   Neutrophils Relative % 59  43 - 77 %   Neutro Abs 3.8  1.7 - 7.7 K/uL   Lymphocytes Relative 32  12 - 46 %   Lymphs Abs 2.1  0.7 - 4.0 K/uL   Monocytes Relative 8  3 - 12 %   Monocytes Absolute 0.5  0.1 - 1.0 K/uL   Eosinophils Relative 1  0 - 5 %   Eosinophils Absolute 0.1  0.0 - 0.7 K/uL   Basophils Relative 1  0 - 1 %   Basophils Absolute 0.0  0.0 - 0.1 K/uL  COMPREHENSIVE METABOLIC PANEL     Status: Abnormal   Collection Time    10/11/12  6:05  AM      Result Value Range   Sodium 137  135 - 145 mEq/L   Potassium 2.6 (*) 3.5 - 5.1 mEq/L   Comment: CRITICAL RESULT CALLED TO, READ BACK BY AND VERIFIED WITH:     VAUGHN G RN 10/11/12 0730 COSTELLO B   Chloride 102  96 - 112 mEq/L   CO2 26  19 - 32 mEq/L   Glucose, Bld 150 (*) 70 - 99 mg/dL   BUN 15  6 - 23 mg/dL   Creatinine, Ser 1.61  0.50 - 1.35 mg/dL   Calcium 7.8 (*) 8.4 - 10.5 mg/dL   Total Protein 5.8 (*) 6.0 - 8.3 g/dL   Albumin 1.9 (*) 3.5 - 5.2 g/dL   AST 70 (*) 0 - 37 U/L   ALT 81 (*) 0 - 53 U/L   Alkaline Phosphatase 68  39 - 117 U/L   Total Bilirubin 0.4  0.3 - 1.2 mg/dL   GFR calc non Af Amer >90  >90 mL/min   GFR calc Af Amer >90  >90 mL/min   Comment:            The eGFR has been calculated     using the CKD EPI equation.     This calculation has not been     validated in all clinical     situations.     eGFR's persistently     <90 mL/min signify     possible Chronic Kidney Disease.  BASIC METABOLIC PANEL     Status: Abnormal   Collection Time    10/12/12  8:33 AM      Result Value Range   Sodium 136  135 - 145 mEq/L   Potassium  4.9  3.5 - 5.1 mEq/L   Comment: HEMOLYSIS AT THIS LEVEL MAY AFFECT RESULT   Chloride 105  96 - 112 mEq/L   CO2 23  19 - 32 mEq/L   Glucose, Bld 118 (*) 70 - 99 mg/dL   BUN 14  6 - 23 mg/dL   Creatinine, Ser 0.96  0.50 - 1.35 mg/dL   Calcium 8.2 (*) 8.4 - 10.5 mg/dL   GFR calc non Af Amer >90  >90 mL/min   GFR calc Af Amer >90  >90 mL/min   Comment:            The eGFR has been calculated     using the CKD EPI equation.     This calculation has not been     validated in all clinical     situations.     eGFR's persistently     <90 mL/min signify     possible Chronic Kidney Disease.     HEENT: poor dentition Cardio: RRR and no murmurs Resp: CTA B/L and breathing unlabored GI: BS positive and nondistended Extremity:  No Edema Skin:   Wound C/D/I Neuro: Alert/Oriented, Confused, Cranial Nerve II-XII normal and Abnormal Motor 4/5 bilateral deltoid, biceps, triceps, grip, hip flexor, knee extensors, ankle dorsiflexor plantar flexor Musc/Skel:  Other atrophy of both upper and lower extremity  Gen. no acute distress   Assessment/Plan: 1. Functional deficits secondary to Deconditioning which require 3+ hours per day of interdisciplinary therapy in a comprehensive inpatient rehab setting. Physiatrist is providing close team supervision and 24 hour management of active medical problems listed below. Physiatrist and rehab team continue to assess barriers to discharge/monitor patient progress toward functional and medical goals. FIM: FIM - Bathing Bathing Steps Patient Completed: Chest;Right Arm;Left  Arm;Abdomen;Left upper leg;Right upper leg;Buttocks;Left lower leg (including foot);Right lower leg (including foot);Front perineal area Bathing: 5: Supervision: Safety issues/verbal cues  FIM - Upper Body Dressing/Undressing Upper body dressing/undressing steps patient completed: Thread/unthread right sleeve of pullover shirt/dresss;Thread/unthread left sleeve of pullover shirt/dress;Put  head through opening of pull over shirt/dress;Pull shirt over trunk Upper body dressing/undressing: 5: Set-up assist to: Obtain clothing/put away FIM - Lower Body Dressing/Undressing Lower body dressing/undressing steps patient completed: Thread/unthread right underwear leg;Thread/unthread left underwear leg;Pull underwear up/down;Thread/unthread right pants leg;Thread/unthread left pants leg;Pull pants up/down Lower body dressing/undressing: 4: Min-Patient completed 75 plus % of tasks  FIM - Toileting Toileting steps completed by patient: Adjust clothing prior to toileting;Performs perineal hygiene;Adjust clothing after toileting Toileting Assistive Devices: Grab bar or rail for support Toileting: 5: Supervision: Safety issues/verbal cues  FIM - Diplomatic Services operational officer Devices: Grab bars;Walker Toilet Transfers: 0-Activity did not occur  FIM - Banker Devices: Bed rails;Arm rests Bed/Chair Transfer: 5: Supine > Sit: Supervision (verbal cues/safety issues);5: Sit > Supine: Supervision (verbal cues/safety issues);5: Bed > Chair or W/C: Supervision (verbal cues/safety issues);5: Chair or W/C > Bed: Supervision (verbal cues/safety issues)  FIM - Locomotion: Wheelchair Locomotion: Wheelchair: 1: Total Assistance/staff pushes wheelchair (Pt<25%) FIM - Locomotion: Ambulation Locomotion: Ambulation Assistive Devices: Designer, industrial/product Ambulation/Gait Assistance: 5: Supervision Locomotion: Ambulation: 5: Travels 150 ft or more with supervision/safety issues  Comprehension Comprehension Mode: Auditory Comprehension: 5-Follows basic conversation/direction: With extra time/assistive device  Expression Expression Mode: Nonverbal Expression: 4-Expresses basic 75 - 89% of the time/requires cueing 10 - 24% of the time. Needs helper to occlude trach/needs to repeat words.  Social Interaction Social Interaction: 5-Interacts appropriately  90% of the time - Needs monitoring or encouragement for participation or interaction.  Problem Solving Problem Solving: 4-Solves basic 75 - 89% of the time/requires cueing 10 - 24% of the time  Memory Memory: 4-Recognizes or recalls 75 - 89% of the time/requires cueing 10 - 24% of the time  Medical Problem List and Plan:  1. DVT Prophylaxis/Anticoagulation: Pharmaceutical: Lovenox  2. Pain Management: Will monitor for symptoms. Denies any problems but used percocet at home per records.  3. Metabolic encephalopathy/Mood: Poor insight/ awareness due to ongoing confusion.  4. Neuropsych: This patient is not capable of making decisions on his own behalf.  5. Salmonella bacteremia: IV antibiotic D# 18/42.  6. Agitation: continue Seroquel at bedtime with prn ativan. May need to titrate for better control.  7. Diarrhea: Likely due to antibiotics. Will add probiotic. Po intake has been poor.  8. ABLA: Will recheck on Monday.  9.  Hypokalemia, has had diarrhea recently. Will give by mouth KCl recheck bmet  LOS (Days) 5 A FACE TO FACE EVALUATION WAS PERFORMED  Xzavion Doswell E 10/13/2012, 7:24 AM

## 2012-10-14 ENCOUNTER — Inpatient Hospital Stay (HOSPITAL_COMMUNITY): Payer: Medicare Other | Admitting: Physical Therapy

## 2012-10-14 ENCOUNTER — Inpatient Hospital Stay (HOSPITAL_COMMUNITY): Payer: Medicare Other | Admitting: Speech Pathology

## 2012-10-14 ENCOUNTER — Inpatient Hospital Stay (HOSPITAL_COMMUNITY): Payer: Medicare Other

## 2012-10-14 ENCOUNTER — Inpatient Hospital Stay (HOSPITAL_COMMUNITY): Payer: Medicare Other | Admitting: Occupational Therapy

## 2012-10-14 DIAGNOSIS — G9341 Metabolic encephalopathy: Secondary | ICD-10-CM

## 2012-10-14 DIAGNOSIS — R5381 Other malaise: Secondary | ICD-10-CM

## 2012-10-14 LAB — URINE CULTURE: Colony Count: >=100000

## 2012-10-14 LAB — BASIC METABOLIC PANEL
Calcium: 8.1 mg/dL — ABNORMAL LOW (ref 8.4–10.5)
GFR calc Af Amer: >90 mL/min (ref >90)
GFR calc non Af Amer: >90 mL/min (ref >90)
Glucose, Bld: 94 mg/dL (ref 70–99)
Potassium: 4.1 mEq/L (ref 3.5–5.1)
Sodium: 138 mEq/L (ref 135–145)

## 2012-10-14 NOTE — Progress Notes (Signed)
Occupational Therapy Session Note  Patient Details  Name: Charles [name] MRN: [id number] Date of Birth: [birth year]  Today's Date: 10/14/2012 Time: 0915-0940; refused to participate for 2nd session Time Calculation (min): 25 min (missed 20 min for first visit); missed 30 min for second visit  Short Term Goals: Week 1:  OT Short Term Goal 1 (Week 1): Patient will increase grooming to supervision. OT Short Term Goal 2 (Week 1): Patient will increase UB dressing to Min Assist. OT Short Term Goal 3 (Week 1): Patient will increase LB dressing to Max Assist. OT Short Term Goal 4 (Week 1): Patient will increase toileting to Mod Assist. OT Short Term Goal 5 (Week 1): Patient will increase toilet transfer to Min Assist.  Skilled Therapeutic Interventions/Progress Updates:    Visit 1:  Pt scheduled for BADL retraining of toileting, bathing, and dressing with a focus on functional mobility, balance, and safety awareness.  Pt propelled w/c from day room to his room and he did not need cuing to find his room. He then completed grooming and oral care at the sink.  When it was time to enter shower, pt refused stating he did not want to bathe until his family brought him some clothing.  Attempted to negotiate with patient to complete bathing and then don new hospital gown, but he continued to refuse.  He also refused to go to gym for there ex or to work in his room.  He was quite distracted today.  Pt left in w/c with quick release belt and call light in place.   Visit 2: Pt in bed sleeping.  He refused to get out of bed as family had not brought his clothing. He stated he was not feeling well and did not want to do any exercise.  Pt left in bed.   Therapy Documentation Precautions:  Precautions Precautions: Fall Restrictions Weight Bearing Restrictions: No  General Amount of Missed OT Time (min): 20 Minutes Vital Signs:   Pain: No c/o pain Pain Assessment Pain Assessment: No/denies pain ADL:  See FIM for current functional status  Therapy/Group: Individual Therapy  Maribelle Hopple 10/14/2012, 11:24 AM

## 2012-10-14 NOTE — Progress Notes (Signed)
Speech Language Pathology Daily Session Note  Patient Details  Name: Charles [name] MRN: [id number] Date of Birth: 08/21/1945  Today's Date: 10/14/2012 Time: 2536-6440 Time Calculation (min): 40 min  Short Term Goals: Week 1: SLP Short Term Goal 1 (Week 1): Patient will consume regular textures and thin liquids with no overt s/s of aspiration and Supervision vebral cues for small bites and sips. SLP Short Term Goal 2 (Week 1): Patient will verbalize orientation with Mod assist question cues. SLP Short Term Goal 3 (Week 1): Patient will request help as needed with call bell with Max assist verbal cues. SLP Short Term Goal 4 (Week 1): Patient sustain attention to basic, familiar task for 5 minutes with Mod assist cues for re-direction. SLP Short Term Goal 5 (Week 1): Patient willl increase speech intelligibility at the phrase level with Min assist verbal cues.  Skilled Therapeutic Interventions: Skilled treatment session focused on addressing and cognitive goals during self-care tasks.  Patient able to safely consume regular textures and thin liquids with increased wait time for mastication and clearing oral residue.  SLP facilitated session with Supervision level verbal cues to set-up tray, locate all items as well as solve basic problems, once eating no other cues required.  SLP also facilitated session with Mod assist cues for orientation with use of external aids and awareness of current deficits.  Patietn able to verablly state procedures for utilizing call bell with Supervision level verbal cues.    FIM:  Comprehension Comprehension Mode: Auditory Comprehension: 5-Follows basic conversation/direction: With extra time/assistive device Expression Expression Mode: Verbal Expression: 3-Expresses basic 50 - 74% of the time/requires cueing 25 - 50% of the time. Needs to repeat parts of sentences. Social Interaction Social Interaction: 5-Interacts appropriately 90% of the time - Needs  monitoring or encouragement for participation or interaction. Problem Solving Problem Solving: 4-Solves basic 75 - 89% of the time/requires cueing 10 - 24% of the time Memory Memory: 2-Recognizes or recalls 25 - 49% of the time/requires cueing 51 - 75% of the time FIM - Eating Eating Activity: 5: Supervision/cues  Pain Pain Assessment Pain Assessment: No/denies pain  Therapy/Group: Individual Therapy  Charlane Ferretti., CCC-SLP [phone]  [name] 10/14/2012, 10:25 AM

## 2012-10-14 NOTE — Progress Notes (Signed)
Physical Therapy Session Note  Patient Details  Name: Charles [name] MRN: [id number] Date of Birth: 06-Mar-1946  Today's Date: 10/14/2012 Time:14:19 Time Calculation (min): missed time 60 mins  Short Term Goals: Week 1:  PT Short Term Goal 1 (Week 1): Same as LTGs  Skilled Therapeutic Interventions/Progress Updates:    Pt reports his stomach has hurt since he had to take "those horse pills" (RN reports multi vitamin).  He is refusing to participate with PT.  RN made aware.    Therapy Documentation Precautions:  Precautions Precautions: Fall Restrictions Weight Bearing Restrictions: No General: Amount of Missed PT Time (min): 60 Minutes Missed Time Reason: Patient ill (comment) (stomach hurts-RN aware)  See FIM for current functional status  Therapy/Group: Individual Therapy  Lurena Joiner B. Azaylah Stailey, PT, DPT [phone]   10/14/2012, 2:34 PM

## 2012-10-14 NOTE — Patient Care Conference (Signed)
Inpatient RehabilitationTeam Conference and Plan of Care Update Date: 10/13/2012   Time: 10:30 AM    Patient Name: Charles [name]      Medical Record Number: [id number]  Date of Birth: [birth year] Sex: Male         Room/Bed: 4028/4028-01 Payor Info: Payor: MEDICARE / Plan: MEDICARE PART A AND B / Product Type: *No Product type* /    Admitting Diagnosis: deconditioning encephalopathy  Admit Date/Time:  10/08/2012  3:49 PM Admission Comments: No comment available   Primary Diagnosis:  Encephalopathy, metabolic Principal Problem: Encephalopathy, metabolic  Patient Active Problem List   Diagnosis Date Noted  . Encephalopathy, metabolic 10/11/2012  . Acute post-hemorrhagic anemia 10/11/2012  . Delirium 10/04/2012  . Salmonella bacteremia 09/25/2012  . Bacteremia 09/23/2012  . Acute respiratory failure 09/22/2012  . Hyponatremia 09/21/2012  . Adult failure to thrive 09/21/2012  . Abdominal pain 09/21/2012  . Dehydration 09/21/2012  . Generalized weakness 09/21/2012  . AAA (abdominal aortic aneurysm, ruptured) 09/21/2012  . New onset atrial fibrillation 09/21/2012  . Alcohol abuse 09/21/2012    Expected Discharge Date: Expected Discharge Date: 10/20/12  Team Members Present: Physician leading conference: Dr. Claudette Laws Social Worker Present: Amada Jupiter, LCSW PT Present: Wanda Plump, PT OT Present: Bretta Bang, Verlene Mayer, OT SLP Present: Fae Pippin, SLP Other (Discipline and Name): Tora Duck, PPS Coordinator     Current Status/Progress Goal Weekly Team Focus  Medical   in enclosure bed,good wound healing, chronic debility from ETOH  Monitor late withdrawl  establish physical and cogni andelinetive bas   Bowel/Bladder   continent of bowel and bladder; uses, positions urinal, helper empties; goes to the bathroom for BM  remain continent of bowel and bladder with supervision  use bathroom during the day; urinal as needed at night   Swallow/Nutrition/  Hydration   regular and thin with set-up and interittent sueprvision   least restrictive p.o. intake  increase awareness   ADL's   Overall Supervision to min assist  Overall Supervision  activity tolerance, dynamic balance and increased awareness during BADL tasks, patient and caregiver education   Mobility   supervision ambulation and stairs  supervision ambulation/stairs; min assist community  activity tolerance, high level balance and gait, stairs, patient/family education   Communication   mod assist   supervision   increase awarenss, self-monitoring and correcting   Safety/Cognition/ Behavioral Observations  mod-max assist   supervision   increase awareness, recall and basic problem solving   Pain   no complaints of pain  no pain, or controlled less than 3  assess and monitor for pain qshift and as needed   Skin   incisions healing, clean, dry, intact, open to air  no infections or new skin breakdown at discharge  monitor incisions, assess skin qshift    Rehab Goals Patient on target to meet rehab goals: Yes *See Care Plan and progress notes for long and short-term goals.  Barriers to Discharge: will need 24/7 sup    Possible Resolutions to Barriers:  need to identify potential caregivers    Discharge Planning/Teaching Needs:  home with family providing 24/7 supervision vs. SNF - to confirm      Team Discussion:  Reaching supervision goals with ambulation and cognition slowly improving.  Need to confirm if 24/7 truly available at home - SW to follow up.  Revisions to Treatment Plan:  None   Continued Need for Acute Rehabilitation Level of Care: The patient requires daily medical management by a physician  with specialized training in physical medicine and rehabilitation for the following conditions: Daily direction of a multidisciplinary physical rehabilitation program to ensure safe treatment while eliciting the highest outcome that is of practical value to the patient.:  Yes Daily medical management of patient stability for increased activity during participation in an intensive rehabilitation regime.: Yes Daily analysis of laboratory values and/or radiology reports with any subsequent need for medication adjustment of medical intervention for : Post surgical problems;Other  Dalani Mette 10/14/2012, 4:30 PM

## 2012-10-14 NOTE — Progress Notes (Signed)
Physical Therapy Note  Patient Details  Name: Charles [name] MRN: [id number] Date of Birth: 1945/06/10 Today's Date: 10/14/2012  Patient reported stomach issues and not feeling well and refused pm session. Attempted to see patient later in the day, patient refused that as well.    Arelia Longest M 10/14/2012, 2:50 PM

## 2012-10-14 NOTE — Progress Notes (Signed)
Social Work Patient ID: Charles [name], male   DOB: [birth year], 67 y.o.   MRN: [id number]  Have reviewed team conference with pt and daughter and son.  Have explained that pt will require 24/7 supervision as well as home IV abx for several more weeks.  Pt's children do NOT feel that this is care that pt's sister can provide and feel SNF is more appropriate plan.  Have spoken with pt who is reluctantly agreeable.  Will transfer case over to [facility], LCSW who will follow through with this case to d/c.  Charles Ekstrand, LCSW

## 2012-10-14 NOTE — Progress Notes (Signed)
Patient ID: Charles [name], male   DOB: [birth year], 67 y.o.   MRN: [id number] Subjective/Complaints: 67 year old patient admitted to the rehabilitation service  10/08/12 with severe deconditioning following treatment of a ruptured AAA approximately 3 weeks ago. Hospital course complicated by Salmonella bacteremia and he is completing IV Rocephin. He has a history of LV dysfunction, history of alcohol abuse he continues to have issues with confusion and intermittent paranoia. He remains quite weak but his delirium has improved.  Enclosed bed  Off enclosure bed last noc,no problems reported Review of Systems  Unable to perform ROS: mental acuity   Objective: Vital Signs: Blood pressure 143/85, pulse 81, temperature 98.9 F (37.2 C), temperature source Oral, resp. rate 20, height 5\' 7"  (1.702 m), SpO2 98.00%. No results found. Results for orders placed during the hospital encounter of 10/08/12 (from the past 72 hour(s))  BASIC METABOLIC PANEL     Status: Abnormal   Collection Time    10/12/12  8:33 AM      Result Value Range   Sodium 136  135 - 145 mEq/L   Potassium 4.9  3.5 - 5.1 mEq/L   Comment: HEMOLYSIS AT THIS LEVEL MAY AFFECT RESULT   Chloride 105  96 - 112 mEq/L   CO2 23  19 - 32 mEq/L   Glucose, Bld 118 (*) 70 - 99 mg/dL   BUN 14  6 - 23 mg/dL   Creatinine, Ser 4.09  0.50 - 1.35 mg/dL   Calcium 8.2 (*) 8.4 - 10.5 mg/dL   GFR calc non Af Amer >90  >90 mL/min   GFR calc Af Amer >90  >90 mL/min   Comment:            The eGFR has been calculated     using the CKD EPI equation.     This calculation has not been     validated in all clinical     situations.     eGFR's persistently     <90 mL/min signify     possible Chronic Kidney Disease.     HEENT: poor dentition Cardio: RRR and no murmurs Resp: CTA B/L and breathing unlabored GI: BS positive and nondistended Extremity:  No Edema Skin:   Wound C/D/I Neuro: Alert/Oriented, Confused, Cranial Nerve II-XII normal and Abnormal  Motor 4/5 bilateral deltoid, biceps, triceps, grip, hip flexor, knee extensors, ankle dorsiflexor plantar flexor Musc/Skel:  Other atrophy of both upper and lower extremity  Gen. no acute distress   Assessment/Plan: 1. Functional deficits secondary to Deconditioning which require 3+ hours per day of interdisciplinary therapy in a comprehensive inpatient rehab setting. Physiatrist is providing close team supervision and 24 hour management of active medical problems listed below. Physiatrist and rehab team continue to assess barriers to discharge/monitor patient progress toward functional and medical goals. FIM: FIM - Bathing Bathing Steps Patient Completed: Chest;Right Arm;Left Arm;Abdomen;Left upper leg;Right upper leg;Buttocks;Left lower leg (including foot);Right lower leg (including foot);Front perineal area Bathing: 5: Supervision: Safety issues/verbal cues  FIM - Upper Body Dressing/Undressing Upper body dressing/undressing steps patient completed: Thread/unthread right sleeve of pullover shirt/dresss;Thread/unthread left sleeve of pullover shirt/dress;Put head through opening of pull over shirt/dress;Pull shirt over trunk Upper body dressing/undressing: 5: Set-up assist to: Obtain clothing/put away FIM - Lower Body Dressing/Undressing Lower body dressing/undressing steps patient completed: Thread/unthread right underwear leg;Thread/unthread left underwear leg;Pull underwear up/down;Thread/unthread right pants leg;Thread/unthread left pants leg;Pull pants up/down Lower body dressing/undressing: 4: Min-Patient completed 75 plus % of tasks  FIM - Interior and spatial designer  steps completed by patient: Adjust clothing prior to toileting;Adjust clothing after toileting (urinated in standing) Toileting Assistive Devices: Grab bar or rail for support Toileting: 5: Supervision: Safety issues/verbal cues  FIM - Diplomatic Services operational officer Devices:  (completed in standing) Toilet  Transfers: 5-To toilet/BSC: Supervision (verbal cues/safety issues);5-From toilet/BSC: Supervision (verbal cues/safety issues)  FIM - Press photographer Assistive Devices: Arm rests Bed/Chair Transfer: 5: Supine > Sit: Supervision (verbal cues/safety issues);5: Bed > Chair or W/C: Supervision (verbal cues/safety issues);5: Chair or W/C > Bed: Supervision (verbal cues/safety issues)  FIM - Locomotion: Wheelchair Locomotion: Wheelchair: 1: Total Assistance/staff pushes wheelchair (Pt<25%) FIM - Locomotion: Ambulation Locomotion: Ambulation Assistive Devices: Designer, industrial/product Ambulation/Gait Assistance: 5: Supervision Locomotion: Ambulation: 5: Travels 150 ft or more with supervision/safety issues  Comprehension Comprehension Mode: Auditory Comprehension: 5-Follows basic conversation/direction: With extra time/assistive device  Expression Expression Mode: Verbal Expression: 3-Expresses basic 50 - 74% of the time/requires cueing 25 - 50% of the time. Needs to repeat parts of sentences.  Social Interaction Social Interaction: 5-Interacts appropriately 90% of the time - Needs monitoring or encouragement for participation or interaction.  Problem Solving Problem Solving: 4-Solves basic 75 - 89% of the time/requires cueing 10 - 24% of the time  Memory Memory: 2-Recognizes or recalls 25 - 49% of the time/requires cueing 51 - 75% of the time  Medical Problem List and Plan:  1. DVT Prophylaxis/Anticoagulation: Pharmaceutical: Lovenox  2. Pain Management: Will monitor for symptoms. Denies any problems but used percocet at home per records.  3. Metabolic encephalopathy/Mood: Poor insight/ awareness due to ongoing confusion.  4. Neuropsych: This patient is not capable of making decisions on his own behalf.  5. Salmonella bacteremia: IV antibiotic D# 19/42.  6. Agitation: continue Seroquel at bedtime with prn ativan. May need to titrate for better control.  7. Diarrhea:  Likely due to antibiotics. Will add probiotic. Po intake has been poor.  8. ABLA: Will recheck on Monday.  9.  Hypokalemia, has had diarrhea recently.  KCl po bmet improved, monitor  LOS (Days) 6 A FACE TO FACE EVALUATION WAS PERFORMED  [name],[name] 10/14/2012, 6:39 AM

## 2012-10-15 ENCOUNTER — Inpatient Hospital Stay (HOSPITAL_COMMUNITY): Payer: Medicare Other | Admitting: Speech Pathology

## 2012-10-15 ENCOUNTER — Inpatient Hospital Stay (HOSPITAL_COMMUNITY): Payer: Medicare Other | Admitting: Occupational Therapy

## 2012-10-15 ENCOUNTER — Inpatient Hospital Stay (HOSPITAL_COMMUNITY): Payer: Medicare Other | Admitting: Physical Therapy

## 2012-10-15 DIAGNOSIS — R5381 Other malaise: Secondary | ICD-10-CM

## 2012-10-15 DIAGNOSIS — G9341 Metabolic encephalopathy: Secondary | ICD-10-CM

## 2012-10-15 LAB — CULTURE, BLOOD (ROUTINE X 2)

## 2012-10-15 LAB — BASIC METABOLIC PANEL
Chloride: 104 mEq/L (ref 96–112)
GFR calc Af Amer: >90 mL/min (ref >90)
GFR calc non Af Amer: >90 mL/min (ref >90)
Potassium: 4.1 mEq/L (ref 3.5–5.1)
Sodium: 137 mEq/L (ref 135–145)

## 2012-10-15 NOTE — Progress Notes (Addendum)
NUTRITION FOLLOW UP  DOCUMENTATION CODES  Per approved criteria   -Severe malnutrition in the context of chronic illness    Intervention:   Discontinue Ensure Complete - pt refusing. Offered additional supplements however pt refused everything offered. Continue least restrictive diet. Consider appetite stimulant.  Nutrition Dx:   Inadequate oral intake related to poor appetite as evidenced by severe muscle and fat mass wasting.  Goal:   Intake to meet >90% of estimated nutrition needs  Monitor:   weight trends, lab trends, I/O's, PO intake, supplement tolerance  Assessment:   83-month history of abdominal pain, low back pain with LLE weakness with problems walking and difficulty with oral intake. 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 He was taken to OR emergently for AAA repair with left common iliac to right common femoral bypass, aortic endarterectomy, right femoral endarterectomy profundaplasty, placement abdominal VAC. Abdominal wound closure done on 4/25. Significant diarrhea requiring rectal tube. C-diff negative. Hx of alcoholism.  Enclosure bed discontinued 5/14. Expected d/c date of 5/21 for SNF.  Discussed oral intake with patient and family. Pt reports that he does not like the Ensure drinks, they cause him to have stomach aches. Family at bedside encouraging pt to drink them to help build him up, however he is refusing stating "water has all the calories that I need." Discussed additional supplements however pt refusing all options.  Height: Ht Readings from Last 1 Encounters:  10/11/12 5\' 7"  (1.702 m)    Weight Status:   Wt Readings from Last 1 Encounters:  10/05/12 126 lb 15.8 oz (57.6 kg)  Admit wt to hospital on 4/22 - 99 lb  Re-estimated needs:  Kcal: 1500 - 1700 Protein: 85 - 100 g Fluid: 1.5 - 1.7 L/d  Skin: abdomen, R groin, and R leg incision  Diet Order: General   Intake/Output Summary  (Last 24 hours) at 10/15/12 1402 Last data filed at 10/15/12 1305  Gross per 24 hour  Intake    360 ml  Output    300 ml  Net     60 ml    Last BM: 5/16   Labs:   Recent Labs Lab 10/12/12 0833 10/14/12 0550 10/15/12 0650  NA 136 138 137  K 4.9 4.1 4.1  CL 105 105 104  CO2 23 23 26   BUN 14 13 14   CREATININE 0.65 0.62 0.70  CALCIUM 8.2* 8.1* 8.2*  GLUCOSE 118* 94 91    CBG (last 3)  No results found for this basename: GLUCAP,  in the last 72 hours  Scheduled Meds: . antiseptic oral rinse  15 mL Mouth Rinse QID  . cefTRIAXone (ROCEPHIN)  IV  2 g Intravenous Q24H  . chlorhexidine  15 mL Mouth Rinse BID  . enoxaparin (LOVENOX) injection  40 mg Subcutaneous Q24H  . feeding supplement  120 mL Oral TID WC & HS  . metoprolol tartrate  50 mg Oral BID  . multivitamin with minerals  1 tablet Oral Daily  . QUEtiapine  50 mg Oral QHS  . saccharomyces boulardii  250 mg Oral BID    Continuous Infusions:  none  Charles Motto MS, RD, LDN Pager: [phone] After-hours pager: [phone]

## 2012-10-15 NOTE — Progress Notes (Signed)
Social Work Patient ID: Charles [name], male   DOB: 1946-05-05, 67 y.o.   MRN: [id number] [facility] has been faxed out, awaiting bed offers.  Pt is aware and family aware .

## 2012-10-15 NOTE — Progress Notes (Signed)
Physical Therapy Weekly Progress Note  Patient Details  Name: Charles [name] MRN: [id number] Date of Birth: 1945/08/20  Today's Date: 10/15/2012  Patient is progressing towards long term goals and is overall close supervision to occasional min assist.  Patient continues to demonstrate the following deficits: decreased activity tolerance, generalized weakness, dependency in mobility and therefore will continue to benefit from skilled PT intervention to enhance overall performance with activity tolerance and balance.  Patient progressing toward long term goals..  Continue plan of care. Patient's discharge has changed to nursing home placement.  PT Short Term Goals Week 1:  PT Short Term Goal 1 (Week 1): Same as LTGs  Therapy Documentation Precautions:  Precautions Precautions: Fall Restrictions Weight Bearing Restrictions: No  See FIM for current functional status    Arelia Longest M 10/15/2012, 4:21 PM

## 2012-10-15 NOTE — Progress Notes (Signed)
Speech Language Pathology Daily Session Note  Patient Details  Name: Charles [name] MRN: [id number] Date of Birth: 29-Apr-1946  Today's Date: 10/15/2012 Time: 1400-1445 Time Calculation (min): 45 min  Short Term Goals: Week 1: SLP Short Term Goal 1 (Week 1): Patient will consume regular textures and thin liquids with no overt s/s of aspiration and Supervision vebral cues for small bites and sips. SLP Short Term Goal 2 (Week 1): Patient will verbalize orientation with Mod assist question cues. SLP Short Term Goal 3 (Week 1): Patient will request help as needed with call bell with Max assist verbal cues. SLP Short Term Goal 4 (Week 1): Patient sustain attention to basic, familiar task for 5 minutes with Mod assist cues for re-direction. SLP Short Term Goal 5 (Week 1): Patient willl increase speech intelligibility at the phrase level with Min assist verbal cues.  Skilled Therapeutic Interventions: Treatment focus on cognitive goals. Upon entering the room, the pt was asleep in bed and required extra time and Min verbal and tactile cues for arousal. Pt then transferred to his wheelchair with hand help assist. SLP facilitated session by providing Mod question cues for orientation to time and situation, but was independently oriented to place.  Pt was unable to recall previous event of getting his hair cut despite total A multimodal cueing. SLP also facilitated session by providing Min verbal and visual cues for functional problem solving with basic money and medication management task.  Pt agreed to sit up in his wheelchair until his next session.    FIM:  Comprehension Comprehension Mode: Auditory Comprehension: 5-Follows basic conversation/direction: With extra time/assistive device Expression Expression Mode: Verbal Expression: 4-Expresses basic 75 - 89% of the time/requires cueing 10 - 24% of the time. Needs helper to occlude trach/needs to repeat words. Social Interaction Social  Interaction: 5-Interacts appropriately 90% of the time - Needs monitoring or encouragement for participation or interaction. Problem Solving Problem Solving: 4-Solves basic 75 - 89% of the time/requires cueing 10 - 24% of the time Memory Memory: 2-Recognizes or recalls 25 - 49% of the time/requires cueing 51 - 75% of the time  Pain Pain Assessment Pain Assessment: No/denies pain  Therapy/Group: Individual Therapy  Charles [name] 10/15/2012, 3:26 PM

## 2012-10-15 NOTE — Progress Notes (Signed)
Occupational Therapy Weekly Progress Note  Patient Details  Name: Charles [name] MRN: [id number] Date of Birth: Aug 21, 1945  Today's Date: 10/15/2012 Time: 0905-1002 Time Calculation (min): 57 min  Patient has met 5 of 5 short term goals.  Pt has made a great deal of progress and is now supervision with all self care and ADL transfers.  Patient continues to demonstrate the following deficits: decreased standing balance, deconditioning, and decreased UE strength and therefore will continue to benefit from skilled OT intervention to enhance overall performance with BADL.  Patient progressing toward long term goals..  Continue plan of care.  OT Short Term Goals Week 1:  OT Short Term Goal 1 (Week 1): Patient will increase grooming to supervision. OT Short Term Goal 1 - Progress (Week 1): Met OT Short Term Goal 2 (Week 1): Patient will increase UB dressing to Min Assist. OT Short Term Goal 2 - Progress (Week 1): Met OT Short Term Goal 3 (Week 1): Patient will increase LB dressing to Max Assist. OT Short Term Goal 3 - Progress (Week 1): Met OT Short Term Goal 4 (Week 1): Patient will increase toileting to Mod Assist. OT Short Term Goal 4 - Progress (Week 1): Met OT Short Term Goal 5 (Week 1): Patient will increase toilet transfer to Min Assist. OT Short Term Goal 5 - Progress (Week 1): Met Week 2:  OT Short Term Goal 1 (Week 2): Pt will consistenly perform his transfers with supervision using RW demonstrating proper hand placement with min cues.  Skilled Therapeutic Interventions/Progress Updates:   Pt seen for BADL retraining of toileting, bathing at shower level, and dressing with a focus on functional mobility and transfers. Pt demonstrated excellent participation and initiation. He was supervision with all tasks today.  Continue OT 1-2x a day for 5-7 days a week for 60-90 min with Balance/vestibular training;Self Care/advanced ADL retraining;UE/LE Coordination activities;Functional mobility  training;Community reintegration;Therapeutic Activities;Discharge planning;Patient/family education;Therapeutic Exercise;UE/LE Strength taining/ROM;DME/adaptive equipment instruction to maximize his independence with ADLs.  Pt will need to go to a SNF as he is continuing to receive IV medication and needs 24 hr supervision which the family is not able to provide at this time.  Therapy Documentation Precautions:  Precautions Precautions: Fall Restrictions Weight Bearing Restrictions: No   Pain: Pain Assessment Pain Assessment: No/denies pain ADL:  See FIM for current functional status  Therapy/Group: Individual Therapy  SAGUIER,JULIA 10/15/2012, 10:03 AM

## 2012-10-15 NOTE — Progress Notes (Signed)
Patient ID: Charles [name], male   DOB: 1945/07/21, 67 y.o.   MRN: [id number] Subjective/Complaints: 67 year old patient admitted to the rehabilitation service  10/08/12 with severe deconditioning following treatment of a ruptured AAA approximately 3 weeks ago. Hospital course complicated by Salmonella bacteremia and he is completing IV Rocephin. He has a history of LV dysfunction, history of alcohol abuse he continues to have issues with confusion and intermittent paranoia. He remains quite weak but his delirium has improved.  Enclosed bed  Off enclosure bed last noc,no problems reported, no signs of agitation, recognizes me as his physician Review of Systems  Unable to perform ROS: mental acuity   Objective: Vital Signs: Blood pressure 136/78, pulse 85, temperature 98.4 F (36.9 C), temperature source Oral, resp. rate 18, height 5\' 7"  (1.702 m), SpO2 98.00%. No results found. Results for orders placed during the hospital encounter of 10/08/12 (from the past 72 hour(s))  BASIC METABOLIC PANEL     Status: Abnormal   Collection Time    10/12/12  8:33 AM      Result Value Range   Sodium 136  135 - 145 mEq/L   Potassium 4.9  3.5 - 5.1 mEq/L   Comment: HEMOLYSIS AT THIS LEVEL MAY AFFECT RESULT   Chloride 105  96 - 112 mEq/L   CO2 23  19 - 32 mEq/L   Glucose, Bld 118 (*) 70 - 99 mg/dL   BUN 14  6 - 23 mg/dL   Creatinine, Ser 7.82  0.50 - 1.35 mg/dL   Calcium 8.2 (*) 8.4 - 10.5 mg/dL   GFR calc non Af Amer >90  >90 mL/min   GFR calc Af Amer >90  >90 mL/min   Comment:            The eGFR has been calculated     using the CKD EPI equation.     This calculation has not been     validated in all clinical     situations.     eGFR's persistently     <90 mL/min signify     possible Chronic Kidney Disease.  BASIC METABOLIC PANEL     Status: Abnormal   Collection Time    10/14/12  5:50 AM      Result Value Range   Sodium 138  135 - 145 mEq/L   Potassium 4.1  3.5 - 5.1 mEq/L   Chloride 105   96 - 112 mEq/L   CO2 23  19 - 32 mEq/L   Glucose, Bld 94  70 - 99 mg/dL   BUN 13  6 - 23 mg/dL   Creatinine, Ser 9.56  0.50 - 1.35 mg/dL   Calcium 8.1 (*) 8.4 - 10.5 mg/dL   GFR calc non Af Amer >90  >90 mL/min   GFR calc Af Amer >90  >90 mL/min   Comment:            The eGFR has been calculated     using the CKD EPI equation.     This calculation has not been     validated in all clinical     situations.     eGFR's persistently     <90 mL/min signify     possible Chronic Kidney Disease.     HEENT: poor dentition Cardio: RRR and no murmurs Resp: CTA B/L and breathing unlabored GI: BS positive and nondistended Extremity:  No Edema Skin:   Wound C/D/I Neuro: Alert/Oriented, Confused, Cranial Nerve II-XII normal and Abnormal Motor  4/5 bilateral deltoid, biceps, triceps, grip, hip flexor, knee extensors, ankle dorsiflexor plantar flexor Musc/Skel:  Other atrophy of both upper and lower extremity  Gen. no acute distress   Assessment/Plan: 1. Functional deficits secondary to Deconditioning which require 3+ hours per day of interdisciplinary therapy in a comprehensive inpatient rehab setting. Physiatrist is providing close team supervision and 24 hour management of active medical problems listed below. Physiatrist and rehab team continue to assess barriers to discharge/monitor patient progress toward functional and medical goals. FIM: FIM - Bathing Bathing Steps Patient Completed: Chest;Right Arm;Left Arm;Abdomen;Left upper leg;Right upper leg;Buttocks;Left lower leg (including foot);Right lower leg (including foot);Front perineal area Bathing: 5: Supervision: Safety issues/verbal cues  FIM - Upper Body Dressing/Undressing Upper body dressing/undressing steps patient completed: Thread/unthread right sleeve of pullover shirt/dresss;Thread/unthread left sleeve of pullover shirt/dress;Put head through opening of pull over shirt/dress;Pull shirt over trunk Upper body  dressing/undressing: 5: Set-up assist to: Obtain clothing/put away FIM - Lower Body Dressing/Undressing Lower body dressing/undressing steps patient completed: Thread/unthread right underwear leg;Thread/unthread left underwear leg;Pull underwear up/down;Thread/unthread right pants leg;Thread/unthread left pants leg;Pull pants up/down Lower body dressing/undressing: 4: Min-Patient completed 75 plus % of tasks  FIM - Toileting Toileting steps completed by patient: Adjust clothing prior to toileting;Adjust clothing after toileting (urinated in standing) Toileting Assistive Devices: Grab bar or rail for support Toileting: 5: Supervision: Safety issues/verbal cues  FIM - Diplomatic Services operational officer Devices:  (completed in standing) Toilet Transfers: 5-To toilet/BSC: Supervision (verbal cues/safety issues);5-From toilet/BSC: Supervision (verbal cues/safety issues)  FIM - Press photographer Assistive Devices: Arm rests Bed/Chair Transfer: 5: Supine > Sit: Supervision (verbal cues/safety issues);5: Bed > Chair or W/C: Supervision (verbal cues/safety issues);5: Chair or W/C > Bed: Supervision (verbal cues/safety issues)  FIM - Locomotion: Wheelchair Locomotion: Wheelchair: 1: Total Assistance/staff pushes wheelchair (Pt<25%) FIM - Locomotion: Ambulation Locomotion: Ambulation Assistive Devices: Designer, industrial/product Ambulation/Gait Assistance: 5: Supervision Locomotion: Ambulation: 5: Travels 150 ft or more with supervision/safety issues  Comprehension Comprehension Mode: Auditory Comprehension: 5-Follows basic conversation/direction: With extra time/assistive device  Expression Expression Mode: Verbal Expression: 3-Expresses basic 50 - 74% of the time/requires cueing 25 - 50% of the time. Needs to repeat parts of sentences.  Social Interaction Social Interaction: 5-Interacts appropriately 90% of the time - Needs monitoring or encouragement for participation or  interaction.  Problem Solving Problem Solving: 4-Solves basic 75 - 89% of the time/requires cueing 10 - 24% of the time  Memory Memory: 2-Recognizes or recalls 25 - 49% of the time/requires cueing 51 - 75% of the time  Medical Problem List and Plan:  1. DVT Prophylaxis/Anticoagulation: Pharmaceutical: Lovenox  2. Pain Management: Will monitor for symptoms. Denies any problems but used percocet at home per records.  3. Metabolic encephalopathy/Mood: Poor insight/ awareness due to ongoing confusion.  4. Neuropsych: This patient is not capable of making decisions on his own behalf.  5. Salmonella bacteremia: IV antibiotic D# 20/42.  6. Agitation: continue Seroquel at bedtime with prn ativan. May need to titrate for better control.  7. Diarrhea: Likely due to antibiotics. Will add probiotic. Po intake has been poor.  8. ABLA: Will recheck on Monday.  9.  Hypokalemia, has had diarrhea recently.  KCl po bmet improved, monitor  LOS (Days) 7 A FACE TO FACE EVALUATION WAS PERFORMED  [name],[name] 10/15/2012, 6:46 AM

## 2012-10-15 NOTE — Progress Notes (Signed)
Physical Therapy Note  Patient Details  Name: Charles [name] MRN: [id number] Date of Birth: [birth year] Today's Date: 10/15/2012  1000-1055 (55 minutes) individual Pain: no complaint of pain Focus of treatment: Therapeutic exercise focused on bilateral LE strengthening/ endurance/ activity tolerance; gait training/endurance including stairs Treatment: wc mobility 120 feet SBA unit using bilateral LEs for propulsion; transsfers with vcs for brakes, placement; stand /turn SBA ; Nustep Level 4 X 10 minutes ( with frequent rest breaks) ; standing exercises - bilateral hip flexion with /without UE support x 20 to 12 inch height close SBA with no loss of balance; up/down 6 inch step X 10 alternating LEs for quad strengthening; gait with/ without RW 150 feet x 2 close SBA .    7829-5621 (30 minutes) individual Pain: no complaint of pain Focus of treatment: Therapeutic activity focused on bilateral LE strengthening in closed chain; gait training/endurance Treatment: Kinetron in standing with UE support 3 x 20 reps with tactile cues to improve trunk extension; gait 100 feet close SBA with c/o fatigue post ambulation; returned to bed/alarm activated.   Charles [name],JIM 10/15/2012, 10:14 AM

## 2012-10-16 ENCOUNTER — Inpatient Hospital Stay (HOSPITAL_COMMUNITY): Payer: Medicare Other | Admitting: Occupational Therapy

## 2012-10-16 DIAGNOSIS — R5381 Other malaise: Secondary | ICD-10-CM

## 2012-10-16 DIAGNOSIS — G9341 Metabolic encephalopathy: Secondary | ICD-10-CM

## 2012-10-16 MED ORDER — HYDROMORPHONE HCL PF 1 MG/ML IJ SOLN
INTRAMUSCULAR | Status: AC
  Filled 2012-10-16: qty 2

## 2012-10-16 NOTE — Progress Notes (Signed)
Patient ID: Charles [name], male   DOB: August 27, 1945, 67 y.o.   MRN: [id number] Subjective/Complaints: 67 year old patient admitted to the rehabilitation service  10/08/12 with severe deconditioning following treatment of a ruptured AAA approximately 3 weeks ago. Hospital course complicated by Salmonella bacteremia and he is completing IV Rocephin. He has a history of LV dysfunction, history of alcohol abuse he continues to have issues with confusion and intermittent paranoia. He remains quite weak but his delirium has improved.  Enclosed bed  No problems over night. Slow to arouse this am  Review of Systems  Unable to perform ROS: mental acuity   Objective: Vital Signs: Blood pressure 147/88, pulse 82, temperature 98.6 F (37 C), temperature source Oral, resp. rate 17, height 5\' 7"  (1.702 m), SpO2 98.00%. No results found. Results for orders placed during the hospital encounter of 10/08/12 (from the past 72 hour(s))  BASIC METABOLIC PANEL     Status: Abnormal   Collection Time    10/14/12  5:50 AM      Result Value Range   Sodium 138  135 - 145 mEq/L   Potassium 4.1  3.5 - 5.1 mEq/L   Chloride 105  96 - 112 mEq/L   CO2 23  19 - 32 mEq/L   Glucose, Bld 94  70 - 99 mg/dL   BUN 13  6 - 23 mg/dL   Creatinine, Ser 5.62  0.50 - 1.35 mg/dL   Calcium 8.1 (*) 8.4 - 10.5 mg/dL   GFR calc non Af Amer >90  >90 mL/min   GFR calc Af Amer >90  >90 mL/min   Comment:            The eGFR has been calculated     using the CKD EPI equation.     This calculation has not been     validated in all clinical     situations.     eGFR's persistently     <90 mL/min signify     possible Chronic Kidney Disease.  BASIC METABOLIC PANEL     Status: Abnormal   Collection Time    10/15/12  6:50 AM      Result Value Range   Sodium 137  135 - 145 mEq/L   Potassium 4.1  3.5 - 5.1 mEq/L   Chloride 104  96 - 112 mEq/L   CO2 26  19 - 32 mEq/L   Glucose, Bld 91  70 - 99 mg/dL   BUN 14  6 - 23 mg/dL   Creatinine,  Ser 1.30  0.50 - 1.35 mg/dL   Calcium 8.2 (*) 8.4 - 10.5 mg/dL   GFR calc non Af Amer >90  >90 mL/min   GFR calc Af Amer >90  >90 mL/min   Comment:            The eGFR has been calculated     using the CKD EPI equation.     This calculation has not been     validated in all clinical     situations.     eGFR's persistently     <90 mL/min signify     possible Chronic Kidney Disease.     HEENT: poor dentition Cardio: RRR and no murmurs Resp: CTA B/L and breathing unlabored GI: BS positive and nondistended Extremity:  No Edema Skin:   Wound C/D/I Neuro: Alert/Oriented, Confused, Cranial Nerve II-XII normal and Abnormal Motor 4/5 bilateral deltoid, biceps, triceps, grip, hip flexor, knee extensors, ankle dorsiflexor plantar flexor Musc/Skel:  Other atrophy of both upper and lower extremity  Gen. no acute distress   Assessment/Plan: 1. Functional deficits secondary to Deconditioning which require 3+ hours per day of interdisciplinary therapy in a comprehensive inpatient rehab setting. Physiatrist is providing close team supervision and 24 hour management of active medical problems listed below. Physiatrist and rehab team continue to assess barriers to discharge/monitor patient progress toward functional and medical goals. FIM: FIM - Bathing Bathing Steps Patient Completed: Chest;Right Arm;Left Arm;Abdomen;Left upper leg;Right upper leg;Buttocks;Left lower leg (including foot);Right lower leg (including foot);Front perineal area Bathing: 5: Supervision: Safety issues/verbal cues  FIM - Upper Body Dressing/Undressing Upper body dressing/undressing steps patient completed: Thread/unthread right sleeve of pullover shirt/dresss;Thread/unthread left sleeve of pullover shirt/dress;Put head through opening of pull over shirt/dress;Pull shirt over trunk Upper body dressing/undressing: 5: Set-up assist to: Obtain clothing/put away FIM - Lower Body Dressing/Undressing Lower body  dressing/undressing steps patient completed: Thread/unthread right underwear leg;Thread/unthread left underwear leg;Pull underwear up/down;Thread/unthread right pants leg;Thread/unthread left pants leg;Pull pants up/down;Fasten/unfasten left shoe;Fasten/unfasten right shoe;Don/Doff right sock;Fasten/unfasten pants (pt did not want to wear socks) Lower body dressing/undressing: 5: Supervision: Safety issues/verbal cues  FIM - Toileting Toileting steps completed by patient: Adjust clothing prior to toileting;Performs perineal hygiene;Adjust clothing after toileting Toileting Assistive Devices: Grab bar or rail for support Toileting: 5: Supervision: Safety issues/verbal cues  FIM - Diplomatic Services operational officer Devices: Art gallery manager Transfers: 5-From toilet/BSC: Supervision (verbal cues/safety issues)  FIM - Banker Devices: Therapist, occupational: 5: Supine > Sit: Supervision (verbal cues/safety issues);5: Bed > Chair or W/C: Supervision (verbal cues/safety issues);5: Chair or W/C > Bed: Supervision (verbal cues/safety issues)  FIM - Locomotion: Wheelchair Locomotion: Wheelchair: 1: Total Assistance/staff pushes wheelchair (Pt<25%) FIM - Locomotion: Ambulation Locomotion: Ambulation Assistive Devices: Designer, industrial/product Ambulation/Gait Assistance: 5: Supervision Locomotion: Ambulation: 5: Travels 150 ft or more with supervision/safety issues  Comprehension Comprehension Mode: Auditory Comprehension: 5-Understands complex 90% of the time/Cues < 10% of the time  Expression Expression Mode: Verbal Expression: 5-Expresses complex 90% of the time/cues < 10% of the time  Social Interaction Social Interaction: 5-Interacts appropriately 90% of the time - Needs monitoring or encouragement for participation or interaction.  Problem Solving Problem Solving: 5-Solves complex 90% of the time/cues < 10% of the time  Memory Memory:  4-Recognizes or recalls 75 - 89% of the time/requires cueing 10 - 24% of the time  Medical Problem List and Plan:  1. DVT Prophylaxis/Anticoagulation: Pharmaceutical: Lovenox  2. Pain Management: Will monitor for symptoms. Denies any problems but used percocet at home per records.  3. Metabolic encephalopathy/Mood: Poor insight/ awareness due to ongoing confusion.  4. Neuropsych: This patient is not capable of making decisions on his own behalf.  5. Salmonella bacteremia: IV antibiotic D# 21/42.  6. Agitation: continue Seroquel at bedtime with prn ativan. May need to titrate for better control.  7. Diarrhea: Likely due to antibiotics. Will add probiotic. Po intake has been poor.  8. ABLA: Will recheck on Monday.  9.  Hypokalemia, has had diarrhea recently--resolved. Eating well.   LOS (Days) 8 A FACE TO FACE EVALUATION WAS PERFORMED  SWARTZ,ZACHARY T 10/16/2012, 8:15 AM

## 2012-10-16 NOTE — Progress Notes (Signed)
Occupational Therapy Session Note  Patient Details  Name: Charles [name] MRN: [id number] Date of Birth: 1945/11/08  Today's Date: 10/16/2012 Time: 1330-1400 Time Calculation (min): 30 min  Short Term Goals: Week 1:  OT Short Term Goal 1 (Week 1): Patient will increase grooming to supervision. OT Short Term Goal 1 - Progress (Week 1): Met OT Short Term Goal 2 (Week 1): Patient will increase UB dressing to Min Assist. OT Short Term Goal 2 - Progress (Week 1): Met OT Short Term Goal 3 (Week 1): Patient will increase LB dressing to Max Assist. OT Short Term Goal 3 - Progress (Week 1): Met OT Short Term Goal 4 (Week 1): Patient will increase toileting to Mod Assist. OT Short Term Goal 4 - Progress (Week 1): Met OT Short Term Goal 5 (Week 1): Patient will increase toilet transfer to Min Assist. OT Short Term Goal 5 - Progress (Week 1): Met Week 2:  OT Short Term Goal 1 (Week 2): Pt will consistenly perform his transfers with supervision using RW demonstrating proper hand placement with min cues.  Skilled Therapeutic Interventions/Progress Updates:    Pt resting in bed upon arrival. He sat up in bed and ate some ice cream and then donned sneakers and tied them to prepare for therapy.  Pt stood with supervision, ambulated in room and then to bathroom. Pt was able to toilet with distant supervision.  He stated that he had diarrhea for 2nd time today and suddenly felt light headed.  Vitals checked - wnl. Reported to nurse. Encouraged pt to drink water. Pt asked to lay down as he continued to feel light headed. Bed alarm reset with call light in reach.  Therapy Documentation Precautions:  Precautions Precautions: Fall Restrictions Weight Bearing Restrictions: No General: General Amount of Missed OT Time (min): 15 Minutes - pt feeling light headed, BP checked WNL- 124/76 , pulse 80, O2 98    Pain: Pain Assessment Pain Assessment: No/denies pain    See FIM for current functional  status  Therapy/Group: Individual Therapy  Chrissa Meetze 10/16/2012, 2:36 PM

## 2012-10-17 ENCOUNTER — Inpatient Hospital Stay (HOSPITAL_COMMUNITY): Payer: Medicare Other | Admitting: Physical Therapy

## 2012-10-17 NOTE — Progress Notes (Signed)
Patient ID: AAHAN MARQUES, male   DOB: Aug 15, 1945, 67 y.o.   MRN: [id number] Subjective/Complaints: 67 year old patient admitted to the rehabilitation service  10/08/12 with severe deconditioning following treatment of a ruptured AAA approximately 3 weeks ago. Hospital course complicated by Salmonella bacteremia and he is completing IV Rocephin. He has a history of LV dysfunction, history of alcohol abuse he continues to have issues with confusion and intermittent paranoia. He remains quite weak but his delirium has improved.  Enclosed bed  Awake and alert today. Denies pain or other problems.  Review of Systems  Unable to perform ROS: mental acuity   Objective: Vital Signs: Blood pressure 128/72, pulse 80, temperature 99.2 F (37.3 C), temperature source Oral, resp. rate 16, height 5\' 7"  (1.702 m), SpO2 98.00%. No results found. Results for orders placed during the hospital encounter of 10/08/12 (from the past 72 hour(s))  BASIC METABOLIC PANEL     Status: Abnormal   Collection Time    10/15/12  6:50 AM      Result Value Range   Sodium 137  135 - 145 mEq/L   Potassium 4.1  3.5 - 5.1 mEq/L   Chloride 104  96 - 112 mEq/L   CO2 26  19 - 32 mEq/L   Glucose, Bld 91  70 - 99 mg/dL   BUN 14  6 - 23 mg/dL   Creatinine, Ser 1.32  0.50 - 1.35 mg/dL   Calcium 8.2 (*) 8.4 - 10.5 mg/dL   GFR calc non Af Amer >90  >90 mL/min   GFR calc Af Amer >90  >90 mL/min   Comment:            The eGFR has been calculated     using the CKD EPI equation.     This calculation has not been     validated in all clinical     situations.     eGFR's persistently     <90 mL/min signify     possible Chronic Kidney Disease.     HEENT: poor dentition Cardio: RRR and no murmurs Resp: CTA B/L and breathing unlabored GI: BS positive and nondistended Extremity:  No Edema Skin:   Wound C/D/I Neuro: Alert/Oriented, Confused, Cranial Nerve II-XII normal and Abnormal Motor 4/5 bilateral deltoid, biceps, triceps,  grip, hip flexor, knee extensors, ankle dorsiflexor plantar flexor Musc/Skel:  Other atrophy of both upper and lower extremity  Gen. no acute distress   Assessment/Plan: 1. Functional deficits secondary to Deconditioning which require 3+ hours per day of interdisciplinary therapy in a comprehensive inpatient rehab setting. Physiatrist is providing close team supervision and 24 hour management of active medical problems listed below. Physiatrist and rehab team continue to assess barriers to discharge/monitor patient progress toward functional and medical goals. FIM: FIM - Bathing Bathing Steps Patient Completed: Chest;Right Arm;Left Arm;Abdomen;Left upper leg;Right upper leg;Buttocks;Left lower leg (including foot);Right lower leg (including foot);Front perineal area Bathing: 5: Supervision: Safety issues/verbal cues  FIM - Upper Body Dressing/Undressing Upper body dressing/undressing steps patient completed: Thread/unthread right sleeve of pullover shirt/dresss;Thread/unthread left sleeve of pullover shirt/dress;Put head through opening of pull over shirt/dress;Pull shirt over trunk Upper body dressing/undressing: 5: Set-up assist to: Obtain clothing/put away FIM - Lower Body Dressing/Undressing Lower body dressing/undressing steps patient completed: Thread/unthread right underwear leg;Thread/unthread left underwear leg;Pull underwear up/down;Thread/unthread right pants leg;Thread/unthread left pants leg;Pull pants up/down;Fasten/unfasten left shoe;Fasten/unfasten right shoe;Don/Doff right sock;Fasten/unfasten pants (pt did not want to wear socks) Lower body dressing/undressing: 5: Supervision: Safety issues/verbal cues  FIM - Toileting Toileting steps completed by patient: Performs perineal hygiene;Adjust clothing prior to toileting;Adjust clothing after toileting Toileting Assistive Devices: Grab bar or rail for support Toileting: 5: Supervision: Safety issues/verbal cues  FIM - Ambulance person Devices: Art gallery manager Transfers: 5-To toilet/BSC: Supervision (verbal cues/safety issues);5-From toilet/BSC: Supervision (verbal cues/safety issues)  FIM - Banker Devices: Therapist, occupational: 5: Bed > Chair or W/C: Supervision (verbal cues/safety issues);5: Chair or W/C > Bed: Supervision (verbal cues/safety issues)  FIM - Locomotion: Wheelchair Locomotion: Wheelchair: 1: Total Assistance/staff pushes wheelchair (Pt<25%) FIM - Locomotion: Ambulation Locomotion: Ambulation Assistive Devices: Designer, industrial/product Ambulation/Gait Assistance: 5: Supervision Locomotion: Ambulation: 5: Travels 150 ft or more with supervision/safety issues  Comprehension Comprehension Mode: Auditory Comprehension: 7-Follows complex conversation/direction: With no assist  Expression Expression Mode: Verbal Expression: 7-Expresses complex ideas: With no assist  Social Interaction Social Interaction: 7-Interacts appropriately with others - No medications needed.  Problem Solving Problem Solving: 7-Solves complex problems: Recognizes & self-corrects  Memory Memory: 7-Complete Independence: No helper  Medical Problem List and Plan:  1. DVT Prophylaxis/Anticoagulation: Pharmaceutical: Lovenox  2. Pain Management: Will monitor for symptoms. Denies any problems but used percocet at home per records.  3. Metabolic encephalopathy/Mood: Poor insight/ awareness--menta status does appear to be improving however 4. Neuropsych: This patient is not capable of making decisions on his own behalf.  5. Salmonella bacteremia: IV antibiotic D# 26/42.   -low grade temp this am---observe for now 6. Agitation: continue Seroquel at bedtime with prn ativan. May need to titrate for better control.  7. Diarrhea: Likely due to antibiotics. Will add probiotic. Po intake has been poor.  8. ABLA: Will recheck on Monday.  9.  Hypokalemia, has had  diarrhea recently--resolved. Eating well.   LOS (Days) 9 A FACE TO FACE EVALUATION WAS PERFORMED  SWARTZ,ZACHARY T 10/17/2012, 8:46 AM

## 2012-10-17 NOTE — Progress Notes (Signed)
Physical Therapy Note  Patient Details  Name: Charles [name] MRN: [id number] Date of Birth: [birth year] Today's Date: 10/17/2012  1000-1040 (40 minutes) individual Pain: no reported pain Focus of treatment: Therapeutic exercise focused on activity tolerance; gait training Treatment : gait to /from room (120 feet) min assist (no assistive device) with forward trunk lean and one standing rest break; Nustep Levcel 4 X 10 minutes with multiple rest breaks; standing raising yellow weighted ball from bedside table 2 X 10 for trunk extensor strengthening; gait 120 feet holding soccer ball at shoulder height to facilitate trunk extension in stance close SBA.    Alayja Armas,JIM 10/17/2012, 10:10 AM

## 2012-10-18 ENCOUNTER — Inpatient Hospital Stay (HOSPITAL_COMMUNITY): Payer: Medicare Other | Admitting: Speech Pathology

## 2012-10-18 ENCOUNTER — Encounter (HOSPITAL_COMMUNITY): Payer: Medicare Other

## 2012-10-18 ENCOUNTER — Inpatient Hospital Stay (HOSPITAL_COMMUNITY): Payer: Medicare Other

## 2012-10-18 ENCOUNTER — Inpatient Hospital Stay (HOSPITAL_COMMUNITY): Payer: Medicare Other | Admitting: Occupational Therapy

## 2012-10-18 DIAGNOSIS — R5381 Other malaise: Secondary | ICD-10-CM

## 2012-10-18 DIAGNOSIS — G9341 Metabolic encephalopathy: Secondary | ICD-10-CM

## 2012-10-18 MED ORDER — ADULT MULTIVITAMIN W/MINERALS CH: 1.0000 | ORAL_TABLET | Freq: Every day | ORAL | Status: DC

## 2012-10-18 MED ORDER — QUETIAPINE FUMARATE ER 50 MG PO TB24: 50.0000 mg | ORAL_TABLET | Freq: Every day | ORAL | Status: DC

## 2012-10-18 MED ORDER — BIOTENE DRY MOUTH MT LIQD: 15.0000 mL | Freq: Four times a day (QID) | OROMUCOSAL | Status: DC

## 2012-10-18 MED ORDER — ACETAMINOPHEN 325 MG PO TABS: 325.0000 mg | ORAL_TABLET | ORAL | Status: AC | PRN

## 2012-10-18 MED ORDER — METOPROLOL TARTRATE 50 MG PO TABS: 50.0000 mg | ORAL_TABLET | Freq: Two times a day (BID) | ORAL | Status: DC

## 2012-10-18 MED ORDER — DEXTROSE 5 % IV SOLN: 2.0000 g | INTRAVENOUS | Status: DC

## 2012-10-18 MED ORDER — SACCHAROMYCES BOULARDII 250 MG PO CAPS: 250.0000 mg | ORAL_CAPSULE | Freq: Two times a day (BID) | ORAL | Status: DC

## 2012-10-18 MED ORDER — ALUM & MAG HYDROXIDE-SIMETH 200-200-20 MG/5ML PO SUSP: 30.0000 mL | ORAL | Status: DC | PRN

## 2012-10-18 NOTE — Progress Notes (Signed)
Patient ID: Charles [name], male   DOB: 10-26-1945, 67 y.o.   MRN: [id number] Subjective/Complaints: 67 year old patient admitted to the rehabilitation service  10/08/12 with severe deconditioning following treatment of a ruptured AAA approximately 3 weeks ago. Hospital course complicated by Salmonella bacteremia and he is completing IV Rocephin. He has a history of LV dysfunction, history of alcohol abuse he continues to have issues with confusion and intermittent paranoia. He remains quite weak but his delirium has improved.  Enclosed bed  "I'd like to go to [facility] facility by [facility]"  Review of Systems  Unable to perform ROS: mental acuity   Objective: Vital Signs: Blood pressure 155/80, pulse 82, temperature 98 F (36.7 C), temperature source Oral, resp. rate 17, height 5\' 7"  (1.702 m), SpO2 97.00%. No results found. No results found for this or any previous visit (from the past 72 hour(s)).   HEENT: poor dentition Cardio: RRR and no murmurs Resp: CTA B/L and breathing unlabored GI: BS positive and nondistended Extremity:  No Edema Skin:   Wound C/D/I Neuro: Alert/Oriented, Confused, Cranial Nerve II-XII normal and Abnormal Motor 4/5 bilateral deltoid, biceps, triceps, grip, hip flexor, knee extensors, ankle dorsiflexor plantar flexor Musc/Skel:  Other atrophy of both upper and lower extremity  Gen. no acute distress   Assessment/Plan: 1. Functional deficits secondary to Deconditioning which require 3+ hours per day of interdisciplinary therapy in a comprehensive inpatient rehab setting. Physiatrist is providing close team supervision and 24 hour management of active medical problems listed below. Physiatrist and rehab team continue to assess barriers to discharge/monitor patient progress toward functional and medical goals. FIM: FIM - Bathing Bathing Steps Patient Completed: Chest;Right Arm;Left Arm;Abdomen;Left upper leg;Right upper leg;Buttocks;Left lower leg (including  foot);Right lower leg (including foot);Front perineal area Bathing: 5: Supervision: Safety issues/verbal cues  FIM - Upper Body Dressing/Undressing Upper body dressing/undressing steps patient completed: Thread/unthread right sleeve of pullover shirt/dresss;Thread/unthread left sleeve of pullover shirt/dress;Put head through opening of pull over shirt/dress;Pull shirt over trunk Upper body dressing/undressing: 5: Set-up assist to: Obtain clothing/put away FIM - Lower Body Dressing/Undressing Lower body dressing/undressing steps patient completed: Thread/unthread right underwear leg;Thread/unthread left underwear leg;Pull underwear up/down;Thread/unthread right pants leg;Thread/unthread left pants leg;Pull pants up/down;Fasten/unfasten left shoe;Fasten/unfasten right shoe;Don/Doff right sock;Fasten/unfasten pants (pt did not want to wear socks) Lower body dressing/undressing: 5: Supervision: Safety issues/verbal cues  FIM - Toileting Toileting steps completed by patient: Adjust clothing prior to toileting;Performs perineal hygiene;Adjust clothing after toileting Toileting Assistive Devices: Grab bar or rail for support Toileting: 5: Supervision: Safety issues/verbal cues  FIM - Diplomatic Services operational officer Devices: Art gallery manager Transfers: 5-To toilet/BSC: Supervision (verbal cues/safety issues);5-From toilet/BSC: Supervision (verbal cues/safety issues)  FIM - Banker Devices: Therapist, occupational: 5: Supine > Sit: Supervision (verbal cues/safety issues);5: Chair or W/C > Bed: Supervision (verbal cues/safety issues);5: Bed > Chair or W/C: Supervision (verbal cues/safety issues)  FIM - Locomotion: Wheelchair Locomotion: Wheelchair: 1: Total Assistance/staff pushes wheelchair (Pt<25%) FIM - Locomotion: Ambulation Locomotion: Ambulation Assistive Devices: Designer, industrial/product Ambulation/Gait Assistance: 5: Supervision Locomotion:  Ambulation: 5: Travels 150 ft or more with supervision/safety issues  Comprehension Comprehension Mode: Auditory Comprehension: 7-Follows complex conversation/direction: With no assist  Expression Expression Mode: Verbal Expression: 7-Expresses complex ideas: With no assist  Social Interaction Social Interaction: 7-Interacts appropriately with others - No medications needed.  Problem Solving Problem Solving: 7-Solves complex problems: Recognizes & self-corrects  Memory Memory: 7-Complete Independence: No helper  Medical Problem List and Plan:  1. DVT Prophylaxis/Anticoagulation: Pharmaceutical: Lovenox  2. Pain Management: Will monitor for symptoms. Denies any problems but used percocet at home per records.  3. Metabolic encephalopathy/Mood: Poor insight/ awareness due to ongoing confusion.  4. Neuropsych: This patient is not capable of making decisions on his own behalf.  5. Salmonella bacteremia: IV antibiotic D# 23/42.  6. Agitation: continue Seroquel at bedtime with prn ativan. May need to titrate for better control.  7. Diarrhea: Likely due to antibiotics. Will add probiotic. Po intake has been poor.  8. ABLA: Will recheck on Monday.  9.  Hypokalemia, has had diarrhea recently.  KCl po bmet improved, monitor  LOS (Days) 10 A FACE TO FACE EVALUATION WAS PERFORMED  [name],[name] 10/18/2012, 7:41 AM

## 2012-10-18 NOTE — Progress Notes (Signed)
Occupational Therapy Session Note  Patient Details  Name: Charles [name] MRN: [id number] Date of Birth: 05-04-1946  Today's Date: 10/18/2012 Time: 0900-1030 Time Calculation (min): 90 min  Short Term Goals: Week 1:  OT Short Term Goal 1 (Week 1): Patient will increase grooming to supervision. OT Short Term Goal 1 - Progress (Week 1): Met OT Short Term Goal 2 (Week 1): Patient will increase UB dressing to Min Assist. OT Short Term Goal 2 - Progress (Week 1): Met OT Short Term Goal 3 (Week 1): Patient will increase LB dressing to Max Assist. OT Short Term Goal 3 - Progress (Week 1): Met OT Short Term Goal 4 (Week 1): Patient will increase toileting to Mod Assist. OT Short Term Goal 4 - Progress (Week 1): Met OT Short Term Goal 5 (Week 1): Patient will increase toilet transfer to Min Assist. OT Short Term Goal 5 - Progress (Week 1): Met Week 2:  OT Short Term Goal 1 (Week 2): Pt will consistenly perform his transfers with supervision using RW demonstrating proper hand placement with min cues.  Skilled Therapeutic Interventions/Progress Updates:    Pt seen for ADL retraining of B/D at sink level (pt choice as he was feeling cold) with a focus on activity tolerance and standing balance. Pt was able to complete all self care with distant supervision standing about 50% of time and sitting in arm chair 50% of time.  He paced himself as needed, taking sit down rest breaks.  He was able to ambulate in and out of bathroom and use toilet with supervision.  He then ambulated to ADL apartment without RW and worked on stepping in and out of tub with grab bars with supervision. He then ambulated to gym to work on UBE with pushing pedals in reverse to work on back extension.  He then ambulated back to room and transferred himself into bed. Pt only needs supervision, but will be going to a SNF tomorrow am to continue his medical care.  Therapy Documentation Precautions:  Precautions Precautions:  Fall Restrictions Weight Bearing Restrictions: No     Pain: Pain Assessment Pain Assessment: No/denies pain Pain Score: 0-No pain ADL:  See FIM for current functional status  Therapy/Group: Individual Therapy  SAGUIER,JULIA 10/18/2012, 11:55 AM

## 2012-10-18 NOTE — Progress Notes (Signed)
Speech Language Pathology Session Note & Discharge Summary  Patient Details  Name: Charles [name] MRN: [id number] Date of Birth: March 15, 1946  Today's Date: 10/18/2012 Time: 1400-1445 Time Calculation (min): 45 min  Skilled Therapeutic Intervention: Treatment focus on cognitive goals. Upon entering the room, the pt was asleep in bed but was easy to arouse and transferred to his wheelchair safely.Pt participated in a newly learned card game with focus on mildly complex problem solving. Pt requires Min question and visual cues to complete task. At end of session, pt demonstrated impulsivity with standing with poor safety awareness and required Mod question cues for emergent awareness.   Patient has met 5 of 7 long term goals.  Patient to discharge at [facility] level.   Reasons goals not met: Pt continues to require Min A verbal and question cues for emergent awareness and functional problem solving with basic and familair tasks.    Clinical Impression/Discharge Summary: Pt has made functional gains and has met 5 of 7 LTG's this admission due to improvements in attention, orientation, working memory with utilization of compensatory strategies and overall swallowing function. Currently, pt is consuming regular textures with thin liquids without overt s/s of aspiration and is overall Mod I for utilization of compensatory strategies. Pt also requires Min verbal and question cues for functional problem solving and emergent awareness with functional and familiar tasks. Pt's family is unable to provide the necessary assistance needed at this time, therefore, pt will discharge to a SNF for continued SLP intervention to  maximize cognitive function and overall functional independence to decrease caregiver burden.   Care Partner:  Caregiver Able to Provide Assistance: No  Type of Caregiver Assistance: Physical;Cognitive  Recommendation:  Skilled Nursing facility  Rationale for SLP Follow Up: Maximize  cognitive function and independence;Reduce caregiver burden;Maximize functional communication   Equipment: N/A   Reasons for discharge: Discharged from hospital   Patient/Family Agrees with Progress Made and Goals Achieved: Yes   See FIM for current functional status  [name] 10/18/2012, 4:18 PM

## 2012-10-18 NOTE — Progress Notes (Addendum)
[facility]  Subjective  - good functional progress with verbal ques.  Awaiting discharge to SNF.     Objective 155/80 82 98 F (36.7 C) (Oral) 17 97%  Intake/Output Summary (Last 24 hours) at 10/18/12 0924 Last data filed at 10/18/12 0900  Gross per 24 hour  Intake    340 ml  Output    500 ml  Net   -160 ml   Well healed abdominal incision. Palpable radial, femoral, DP bilateral. Active range of motion of all four extremities.    Assessment/Planning: Open AAA and  Right Fem-pop Pending SNF palcement.  Charles [name] [facility] 10/18/2012 9:24 AM --  Laboratory Lab Results: No results found for this basename: WBC, HGB, HCT, PLT,  in the last 72 hours BMET No results found for this basename: NA, K, CL, CO2, GLUCOSE, BUN, CREATININE, CALCIUM,  in the last 72 hours  COAG Lab Results  Component Value Date   INR 1.16 09/30/2012   INR 1.07 09/29/2012   INR 1.11 09/28/2012   No results found for this basename: PTT

## 2012-10-18 NOTE — Progress Notes (Addendum)
Physical Therapy Discharge Summary  Patient Details  Name: Charles [name] MRN: [id number] Date of Birth: [birth year]  Today's Date: 10/18/2012 Time: 0800-0900 Time Calculation (min): 60 min  Patient has met 12 of 13 long term goals due to improved activity tolerance, improved balance, improved postural control, increased strength, increased range of motion, ability to compensate for deficits, functional use of  right lower extremity and left lower extremity, improved attention and improved awareness.  Patient to discharge at an ambulatory level Supervision.   Patient's care partner unavailable to provide the necessary cognitive assistance at discharge; pt discharging to SNF.  Reasons goals not met: car transfer not addressed as pt discharged early to SNF  Recommendation:  Patient will benefit from ongoing skilled PT services in skilled nursing facility setting to continue to advance safe functional mobility, address ongoing impairments in activity tolerance, muscle strength and endurance, motor control, balance, functional mobility, and minimize fall risk.  Equipment: No equipment provided  Reasons for discharge: discharge from hospital  Patient/family agrees with progress made and goals achieved: Yes  PT Discharge Precautions/Restrictions Precautions Precautions: Fall Restrictions Weight Bearing Restrictions: No Pain Pain Assessment Pain Score:   5 Pain Type: Chronic pain Pain Location: Back Pain Intervention(s): Medication (See eMAR) Vision/Perception  Vision - History Baseline Vision: Wears glasses only for reading Patient Visual Report: No change from baseline Perception Perception: Within Functional Limits Praxis Praxis: Intact  Cognition Overall Cognitive Status: Within Functional Limits for tasks assessed Arousal/Alertness: Awake/alert Orientation Level: Oriented X4 Memory: Impaired; pt needs min cues to remember strengthening exercises or therapist's  name Awareness: Impaired; pt needs min cues to identify his physical weaknesses  Awareness Impairment: Emergent impairment; pt is able to ambulate through obstacle course, pick up objects from floor, perform floor transfer after 1 demo, but needs 1 cue to lock w/c brakes at times before standing Sensation Sensation Light Touch: Appears Intact Stereognosis: Appears Intact Hot/Cold: Appears Intact Proprioception: Appears Intact Coordination Gross Motor Movements are Fluid and Coordinated: Yes Fine Motor Movements are Fluid and Coordinated: No Coordination and Movement Description: pt has bilateral UE tremors but he is able to use his hands functionally Heel Shin Test: impaired excursion, speed and accuracy Motor  Motor Motor: Within Functional Limits Motor - Discharge Observations: tremors  Mobility Bed Mobility Bed Mobility:  (modified independent  for all) Transfers Sit to Stand: 5: Supervision Stand to Sit: 5: Supervision Stand Pivot Transfers: 5: Supervision floor transfer with supervision Locomotion  Ambulation Ambulation: Yes Ambulation/Gait Assistance: 5: Supervision Assistive device: Rolling walker (or without AD) x 150' in community setting, controlled setting, x 50' in home setting Ambulation/Gait Assistance Details: Verbal cues for technique;Verbal cues for safe use of DME/AE Gait Gait: Yes Gait Pattern: Impaired Gait Pattern: Trunk flexed;Narrow base of support;Decreased trunk rotation;Decreased hip/knee flexion - right;Decreased hip/knee flexion - left;Decreased dorsiflexion - right;Decreased dorsiflexion - left Stairs / Additional Locomotion Stairs: Yes Stairs Assistance: 5: Supervision Stair Management Technique: Two rails;Alternating pattern Number of Stairs: 10 Height of Stairs: 7 Wheelchair Mobility Wheelchair Mobility: Yes Wheelchair Assistance: 5: Investment banker, operational: Both upper extremities Wheelchair Parts Management: Needs  assistance Distance: 150  Trunk/Postural Assessment  Cervical Assessment Cervical Assessment: Within Functional Limits Thoracic Assessment Thoracic Assessment:  (kyphotic posture) Postural Control Postural Control: Within Functional Limits  Balance Balance Balance Assessed: Yes Static Sitting Balance Static Sitting - Level of Assistance: 7: Independent Static Standing Balance Static Standing - Level of Assistance: 5: Stand by assistance Dynamic Standing Balance Dynamic Standing - Balance Activities:  Reaching for objects;Reaching across midline Berg balance score on  10/12/12 = 49/56  Extremity Assessment  RUE Assessment RUE Assessment: Within Functional Limits LUE Assessment LUE Assessment: Within Functional Limits RLE Assessment RLE Assessment: Within Functional Limits; tight heel cord and hamstring LLE Assessment LLE Assessment: Within Functional Limits; tight heel cord and hamstring  See FIM for current functional status  Charles [name] 10/18/2012, 3:19 PM

## 2012-10-18 NOTE — Progress Notes (Signed)
Social Work Patient ID: Charles [name], male   DOB: 1945/07/02, 67 y.o.   MRN: [id number] Met with pt to inform [facility] has offered a bed for him tomorrow.  Have contacted son to inform and he will contact facility and schedule time to do paperwork. Plan on transfer tomorrow, pt and son aware can be transported via car, no need for ambulance.  Both agreeable to plan and pleased with place.  Work on Estate manager/land agent.

## 2012-10-18 NOTE — Progress Notes (Signed)
Physical Therapy Session Note  Patient Details  Name: Charles [name] MRN: [id number] Date of Birth: [birth year]  Today's Date: 10/18/2012 Time:  -     Short Term Goals: Week 1:  PT Short Term Goal 1 (Week 1): Same as LTGs Week 2:     Skilled Therapeutic Interventions/Progress Updates:      Therapy Documentation Precautions:  Precautions Precautions: Fall Restrictions Weight Bearing Restrictions: No General:   Vital Signs: Therapy Vitals Temp: 98 F (36.7 C) Temp src: Oral Pulse Rate: 82 Resp: 17 BP: 155/80 mmHg Patient Position, if appropriate: Lying Oxygen Therapy SpO2: 97 % O2 Device: None (Room air) Pain:   Mobility:   Locomotion :    Trunk/Postural Assessment :    Balance:   Exercises:   Other Treatments:    See FIM for current functional status  Therapy/Group: Individual Therapy  [name] 10/18/2012, 7:50 AM

## 2012-10-18 NOTE — Progress Notes (Signed)
Speech Language Pathology Weekly Progress Note  Patient Details  Name: Charles [name] MRN: [id number] Date of Birth: 1945-09-27  Today's Date: 10/18/2012  Short Term Goals: Week 1: SLP Short Term Goal 1 (Week 1): Patient will consume regular textures and thin liquids with no overt s/s of aspiration and Supervision vebral cues for small bites and sips. SLP Short Term Goal 1 - Progress (Week 1): Met SLP Short Term Goal 2 (Week 1): Patient will verbalize orientation with Mod assist question cues. SLP Short Term Goal 2 - Progress (Week 1): Met SLP Short Term Goal 3 (Week 1): Patient will request help as needed with call bell with Max assist verbal cues. SLP Short Term Goal 3 - Progress (Week 1): Met SLP Short Term Goal 4 (Week 1): Patient sustain attention to basic, familiar task for 5 minutes with Mod assist cues for re-direction. SLP Short Term Goal 4 - Progress (Week 1): Met SLP Short Term Goal 5 (Week 1): Patient willl increase speech intelligibility at the phrase level with Min assist verbal cues. SLP Short Term Goal 5 - Progress (Week 1): Met Week 2: SLP Short Term Goal 1 (Week 2): Patient will verbalize orientation with Min assist question cues. SLP Short Term Goal 2 (Week 2): Patient will demonstrate basic problem solving with Supervision level verbal cues. SLP Short Term Goal 3 (Week 2): Patient select attention to basic, familiar task for 10 minutes with Min assist cues for re-direction. SLP Short Term Goal 4 (Week 2): Patient willl increase speech intelligibility at the sentence level with Min assist verbal cues. SLP Short Term Goal 5 (Week 2): Patient will demonstrate recall/carryover of daily information with Mod assist cues to utilize external aids.  Weekly Progress Updates: Patient met 5 out of 5 short term goals this reporting period due to gains in participation, sustained attention, orientation and speech intelligibility.  Patient continues to demonstrate significantly impaired  recall of daily information and decreased problem solving which limits his ability to safely perform basic ADLs and care for himself.  As a result, it is recommended that this patient continue to receive skilled SLP services to address these deficits, maximize functional independence and reduce burden of care prior to discharge home with 24/7 care.    SLP Intensity: Minumum of 1-2 x/day, 30 to 90 minutes SLP Frequency: 5 out of 7 days SLP Duration/Estimated Length of Stay: 1 week SLP Treatment/Interventions: Cognitive remediation/compensation;Cueing hierarchy;Environmental controls;Functional tasks;Internal/external aids;Medication managment;Patient/family education;Therapeutic Activities  Charlane Ferretti., CCC-SLP [phone]  Lakeisha Waldrop 10/18/2012, 9:46 AM

## 2012-10-18 NOTE — Progress Notes (Signed)
Occupational Therapy Discharge Summary  Patient Details  Name: Charles [name] MRN: [id number] Date of Birth: 1945-06-14  Today's Date: 10/18/2012   Patient has met 8 of 8 long term goals due to improved activity tolerance, improved balance, improved attention, improved awareness and improved coordination.  Patient to discharge at overall Supervision level.  Patient's care partner unavailable to provide the necessary physical assistance at discharge.    Reasons goals not met: n/a  Recommendation:  Patient will benefit from ongoing skilled OT services in skilled nursing facility setting to continue to advance functional skills in the area of BADL and iADL.  Equipment: No equipment provided  Reasons for discharge: treatment goals met  Patient/family agrees with progress made and goals achieved: Yes  OT Discharge ADL  supervision level Vision/Perception  Vision - History Baseline Vision: Wears glasses only for reading Patient Visual Report: No change from baseline Perception Perception: Within Functional Limits Praxis Praxis: Intact  Cognition Overall Cognitive Status: Within Functional Limits for tasks assessed Orientation Level: Oriented to person;Oriented to place;Oriented to situation Sensation Sensation Light Touch: Appears Intact Stereognosis: Appears Intact Hot/Cold: Appears Intact Proprioception: Appears Intact Coordination Gross Motor Movements are Fluid and Coordinated: Yes Fine Motor Movements are Fluid and Coordinated: No Coordination and Movement Description: pt has bilateral UE tremors but he is able to use his hands functionally Motor  Motor Motor: Within Functional Limits Mobility    supervision level Trunk/Postural Assessment  Cervical Assessment Cervical Assessment: Within Functional Limits Thoracic Assessment Thoracic Assessment:  (kyphotic posture) Postural Control Postural Control: Within Functional Limits  Balance Static Sitting  Balance Static Sitting - Level of Assistance: 7: Independent Static Standing Balance Static Standing - Level of Assistance: 5: Stand by assistance Extremity/Trunk Assessment RUE Assessment RUE Assessment: Within Functional Limits LUE Assessment LUE Assessment: Within Functional Limits  See FIM for current functional status  SAGUIER,JULIA 10/18/2012, 12:03 PM

## 2012-10-18 NOTE — Discharge Summary (Signed)
Physician Discharge Summary  Patient ID: Charles [name] MRN: [id number] DOB/AGE: 1945-10-24 67 y.o.  Admit date: 10/08/2012 Discharge date: 10/19/2012  Discharge Diagnoses:  Principal Problem:   Encephalopathy, metabolic Active Problems:   AAA (abdominal aortic aneurysm, ruptured)   Salmonella bacteremia   Acute post-hemorrhagic anemia   Discharged Condition: Good.   Labs:  Basic Metabolic Panel:  Recent Labs Lab 10/12/12 0833 10/14/12 0550 10/15/12 0650  NA 136 138 137  K 4.9 4.1 4.1  CL 105 105 104  CO2 23 23 26   GLUCOSE 118* 94 91  BUN 14 13 14   CREATININE 0.65 0.62 0.70  CALCIUM 8.2* 8.1* 8.2*    CBC: No results found for this basename: WBC, NEUTROABS, HGB, HCT, MCV, PLT,  in the last 168 hours  CBG: No results found for this basename: GLUCAP,  in the last 168 hours   Filed Vitals:   10/19/12 0641  BP: 138/73  Pulse: 65  Temp: 98.8 F (37.1 C)  Resp: 18   Brief HPI:   Charles [name] is a 67 y.o. male with two month history of abdominal pain, low back pain with LLE weakness with problems walking and difficulty with oral intake. He was admitted via [facility] on 09/22/12 for treatment of ruptured AAA with hemorrhage extending into left iliopsoas muscle. He was taken to OR emergently for AAA repair with left common iliac to right common femoral bypass, aortic endarterectomy, right femoral endarterectomy profundaplasty, placement abdominal VAC. Placed on IV zosyn. Abdominal wound closure done on 04/25. BC/UCS positive for salmonella and ID consulted for input on treatment course. Dr. 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 Metabolic encephalopathy resolving but he required multiple cues for basic tasks and was deconditioned therefore therapy team recommended CIR for further therapies.    Hospital Course: [name] was admitted to rehab 10/08/2012 for inpatient therapies to consist  of PT, ST and OT at least three hours five days a week. Past admission physiatrist, therapy team and rehab RN have worked together to provide customized collaborative inpatient rehab. He had confusion with agitation and sleep wake disruption at time of admission. He was started on Seroquel and with time delirium has resolved. Antibiotic associated diarrhea was treated with probiotic and has resolved. Hypokalemia due to diarrhea was supplemented and recent check show K+ stable at 4.1. Follow up CBC past admission showed H/H improved to 9.6 and WBC stable at 6.5.  Po intake has been good. He is continent of bowel and bladder. He continues to have BUE tremors but is able to use hands functionally. His abdominal and LE wounds have been monitored and have been healing well without signs or symptoms of infection. He is to continue on IV rocephin through June 3rd. He's set up for follow up with Dr. Ninetta Lights who will provide input on further antibiotic therapy.    Rehab course: During patient's stay in rehab weekly team conferences were held to monitor patient's progress, set goals and discuss barriers to discharge. Speech therapy has worked on cognition with focus on improving sustained attention, orientation and speech intelligibility. Patient continues to demonstrate significantly impaired recall of daily information as well as decreased problem solving for basic tasks. This limits his ability to safely perform basic ADLs, manage finances or medications and care for himself. Occupational therapy has worked with patient on ADL tasks. He requires supervision for bathing and dressing.  Physical therapy has worked  with patient mobility and strengthening.  He has had improvement in activity tolerance, balance, and postural control. He is showing increased strength, increased range of motion, as well as improved attention and improved awareness. Patient requires supervision for transfers and mobility.  Family is unable to  provide care needed and patient has elected on SNF for further therapies. Patient is discharged to [facility] on 10/19/12   Disposition: Skilled Nursing Facility.    Diet:  Regular   Special Instructions: 1. Needs set up assist and intermittent supervision at meals.  2.  PT, OT, ST to continue past discharge.        Future Appointments Provider Department Dept Phone   11/02/2012 9:00 AM Ginnie Smart, MD [facility] [phone]   11/08/2012 11:30 AM [facility]rick Colace, MD [facility] [phone]   11/11/2012 2:00 PM [facility] [phone]   11/11/2012 2:30 PM Sherren Kerns, MD [facility] [phone]       Medication List    STOP taking these medications       oxyCODONE-acetaminophen 5-325 MG per tablet  Commonly known as:  PERCOCET/ROXICET      TAKE these medications       acetaminophen 325 MG tablet  Commonly known as:  TYLENOL  Take 1-2 tablets (325-650 mg total) by mouth every 4 (four) hours as needed.     alum & mag hydroxide-simeth 200-200-20 MG/5ML suspension  Commonly known as:  MAALOX/MYLANTA  Take 30 mLs by mouth every 4 (four) hours as needed for indigestion.     antiseptic oral rinse Liqd  15 mLs by Mouth Rinse route QID.     dextrose 5 % SOLN 50 mL with cefTRIAXone 2 G SOLR 2 g  Inject 2 g into the vein daily. To continue through June 3rd.     metoprolol 50 MG tablet  Commonly known as:  LOPRESSOR  Take 1 tablet (50 mg total) by mouth 2 (two) times daily.     multivitamin with minerals Tabs  Take 1 tablet by mouth daily.     QUEtiapine 50 MG Tb24  Commonly known as:  SEROQUEL XR  Take 1 tablet (50 mg total) by mouth at bedtime.     saccharomyces boulardii 250 MG capsule  Commonly known as:  FLORASTOR  Take 1 capsule (250 mg total) by mouth 2 (two) times daily.       Follow-up Information   Follow up with  Sherren Kerns, MD On 11/11/2012. (Follow up appointment at  2 pm)    Contact information:   [address] [place] [address] [phone]       Follow up with Erick Colace, MD On 11/08/2012. (Be there at 11:15 for 11:30 appointment)    Contact information:   [address] [place] [address] [phone]       Follow up with Johny Sax, MD On 10/18/2012. (Appointment at 9 am. )    Contact information:   [address]  [address] [place] [address] [phone]       Signed: Jacquelynn Cree 10/19/2012, 7:52 AM

## 2012-10-19 MED ORDER — SODIUM CHLORIDE 0.9 % IJ SOLN: 10.0000 mL | INTRAMUSCULAR | Status: DC | PRN

## 2012-10-19 NOTE — Plan of Care (Signed)
Problem: RH KNOWLEDGE DEFICIT Goal: RH STG INCREASE KNOWLEDGE OF HYPERTENSION Pt will verbalize how to manage BP at home, how to monitor BP, and medication management.  Outcome: Not Met (add Reason) Patient could not verbalize these, discharged to [facility]

## 2012-10-19 NOTE — Plan of Care (Signed)
Problem: RH BOWEL ELIMINATION Goal: RH STG MANAGE BOWEL WITH ASSISTANCE STG Manage Bowel with supervision.  Outcome: Completed/Met Date Met:  10/19/12 Per report patient continent, LBM 5/19

## 2012-10-19 NOTE — Progress Notes (Signed)
Peripherally Inserted Central Catheter/Midline Placement  The IV Nurse has discussed with the patient and/or persons authorized to consent for the patient, the purpose of this procedure and the potential benefits and risks involved with this procedure.  The benefits include less needle sticks, lab draws from the catheter and patient may be discharged home with the catheter.  Risks include, but not limited to, infection, bleeding, blood clot (thrombus formation), and puncture of an artery; nerve damage and irregular heat beat.  Alternatives to this procedure were also discussed.  PICC/Midline Placement Documentation        Lisabeth Devoid 10/19/2012, 11:01 AM Consent obtained by Stacie Glaze, RN

## 2012-10-19 NOTE — Progress Notes (Signed)
Social Work Discharge Note Discharge Note  The overall goal for the admission was met for:   Discharge location: [facility]  Length of Stay: Yes-11 DAYS  Discharge activity level: Yes-SUPERVISION/MIN LEVEL  Home/community participation: Yes  Services provided included: MD, RD, PT, OT, SLP, RN, Pharmacy, Neuropsych and SW  Financial Services: Medicare and Medicaid  Follow-up services arranged: Other: NHP  Comments (or additional information):SHORT TERM NH GET STRONGER AND FINISH ANTIBIOTICS  Patient/Family verbalized understanding of follow-up arrangements: Yes  Individual responsible for coordination of the follow-up plan: SELF & Bryer-SON  Confirmed correct DME delivered: Lucy Chris 10/19/2012    Lucy Chris

## 2012-10-19 NOTE — Progress Notes (Signed)
Patient ID: Charles [name], male   DOB: May 05, 1946, 67 y.o.   MRN: [id number] Subjective/Complaints: 67 year old patient admitted to the rehabilitation service  10/08/12 with severe deconditioning following treatment of a ruptured AAA approximately 3 weeks ago. Hospital course complicated by Salmonella bacteremia and he is completing IV Rocephin. He has a history of LV dysfunction, history of alcohol abuse he continues to have issues with confusion and intermittent paranoia. He remains quite weak but his delirium has improved.    I heard I was leaving today  Review of Systems  Unable to perform ROS: mental acuity   Objective: Vital Signs: Blood pressure 138/73, pulse 65, temperature 98.8 F (37.1 C), temperature source Oral, resp. rate 18, height 5\' 7"  (1.702 m), SpO2 98.00%. No results found. No results found for this or any previous visit (from the past 72 hour(s)).   HEENT: poor dentition Cardio: RRR and no murmurs Resp: CTA B/L and breathing unlabored GI: BS positive and nondistended Extremity:  No Edema Skin:   Wound C/D/I Neuro: Alert/Oriented, Confused, Cranial Nerve II-XII normal and Abnormal Motor 4/5 bilateral deltoid, biceps, triceps, grip, hip flexor, knee extensors, ankle dorsiflexor plantar flexor Musc/Skel:  Other atrophy of both upper and lower extremity  Gen. no acute distress   Assessment/Plan: 1. Functional deficits secondary to Deconditioning which require 3+ hours per day of interdisciplinary Stable for D/C to SNF FIM: FIM - Bathing Bathing Steps Patient Completed: Chest;Right Arm;Left Arm;Abdomen;Left upper leg;Right upper leg;Buttocks;Left lower leg (including foot);Right lower leg (including foot);Front perineal area Bathing: 5: Supervision: Safety issues/verbal cues  FIM - Upper Body Dressing/Undressing Upper body dressing/undressing steps patient completed: Thread/unthread right sleeve of pullover shirt/dresss;Thread/unthread left sleeve of pullover  shirt/dress;Put head through opening of pull over shirt/dress;Pull shirt over trunk Upper body dressing/undressing: 5: Set-up assist to: Obtain clothing/put away FIM - Lower Body Dressing/Undressing Lower body dressing/undressing steps patient completed: Thread/unthread right underwear leg;Thread/unthread left underwear leg;Pull underwear up/down;Thread/unthread right pants leg;Thread/unthread left pants leg;Pull pants up/down;Don/Doff right sock;Don/Doff left sock;Don/Doff right shoe;Don/Doff left shoe;Fasten/unfasten right shoe;Fasten/unfasten left shoe Lower body dressing/undressing: 5: Supervision: Safety issues/verbal cues  FIM - Toileting Toileting steps completed by patient: Adjust clothing prior to toileting;Performs perineal hygiene;Adjust clothing after toileting Toileting Assistive Devices: Grab bar or rail for support Toileting: 5: Supervision: Safety issues/verbal cues  FIM - Diplomatic Services operational officer Devices: Art gallery manager Transfers: 5-To toilet/BSC: Supervision (verbal cues/safety issues);5-From toilet/BSC: Supervision (verbal cues/safety issues)  FIM - Banker Devices: Therapist, occupational: 5: Chair or W/C > Bed: Supervision (verbal cues/safety issues);5: Bed > Chair or W/C: Supervision (verbal cues/safety issues)  FIM - Locomotion: Wheelchair Distance: 150 Locomotion: Wheelchair: 2: Travels 50 - 149 ft with supervision, cueing or coaxing FIM - Locomotion: Ambulation Locomotion: Ambulation Assistive Devices: Designer, industrial/product Ambulation/Gait Assistance: 5: Supervision Locomotion: Ambulation: 5: Travels 150 ft or more with supervision/safety issues  Comprehension Comprehension Mode: Auditory Comprehension: 5-Understands basic 90% of the time/requires cueing < 10% of the time  Expression Expression Mode: Verbal Expression: 5-Expresses basic 90% of the time/requires cueing < 10% of the time.  Social  Interaction Social Interaction: 5-Interacts appropriately 90% of the time - Needs monitoring or encouragement for participation or interaction.  Problem Solving Problem Solving: 5-Solves basic 90% of the time/requires cueing < 10% of the time  Memory Memory: 4-Recognizes or recalls 75 - 89% of the time/requires cueing 10 - 24% of the time  Medical Problem List and Plan:  1. DVT Prophylaxis/Anticoagulation: Pharmaceutical: Lovenox  2. Pain Management: Will monitor for symptoms. Denies any problems but used percocet at home per records.  3. Metabolic encephalopathy/Mood: Poor insight/ awareness due to ongoing confusion.  4. Neuropsych: This patient is not capable of making decisions on his own behalf.  5. Salmonella bacteremia: IV antibiotic D# 24/42.  6. Agitation: continue Seroquel at bedtime with prn ativan. May need to titrate for better control.  7. Diarrhea: Likely due to antibiotics. Will add probiotic. Po intake has been poor.  8. ABLA: Will recheck on Monday.  9.  Hypokalemia, has had diarrhea recently.  KCl po bmet improved, monitor  LOS (Days) 11 A FACE TO FACE EVALUATION WAS PERFORMED  [name],[name] 10/19/2012, 7:58 AM

## 2012-10-20 ENCOUNTER — Inpatient Hospital Stay: Payer: Medicare Other | Admitting: Internal Medicine

## 2012-11-02 ENCOUNTER — Ambulatory Visit (INDEPENDENT_AMBULATORY_CARE_PROVIDER_SITE_OTHER): Payer: Medicare Other | Admitting: Infectious Diseases

## 2012-11-02 ENCOUNTER — Encounter: Payer: Self-pay | Admitting: Infectious Diseases

## 2012-11-02 VITALS — BP 146/73 | HR 70 | Temp 97.8°F | Ht 64.0 in | Wt 100.0 lb

## 2012-11-02 DIAGNOSIS — R7881 Bacteremia: Secondary | ICD-10-CM

## 2012-11-02 DIAGNOSIS — I713 Abdominal aortic aneurysm, ruptured: Secondary | ICD-10-CM

## 2012-11-02 MED ORDER — CIPROFLOXACIN HCL 500 MG PO TABS: 500.0000 mg | ORAL_TABLET | Freq: Two times a day (BID) | ORAL | Status: DC

## 2012-11-02 NOTE — Assessment & Plan Note (Signed)
He appears to be doing well. Will get him f/u appt with Dr Darrick Penna.

## 2012-11-02 NOTE — Progress Notes (Signed)
  Subjective:    Patient ID: Charles [name], male    DOB: 10-19-1945, 67 y.o.   MRN: [id number]  HPI 67 y.o. male. With presentation to [facility] ED with Complaints of mouth pain, abdominal and low back pain for 2 months. Pt gradually endrosed abdominal pain and low back pain which been present for 2 months since a fall. He has been taking narcotics without relief. Since his fall he has had some difficulty ambulating and has been using a walker. He has had problems with left leg weakness for 2 months . He had increasing difficulty caring for himself and is no longer cooking for herself. His oral intake has been minimal and he has been losing weight over the last several weeks or longer.  CT of the abdomen showed: Rupturing abdominal aortic aneurysm. nto the left psoas muscle with the hemorrhage extending down the left psoas muscle into the left iliopsoas muscle.  He was transferred to [facility] for emergency surgery and taken to the OR and underwent Aorto left common iliac right common femoral bypass with 16x8 mm dacron graft left limb to common iliac, right limb to common femoral, IMA ligated, right internal and external iliac ligated along aortic endarterectomy, right femoral endarterectomy profundaplasty,  The patient had admission urine and blood cultures 2/2 positive for Salmonella. He had f/u BCx on 4-29 (on anbx) which were (-).  He was treated with ceftriaxone and did well. He did need R aorto --> below knee popliteal bypass 09-30-12. He was able to be transferred to rehab and then to SNF on 5-19.  Today he denies abd pain. Wounds have healed well. He has some pain and swelling in his R calf. Denies fever or chills.  He continues on ceftriaxone (today is day 42).  Continues to smoke 1/2 ppd.  No problems with PIC line.   Review of Systems  Constitutional: Negative for fever, chills, appetite change and unexpected weight change.  Gastrointestinal: Negative for diarrhea and constipation.  Genitourinary:  Negative for difficulty urinating.       Objective:   Physical Exam  Constitutional: He appears well-developed and well-nourished.  HENT:  Mouth/Throat: Abnormal dentition. Dental caries present. No oropharyngeal exudate.  Eyes: EOM are normal. Pupils are equal, round, and reactive to light.  Neck: Neck supple.  Cardiovascular: Normal rate, regular rhythm and normal heart sounds.   Pulmonary/Chest: Effort normal and breath sounds normal.  Abdominal: Soft. Bowel sounds are normal. There is no tenderness.    Musculoskeletal:       Arms:      Legs: Lymphadenopathy:    He has no cervical adenopathy.          Assessment & Plan:

## 2012-11-03 ENCOUNTER — Other Ambulatory Visit: Payer: Self-pay | Admitting: Infectious Diseases

## 2012-11-03 ENCOUNTER — Other Ambulatory Visit (INDEPENDENT_AMBULATORY_CARE_PROVIDER_SITE_OTHER): Payer: Medicare Other

## 2012-11-03 DIAGNOSIS — R7881 Bacteremia: Secondary | ICD-10-CM

## 2012-11-04 ENCOUNTER — Other Ambulatory Visit: Payer: Self-pay | Admitting: *Deleted

## 2012-11-04 DIAGNOSIS — Z48812 Encounter for surgical aftercare following surgery on the circulatory system: Secondary | ICD-10-CM

## 2012-11-04 DIAGNOSIS — I739 Peripheral vascular disease, unspecified: Secondary | ICD-10-CM

## 2012-11-08 ENCOUNTER — Encounter: Payer: Self-pay | Admitting: Physical Medicine & Rehabilitation

## 2012-11-08 ENCOUNTER — Ambulatory Visit (HOSPITAL_BASED_OUTPATIENT_CLINIC_OR_DEPARTMENT_OTHER): Payer: Medicare Other | Admitting: Physical Medicine & Rehabilitation

## 2012-11-08 ENCOUNTER — Encounter: Payer: Medicare Other | Attending: Physical Medicine & Rehabilitation

## 2012-11-08 VITALS — BP 152/80 | HR 76 | Resp 14 | Ht 64.0 in | Wt 100.8 lb

## 2012-11-08 DIAGNOSIS — M549 Dorsalgia, unspecified: Secondary | ICD-10-CM | POA: Insufficient documentation

## 2012-11-08 DIAGNOSIS — R5381 Other malaise: Secondary | ICD-10-CM

## 2012-11-08 DIAGNOSIS — R5383 Other fatigue: Secondary | ICD-10-CM

## 2012-11-08 DIAGNOSIS — R1310 Dysphagia, unspecified: Secondary | ICD-10-CM | POA: Insufficient documentation

## 2012-11-08 DIAGNOSIS — IMO0001 Reserved for inherently not codable concepts without codable children: Secondary | ICD-10-CM | POA: Insufficient documentation

## 2012-11-08 DIAGNOSIS — R531 Weakness: Secondary | ICD-10-CM

## 2012-11-08 DIAGNOSIS — R109 Unspecified abdominal pain: Secondary | ICD-10-CM | POA: Insufficient documentation

## 2012-11-08 DIAGNOSIS — R262 Difficulty in walking, not elsewhere classified: Secondary | ICD-10-CM | POA: Insufficient documentation

## 2012-11-08 NOTE — Patient Instructions (Addendum)
Written instructions on skilled nursing home forms

## 2012-11-08 NOTE — Progress Notes (Signed)
Subjective:    Patient ID: Charles [name], male    DOB: 1946-05-27, 67 y.o.   MRN: [id number]  HPI Charles [name] is a 67 y.o. male with two month history of abdominal pain, low back pain with LLE weakness with problems walking and difficulty with oral intake. He was admitted via [facility] on 09/22/12 for treatment of ruptured AAA with hemorrhage extending into left iliopsoas muscle. He was taken to OR emergently for AAA repair with left common iliac to right common femoral bypass, aortic endarterectomy, right femoral endarterectomy profundaplasty, placement abdominal VAC. Placed on IV zosyn. Abdominal wound closure done on 04/25. BC/UCS positive for salmonella and ID consulted for input on treatment course. Dr. 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  Has been at a skilled nursing facility. Has finished IV antibiotics and is now on oral antibiotics. Has followed up with infectious disease specialist. Has an appointment for the vein and vascular surgeon who did his aortic aneurysm repair.  Plans to discharge to home this Friday where his nephew will help him.  Pain Inventory Average Pain 5 Pain Right Now 5 My pain is aching  In the last 24 hours, has pain interfered with the following? General activity 0 Relation with others 0 Enjoyment of life 0 What TIME of day is your pain at its worst? all Sleep (in general) Poor  Pain is worse with: walking, bending, sitting and inactivity Pain improves with: rest, therapy/exercise and medication Relief from Meds: 0  Mobility walk without assistance how many minutes can you walk? 20-30 ability to climb steps?  yes  Function disabled: date disabled .  Neuro/Psych No problems in this area  Prior Studies Any changes since last visit?  no  Physicians involved in your care Any changes since last visit?  no   Family History  Problem Relation Age of Onset  .  Diabetes Mother   . Diabetes Father    History   Social History  . Marital Status: Single    Spouse Name: N/A    Number of Children: N/A  . Years of Education: N/A   Social History Main Topics  . Smoking status: Current Every Day Smoker -- 0.30 packs/day    Types: Cigarettes  . Smokeless tobacco: Never Used  . Alcohol Use: Yes     Comment: former  . Drug Use: No  . Sexually Active: None   Other Topics Concern  . None   Social History Narrative  . None   Past Surgical History  Procedure Laterality Date  . Hernia repair    . Pleural scarification      collapsed lung  . Abdominal aortic aneurysm repair N/A 09/21/2012    Procedure: ANEURYSM ABDOMINAL AORTIC REPAIR;  Surgeon: Sherren Kerns, MD;  Location: [facility] OR;  Service: Vascular;  Laterality: N/A;  . Patch angioplasty  09/21/2012    Procedure: PATCH ANGIOPLASTY;  Surgeon: Sherren Kerns, MD;  Location: [facility] OR;  Service: Vascular;;  Hemashield Patch Angioplasty fo Right Common Femoral Artery  . Embolectomy Right 09/21/2012    Procedure: EMBOLECTOMY;  Surgeon: Sherren Kerns, MD;  Location: [facility] OR;  Service: Vascular;  Laterality: Right;  Embolectomy of right leg.  . Wound debridement  09/24/2012    Procedure: Removal of Abdominal wound VAC, and Abdominal Closure;  Surgeon: Sherren Kerns, MD;  Location: [facility] OR;  Service: Vascular;;  . Femoral-popliteal bypass graft Right 09/30/2012  Procedure: BYPASS GRAFT FEMORAL-POPLITEAL ARTERY;  Surgeon: Fransisco Hertz, MD;  Location: [facility] OR;  Service: Vascular;  Laterality: Right;   Past Medical History  Diagnosis Date  . Hypercholesteremia   . Collapsed lung     s/p fall, rib fracture   BP 152/80  Pulse 76  Resp 14  Ht 5\' 4"  (1.626 m)  Wt 100 lb 12.8 oz (45.723 kg)  BMI 17.29 kg/m2  SpO2 96%    Review of Systems  All other systems reviewed and are negative.       Objective:   Physical Exam  4/5 strength in bilateral deltoid, biceps, triceps, grip, hip flexor, knee  extensors, ankle dorsi and plantar flexion Ambulates modified independent without device no evidence of toe drag or knee instability Abdomen midline incision well healed Extremity appears to have fasciotomy defect right medial calf Back range of motion 75% flexion extension lateral  bending      Assessment & Plan:  1. Deconditionin improving after inpatient relocation as well skilled nursing rehabilitation. He has some muscle aches with reconditioning. Using just Tylenol. Recommend tramadol to 2 mg every 8 hours x1 week . Will followup with vascular surgery as well as infectious disease  RTC when necessary

## 2012-11-09 LAB — CULTURE, BLOOD (SINGLE)
Organism ID, Bacteria: NO GROWTH
Organism ID, Bacteria: NO GROWTH

## 2012-11-10 ENCOUNTER — Encounter: Payer: Self-pay | Admitting: Vascular Surgery

## 2012-11-11 ENCOUNTER — Encounter: Payer: Self-pay | Admitting: Vascular Surgery

## 2012-11-11 ENCOUNTER — Other Ambulatory Visit: Payer: Self-pay | Admitting: *Deleted

## 2012-11-11 ENCOUNTER — Ambulatory Visit (INDEPENDENT_AMBULATORY_CARE_PROVIDER_SITE_OTHER): Payer: Medicare Other | Admitting: Vascular Surgery

## 2012-11-11 ENCOUNTER — Encounter (INDEPENDENT_AMBULATORY_CARE_PROVIDER_SITE_OTHER): Payer: Medicare Other | Admitting: *Deleted

## 2012-11-11 VITALS — BP 140/68 | HR 66 | Ht 64.0 in | Wt 100.9 lb

## 2012-11-11 DIAGNOSIS — I713 Abdominal aortic aneurysm, ruptured: Secondary | ICD-10-CM

## 2012-11-11 DIAGNOSIS — Z48812 Encounter for surgical aftercare following surgery on the circulatory system: Secondary | ICD-10-CM

## 2012-11-11 DIAGNOSIS — I739 Peripheral vascular disease, unspecified: Secondary | ICD-10-CM

## 2012-11-11 NOTE — Progress Notes (Signed)
[facility] [place]  Subjective  - Doing much better.  He remembers being in the hospital and some of what happened.  ZOX:WRUEA to left common iliac and right common femoral artery bypass for ruptured infrarenal abdominal aortic aneurysm, aortic endarterectomy, right common femoral endarterectomy with profundoplasty, ligation of inferior mesenteric artery right external iliac artery and right internal iliac artery 09/22/2012.  Followed by fem-pop by-pass by Dr. Imogene Burn 09/30/2012.  The patient was also found to have some and now growing from his blood culture. He is followed by infectious disease for this. He denies fever or chills.   Objective 140/68 66     100%  Abdominal incision clean and dry.  Non tender to palpation. Right LE incisions well healed. Palpable right DP/PT, non palpable left .  Bilateral femoral palpable pulses.  ABI's  right 1.07 Left 0.64  Assessment/Planning: S/P ruptured AAA repair and right fem-pop We have counciled him to stop smoking. He may increased activity as tolerates. He will follow up in 3 months for labs to include ABI's and right arterial Duplex.  Thomasena Edis, Mala Gibbard MAUREEN PA-C  Thomasena Edis, Lyrick Lagrand [facility] 11/11/2012 2:50 PM  History and exam details as above. Greater than 3 minutes they spent regarding smoking cessation counseling.  Fabienne Bruns, MD [facility] [place] Office: [phone] Pager: [phone]

## 2013-02-02 ENCOUNTER — Ambulatory Visit: Payer: Medicare Other | Admitting: Infectious Diseases

## 2013-02-09 ENCOUNTER — Encounter: Payer: Self-pay | Admitting: Family

## 2013-02-10 ENCOUNTER — Ambulatory Visit: Payer: Medicare Other | Admitting: Family

## 2013-02-10 NOTE — Progress Notes (Deleted)
[facility] [place]  Established Open AAA Repair  History of Present Illness  Charles [name] is a 67 y.o. ([birth year]) male who had Aorto to left common iliac and right common femoral artery bypass for ruptured infrarenal abdominal aortic aneurysm, aortic endarterectomy, right common femoral endarterectomy with profundoplasty, ligation of inferior mesenteric artery right external iliac artery and right internal iliac artery 09/22/2012 by Dr. Darrick Penna. Followed by fem-pop by-pass by Dr. Imogene Burn 09/30/2012.   Most recent ABI (Date: ***) demonstrates: *** on the right and *** on the left.  Most recent CTA (Date: ***) demonstrates: ***.  The patient has *** had back or abdominal pain. Claudication: ***  Pt Diabetic: {yes/no:20286} Pt smoker: {Smoker?:15292}  Past Medical History  Diagnosis Date  . Hypercholesteremia   . Collapsed lung     s/p fall, rib fracture    Past Surgical History  Procedure Laterality Date  . Hernia repair    . Pleural scarification      collapsed lung  . Abdominal aortic aneurysm repair N/A 09/21/2012    Procedure: ANEURYSM ABDOMINAL AORTIC REPAIR;  Surgeon: Sherren Kerns, MD;  Location: [facility] OR;  Service: Vascular;  Laterality: N/A;  . Patch angioplasty  09/21/2012    Procedure: PATCH ANGIOPLASTY;  Surgeon: Sherren Kerns, MD;  Location: [facility] OR;  Service: Vascular;;  Hemashield Patch Angioplasty fo Right Common Femoral Artery  . Embolectomy Right 09/21/2012    Procedure: EMBOLECTOMY;  Surgeon: Sherren Kerns, MD;  Location: [facility] OR;  Service: Vascular;  Laterality: Right;  Embolectomy of right leg.  . Wound debridement  09/24/2012    Procedure: Removal of Abdominal wound VAC, and Abdominal Closure;  Surgeon: Sherren Kerns, MD;  Location: [facility] OR;  Service: Vascular;;  . Femoral-popliteal bypass graft Right 09/30/2012    Procedure: BYPASS GRAFT FEMORAL-POPLITEAL ARTERY;  Surgeon: Fransisco Hertz, MD;  Location: [facility] OR;  Service: Vascular;  Laterality:  Right;   Social History History   Social History  . Marital Status: Single    Spouse Name: N/A    Number of Children: N/A  . Years of Education: N/A   Occupational History  . Not on file.   Social History Main Topics  . Smoking status: Current Every Day Smoker -- 0.50 packs/day    Types: Cigarettes  . Smokeless tobacco: Never Used  . Alcohol Use: Yes     Comment: former  . Drug Use: No  . Sexual Activity: Not on file   Other Topics Concern  . Not on file   Social History Narrative  . No narrative on file   Family History Family History  Problem Relation Age of Onset  . Diabetes Mother   . Diabetes Father    Current Outpatient Prescriptions on File Prior to Visit  Medication Sig Dispense Refill  . acetaminophen (TYLENOL) 325 MG tablet Take 1-2 tablets (325-650 mg total) by mouth every 4 (four) hours as needed.      Marland Kitchen alum & mag hydroxide-simeth (MAALOX/MYLANTA) 200-200-20 MG/5ML suspension Take 30 mLs by mouth every 4 (four) hours as needed for indigestion.  355 mL  0  . antiseptic oral rinse (BIOTENE) LIQD 15 mLs by Mouth Rinse route QID.      Marland Kitchen ciprofloxacin (CIPRO) 500 MG tablet Take 1 tablet (500 mg total) by mouth 2 (two) times daily.  180 tablet  6  . dextrose 5 % SOLN 50 mL with cefTRIAXone 2 G SOLR 2 g Inject 2 g into the vein  daily. To continue through June 3rd.      . metoprolol (LOPRESSOR) 50 MG tablet Take 1 tablet (50 mg total) by mouth 2 (two) times daily.      . Multiple Vitamin (MULTIVITAMIN WITH MINERALS) TABS Take 1 tablet by mouth daily.      . QUEtiapine (SEROQUEL XR) 50 MG TB24 Take 1 tablet (50 mg total) by mouth at bedtime.  7 each  0  . saccharomyces boulardii (FLORASTOR) 250 MG capsule Take 1 capsule (250 mg total) by mouth 2 (two) times daily.       No current facility-administered medications on file prior to visit.   No Known Allergies  ROS: [x]  Positive   [ ]  Negative   [ ]  All sytems reviewed and are negative  General: [ ]  Weight loss,  [ ]  Fever, [ ]  chills Neurologic: [ ]  Dizziness, [ ]  Blackouts, [ ]  Seizure [ ]  Stroke, [ ]  "Mini stroke", [ ]  Slurred speech, [ ]  Temporary blindness; [ ]  weakness in arms or legs, [ ]  Hoarseness Cardiac: [ ]  Chest pain/pressure, [ ]  Shortness of breath at rest [ ]  Shortness of breath with exertion, [ ]  Atrial fibrillation or irregular heartbeat Vascular: [ ]  Pain in legs with walking, [ ]  Pain in legs at rest, [ ]  Pain in legs at night,  [ ]  Non-healing ulcer, [ ]  Blood clot in vein/DVT,   Pulmonary: [ ]  Home oxygen, [ ]  Productive cough, [ ]  Coughing up blood, [ ]  Asthma,  [ ]  Wheezing Musculoskeletal:  [ ]  Arthritis, [ ]  Low back pain, [ ]  Joint pain Hematologic: [ ]  Easy Bruising, [ ]  Anemia; [ ]  Hepatitis Gastrointestinal: [ ]  Blood in stool, [ ]  Gastroesophageal Reflux/heartburn, [ ]  Trouble swallowing Urinary: [ ]  chronic Kidney disease, [ ]  on HD - [ ]  MWF or [ ]  TTHS, [ ]  Burning with urination, [ ]  Difficulty urinating Skin: [ ]  Rashes, [ ]  Wounds Psychological: [ ]  Anxiety, [ ]  Depression    Physical Examination  There were no vitals filed for this visit. There is no weight on file to calculate BMI.  General: A&O x 3, WD***, Obese, ***, Cachectic, ***, Ill appear, ***, Somulent,  Pulmonary: Sym exp, good air movt, CTAB, no rales, rhonchi, & wheezing***, + rales, ***, + rhonchi, ***, + wheezing,   Cardiac: RRR, Nl S1, S2, no Murmurs, rubs or gallops***, S3/S4, ***Irregularly, irregular rhythm and rate,   Carotid Bruits Left Right   {FINDINGS; POSITIVE NEGATIVE:[phone]} {FINDINGS; POSITIVE NEGATIVE:[phone]}                             VASCULAR EXAM: Extremities {With/without:5700} ischemic changes *** {With/without:5700} Gangrene of ***; {With/without:5700} open wounds; {With/without:5700} drainage.                                                                                                          LE Pulses LEFT RIGHT       FEMORAL  {PE DOPPLER EXAM  ORTHOSURG:330610::"*** palpable"}  {  PE DOPPLER EXAM ORTHOSURG:330610::"*** palpable"}        POPLITEAL  {PE DOPPLER EXAM ORTHOSURG:330610::"*** palpable"}   {PE DOPPLER EXAM ORTHOSURG:330610::"*** palpable"}       POSTERIOR TIBIAL  {PE DOPPLER EXAM ORTHOSURG:330610::"*** palpable"}   {PE DOPPLER EXAM ORTHOSURG:330610::"*** palpable"}        DORSALIS PEDIS      ANTERIOR TIBIAL {PE DOPPLER EXAM ORTHOSURG:330610::"*** palpable"}   {PE DOPPLER EXAM ORTHOSURG:330610::"*** palpable"}        PERONEAL {PE DOPPLER EXAM ORTHOSURG:330610::"*** Palpable"}   {PE DOPPLER EXAM ORTHOSURG:330610::"*** Palpable"}      Gastrointestinal: soft, NTND, -G/R, - HSM, - masses, - CVAT B***, + AAA , ***, Surg. Inc, ***TTP: ***, +G / +R,  Musculoskeletal: M/S 5/5 throughout *** except ***, Extremities without ischemic changes *** except  ***  Neurologic: Pain and light touch intact in extremities *** except ***, Motor exam as listed above  Non-Invasive Vascular Imaging  ABI (Date: ***)  Right: PTA ***, DPA ***, TBI *** with *** waveforms.  Left: PTA ***, DPA ***, TBI *** with *** waveforms.   Medical Decision Making  PRANSHU LYSTER is a 67 y.o. male who presents s/p OAR.  Pt is ***symptomatic with *** sac size.  I discussed with the patient the importance of surveillance.  The next ABI will be scheduled for *** months.  The next CTA will be scheduled for *** months.  The patient will follow up with [place] in *** months with these studies.  Thank you for allowing [place] to participate in this patient's care.  Charisse March, RN, MSN, FNP-C [facility] [place] Office: [phone]   Clinic Physician: Darrick Penna   02/10/2013, 2:10 PM

## 2013-03-03 NOTE — Progress Notes (Signed)
A user error has taken place: encounter opened in error, closed for administrative reasons.

## 2013-03-09 ENCOUNTER — Ambulatory Visit: Payer: Medicare Other | Admitting: Infectious Diseases

## 2013-03-16 ENCOUNTER — Ambulatory Visit: Payer: Medicare Other | Admitting: Infectious Diseases

## 2013-03-16 ENCOUNTER — Telehealth: Payer: Self-pay | Admitting: *Deleted

## 2013-03-16 NOTE — Telephone Encounter (Signed)
Pt unable to come to today's appointment d/t transportation issues, rescheduled for 11/5 at 3:30. Andree Coss, RN

## 2013-04-06 ENCOUNTER — Ambulatory Visit: Payer: Medicare Other | Admitting: Infectious Diseases

## 2013-04-06 ENCOUNTER — Telehealth: Payer: Self-pay | Admitting: *Deleted

## 2013-04-06 NOTE — Telephone Encounter (Signed)
Left message informing pt of missed appointment. Andree Coss, RN

## 2014-03-01 ENCOUNTER — Other Ambulatory Visit: Payer: Self-pay | Admitting: *Deleted

## 2014-03-01 DIAGNOSIS — R0989 Other specified symptoms and signs involving the circulatory and respiratory systems: Secondary | ICD-10-CM

## 2014-03-01 DIAGNOSIS — I739 Peripheral vascular disease, unspecified: Secondary | ICD-10-CM

## 2014-03-01 DIAGNOSIS — Z48812 Encounter for surgical aftercare following surgery on the circulatory system: Secondary | ICD-10-CM

## 2014-03-14 ENCOUNTER — Encounter: Payer: Self-pay | Admitting: Family

## 2014-03-15 ENCOUNTER — Encounter (HOSPITAL_COMMUNITY): Payer: Medicare Other

## 2014-03-15 ENCOUNTER — Ambulatory Visit: Payer: Medicare Other | Admitting: Family

## 2014-03-15 ENCOUNTER — Other Ambulatory Visit (HOSPITAL_COMMUNITY): Payer: Medicare Other

## 2016-01-16 ENCOUNTER — Encounter (HOSPITAL_COMMUNITY): Payer: Self-pay | Admitting: Emergency Medicine

## 2016-01-16 ENCOUNTER — Emergency Department (HOSPITAL_COMMUNITY): Payer: Medicare Other

## 2016-01-16 ENCOUNTER — Emergency Department (HOSPITAL_COMMUNITY)
Admission: EM | Admit: 2016-01-16 | Discharge: 2016-01-16 | Disposition: A | Discharge status: Home / Self Care | Payer: Medicare Other | Attending: Emergency Medicine | Admitting: Emergency Medicine

## 2016-01-16 DIAGNOSIS — R42 Dizziness and giddiness: Secondary | ICD-10-CM | POA: Diagnosis present

## 2016-01-16 DIAGNOSIS — Z7982 Long term (current) use of aspirin: Secondary | ICD-10-CM | POA: Diagnosis not present

## 2016-01-16 DIAGNOSIS — J069 Acute upper respiratory infection, unspecified: Secondary | ICD-10-CM | POA: Diagnosis not present

## 2016-01-16 DIAGNOSIS — Z79899 Other long term (current) drug therapy: Secondary | ICD-10-CM | POA: Insufficient documentation

## 2016-01-16 DIAGNOSIS — F17210 Nicotine dependence, cigarettes, uncomplicated: Secondary | ICD-10-CM | POA: Insufficient documentation

## 2016-01-16 LAB — CBC WITH DIFFERENTIAL/PLATELET
BASOS ABS: 0 10*3/uL (ref 0.0–0.1)
BASOS PCT: 0 %
EOS ABS: 0 10*3/uL (ref 0.0–0.7)
Eosinophils Relative: 1 %
HCT: 40.1 % (ref 39.0–52.0)
HEMOGLOBIN: 13.1 g/dL (ref 13.0–17.0)
Lymphocytes Relative: 34 %
Lymphs Abs: 1.8 10*3/uL (ref 0.7–4.0)
MCH: 32.8 pg (ref 26.0–34.0)
MCHC: 32.7 g/dL (ref 30.0–36.0)
MCV: 100.5 fL — ABNORMAL HIGH (ref 78.0–100.0)
Monocytes Absolute: 0.5 10*3/uL (ref 0.1–1.0)
Monocytes Relative: 10 %
NEUTROS PCT: 55 %
Neutro Abs: 3 10*3/uL (ref 1.7–7.7)
Platelets: 128 10*3/uL — ABNORMAL LOW (ref 150–400)
RBC: 3.99 MIL/uL — AB (ref 4.22–5.81)
RDW: 13.6 % (ref 11.5–15.5)
WBC: 5.4 10*3/uL (ref 4.0–10.5)

## 2016-01-16 LAB — BASIC METABOLIC PANEL
Anion gap: 6 (ref 5–15)
BUN: 15 mg/dL (ref 6–20)
CHLORIDE: 104 mmol/L (ref 101–111)
CO2: 24 mmol/L (ref 22–32)
CREATININE: 0.75 mg/dL (ref 0.61–1.24)
Calcium: 8.5 mg/dL — ABNORMAL LOW (ref 8.9–10.3)
Glucose, Bld: 85 mg/dL (ref 65–99)
Potassium: 4.1 mmol/L (ref 3.5–5.1)
SODIUM: 134 mmol/L — AB (ref 135–145)

## 2016-01-16 LAB — TROPONIN I

## 2016-01-16 MED ORDER — DOXYCYCLINE HYCLATE 100 MG PO CAPS: 100.0000 mg | ORAL_CAPSULE | Freq: Two times a day (BID) | ORAL | 0 refills | Status: DC

## 2016-01-16 NOTE — ED Triage Notes (Signed)
Patient complaining of chest pain x 2 weeks with "a little short of breath" and dizziness since yesterday.

## 2016-01-16 NOTE — ED Provider Notes (Signed)
[facility] DEPT Provider Note   CSN: [id number] Arrival date & time: 01/16/16  1258     History   Chief Complaint Chief Complaint  Patient presents with  . Dizziness  . Chest Pain    HPI Charles [name] is a 70 y.o. male.  The history is provided by the patient.  Dizziness  Associated symptoms: chest pain   Associated symptoms: no shortness of breath   Chest Pain   Associated symptoms include dizziness. Pertinent negatives include no abdominal pain, no back pain and no shortness of breath.  Patient presents with chest pain. Has had for last couple days. As dull in his right chest. He states both it started a couple days ago and he has had it for the last couple weeks. States began after he got too hot. Occasional cough but he is a smoker. No abdominal pain. A has had a good appetite. No fevers or chills. Reportedly got hot in the car on the way here. No history of coronary artery disease. Does have other vascular bypasses however.  Past Medical History:  Diagnosis Date  . Collapsed lung    s/p fall, rib fracture  . Hypercholesteremia     Patient Active Problem List   Diagnosis Date Noted  . Peripheral vascular disease, unspecified ([place]) 11/11/2012  . Encephalopathy, metabolic 01/60/1093  . Acute post-hemorrhagic anemia 10/11/2012  . Delirium 10/04/2012  . Salmonella bacteremia 09/25/2012  . Acute respiratory failure ([place]) 09/22/2012  . Hyponatremia 09/21/2012  . Adult failure to thrive 09/21/2012  . Dehydration 09/21/2012  . Generalized weakness 09/21/2012  . AAA (abdominal aortic aneurysm, ruptured) ([place]) 09/21/2012  . New onset atrial fibrillation ([place]) 09/21/2012  . Alcohol abuse 09/21/2012    Past Surgical History:  Procedure Laterality Date  . ABDOMINAL AORTIC ANEURYSM REPAIR N/A 09/21/2012   Procedure: ANEURYSM ABDOMINAL AORTIC REPAIR;  Surgeon: Elam Dutch, MD;  Location: [facility];  Service: Vascular;  Laterality: N/A;  . EMBOLECTOMY Right 09/21/2012    Procedure: EMBOLECTOMY;  Surgeon: Elam Dutch, MD;  Location: [facility];  Service: Vascular;  Laterality: Right;  Embolectomy of right leg.  Marland Kitchen FEMORAL-POPLITEAL BYPASS GRAFT Right 09/30/2012   Procedure: BYPASS GRAFT FEMORAL-POPLITEAL ARTERY;  Surgeon: Conrad [place], MD;  Location: [facility];  Service: Vascular;  Laterality: Right;  . HERNIA REPAIR    . PATCH ANGIOPLASTY  09/21/2012   Procedure: PATCH ANGIOPLASTY;  Surgeon: Elam Dutch, MD;  Location: [facility] OR;  Service: Vascular;;  Hemashield Patch Angioplasty fo Right Common Femoral Artery  . PLEURAL SCARIFICATION     collapsed lung  . WOUND DEBRIDEMENT  09/24/2012   Procedure: Removal of Abdominal wound VAC, and Abdominal Closure;  Surgeon: Elam Dutch, MD;  Location: [place];  Service: Vascular;;       Home Medications    Prior to Admission medications   Medication Sig Start Date End Date Taking? Authorizing Provider  acetaminophen (TYLENOL) 325 MG tablet Take 1-2 tablets (325-650 mg total) by mouth every 4 (four) hours as needed. Patient taking differently: Take 325-650 mg by mouth every 4 (four) hours as needed for mild pain.  10/18/12  Yes Ivan Anchors Love, PA-C  aspirin EC 81 MG tablet Take 162 mg by mouth daily.   Yes Historical Provider, MD  alum & mag hydroxide-simeth (MAALOX/MYLANTA) 200-200-20 MG/5ML suspension Take 30 mLs by mouth every 4 (four) hours as needed for indigestion. 10/18/12   Bary Leriche, PA-C  antiseptic oral rinse (BIOTENE) LIQD 15 mLs  by Mouth Rinse route QID. Patient not taking: Reported on 01/16/2016 10/18/12   Ivan Anchors Love, PA-C  ciprofloxacin (CIPRO) 500 MG tablet Take 1 tablet (500 mg total) by mouth 2 (two) times daily. Patient not taking: Reported on 01/16/2016 11/02/12   Campbell Riches, MD  dextrose 5 % SOLN 50 mL with cefTRIAXone 2 G SOLR 2 g Inject 2 g into the vein daily. To continue through June 3rd. Patient not taking: Reported on 01/16/2016 10/18/12   Ivan Anchors Love, PA-C  doxycycline (VIBRAMYCIN) 100  MG capsule Take 1 capsule (100 mg total) by mouth 2 (two) times daily. 01/16/16   Davonna Belling, MD  metoprolol (LOPRESSOR) 50 MG tablet Take 1 tablet (50 mg total) by mouth 2 (two) times daily. Patient not taking: Reported on 01/16/2016 10/18/12   Ivan Anchors Love, PA-C  Multiple Vitamin (MULTIVITAMIN WITH MINERALS) TABS Take 1 tablet by mouth daily. Patient not taking: Reported on 01/16/2016 10/18/12   Ivan Anchors Love, PA-C  QUEtiapine (SEROQUEL XR) 50 MG TB24 Take 1 tablet (50 mg total) by mouth at bedtime. Patient not taking: Reported on 01/16/2016 10/18/12   Ivan Anchors Love, PA-C  saccharomyces boulardii (FLORASTOR) 250 MG capsule Take 1 capsule (250 mg total) by mouth 2 (two) times daily. Patient not taking: Reported on 01/16/2016 10/18/12   Bary Leriche, PA-C    Family History Family History  Problem Relation Age of Onset  . Diabetes Mother   . Diabetes Father     Social History Social History  Substance Use Topics  . Smoking status: Current Every Day Smoker    Packs/day: 0.50    Types: Cigarettes  . Smokeless tobacco: Never Used  . Alcohol use Yes     Comment: occasionally     Allergies   Review of patient's allergies indicates no known allergies.   Review of Systems Review of Systems  Constitutional: Positive for fatigue. Negative for appetite change.  HENT: Negative for trouble swallowing.   Eyes: Negative for photophobia.  Respiratory: Negative for shortness of breath and wheezing.   Cardiovascular: Positive for chest pain. Negative for leg swelling.  Gastrointestinal: Negative for abdominal pain.  Endocrine: Negative for polydipsia.  Genitourinary: Negative for flank pain.  Musculoskeletal: Negative for back pain and joint swelling.  Neurological: Positive for dizziness.  Hematological: Negative for adenopathy.     Physical Exam Updated Vital Signs BP 148/65   Pulse (!) 55   Temp 97.4 F (36.3 C) (Axillary)   Resp 12   Ht '5\' 4"'$  (1.626 m)   Wt 100 lb (45.4 kg)    SpO2 99%   BMI 17.16 kg/m   Physical Exam  Constitutional: He appears well-developed.  HENT:  Head: Normocephalic.  Eyes: Pupils are equal, round, and reactive to light.  Neck: Normal range of motion.  Cardiovascular: Normal rate.   Pulmonary/Chest: Effort normal.  Slightly harsh breath sounds.  Abdominal: Soft. There is no tenderness.  Musculoskeletal: He exhibits no edema.  Neurological: He is alert.  Skin: Skin is warm.     ED Treatments / Results  Labs (all labs ordered are listed, but only abnormal results are displayed) Labs Reviewed  BASIC METABOLIC PANEL - Abnormal; Notable for the following:       Result Value   Sodium 134 (*)    Calcium 8.5 (*)    All other components within normal limits  CBC WITH DIFFERENTIAL/PLATELET - Abnormal; Notable for the following:    RBC 3.99 (*)  MCV 100.5 (*)    Platelets 128 (*)    All other components within normal limits  TROPONIN I    EKG  EKG Interpretation  Date/Time:  Wednesday January 16 2016 13:04:44 EDT Ventricular Rate:  54 PR Interval:    QRS Duration: 103 QT Interval:  464 QTC Calculation: 440 R Axis:   10 Text Interpretation:  Sinus or ectopic atrial rhythm Atrial premature complex Probable left ventricular hypertrophy Anterior Q waves, possibly due to LVH Borderline T abnormalities, inferior leads Confirmed by Alvino Chapel  MD, Suzzette Gasparro [phone]) on 01/16/2016 1:13:22 PM       Radiology Dg Chest 2 View  Result Date: 01/16/2016 CLINICAL DATA:  Two week history of chest pain EXAM: CHEST  2 VIEW COMPARISON:  Oct 04, 2012 FINDINGS: The lungs are hyperexpanded. There are scattered areas of scarring bilaterally, most notably in the right upper lobe. There is no edema or consolidation. The heart size is within normal limits. Somewhat diminished pulmonary vascularity is indicative of the underlying emphysematous change. There is atherosclerotic calcification in the aorta. No bone lesions. There is calcification in both  carotid arteries. IMPRESSION: Underlying emphysematous change with areas of scarring. No edema or consolidation. Aortic atherosclerosis. Calcification in both carotid arteries. Electronically Signed   By: Lowella Grip III M.D.   On: 01/16/2016 14:24    Procedures Procedures (including critical care time)  Medications Ordered in ED Medications - No data to display   Initial Impression / Assessment and Plan / ED Course  I have reviewed the triage vital signs and the nursing notes.  Pertinent labs & imaging results that were available during my care of the patient were reviewed by me and considered in my medical decision making (see chart for details).  Clinical Course    Patient with generalized weakness. Labs overall reassuring. Has emphysema on x-ray. Has had increased coughing with sputum. Will give antibiotics. Labs otherwise overall reassuring. Will discharge to follow-up as needed. Has an ectopic atrial rhythm on EKG that appears to be new.  Final Clinical Impressions(s) / ED Diagnoses   Final diagnoses:  URI (upper respiratory infection)    New Prescriptions New Prescriptions   DOXYCYCLINE (VIBRAMYCIN) 100 MG CAPSULE    Take 1 capsule (100 mg total) by mouth 2 (two) times daily.     Davonna Belling, MD 01/16/16 [phone]

## 2016-01-16 NOTE — ED Notes (Signed)
Patient transported to X-ray 

## 2016-01-16 NOTE — Discharge Instructions (Signed)
Follow up with your primary care doctor.

## 2016-04-23 ENCOUNTER — Emergency Department (HOSPITAL_COMMUNITY)
Admission: EM | Admit: 2016-04-23 | Discharge: 2016-04-24 | Disposition: A | Discharge status: Home / Self Care | Payer: Medicare Other | Attending: Emergency Medicine | Admitting: Emergency Medicine

## 2016-04-23 ENCOUNTER — Encounter (HOSPITAL_COMMUNITY): Payer: Self-pay | Admitting: Emergency Medicine

## 2016-04-23 ENCOUNTER — Emergency Department (HOSPITAL_COMMUNITY): Payer: Medicare Other

## 2016-04-23 ENCOUNTER — Emergency Department (HOSPITAL_COMMUNITY)
Admission: EM | Admit: 2016-04-23 | Discharge: 2016-04-23 | Discharge status: Against Medical Advice | Payer: Medicare Other | Source: Home / Self Care | Attending: Emergency Medicine | Admitting: Emergency Medicine

## 2016-04-23 DIAGNOSIS — F10930 Alcohol use, unspecified with withdrawal, uncomplicated: Secondary | ICD-10-CM

## 2016-04-23 DIAGNOSIS — Z79899 Other long term (current) drug therapy: Secondary | ICD-10-CM | POA: Diagnosis not present

## 2016-04-23 DIAGNOSIS — R05 Cough: Secondary | ICD-10-CM

## 2016-04-23 DIAGNOSIS — F17210 Nicotine dependence, cigarettes, uncomplicated: Secondary | ICD-10-CM | POA: Insufficient documentation

## 2016-04-23 DIAGNOSIS — F1010 Alcohol abuse, uncomplicated: Secondary | ICD-10-CM

## 2016-04-23 DIAGNOSIS — F1019 Alcohol abuse with unspecified alcohol-induced disorder: Secondary | ICD-10-CM

## 2016-04-23 DIAGNOSIS — I10 Essential (primary) hypertension: Secondary | ICD-10-CM

## 2016-04-23 DIAGNOSIS — F10230 Alcohol dependence with withdrawal, uncomplicated: Secondary | ICD-10-CM | POA: Insufficient documentation

## 2016-04-23 DIAGNOSIS — R079 Chest pain, unspecified: Secondary | ICD-10-CM | POA: Insufficient documentation

## 2016-04-23 DIAGNOSIS — R4182 Altered mental status, unspecified: Secondary | ICD-10-CM | POA: Diagnosis present

## 2016-04-23 DIAGNOSIS — R0602 Shortness of breath: Secondary | ICD-10-CM | POA: Insufficient documentation

## 2016-04-23 HISTORY — DX: Abdominal aortic aneurysm, without rupture: I71.4

## 2016-04-23 HISTORY — DX: Bacteremia: R78.81

## 2016-04-23 HISTORY — DX: Abdominal aortic aneurysm, without rupture, unspecified: I71.40

## 2016-04-23 HISTORY — DX: Essential (primary) hypertension: I10

## 2016-04-23 LAB — CBC
HCT: 38.2 % — ABNORMAL LOW (ref 39.0–52.0)
Hemoglobin: 12.7 g/dL — ABNORMAL LOW (ref 13.0–17.0)
MCH: 32.9 pg (ref 26.0–34.0)
MCHC: 33.2 g/dL (ref 30.0–36.0)
MCV: 99 fL (ref 78.0–100.0)
Platelets: 164 10*3/uL (ref 150–400)
RBC: 3.86 MIL/uL — ABNORMAL LOW (ref 4.22–5.81)
RDW: 13.8 % (ref 11.5–15.5)
WBC: 6.8 10*3/uL (ref 4.0–10.5)

## 2016-04-23 LAB — COMPREHENSIVE METABOLIC PANEL
ALBUMIN: 3.4 g/dL — AB (ref 3.5–5.0)
ALK PHOS: 59 U/L (ref 38–126)
ALT: 19 U/L (ref 17–63)
AST: 37 U/L (ref 15–41)
Anion gap: 11 (ref 5–15)
BILIRUBIN TOTAL: 0.7 mg/dL (ref 0.3–1.2)
BUN: 29 mg/dL — ABNORMAL HIGH (ref 6–20)
CALCIUM: 8.8 mg/dL — AB (ref 8.9–10.3)
CO2: 25 mmol/L (ref 22–32)
CREATININE: 0.99 mg/dL (ref 0.61–1.24)
Chloride: 95 mmol/L — ABNORMAL LOW (ref 101–111)
GFR calc non Af Amer: >60 mL/min (ref >60)
GLUCOSE: 118 mg/dL — AB (ref 65–99)
Potassium: 3.9 mmol/L (ref 3.5–5.1)
SODIUM: 131 mmol/L — AB (ref 135–145)
Total Protein: 7.5 g/dL (ref 6.5–8.1)

## 2016-04-23 LAB — CBC WITH DIFFERENTIAL/PLATELET
BASOS PCT: 0 %
Basophils Absolute: 0 10*3/uL (ref 0.0–0.1)
EOS ABS: 0 10*3/uL (ref 0.0–0.7)
EOS PCT: 0 %
HCT: 37.6 % — ABNORMAL LOW (ref 39.0–52.0)
Hemoglobin: 12.6 g/dL — ABNORMAL LOW (ref 13.0–17.0)
Lymphocytes Relative: 26 %
Lymphs Abs: 1.5 10*3/uL (ref 0.7–4.0)
MCH: 33.1 pg (ref 26.0–34.0)
MCHC: 33.5 g/dL (ref 30.0–36.0)
MCV: 98.7 fL (ref 78.0–100.0)
MONO ABS: 0.7 10*3/uL (ref 0.1–1.0)
MONOS PCT: 13 %
Neutro Abs: 3.4 10*3/uL (ref 1.7–7.7)
Neutrophils Relative %: 61 %
PLATELETS: 153 10*3/uL (ref 150–400)
RBC: 3.81 MIL/uL — ABNORMAL LOW (ref 4.22–5.81)
RDW: 13.7 % (ref 11.5–15.5)
WBC: 5.6 10*3/uL (ref 4.0–10.5)

## 2016-04-23 LAB — BASIC METABOLIC PANEL
Anion gap: 13 (ref 5–15)
BUN: 27 mg/dL — ABNORMAL HIGH (ref 6–20)
CO2: 25 mmol/L (ref 22–32)
Calcium: 9.3 mg/dL (ref 8.9–10.3)
Chloride: 98 mmol/L — ABNORMAL LOW (ref 101–111)
Creatinine, Ser: 0.82 mg/dL (ref 0.61–1.24)
GFR calc Af Amer: >60 mL/min (ref >60)
GFR calc non Af Amer: >60 mL/min (ref >60)
Glucose, Bld: 90 mg/dL (ref 65–99)
Potassium: 3.6 mmol/L (ref 3.5–5.1)
Sodium: 136 mmol/L (ref 135–145)

## 2016-04-23 LAB — URINALYSIS, ROUTINE W REFLEX MICROSCOPIC
Glucose, UA: NEGATIVE mg/dL
Hgb urine dipstick: NEGATIVE
Ketones, ur: 15 mg/dL — AB
LEUKOCYTES UA: NEGATIVE
NITRITE: NEGATIVE
Protein, ur: NEGATIVE mg/dL
SPECIFIC GRAVITY, URINE: 1.02 (ref 1.005–1.030)
pH: 5.5 (ref 5.0–8.0)

## 2016-04-23 LAB — RAPID URINE DRUG SCREEN, HOSP PERFORMED
AMPHETAMINES: NOT DETECTED
Barbiturates: NOT DETECTED
Benzodiazepines: NOT DETECTED
COCAINE: NOT DETECTED
OPIATES: NOT DETECTED
Tetrahydrocannabinol: POSITIVE — AB

## 2016-04-23 LAB — TSH: TSH: 1.493 u[IU]/mL (ref 0.350–4.500)

## 2016-04-23 LAB — ETHANOL: Alcohol, Ethyl (B): <5 mg/dL (ref <5)

## 2016-04-23 LAB — TROPONIN I: Troponin I: <0.03 ng/mL (ref <0.03)

## 2016-04-23 MED ORDER — SODIUM CHLORIDE 0.9 % IV BOLUS (SEPSIS)
1000.0000 mL | Freq: Once | INTRAVENOUS | Status: AC
  Administered 2016-04-23: 1000 mL via INTRAVENOUS

## 2016-04-23 MED ORDER — ZIPRASIDONE HCL 20 MG PO CAPS
ORAL_CAPSULE | ORAL | Status: AC
  Filled 2016-04-23: qty 1

## 2016-04-23 MED ORDER — ZIPRASIDONE HCL 20 MG PO CAPS
20.0000 mg | ORAL_CAPSULE | Freq: Once | ORAL | Status: AC
Start: 2016-04-23 — End: 2016-04-23
  Administered 2016-04-23: 20 mg via ORAL
  Filled 2016-04-23: qty 1

## 2016-04-23 NOTE — Progress Notes (Signed)
IVC papers have been received by [facility] who reported that doctor is reviewing the referral and decision is pending.  CSW will continue to follow up.  Verlon Setting, [place] Disposition staff 04/23/2016 11:01 PM

## 2016-04-23 NOTE — ED Notes (Signed)
Daughter, Arethia left phone number [phone]

## 2016-04-23 NOTE — ED Notes (Signed)
Pt family Contact Modena Slater- (cell) [phone]  1st contact Iesha's sister  Gustave Lindeman    [phone]  2nd contact

## 2016-04-23 NOTE — ED Notes (Signed)
Pt fussing and cursing, removing monitor leads. Pt states he needs a drink and wants to leave. EDP notified, states is ok if patient leaves. Security at bedside.  IV removed.

## 2016-04-23 NOTE — Progress Notes (Signed)
Received call from [facility] who requested pt's IVC. IVC papers received and faxed to [facility]. Writer to continue to follow up with placement.  Verlon Setting, [place] Disposition staff 04/23/2016 10:47 PM

## 2016-04-23 NOTE — ED Notes (Signed)
Pt brought in by nephew. Per nephew pt had a change in mental status yesterday. Pt began to be combative, confused and uncooperative with family. Pt cursing and refusing to answer questions. Nephew states this is a change from pt's baseline.

## 2016-04-23 NOTE — ED Provider Notes (Signed)
[facility] DEPT Provider Note   CSN: [id number] Arrival date & time: 04/23/16  1143   By signing my name below, I, Delton Prairie, attest that this documentation has been prepared under the direction and in the presence of Virgel Manifold, MD  Electronically Signed: Delton Prairie, ED Scribe. 04/23/16. 1:33 PM.   History   Chief Complaint Chief Complaint  Patient presents with  . Chest Pain   The history is provided by the patient. No language interpreter was used.   HPI Comments: LEVEL 5 CAVEAT DUE TO POOR HISTORIAN Charles [name] is a 70 y.o. male, with a hx of HTN and AAA, who presents to the Emergency Department complaining of unchanged, persistent chest pain for unnoted amount of time, He notes associated SOB and mildly productive cough. No alleviating factors noted. Pt denies fevers, any other associated symptoms and modifying factors at this time. Pt notes a hx of drug use. He states it's been a "long time" since his last drug use.    Past Medical History:  Diagnosis Date  . AAA (abdominal aortic aneurysm) ([place])   . Collapsed lung    s/p fall, rib fracture  . Hypercholesteremia   . Hypertension   . Salmonella bacteremia     Patient Active Problem List   Diagnosis Date Noted  . Peripheral vascular disease, unspecified 11/11/2012  . Encephalopathy, metabolic 75/03/2584  . Acute post-hemorrhagic anemia 10/11/2012  . Delirium 10/04/2012  . Salmonella bacteremia 09/25/2012  . Acute respiratory failure ([place]) 09/22/2012  . Hyponatremia 09/21/2012  . Adult failure to thrive 09/21/2012  . Dehydration 09/21/2012  . Generalized weakness 09/21/2012  . AAA (abdominal aortic aneurysm, ruptured) ([place]) 09/21/2012  . New onset atrial fibrillation ([place]) 09/21/2012  . Alcohol abuse 09/21/2012    Past Surgical History:  Procedure Laterality Date  . ABDOMINAL AORTIC ANEURYSM REPAIR N/A 09/21/2012   Procedure: ANEURYSM ABDOMINAL AORTIC REPAIR;  Surgeon: Elam Dutch, MD;   Location: [place];  Service: Vascular;  Laterality: N/A;  . EMBOLECTOMY Right 09/21/2012   Procedure: EMBOLECTOMY;  Surgeon: Elam Dutch, MD;  Location: [place];  Service: Vascular;  Laterality: Right;  Embolectomy of right leg.  Marland Kitchen FEMORAL-POPLITEAL BYPASS GRAFT Right 09/30/2012   Procedure: BYPASS GRAFT FEMORAL-POPLITEAL ARTERY;  Surgeon: Conrad Ackworth, MD;  Location: [facility];  Service: Vascular;  Laterality: Right;  . HERNIA REPAIR    . PATCH ANGIOPLASTY  09/21/2012   Procedure: PATCH ANGIOPLASTY;  Surgeon: Elam Dutch, MD;  Location: [facility] OR;  Service: Vascular;;  Hemashield Patch Angioplasty fo Right Common Femoral Artery  . PLEURAL SCARIFICATION     collapsed lung  . WOUND DEBRIDEMENT  09/24/2012   Procedure: Removal of Abdominal wound VAC, and Abdominal Closure;  Surgeon: Elam Dutch, MD;  Location: [place];  Service: Vascular;;       Home Medications    Prior to Admission medications   Medication Sig Start Date End Date Taking? Authorizing Provider  acetaminophen (TYLENOL) 325 MG tablet Take 1-2 tablets (325-650 mg total) by mouth every 4 (four) hours as needed. Patient taking differently: Take 325-650 mg by mouth every 4 (four) hours as needed for mild pain.  10/18/12   Bary Leriche, PA-C  alum & mag hydroxide-simeth (MAALOX/MYLANTA) 200-200-20 MG/5ML suspension Take 30 mLs by mouth every 4 (four) hours as needed for indigestion. 10/18/12   Bary Leriche, PA-C  antiseptic oral rinse (BIOTENE) LIQD 15 mLs by Mouth Rinse route QID. Patient not taking: Reported on 01/16/2016  10/18/12   Bary Leriche, PA-C  aspirin EC 81 MG tablet Take 162 mg by mouth daily.    Historical Provider, MD  ciprofloxacin (CIPRO) 500 MG tablet Take 1 tablet (500 mg total) by mouth 2 (two) times daily. Patient not taking: Reported on 01/16/2016 11/02/12   Campbell Riches, MD  dextrose 5 % SOLN 50 mL with cefTRIAXone 2 G SOLR 2 g Inject 2 g into the vein daily. To continue through June 3rd. Patient not taking:  Reported on 01/16/2016 10/18/12   Ivan Anchors Love, PA-C  doxycycline (VIBRAMYCIN) 100 MG capsule Take 1 capsule (100 mg total) by mouth 2 (two) times daily. 01/16/16   Davonna Belling, MD  metoprolol (LOPRESSOR) 50 MG tablet Take 1 tablet (50 mg total) by mouth 2 (two) times daily. Patient not taking: Reported on 01/16/2016 10/18/12   Ivan Anchors Love, PA-C  Multiple Vitamin (MULTIVITAMIN WITH MINERALS) TABS Take 1 tablet by mouth daily. Patient not taking: Reported on 01/16/2016 10/18/12   Ivan Anchors Love, PA-C  QUEtiapine (SEROQUEL XR) 50 MG TB24 Take 1 tablet (50 mg total) by mouth at bedtime. Patient not taking: Reported on 01/16/2016 10/18/12   Ivan Anchors Love, PA-C  saccharomyces boulardii (FLORASTOR) 250 MG capsule Take 1 capsule (250 mg total) by mouth 2 (two) times daily. Patient not taking: Reported on 01/16/2016 10/18/12   Bary Leriche, PA-C    Family History Family History  Problem Relation Age of Onset  . Diabetes Mother   . Diabetes Father     Social History Social History  Substance Use Topics  . Smoking status: Current Every Day Smoker    Packs/day: 0.50    Types: Cigarettes  . Smokeless tobacco: Never Used  . Alcohol use Yes     Comment: occasionally     Allergies   Patient has no known allergies.   Review of Systems Review of Systems  Reason unable to perform ROS: Poor Historian   Constitutional: Negative for fever.  Respiratory: Positive for cough and shortness of breath.   Cardiovascular: Positive for chest pain.   Physical Exam Updated Vital Signs BP (!) 179/108 (BP Location: Left Arm)   Pulse 87   Temp 97.6 F (36.4 C) (Oral)   Resp 18   Ht '5\' 6"'$  (1.676 m)   Wt 100 lb (45.4 kg)   SpO2 98%   BMI 16.14 kg/m   Physical Exam  Constitutional: He is oriented to person, place, and time. He appears well-developed and well-nourished.  Cachetic  HENT:  Head: Normocephalic and atraumatic.  Eyes: EOM are normal.  Neck: Normal range of motion.  Cardiovascular:  Normal rate and regular rhythm.   No murmur heard. Pulmonary/Chest: Effort normal and breath sounds normal. No respiratory distress.  Abdominal: Soft. He exhibits no distension. There is no tenderness.  Musculoskeletal: Normal range of motion. He exhibits no edema.  Neurological: He is alert and oriented to person, place, and time.  Rambling speech, voice is tremulous. Thought process tangential.   Skin: Skin is warm and dry.  Psychiatric: He has a normal mood and affect. Judgment normal.  Nursing note and vitals reviewed.   ED Treatments / Results  DIAGNOSTIC STUDIES:  Oxygen Saturation is 98% on RA, normal by my interpretation.    COORDINATION OF CARE:  1:13 PM Discussed treatment plan with pt at bedside and pt agreed to plan.  Labs (all labs ordered are listed, but only abnormal results are displayed) Labs Reviewed  BASIC METABOLIC PANEL -  Abnormal; Notable for the following:       Result Value   Chloride 98 (*)    BUN 27 (*)    All other components within normal limits  CBC - Abnormal; Notable for the following:    RBC 3.86 (*)    Hemoglobin 12.7 (*)    HCT 38.2 (*)    All other components within normal limits  TROPONIN I    EKG  EKG Interpretation  Date/Time:  Wednesday April 23 2016 11:58:33 EST Ventricular Rate:  81 PR Interval:    QRS Duration: 111 QT Interval:  393 QTC Calculation: 457 R Axis:   36 Text Interpretation:  Sinus or ectopic atrial rhythm Multiple premature complexes, vent & supraven Borderline short PR interval Left ventricular hypertrophy Anterior Q waves, possibly due to LVH Baseline wander in lead(s) V3 Confirmed by Lacinda Axon  MD, BRIAN ([id number]) on 04/23/2016 12:54:20 PM       Radiology Dg Chest 2 View  Result Date: 04/23/2016 CLINICAL DATA:  Chest pain EXAM: CHEST  2 VIEW COMPARISON:  01/16/2016 FINDINGS: Cardiac shadow is within normal limits. Aortic calcifications are noted. The lungs are hyper aerated bilaterally without focal  infiltrate or sizable effusion. Bilateral nipple shadows are noted. 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 Electronically Signed   By: Inez Catalina M.D.   On: 04/23/2016 12:40    Procedures Procedures (including critical care time)  Medications Ordered in ED Medications - No data to display   Initial Impression / Assessment and Plan / ED Course  I have reviewed the triage vital signs and the nursing notes.  Pertinent labs & imaging results that were available during my care of the patient were reviewed by me and considered in my medical decision making (see chart for details).  Clinical Course     Final Clinical Impressions(s) / ED Diagnoses   Final diagnoses:  Nonspecific chest pain  Alcohol abuse    New Prescriptions New Prescriptions   No medications on file    I personally preformed the services scribed in my presence. The recorded information has been reviewed is accurate. Virgel Manifold, MD.     Virgel Manifold, MD 05/01/16 [phone]

## 2016-04-23 NOTE — ED Provider Notes (Signed)
[facility] DEPT Provider Note   CSN: [id number] Arrival date & time: 04/23/16  1709     History   Chief Complaint Chief Complaint  Patient presents with  . Altered Mental Status    HPI Charles [name] is a 70 y.o. male.  HPI  The patient is a 70 year old male, he has a history of hypertension and hypercholesterolemia as well as a known history of a aortic aneurysm, history of hyponatremia, failure to thrive and a metabolic encephalopathy in the past. The patient's daughter presents with him today stating that he has not been himself for the last 3 days, he has had increasing amounts of hallucinations, she feels that he is delirious talking about things that are not really happening, he thinks that someone shot his best friend in the head and shot his face off despite talking to him on the phone at the same time, he thinks that there is a cold that is after him. He was in the emergency department earlier in the day where he complained of some chest pain and then left without telling anybody. The workup from that visit was pretty unremarkable. At this time the patient refuses to answer any of my questions but continues to talk about his urine sample and how we need to look at it while it is warm because of it cools off it will turn bad.  Past Medical History:  Diagnosis Date  . AAA (abdominal aortic aneurysm) ([place])   . Collapsed lung    s/p fall, rib fracture  . Hypercholesteremia   . Hypertension   . Salmonella bacteremia     Patient Active Problem List   Diagnosis Date Noted  . Peripheral vascular disease, unspecified 11/11/2012  . Encephalopathy, metabolic 16/96/7893  . Acute post-hemorrhagic anemia 10/11/2012  . Delirium 10/04/2012  . Salmonella bacteremia 09/25/2012  . Acute respiratory failure ([place]) 09/22/2012  . Hyponatremia 09/21/2012  . Adult failure to thrive 09/21/2012  . Dehydration 09/21/2012  . Generalized weakness 09/21/2012  . AAA (abdominal aortic  aneurysm, ruptured) ([place]) 09/21/2012  . New onset atrial fibrillation ([place]) 09/21/2012  . Alcohol abuse 09/21/2012    Past Surgical History:  Procedure Laterality Date  . ABDOMINAL AORTIC ANEURYSM REPAIR N/A 09/21/2012   Procedure: ANEURYSM ABDOMINAL AORTIC REPAIR;  Surgeon: Elam Dutch, MD;  Location: [facility];  Service: Vascular;  Laterality: N/A;  . EMBOLECTOMY Right 09/21/2012   Procedure: EMBOLECTOMY;  Surgeon: Elam Dutch, MD;  Location: [place];  Service: Vascular;  Laterality: Right;  Embolectomy of right leg.  Marland Kitchen FEMORAL-POPLITEAL BYPASS GRAFT Right 09/30/2012   Procedure: BYPASS GRAFT FEMORAL-POPLITEAL ARTERY;  Surgeon: Conrad [place], MD;  Location: [place];  Service: Vascular;  Laterality: Right;  . HERNIA REPAIR    . PATCH ANGIOPLASTY  09/21/2012   Procedure: PATCH ANGIOPLASTY;  Surgeon: Elam Dutch, MD;  Location: [facility] OR;  Service: Vascular;;  Hemashield Patch Angioplasty fo Right Common Femoral Artery  . PLEURAL SCARIFICATION     collapsed lung  . WOUND DEBRIDEMENT  09/24/2012   Procedure: Removal of Abdominal wound VAC, and Abdominal Closure;  Surgeon: Elam Dutch, MD;  Location: [place];  Service: Vascular;;       Home Medications    Prior to Admission medications   Medication Sig Start Date End Date Taking? Authorizing Provider  acetaminophen (TYLENOL) 325 MG tablet Take 1-2 tablets (325-650 mg total) by mouth every 4 (four) hours as needed. Patient taking differently: Take 325-650 mg by mouth every  4 (four) hours as needed for mild pain.  10/18/12  Yes Ivan Anchors Love, PA-C  ibuprofen (ADVIL,MOTRIN) 200 MG tablet Take 800 mg by mouth every 6 (six) hours as needed.   Yes Historical Provider, MD  LORazepam (ATIVAN) 1 MG tablet Take 1 tablet (1 mg total) by mouth every 6 (six) hours as needed for anxiety (or shakiness). 71/69/67   Delora Fuel, MD    Family History Family History  Problem Relation Age of Onset  . Diabetes Mother   . Diabetes Father     Social  History Social History  Substance Use Topics  . Smoking status: Current Every Day Smoker    Packs/day: 0.50    Types: Cigarettes  . Smokeless tobacco: Never Used  . Alcohol use Yes     Comment: occasionally     Allergies   Patient has no known allergies.   Review of Systems Review of Systems  All other systems reviewed and are negative.    Physical Exam Updated Vital Signs BP 146/76   Pulse 74   Temp 98.2 F (36.8 C) (Oral)   Resp 22   Ht '5\' 6"'$  (1.676 m)   Wt 100 lb (45.4 kg)   SpO2 99%   BMI 16.14 kg/m   Physical Exam  Constitutional: He appears well-developed and well-nourished. No distress.  HENT:  Head: Normocephalic and atraumatic.  Mouth/Throat: Oropharynx is clear and moist. No oropharyngeal exudate.  Eyes: Conjunctivae and EOM are normal. Pupils are equal, round, and reactive to light. Right eye exhibits no discharge. Left eye exhibits no discharge. No scleral icterus.  Neck: Normal range of motion. Neck supple. No JVD present. No thyromegaly present.  Cardiovascular: Normal rate, regular rhythm, normal heart sounds and intact distal pulses.  Exam reveals no gallop and no friction rub.   No murmur heard. Pulmonary/Chest: Effort normal and breath sounds normal. No respiratory distress. He has no wheezes. He has no rales.  Abdominal: Soft. Bowel sounds are normal. He exhibits no distension and no mass. There is no tenderness.  Musculoskeletal: Normal range of motion. He exhibits no edema or tenderness.  Lymphadenopathy:    He has no cervical adenopathy.  Neurological: He is alert. Coordination normal.  Skin: Skin is warm and dry. No rash noted. No erythema.  Psychiatric:  bizzarre affect, he has racing thoughts, pressured speech and delusions.  He is expressing paranoia   Nursing note and vitals reviewed.    ED Treatments / Results  Labs (all labs ordered are listed, but only abnormal results are displayed) Labs Reviewed  URINALYSIS, ROUTINE W REFLEX  MICROSCOPIC (NOT AT [facility]) - Abnormal; Notable for the following:       Result Value   Bilirubin Urine SMALL (*)    Ketones, ur 15 (*)    All other components within normal limits  RAPID URINE DRUG SCREEN, HOSP PERFORMED - Abnormal; Notable for the following:    Tetrahydrocannabinol POSITIVE (*)    All other components within normal limits  COMPREHENSIVE METABOLIC PANEL - Abnormal; Notable for the following:    Sodium 131 (*)    Chloride 95 (*)    Glucose, Bld 118 (*)    BUN 29 (*)    Calcium 8.8 (*)    Albumin 3.4 (*)    All other components within normal limits  CBC WITH DIFFERENTIAL/PLATELET - Abnormal; Notable for the following:    RBC 3.81 (*)    Hemoglobin 12.6 (*)    HCT 37.6 (*)  All other components within normal limits  TSH  ETHANOL    EKG  EKG Interpretation None       Radiology Dg Chest 2 View  Result Date: 04/23/2016 CLINICAL DATA:  Chest pain EXAM: CHEST  2 VIEW COMPARISON:  01/16/2016 FINDINGS: Cardiac shadow is within normal limits. Aortic calcifications are noted. The lungs are hyper aerated bilaterally without focal infiltrate or sizable effusion. Bilateral nipple shadows are noted. 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 Electronically Signed   By: Inez Catalina M.D.   On: 04/23/2016 12:40    Procedures Procedures (including critical care time)  Medications Ordered in ED Medications  sodium chloride 0.9 % bolus 1,000 mL (0 mLs Intravenous Stopped 04/23/16 2008)  ziprasidone (GEODON) capsule 20 mg (20 mg Oral Given 04/23/16 2128)  sodium chloride 0.9 % bolus 1,000 mL (0 mLs Intravenous Stopped 04/24/16 0545)  LORazepam (ATIVAN) injection 1 mg (1 mg Intravenous Given 04/24/16 0446)     Initial Impression / Assessment and Plan / ED Course  I have reviewed the triage vital signs and the nursing notes.  Pertinent labs & imaging results that were available during my care of the patient were reviewed by me and considered in my medical decision  making (see chart for details).  Clinical Course     The pt has what appears to be a psychosis - he is not functioning well at home and is having paranoid delusions of cults that are after him.  I have discussed his care with the family members as well as the psychiatric service and I feel strongly that the patient needs to be further evaluated in an inpatient unit.  He has tried to leave the hospital again, similar to earlier today - I have filled out commitment papers on him.  Family is in agreement.  Psychiatry to assist with placement  At change of shift - care signed out to Dr. Roxanne Mins oncoming EDP to follow up psych recommendations  Final Clinical Impressions(s) / ED Diagnoses   Final diagnoses:  Altered mental status, unspecified altered mental status type  Alcohol withdrawal syndrome without complication [facility])    New Prescriptions Discharge Medication List as of 04/24/2016  5:35 AM    START taking these medications   Details  LORazepam (ATIVAN) 1 MG tablet Take 1 tablet (1 mg total) by mouth every 6 (six) hours as needed for anxiety (or shakiness)., Starting Thu 04/24/2016, Print         Noemi Chapel, MD 04/24/16 1054

## 2016-04-23 NOTE — ED Notes (Signed)
Carles Collet, daughter called to state that pt has hx of a ruptured AAA and had emergent surgery a few years ago around 2014.  Pt was altered since yesterday, slurred speech, having trouble standing, sob.  Pt was recently treated for URI and does not know if he completed course of antibiotics.

## 2016-04-23 NOTE — ED Notes (Signed)
When out to speak to nephew, he states hes been confused, combative and started talking out of his head since yesterday.

## 2016-04-23 NOTE — ED Notes (Signed)
Pt given water and pack of crackers/peanut butter nabs.

## 2016-04-23 NOTE — ED Notes (Signed)
Barnett Applebaum, [facility] called to request Geodon capsule for this pt.

## 2016-04-23 NOTE — ED Triage Notes (Signed)
Pt seen earlier today, left AMA. Family brings pt back for eval of AMS.

## 2016-04-23 NOTE — ED Triage Notes (Signed)
Pt reports cp in center of chest radiating to jaw for over one month.  Pt poor historian of when his cp started.  Pt also mentioned he is weak on right side, when asking when it started he told me "you should know that".  I asked if it started when the cp started and he said "no its been longer than that." pt has no other accompanying symptoms.  Pt

## 2016-04-23 NOTE — Progress Notes (Signed)
Patient has been recommended inpatient treatment by Dr. Noemi Chapel on 04/23/16.  Referrals have been sent to the following hospitals: [facility], [facility], [place], [facility], [place], [place] [facility].  CSW will continue to seek placement.  Verlon Setting, [facility] Disposition staff 04/23/2016 10:06 PM

## 2016-04-23 NOTE — BH Assessment (Addendum)
Tele Assessment Note   Charles [name] is an 70 y.o. male who presents to [facility] ED accompanied by his daughter, Izyan Ezzell, who participated in assessment. Pt presented to ED with chest pain, was discharged AMA, and family brought back to ED for evaluation of altered mental status. Pt's daughter states that he has not been himself for the last 3 days, he has had increasing amounts of hallucinations, she feels that he is delirious talking about things that are not really happening, he thinks that someone shot his best friend in the head and shot his face off despite talking to him on the phone at the same time, he thinks that there is a cold that is after him.   Pt is agitated, belligerent and refused to answer some questions. He says he is in the ED because of chest pain. He denies feeling depressed or anxious. He acknowledges visual hallucinations and says he has seen things that are not right but he is unable to explain exactly what he is experiencing. He denies auditory hallucinations. He denies current suicidal ideation or history of suicide attempts. He denies current homicidal ideation or history of violence.  Pt reports that he drinks one half pint of liquor and two 40-ounce beers daily. He denies experiencing withdrawal symptoms, saying he always drinks at least a small amount of alcohol. Pt reports he also uses marijuana when available but has not had access recently. Pt's blood alcohol level is less than five and urine drug screen is negative.  Pt reports he lives with his nephew however Pt's family reports he lives alone. He says he completes all his ADL's independently. Pt's medical record indicates he has a history of peripheral vascular disease, encephalopathy, delirium and failure to thrive. He report he has never been hospitalized for mental health reasons but has received inpatient treatment for substance abuse in the past. Pt denies any current legal problems. He reports he has  access to a gun in his home.  Pt is dressed in hospital gown and appears disheveled. He is alert, oriented x4 with normal motor behavior. Speech is normal rate, slightly loud times and Pt mumbles at other times. Verbal content is aggressive with profanity. Eye contact is fair. Pt's mood is mood is angry and irritable; affect is congruent with mood. Thought process is coherent but at times tangential with paranoid content. Pt answers some questions appropriately but at other times will talk about completely unrelated topics. Pt states he does not want any mental health or substance abuse treatment and wants to go home and watch a football game.   Diagnosis: Unspecified Psychotic Disorder; Alcohol Use Disorder, Severe  Past Medical History:  Past Medical History:  Diagnosis Date  . AAA (abdominal aortic aneurysm) ([place])   . Collapsed lung    s/p fall, rib fracture  . Hypercholesteremia   . Hypertension   . Salmonella bacteremia     Past Surgical History:  Procedure Laterality Date  . ABDOMINAL AORTIC ANEURYSM REPAIR N/A 09/21/2012   Procedure: ANEURYSM ABDOMINAL AORTIC REPAIR;  Surgeon: Elam Dutch, MD;  Location: [place];  Service: Vascular;  Laterality: N/A;  . EMBOLECTOMY Right 09/21/2012   Procedure: EMBOLECTOMY;  Surgeon: Elam Dutch, MD;  Location: [place];  Service: Vascular;  Laterality: Right;  Embolectomy of right leg.  Marland Kitchen FEMORAL-POPLITEAL BYPASS GRAFT Right 09/30/2012   Procedure: BYPASS GRAFT FEMORAL-POPLITEAL ARTERY;  Surgeon: Conrad , MD;  Location: [place];  Service: Vascular;  Laterality: Right;  .  HERNIA REPAIR    . PATCH ANGIOPLASTY  09/21/2012   Procedure: PATCH ANGIOPLASTY;  Surgeon: Elam Dutch, MD;  Location: [facility] OR;  Service: Vascular;;  Hemashield Patch Angioplasty fo Right Common Femoral Artery  . PLEURAL SCARIFICATION     collapsed lung  . WOUND DEBRIDEMENT  09/24/2012   Procedure: Removal of Abdominal wound VAC, and Abdominal Closure;  Surgeon: Elam Dutch, MD;  Location: [facility] OR;  Service: Vascular;;    Family History:  Family History  Problem Relation Age of Onset  . Diabetes Mother   . Diabetes Father     Social History:  reports that he has been smoking Cigarettes.  He has been smoking about 0.50 packs per day. He has never used smokeless tobacco. He reports that he drinks alcohol. He reports that he does not use drugs.  Additional Social History:  Alcohol / Drug Use Pain Medications: Denies abuse Prescriptions: Denies abuse Over the Counter: Denies abuse History of alcohol / drug use?: Yes Longest period of sobriety (when/how long): Unknown Negative Consequences of Use:  (Pt denies) Withdrawal Symptoms:  (Pt denies) Substance #1 Name of Substance 1: Alcohol 1 - Age of First Use: Unknown 1 - Amount (size/oz): One half pint liquor and two 40-ounce beers 1 - Frequency: Daily 1 - Duration: Ongoing for years 1 - Last Use / Amount: 04/23/16 Substance #2 Name of Substance 2: Marijuana 2 - Age of First Use: Unknown 2 - Amount (size/oz): Unknown 2 - Frequency: Unknown 2 - Duration: Ongoing for years 2 - Last Use / Amount: Unknown  CIWA: CIWA-Ar BP: 139/80 Pulse Rate: 78 COWS:    PATIENT STRENGTHS: (choose at least two) Ability for insight Average or above average intelligence Communication skills General fund of knowledge Supportive family/friends  Allergies: No Known Allergies  Home Medications:  (Not in a hospital admission)  OB/GYN Status:  No LMP for male patient.  General Assessment Data Location of Assessment: [facility] TTS Assessment: In system Is this a Tele or Face-to-Face Assessment?: Tele Assessment Is this an Initial Assessment or a Re-assessment for this encounter?: Initial Assessment Marital status: Single Maiden name: NA Is patient pregnant?: No Pregnancy Status: No Living Arrangements: Other (Comment) (With nephew) Can pt return to current living arrangement?: Yes Admission Status:  Voluntary Is patient capable of signing voluntary admission?: Yes Referral Source: Self/Family/Friend Insurance type: Medicare     Crisis Care Plan Living Arrangements: Other (Comment) (With nephew) Legal Guardian: Other: (Self) Name of Psychiatrist: None Name of Therapist: None  Education Status Is patient currently in school?: No Current Grade: NA Highest grade of school patient has completed: Unknown Name of school: NA Contact person: NA  Risk to self with the past 6 months Suicidal Ideation: No Has patient been a risk to self within the past 6 months prior to admission? : No Suicidal Intent: No Has patient had any suicidal intent within the past 6 months prior to admission? : No Is patient at risk for suicide?: No Suicidal Plan?: No Has patient had any suicidal plan within the past 6 months prior to admission? : No Access to Means: No What has been your use of drugs/alcohol within the last 12 months?: Pt reports drinking alcohol daily Previous Attempts/Gestures: No How many times?: 0 Other Self Harm Risks: None Triggers for Past Attempts: None known Intentional Self Injurious Behavior: None Family Suicide History: Unknown Recent stressful life event(s): Other (Comment) (Medical complaints) Persecutory voices/beliefs?: No Depression: No Depression Symptoms: Feeling angry/irritable Substance  abuse history and/or treatment for substance abuse?: Yes Suicide prevention information given to non-admitted patients: Not applicable  Risk to Others within the past 6 months Homicidal Ideation: No Does patient have any lifetime risk of violence toward others beyond the six months prior to admission? : No Thoughts of Harm to Others: No Current Homicidal Intent: No Current Homicidal Plan: No Access to Homicidal Means: No Identified Victim: None History of harm to others?: No Assessment of Violence: None Noted Violent Behavior Description: Pt denies history of violence Does  patient have access to weapons?: Yes (Comment) (Pt reports gun in the home) Criminal Charges Pending?: No Does patient have a court date: No Is patient on probation?: No  Psychosis Hallucinations: Visual (Pt reports experiencing visual hallucinations) Delusions: Unspecified (Faily reports Pt is having delusional thoughts)  Mental Status Report Appearance/Hygiene: In hospital gown, Disheveled Eye Contact: Fair Motor Activity: Unremarkable Speech: Aggressive Level of Consciousness: Alert Mood: Angry Affect: Irritable, Angry Anxiety Level: Minimal Thought Processes: Coherent, Tangential Judgement: Impaired Orientation: Person, Place, Time, Situation Obsessive Compulsive Thoughts/Behaviors: None  Cognitive Functioning Concentration: Decreased Memory: Recent Intact, Remote Intact IQ: Average Insight: Poor Impulse Control: Fair Appetite: Good Weight Loss: 0 Weight Gain: 0 Sleep: No Change Total Hours of Sleep: 7 Vegetative Symptoms: None  ADLScreening [facility] Assessment Services) Patient's cognitive ability adequate to safely complete daily activities?: Yes Patient able to express need for assistance with ADLs?: Yes Independently performs ADLs?: Yes (appropriate for developmental age)  Prior Inpatient Therapy Prior Inpatient Therapy: Yes Prior Therapy Dates: Unknown Prior Therapy Facilty/Provider(s): Unknown Reason for Treatment: Substance abuse  Prior Outpatient Therapy Prior Outpatient Therapy: Yes Prior Therapy Dates: Unknown Prior Therapy Facilty/Provider(s): Unknown Reason for Treatment: Substance abuse Does patient have an ACCT team?: No Does patient have Intensive In-House Services?  : No Does patient have [facility] services? : No Does patient have P4CC services?: No  ADL Screening (condition at time of admission) Patient's cognitive ability adequate to safely complete daily activities?: Yes Is the patient deaf or have difficulty hearing?: No Does the patient  have difficulty seeing, even when wearing glasses/contacts?: No Does the patient have difficulty concentrating, remembering, or making decisions?: No Patient able to express need for assistance with ADLs?: Yes Does the patient have difficulty dressing or bathing?: No Independently performs ADLs?: Yes (appropriate for developmental age) Does the patient have difficulty walking or climbing stairs?: No Weakness of Legs: None Weakness of Arms/Hands: None       Abuse/Neglect Assessment (Assessment to be complete while patient is alone) Physical Abuse: Denies Verbal Abuse: Denies Sexual Abuse: Denies Exploitation of patient/patient's resources: Denies Self-Neglect: Denies     Regulatory affairs officer (For Healthcare) Does Patient Have a Medical Advance Directive?: No Would patient like information on creating a medical advance directive?: No - Patient declined    Additional Information 1:1 In Past 12 Months?: No CIRT Risk: No Elopement Risk: No Does patient have medical clearance?: Yes     Disposition: Inocencio Homes, AC at [facility], confirmed [address] is at capacity. Gave clinical report to Priscille Loveless, NP who said Pt does not meet criteria for IVC and recommends Pt be given outpatient referrals. Notified Dr. Noemi Chapel who said Pt does meet criteria for IVC due to paranoia and inability to care for himself. TTS will contact facilities for placement.   Disposition Initial Assessment Completed for this Encounter: Yes Disposition of Patient: Other dispositions Other disposition(s): Other (Comment)   Evelena Peat, [facility], [facility], [facility] Triage Specialist [phone]  Evelena Peat 04/23/2016 7:57 PM

## 2016-04-24 DIAGNOSIS — F10230 Alcohol dependence with withdrawal, uncomplicated: Secondary | ICD-10-CM | POA: Diagnosis not present

## 2016-04-24 MED ORDER — LORAZEPAM 2 MG/ML IJ SOLN
1.0000 mg | Freq: Once | INTRAMUSCULAR | Status: AC
  Administered 2016-04-24: 1 mg via INTRAVENOUS
  Filled 2016-04-24: qty 1

## 2016-04-24 MED ORDER — LORAZEPAM 1 MG PO TABS: 1.0000 mg | ORAL_TABLET | Freq: Four times a day (QID) | ORAL | 0 refills | Status: DC | PRN

## 2016-04-24 MED ORDER — SODIUM CHLORIDE 0.9 % IV BOLUS (SEPSIS)
1000.0000 mL | Freq: Once | INTRAVENOUS | Status: AC
  Administered 2016-04-24: 1000 mL via INTRAVENOUS

## 2016-04-24 NOTE — ED Notes (Signed)
Family remains at bedside- daughter Samara Deist, spoke with Dr Roxanne Mins- daughter informs me that Dr is planning on rescinding the IVC papers and discharging pt after bag of fluids has completed. Daughter says she will be taking pt home with her. Spoke with Dr Roxanne Mins and he confirmed that he will rescinding IVC papers and discharge pt to home.  This nurse asked Samara Deist had she spoke with the other family members [place] or [place]) and [place] stated, ("no but I am the oldest, so it doesn't matter").

## 2016-04-24 NOTE — ED Provider Notes (Signed)
4:33 AM Patient's daughter has arrived and states that he is back to his baseline mentation. He knows where he is, what holiday is coming up. Sitter states that she has known him in the past and he recognized her and knew her family members. He does have history of alcohol abuse and he does state that he had been drinking "a little bit" in the last few days. On reexam, he is mildly tremulous but he is oriented. He is angry about being here and is cursing. Family states this is normal for him. IVC will be rescinded. Family states that they wish to take him home. I expressed concern about alcohol withdrawal but we'll discharge with a prescription for lorazepam with the understanding that he is to return if his shakiness gets significantly worse. Family has requested that he get additional IV fluids and additional 1 L bolus is ordered.  5:35 AM He is resting comfortably without any tremulousness. He is discharged with prescription for the razor Pam. Family advised that he can return at any time if symptoms recur or worsen. Outpatient resources were provided.   Delora Fuel, MD 84/03/75 [id number]

## 2016-04-24 NOTE — ED Notes (Signed)
Charles [name], clinician with [facility] and informed her that the bed was being cancelled by Dr Roxanne Mins.

## 2016-04-24 NOTE — ED Notes (Signed)
Patient resting at this time. Equal rise and fall of chest. Appropriate behavior.

## 2016-04-24 NOTE — ED Notes (Addendum)
Pt daughter, Sholom Dulude, traveled from out of town. Informed daughter that she could visit for a short time with father since she is from out of town, however, this is an exception to the rules and can only be allowed to stay a short period of time since it is 4:00 am. Explained she can come back later in the morning during regular visiting hours. Daughter also spoke with Dr Roxanne Mins about having a repeat tele-psych for pt.

## 2016-04-24 NOTE — Discharge Instructions (Signed)
Return if you are having any problems.  Do not drink any alcohol!

## 2016-04-24 NOTE — ED Notes (Signed)
ED Provider at bedside.Dr Glick  

## 2016-04-25 ENCOUNTER — Encounter (HOSPITAL_COMMUNITY): Payer: Self-pay | Admitting: Emergency Medicine

## 2016-04-25 ENCOUNTER — Inpatient Hospital Stay (HOSPITAL_COMMUNITY)
Admission: EM | Admit: 2016-04-25 | Discharge: 2016-05-14 | DRG: 180 | Disposition: A | Discharge status: Skilled Nursing Facility | Payer: Medicare Other | Attending: Internal Medicine | Admitting: Internal Medicine

## 2016-04-25 DIAGNOSIS — R64 Cachexia: Secondary | ICD-10-CM | POA: Diagnosis present

## 2016-04-25 DIAGNOSIS — F22 Delusional disorders: Secondary | ICD-10-CM

## 2016-04-25 DIAGNOSIS — G939 Disorder of brain, unspecified: Secondary | ICD-10-CM

## 2016-04-25 DIAGNOSIS — J984 Other disorders of lung: Secondary | ICD-10-CM

## 2016-04-25 DIAGNOSIS — C3490 Malignant neoplasm of unspecified part of unspecified bronchus or lung: Secondary | ICD-10-CM

## 2016-04-25 DIAGNOSIS — I4891 Unspecified atrial fibrillation: Secondary | ICD-10-CM | POA: Diagnosis present

## 2016-04-25 DIAGNOSIS — Z833 Family history of diabetes mellitus: Secondary | ICD-10-CM | POA: Diagnosis not present

## 2016-04-25 DIAGNOSIS — R410 Disorientation, unspecified: Secondary | ICD-10-CM | POA: Diagnosis not present

## 2016-04-25 DIAGNOSIS — I739 Peripheral vascular disease, unspecified: Secondary | ICD-10-CM | POA: Diagnosis present

## 2016-04-25 DIAGNOSIS — R911 Solitary pulmonary nodule: Secondary | ICD-10-CM | POA: Diagnosis present

## 2016-04-25 DIAGNOSIS — I10 Essential (primary) hypertension: Secondary | ICD-10-CM | POA: Diagnosis present

## 2016-04-25 DIAGNOSIS — R918 Other nonspecific abnormal finding of lung field: Secondary | ICD-10-CM | POA: Diagnosis present

## 2016-04-25 DIAGNOSIS — Z681 Body mass index (BMI) 19 or less, adult: Secondary | ICD-10-CM

## 2016-04-25 DIAGNOSIS — Z9889 Other specified postprocedural states: Secondary | ICD-10-CM | POA: Diagnosis not present

## 2016-04-25 DIAGNOSIS — E46 Unspecified protein-calorie malnutrition: Secondary | ICD-10-CM | POA: Diagnosis present

## 2016-04-25 DIAGNOSIS — F0390 Unspecified dementia without behavioral disturbance: Secondary | ICD-10-CM | POA: Diagnosis present

## 2016-04-25 DIAGNOSIS — I713 Abdominal aortic aneurysm, ruptured, unspecified: Secondary | ICD-10-CM | POA: Diagnosis present

## 2016-04-25 DIAGNOSIS — E43 Unspecified severe protein-calorie malnutrition: Secondary | ICD-10-CM | POA: Diagnosis present

## 2016-04-25 DIAGNOSIS — D381 Neoplasm of uncertain behavior of trachea, bronchus and lung: Principal | ICD-10-CM | POA: Diagnosis present

## 2016-04-25 DIAGNOSIS — F17210 Nicotine dependence, cigarettes, uncomplicated: Secondary | ICD-10-CM | POA: Diagnosis present

## 2016-04-25 DIAGNOSIS — F1010 Alcohol abuse, uncomplicated: Secondary | ICD-10-CM | POA: Diagnosis present

## 2016-04-25 DIAGNOSIS — E7800 Pure hypercholesterolemia, unspecified: Secondary | ICD-10-CM | POA: Diagnosis present

## 2016-04-25 DIAGNOSIS — Z818 Family history of other mental and behavioral disorders: Secondary | ICD-10-CM | POA: Diagnosis not present

## 2016-04-25 DIAGNOSIS — C7931 Secondary malignant neoplasm of brain: Secondary | ICD-10-CM | POA: Diagnosis present

## 2016-04-25 DIAGNOSIS — Z7189 Other specified counseling: Secondary | ICD-10-CM

## 2016-04-25 DIAGNOSIS — Z515 Encounter for palliative care: Secondary | ICD-10-CM | POA: Diagnosis present

## 2016-04-25 DIAGNOSIS — E871 Hypo-osmolality and hyponatremia: Secondary | ICD-10-CM | POA: Diagnosis present

## 2016-04-25 DIAGNOSIS — IMO0001 Reserved for inherently not codable concepts without codable children: Secondary | ICD-10-CM

## 2016-04-25 DIAGNOSIS — R4182 Altered mental status, unspecified: Secondary | ICD-10-CM

## 2016-04-25 DIAGNOSIS — G936 Cerebral edema: Secondary | ICD-10-CM | POA: Diagnosis present

## 2016-04-25 DIAGNOSIS — F05 Delirium due to known physiological condition: Secondary | ICD-10-CM | POA: Diagnosis present

## 2016-04-25 DIAGNOSIS — D649 Anemia, unspecified: Secondary | ICD-10-CM | POA: Diagnosis present

## 2016-04-25 LAB — COMPREHENSIVE METABOLIC PANEL
ALBUMIN: 3.2 g/dL — AB (ref 3.5–5.0)
ALK PHOS: 53 U/L (ref 38–126)
ALT: 16 U/L — AB (ref 17–63)
AST: 30 U/L (ref 15–41)
Anion gap: 10 (ref 5–15)
BILIRUBIN TOTAL: 0.9 mg/dL (ref 0.3–1.2)
BUN: 13 mg/dL (ref 6–20)
CALCIUM: 8.8 mg/dL — AB (ref 8.9–10.3)
CO2: 25 mmol/L (ref 22–32)
CREATININE: 0.66 mg/dL (ref 0.61–1.24)
Chloride: 99 mmol/L — ABNORMAL LOW (ref 101–111)
GFR calc Af Amer: >60 mL/min (ref >60)
GFR calc non Af Amer: >60 mL/min (ref >60)
GLUCOSE: 104 mg/dL — AB (ref 65–99)
Potassium: 3.3 mmol/L — ABNORMAL LOW (ref 3.5–5.1)
Sodium: 134 mmol/L — ABNORMAL LOW (ref 135–145)
TOTAL PROTEIN: 7.1 g/dL (ref 6.5–8.1)

## 2016-04-25 LAB — CBC WITH DIFFERENTIAL/PLATELET
BASOS ABS: 0 10*3/uL (ref 0.0–0.1)
BASOS PCT: 0 %
Eosinophils Absolute: 0 10*3/uL (ref 0.0–0.7)
Eosinophils Relative: 0 %
HEMATOCRIT: 35 % — AB (ref 39.0–52.0)
HEMOGLOBIN: 11.6 g/dL — AB (ref 13.0–17.0)
LYMPHS PCT: 20 %
Lymphs Abs: 1.2 10*3/uL (ref 0.7–4.0)
MCH: 32.6 pg (ref 26.0–34.0)
MCHC: 33.1 g/dL (ref 30.0–36.0)
MCV: 98.3 fL (ref 78.0–100.0)
Monocytes Absolute: 1.1 10*3/uL — ABNORMAL HIGH (ref 0.1–1.0)
Monocytes Relative: 18 %
NEUTROS ABS: 3.6 10*3/uL (ref 1.7–7.7)
NEUTROS PCT: 62 %
Platelets: 149 10*3/uL — ABNORMAL LOW (ref 150–400)
RBC: 3.56 MIL/uL — ABNORMAL LOW (ref 4.22–5.81)
RDW: 13.6 % (ref 11.5–15.5)
WBC: 5.9 10*3/uL (ref 4.0–10.5)

## 2016-04-25 LAB — ETHANOL: Alcohol, Ethyl (B): <5 mg/dL (ref <5)

## 2016-04-25 MED ORDER — LORAZEPAM 2 MG/ML IJ SOLN
0.0000 mg | Freq: Two times a day (BID) | INTRAMUSCULAR | Status: DC
  Administered 2016-04-27: 2 mg via INTRAVENOUS
  Filled 2016-04-25: qty 1

## 2016-04-25 MED ORDER — THIAMINE HCL 100 MG/ML IJ SOLN
100.0000 mg | Freq: Once | INTRAMUSCULAR | Status: AC
  Administered 2016-04-25: 100 mg via INTRAVENOUS
  Filled 2016-04-25: qty 2

## 2016-04-25 MED ORDER — ACETAMINOPHEN 325 MG PO TABS
650.0000 mg | ORAL_TABLET | Freq: Once | ORAL | Status: AC
  Administered 2016-04-25: 650 mg via ORAL
  Filled 2016-04-25: qty 2

## 2016-04-25 MED ORDER — LORAZEPAM 2 MG/ML IJ SOLN
0.0000 mg | Freq: Four times a day (QID) | INTRAMUSCULAR | Status: DC
  Administered 2016-04-26: 1 mg via INTRAVENOUS
  Filled 2016-04-25: qty 1

## 2016-04-25 MED ORDER — SODIUM CHLORIDE 0.9 % IV BOLUS (SEPSIS)
500.0000 mL | Freq: Once | INTRAVENOUS | Status: AC
  Administered 2016-04-25: 500 mL via INTRAVENOUS

## 2016-04-25 MED ORDER — LORAZEPAM 2 MG/ML IJ SOLN
1.0000 mg | Freq: Once | INTRAMUSCULAR | Status: AC
  Administered 2016-04-25: 1 mg via INTRAVENOUS
  Filled 2016-04-25: qty 1

## 2016-04-25 NOTE — ED Notes (Signed)
Pt resting, sleeping on and off, has been cooperative with this nurse for the past 1- `1/2 hours. Will sign off on the officer release form. Hassan Rowan, RN charge nurse is aware and agrees with sign off.

## 2016-04-25 NOTE — BHH Counselor (Signed)
Pt's daughter called to ask about status of Pt's placement -- expressed unhappiness that Pt was not transferred to a [facility] unit where geripsych services could be provided.  Daughter also wanted to know if Pt would be provided a CAT scan and other medical questions. Advised daughter that I could not provide answers to medical questions, including what sort of care Pt may anticipate at a geripsych unit apart from psychiatric evaluation/care.  Daughter also asked about disposition of Pt after visit to geripsych unit. Advised that social worker would be able to provide answer.

## 2016-04-25 NOTE — ED Notes (Signed)
Pt's CIWA assessment done at 2030 but documented at 2325

## 2016-04-25 NOTE — ED Notes (Signed)
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  Advised her to speak with physician when he evaluated pt.

## 2016-04-25 NOTE — ED Notes (Signed)
TTS consult in progress currently

## 2016-04-25 NOTE — ED Notes (Signed)
Pt's daughter [name], states she can be contacted at [phone] or [phone].

## 2016-04-25 NOTE — Progress Notes (Signed)
Received notarized IVC papers, faxed to referral facilities. Earlyne Feeser K. Nash Shearer, LPC-A, [facility]  Counselor 04/25/2016 9:40 PM

## 2016-04-25 NOTE — BH Assessment (Signed)
Tele Assessment Note   Charles [name] is a 70 y.o. male who presented to [facility] under police escort after his daughter [name] ([phone]; [phone]) contacted police.  Daughter told police that Pt was experiencing increased agitation, hallucinations, and paranoia.  Pt is currently under IVC (petition filed by police).  Pt's daughter was not available in spite of repeated efforts to contact.    Pt was last assessed by TTS on 04/23/16.  At that time, Pt presented with daughter on a voluntary basis with complaint of altered mental status (hallucination, delirium, paranoia).  During that assessment, Pt endorsed daily use of alcohol (one 1/2 pint of liquor and two 40 oz beers per day) and visual hallucinations.  TTS attempted inpatient placement, but Pt left AMA.  Today Pt presented as agitated and angry at being brought to the hospital.  He denied auditory or visual hallucinations, suicidal ideation, homicidal ideation, and self-injury.  Pt endorsed continued use of alcohol and marijuana, with last use of alcohol being on 04/24/16.  Pt's BAL was not available at time of assessment.  "I'm fine.  I want to go home."  Per hospital staff, Pt was dressed in [place] clothing when he presented, and he had also soiled himself.  Pt was oriented to person and place, but he was not oriented to time ("It's 1999") and situation ("I don't know why I'm here").  Probed for paranoid ideation; Pt denied.  Per 04/23/16 assessment:  "Pt presented to ED with chest pain, was discharged AMA, and family brought back to ED for evaluation of altered mental status.  Pt's daughter states that he has not been himself for the last three days, he has had increasing amounts of hallucinations, she feels that he is delirious talking about things that are not really happened, he thinks that someone shot his best friend in the head and shot his face despite talking on the phone at the same time, he things that there is a cold  that is after him.... Pt's medical record indicates he has a history of peripheral vascular disease, encephalopathy, delirium, failure to thrive."  During assessment, Pt was dressed in street clothes and appeared disheveled.  He was alert and oriented x2.  Motor behavior indicated agitation.  Pt's demeanor was hostile to assessment.  Pt's mood was angry, and affect was congruent.  Pt denied suicidal ideation, homicidal ideation, auditory/visual hallucination, and self-injury.  Pt endorsed ongoing use of alcohol and marijuana.  Pt's speech was loud, occasionally incoherent, but otherwise normal in rhythm and rate.  Pt's thought processes were within normal range; thought content was goal-oriented.  Pt denied delusion.  Insight, judgment, and impulse control were deemed poor.  It is evident from hospital report that Pt struggles to care for self (see urination, soiling).  Consulted with Starleen Arms, NP, who recommended placement with geripsych unit and for possible evaluation for dementia.   Diagnosis: Substance Use Induced Psychosis/Mood Disorder; Alcohol Use Disorder  Past Medical History:  Past Medical History:  Diagnosis Date  . AAA (abdominal aortic aneurysm) ([place])   . Collapsed lung    s/p fall, rib fracture  . Hypercholesteremia   . Hypertension   . Salmonella bacteremia     Past Surgical History:  Procedure Laterality Date  . ABDOMINAL AORTIC ANEURYSM REPAIR N/A 09/21/2012   Procedure: ANEURYSM ABDOMINAL AORTIC REPAIR;  Surgeon: Elam Dutch, MD;  Location: [place];  Service: Vascular;  Laterality: N/A;  . EMBOLECTOMY Right 09/21/2012   Procedure: EMBOLECTOMY;  Surgeon: Elam Dutch, MD;  Location: [facility] OR;  Service: Vascular;  Laterality: Right;  Embolectomy of right leg.  Marland Kitchen FEMORAL-POPLITEAL BYPASS GRAFT Right 09/30/2012   Procedure: BYPASS GRAFT FEMORAL-POPLITEAL ARTERY;  Surgeon: Conrad Gardners, MD;  Location: [place];  Service: Vascular;  Laterality: Right;  . HERNIA REPAIR    . PATCH  ANGIOPLASTY  09/21/2012   Procedure: PATCH ANGIOPLASTY;  Surgeon: Elam Dutch, MD;  Location: [facility] OR;  Service: Vascular;;  Hemashield Patch Angioplasty fo Right Common Femoral Artery  . PLEURAL SCARIFICATION     collapsed lung  . WOUND DEBRIDEMENT  09/24/2012   Procedure: Removal of Abdominal wound VAC, and Abdominal Closure;  Surgeon: Elam Dutch, MD;  Location: [facility] OR;  Service: Vascular;;    Family History:  Family History  Problem Relation Age of Onset  . Diabetes Mother   . Diabetes Father     Social History:  reports that he has been smoking Cigarettes.  He has been smoking about 0.50 packs per day. He has never used smokeless tobacco. He reports that he drinks alcohol. He reports that he does not use drugs.  Additional Social History:  Alcohol / Drug Use Pain Medications: See PTA Prescriptions: See PTA Over the Counter: See PTA History of alcohol / drug use?: Yes Substance #1 Name of Substance 1: Alcohol 1 - Age of First Use: Unknown 1 - Amount (size/oz): One 1/2 pint liquor; 2 40 oz beers 1 - Frequency: Daily 1 - Duration: Ongoing 1 - Last Use / Amount: 04/24/16 (BAC not available at time of assessment) Substance #2 Name of Substance 2: Marijuana 2 - Age of First Use: unknown 2 - Amount (size/oz): Unknown 2 - Frequency: unknown 2 - Duration: Ongoing 2 - Last Use / Amount: unknown (UDS is clear)  CIWA: CIWA-Ar BP: 156/88 Pulse Rate: 101 Nausea and Vomiting: no nausea and no vomiting Tactile Disturbances: none Tremor: not visible, but can be felt fingertip to fingertip Auditory Disturbances:  (Pt denied, but per report, Pt has hallucinations) Paroxysmal Sweats: no sweat visible Visual Disturbances: very mild sensitivity Anxiety: moderately anxious, or guarded, so anxiety is inferred Headache, Fullness in Head: none present Agitation: somewhat more than normal activity Orientation and Clouding of Sensorium: disoriented for date by more than 2 calendar  days CIWA-Ar Total: 16 COWS:    PATIENT STRENGTHS: (choose at least two) Capable of independent living General fund of knowledge  Allergies: No Known Allergies  Home Medications:  (Not in a hospital admission)  OB/GYN Status:  No LMP for male patient.  General Assessment Data Location of Assessment: [facility] TTS Assessment: In system Is this a Tele or Face-to-Face Assessment?: Tele Assessment Is this an Initial Assessment or a Re-assessment for this encounter?: Initial Assessment Marital status: Single Is patient pregnant?: No Pregnancy Status: No Living Arrangements: Other (Comment) (Pt said he lives alone; on 11/22, he said he lives w/nephew) Can pt return to current living arrangement?: Yes Admission Status: Involuntary Is patient capable of signing voluntary admission?: Yes Referral Source: Self/Family/Friend (Daughter [place]) Insurance type: [place] MCR; [place] MCD     Crisis Care Plan Living Arrangements: Other (Comment) (Pt said he lives alone; on 11/22, he said he lives w/nephew) Name of Psychiatrist: None Name of Therapist: None  Education Status Is patient currently in school?: No Highest grade of school patient has completed: Unknown  Risk to self with the past 6 months Suicidal Ideation: No Has patient been a risk to self within the  past 6 months prior to admission? : No Suicidal Intent: No Has patient had any suicidal intent within the past 6 months prior to admission? : No Is patient at risk for suicide?: No Suicidal Plan?: No Has patient had any suicidal plan within the past 6 months prior to admission? : No Access to Means: No What has been your use of drugs/alcohol within the last 12 months?: Daily alcohol use Previous Attempts/Gestures: No How many times?: 0 Other Self Harm Risks: None Triggers for Past Attempts: None known Intentional Self Injurious Behavior: None Family Suicide History: Unknown Recent stressful life event(s): Recent negative physical  changes Persecutory voices/beliefs?: No Depression: No Depression Symptoms: Feeling angry/irritable Substance abuse history and/or treatment for substance abuse?: Yes Suicide prevention information given to non-admitted patients: Not applicable  Risk to Others within the past 6 months Homicidal Ideation: No Does patient have any lifetime risk of violence toward others beyond the six months prior to admission? : No Thoughts of Harm to Others: No Current Homicidal Intent: No Current Homicidal Plan: No Access to Homicidal Means: No History of harm to others?: No Assessment of Violence: None Noted Violent Behavior Description: Denied Does patient have access to weapons?: Yes (Comment) (Weapon at home) Criminal Charges Pending?: No Does patient have a court date: No Is patient on probation?: No  Psychosis Hallucinations: None noted (Pt denied; however, per report, Pt experienced 2 days ago) Delusions: None noted  Mental Status Report Appearance/Hygiene: Disheveled, Other (Comment) (Street clothes) Eye Contact: Good Motor Activity: Agitation Speech: Argumentative, Loud Level of Consciousness: Alert Mood: Angry Affect: Angry Anxiety Level: None Thought Processes: Tangential, Relevant Judgement: Impaired Orientation: Person, Place Obsessive Compulsive Thoughts/Behaviors: None  Cognitive Functioning Concentration: Decreased Memory: Recent Intact IQ: Average Insight: Poor Impulse Control: Poor Appetite: Good Sleep: No Change Total Hours of Sleep: 7 Vegetative Symptoms: None  ADLScreening [facility] Assessment Services) Patient's cognitive ability adequate to safely complete daily activities?: Yes Patient able to express need for assistance with ADLs?: Yes Independently performs ADLs?: Yes (appropriate for developmental age)  Prior Inpatient Therapy Prior Inpatient Therapy: Yes Prior Therapy Dates: Unknown Prior Therapy Facilty/Provider(s): Unknown Reason for Treatment:  Substance abuse  Prior Outpatient Therapy Prior Outpatient Therapy: Yes Prior Therapy Dates: Unknown Prior Therapy Facilty/Provider(s): Unknown Reason for Treatment: Substance abuse Does patient have an ACCT team?: No Does patient have Intensive In-House Services?  : No Does patient have [facility] services? : No Does patient have P4CC services?: No  ADL Screening (condition at time of admission) Patient's cognitive ability adequate to safely complete daily activities?: Yes Is the patient deaf or have difficulty hearing?: No Does the patient have difficulty seeing, even when wearing glasses/contacts?: No Does the patient have difficulty concentrating, remembering, or making decisions?: No Patient able to express need for assistance with ADLs?: Yes Does the patient have difficulty dressing or bathing?: No Independently performs ADLs?: Yes (appropriate for developmental age) Does the patient have difficulty walking or climbing stairs?: No Weakness of Legs: None Weakness of Arms/Hands: None  Home Assistive Devices/Equipment Home Assistive Devices/Equipment: None  Therapy Consults (therapy consults require a physician order) OT Evalulation Needed: No SLP Evaluation Needed: No Abuse/Neglect Assessment (Assessment to be complete while patient is alone) Physical Abuse: Denies Verbal Abuse: Denies Sexual Abuse: Denies Exploitation of patient/patient's resources: Denies Self-Neglect: Denies, provider concerned (Comment) (Pt presented with urine-soaked clothing; feces) Values / Beliefs Cultural Requests During Hospitalization: None Spiritual Requests During Hospitalization: None Consults Spiritual Care Consult Needed: No Social Work Consult Needed: No Advance  Directives (For Healthcare) Does Patient Have a Medical Advance Directive?: No Would patient like information on creating a medical advance directive?: No - Patient declined    Additional Information 1:1 In Past 12 Months?:  No CIRT Risk: No Elopement Risk: No Does patient have medical clearance?: Yes     Disposition:  Disposition Initial Assessment Completed for this Encounter: Yes Disposition of Patient: Inpatient treatment program Type of inpatient treatment program: Adult (Per L. Romilda Garret, NP, refer Pt to geripsych; possible dementia)  Marlowe Aschoff 04/25/2016 4:06 PM

## 2016-04-25 NOTE — ED Notes (Signed)
Pt given crackers, peanut butter, and apple juice.

## 2016-04-25 NOTE — Progress Notes (Signed)
Faxed IVC papers were not notarized.  Per Garry Heater, ED Secretary will be working on notarizing IVC papers for pt. Myracle Febres K. Nash Shearer, LPC-A, [facility]  Counselor 04/25/2016 8:37 PM

## 2016-04-25 NOTE — Progress Notes (Signed)
Spoke with ED secretary, will send IVC papers to [facility] [phone]. Awaiting IVC papers for Ger-Psych referral. Kindle Strohmeier K. Nash Shearer, LPC-A, [facility]  Counselor 04/25/2016 7:28 PM

## 2016-04-25 NOTE — Progress Notes (Signed)
Referral submitted to the following facilities for gero-psych: [facility], [place], [facility], [place], [place], [place], [facility], [place], [place], [facility], [place], and [place] K. Nash Shearer, LPC-A, [facility]  Counselor 04/25/2016 7:57 PM

## 2016-04-25 NOTE — ED Provider Notes (Signed)
[facility] DEPT [facility] Provider Note   CSN: [id number] Arrival date & time: 04/25/16  1057     History   Chief Complaint Chief Complaint  Patient presents with  . V70.1    HPI Charles [name] is a 70 y.o. male.  HPI Patient was discharged yesterday morning with concern for alcohol withdrawal. Mental status had returned to baseline. Had resolved tachycardia. Given Ativan. Patient we presents with his daughter. Had increasing agitation, hallucinations and paranoia throughout the day. Pressure over Ativan was not filled. Patient has not drank any alcohol that the daughter is aware of. Patient is not answering questions. Level V caveat applies. Past Medical History:  Diagnosis Date  . AAA (abdominal aortic aneurysm) ([place])   . Collapsed lung    s/p fall, rib fracture  . Hypercholesteremia   . Hypertension   . Salmonella bacteremia     Patient Active Problem List   Diagnosis Date Noted  . Peripheral vascular disease, unspecified 11/11/2012  . Encephalopathy, metabolic 38/18/2993  . Acute post-hemorrhagic anemia 10/11/2012  . Delirium 10/04/2012  . Salmonella bacteremia 09/25/2012  . Acute respiratory failure ([place]) 09/22/2012  . Hyponatremia 09/21/2012  . Adult failure to thrive 09/21/2012  . Dehydration 09/21/2012  . Generalized weakness 09/21/2012  . AAA (abdominal aortic aneurysm, ruptured) ([place]) 09/21/2012  . New onset atrial fibrillation ([place]) 09/21/2012  . Alcohol abuse 09/21/2012    Past Surgical History:  Procedure Laterality Date  . ABDOMINAL AORTIC ANEURYSM REPAIR N/A 09/21/2012   Procedure: ANEURYSM ABDOMINAL AORTIC REPAIR;  Surgeon: Elam Dutch, MD;  Location: [place];  Service: Vascular;  Laterality: N/A;  . EMBOLECTOMY Right 09/21/2012   Procedure: EMBOLECTOMY;  Surgeon: Elam Dutch, MD;  Location: [place];  Service: Vascular;  Laterality: Right;  Embolectomy of right leg.  Marland Kitchen FEMORAL-POPLITEAL BYPASS GRAFT Right 09/30/2012   Procedure: BYPASS GRAFT  FEMORAL-POPLITEAL ARTERY;  Surgeon: Conrad Mercer, MD;  Location: [place];  Service: Vascular;  Laterality: Right;  . HERNIA REPAIR    . PATCH ANGIOPLASTY  09/21/2012   Procedure: PATCH ANGIOPLASTY;  Surgeon: Elam Dutch, MD;  Location: [facility] OR;  Service: Vascular;;  Hemashield Patch Angioplasty fo Right Common Femoral Artery  . PLEURAL SCARIFICATION     collapsed lung  . WOUND DEBRIDEMENT  09/24/2012   Procedure: Removal of Abdominal wound VAC, and Abdominal Closure;  Surgeon: Elam Dutch, MD;  Location: [place];  Service: Vascular;;       Home Medications    Prior to Admission medications   Medication Sig Start Date End Date Taking? Authorizing Provider  acetaminophen (TYLENOL) 325 MG tablet Take 1-2 tablets (325-650 mg total) by mouth every 4 (four) hours as needed. Patient taking differently: Take 325-650 mg by mouth every 4 (four) hours as needed for mild pain.  10/18/12   Bary Leriche, PA-C  ibuprofen (ADVIL,MOTRIN) 200 MG tablet Take 800 mg by mouth every 6 (six) hours as needed.    Historical Provider, MD  LORazepam (ATIVAN) 1 MG tablet Take 1 tablet (1 mg total) by mouth every 6 (six) hours as needed for anxiety (or shakiness). 71/69/67   Delora Fuel, MD    Family History Family History  Problem Relation Age of Onset  . Diabetes Mother   . Diabetes Father     Social History Social History  Substance Use Topics  . Smoking status: Current Every Day Smoker    Packs/day: 0.50    Types: Cigarettes  . Smokeless tobacco: Never Used  .  Alcohol use Yes     Comment: Daily - 1/2 pt liquor; 2 40 oz beers -- per hx     Allergies   Patient has no known allergies.   Review of Systems Review of Systems  Unable to perform ROS: Psychiatric disorder     Physical Exam Updated Vital Signs BP 144/82   Pulse 76   Temp 98.9 F (37.2 C) (Oral)   Resp 18   Ht '5\' 6"'$  (1.676 m)   Wt 100 lb (45.4 kg)   SpO2 98%   BMI 16.14 kg/m   Physical Exam  Constitutional: He appears  well-developed and well-nourished.  HENT:  Head: Normocephalic and atraumatic.  Tremulous lips and tongue. No obvious head injury.  Eyes: EOM are normal. Pupils are equal, round, and reactive to light.  Neck: Normal range of motion. Neck supple.  No meningismus  Cardiovascular: Regular rhythm.   Tachycardia  Pulmonary/Chest: Effort normal and breath sounds normal.  Abdominal: Soft. Bowel sounds are normal. There is no tenderness. There is no rebound and no guarding.  Musculoskeletal: Normal range of motion. He exhibits no edema or tenderness.  Neurological:  Appears to be moving all extremities. Fine tremor.  Skin: Skin is warm and dry. No rash noted. No erythema.  Psychiatric:  Agitation and aggression. Poor insight.  Nursing note and vitals reviewed.    ED Treatments / Results  Labs (all labs ordered are listed, but only abnormal results are displayed) Labs Reviewed  CBC WITH DIFFERENTIAL/PLATELET - Abnormal; Notable for the following:       Result Value   RBC 3.56 (*)    Hemoglobin 11.6 (*)    HCT 35.0 (*)    Platelets 149 (*)    Monocytes Absolute 1.1 (*)    All other components within normal limits  COMPREHENSIVE METABOLIC PANEL - Abnormal; Notable for the following:    Sodium 134 (*)    Potassium 3.3 (*)    Chloride 99 (*)    Glucose, Bld 104 (*)    Calcium 8.8 (*)    Albumin 3.2 (*)    ALT 16 (*)    All other components within normal limits  ETHANOL  AMMONIA  RAPID URINE DRUG SCREEN, HOSP PERFORMED    EKG  EKG Interpretation None       Radiology Ct Head Wo Contrast  Result Date: 04/26/2016 CLINICAL DATA:  70 y/o  M; altered mental status. EXAM: CT HEAD WITHOUT CONTRAST TECHNIQUE: Contiguous axial images were obtained from the base of the skull through the vertex without intravenous contrast. COMPARISON:  None. FINDINGS: Brain: No evidence for large acute territory infarct, focal mass effect, or intracranial hemorrhage. Foci of hypoattenuation in  subcortical and periventricular white matter are compatible with moderate chronic microvascular ischemic changes. Lucency in the right cerebellar hemisphere and bilateral lentiform nuclei probably represent chronic lacunar infarcts. Mild brain parenchymal volume loss. Normal ventricle size. No extra-axial collection. Vascular: No hyperdense vessel. Extensive calcific atherosclerosis of V4 segments and carotid siphons. Skull: Normal. Negative for fracture or focal lesion. Sinuses/Orbits: Opacification of right maxillary sinus with chronic inflammatory changes of the walls. Partial opacification of right anterior ethmoid air cells. Otherwise the visualized paranasal sinuses and mastoid air cells are normally aerated. Chronic right lamina papyracea fracture. Other: None. IMPRESSION: 1. No evidence for large acute infarct, focal mass effect, or intracranial hemorrhage. 2. Background of moderate chronic microvascular ischemic changes and mild parenchymal volume loss. 3. Right maxillary chronic sinusitis. Electronically Signed   By: Mia [place]  [place] M.D.   On: 04/26/2016 02:40    Procedures Procedures (including critical care time)  Medications Ordered in ED Medications  LORazepam (ATIVAN) injection 0-4 mg (1 mg Intravenous Given 04/26/16 1013)    Followed by  LORazepam (ATIVAN) injection 0-4 mg (not administered)  LORazepam (ATIVAN) injection 1 mg (1 mg Intravenous Given 04/25/16 1339)  sodium chloride 0.9 % bolus 500 mL (0 mLs Intravenous Stopped 04/25/16 1440)  thiamine (B-1) injection 100 mg (100 mg Intravenous Given 04/25/16 1338)  acetaminophen (TYLENOL) tablet 650 mg (650 mg Oral Given 04/25/16 2340)  diphenhydrAMINE (BENADRYL) capsule 25 mg (25 mg Oral Given 04/26/16 0340)     Initial Impression / Assessment and Plan / ED Course  I have reviewed the triage vital signs and the nursing notes.  Pertinent labs & imaging results that were available during my care of the patient were  reviewed by me and considered in my medical decision making (see chart for details).  Clinical Course    Concern for alcohol withdrawal. We'll place on CIWA and will likely needInpatient treatment. Will consult TTS.   Final Clinical Impressions(s) / ED Diagnoses   Final diagnoses:  None    New Prescriptions New Prescriptions   No medications on file     Julianne Rice, MD 04/26/16 1430

## 2016-04-25 NOTE — ED Triage Notes (Signed)
Pt here for emergency commitment. Per officer family had called EMS for transport yesterday but pt was pushing EMS personnel and became combative. Pt threatening to kill people. Pt confused and cursing, incontinent of urine.

## 2016-04-26 ENCOUNTER — Emergency Department (HOSPITAL_COMMUNITY): Payer: Medicare Other

## 2016-04-26 LAB — AMMONIA: AMMONIA: 28 umol/L (ref 9–35)

## 2016-04-26 MED ORDER — ACETAMINOPHEN 325 MG PO TABS
ORAL_TABLET | ORAL | Status: AC
  Administered 2016-04-26: 650 mg via ORAL
  Filled 2016-04-26: qty 2

## 2016-04-26 MED ORDER — ACETAMINOPHEN 325 MG PO TABS
650.0000 mg | ORAL_TABLET | Freq: Four times a day (QID) | ORAL | Status: DC | PRN
  Administered 2016-04-26 – 2016-05-01 (×15): 650 mg via ORAL
  Filled 2016-04-26 (×14): qty 2

## 2016-04-26 MED ORDER — DIPHENHYDRAMINE HCL 25 MG PO CAPS
25.0000 mg | ORAL_CAPSULE | Freq: Once | ORAL | Status: AC
  Administered 2016-04-26: 25 mg via ORAL
  Filled 2016-04-26: qty 1

## 2016-04-26 NOTE — ED Notes (Signed)
Patient requesting something for pain. Offered patient Tylenol. Patient refused. Stated he wanted "some real pain medication".

## 2016-04-26 NOTE — ED Notes (Signed)
ED Provider at bedside. 

## 2016-04-26 NOTE — ED Notes (Signed)
Offered patient Tylenol for pain. Patient stated to writer "I dont want no damn tylenol, get the fuck out my room."

## 2016-04-26 NOTE — ED Notes (Signed)
Spoke with patients daughter and informed her patient was resting. Informed her of visiting hours this evening.

## 2016-04-26 NOTE — ED Notes (Addendum)
Pt getting more agitated, has been calm and cooperative through out the night, started getting aggravated approx. 15 minutes, cussing, demanding food. When this nurse entered room to inform pt that the food tray had been ordered, pt told me to shut up and get out, continuing to curse and be verbally aggressive. Pt did not make any threats to this nurse, just rambling on about "wanting food in this damn place."

## 2016-04-26 NOTE — ED Notes (Signed)
Patient came to nurses desk yelling and stating he needs something for his back pain. Patient redirected back to room. Sitter present.

## 2016-04-26 NOTE — ED Notes (Addendum)
Spoke with pt oldest daughter, Vanetta Mulders and informed her that pt is stable and calm, still confused at times, but doing alright. Daughter requested that we call if pt moves to another facility and ok to leave message. Informed daughter that we would notify her if pt was going to be moved to another facility.

## 2016-04-26 NOTE — ED Notes (Signed)
Pt returned from CT °

## 2016-04-26 NOTE — Progress Notes (Signed)
Followed up on inpatient referral efforts. TTS recommended geri-psych inpatient treatment for pt on 04/25/16.   Pt has referrals pending at: [place] at: [facility] ([facility] and pt is out of catchment area) [facility]- due to medical acuity [facility]- due to etoh use [facility]- must have dementia/Alzeihmer's dx for geri-psych unit  Was unable to reach [place] intake to determine status of referral but will continue to contact.  Other facilities contacted are at capacity.  Sharren Bridge, MSW, LCSW Clinical Social Work, Disposition  04/26/2016 [phone]

## 2016-04-26 NOTE — BH Assessment (Signed)
04/26/16 1103.  TC charge Therapist, sports at [employer].  TTS discussed doing reassessment with pt.  Charge RN reports pt was agitated this AM but only because he wanted his breakfast.  Once fed, pt has been fine.  Pt is asleep at this time and they would prefer to not wake right now for reassessment.  Will try again later. Lurline Idol, LCSW

## 2016-04-27 MED ORDER — LORAZEPAM 1 MG PO TABS
1.0000 mg | ORAL_TABLET | Freq: Once | ORAL | Status: AC
  Administered 2016-04-28: 1 mg via ORAL
  Filled 2016-04-27: qty 1

## 2016-04-27 MED ORDER — IBUPROFEN 400 MG PO TABS
400.0000 mg | ORAL_TABLET | Freq: Once | ORAL | Status: AC
  Administered 2016-04-28: 400 mg via ORAL
  Filled 2016-04-27: qty 1

## 2016-04-27 NOTE — BHH Counselor (Signed)
Attempted to reassess Pt; Pt refused to speak with author.  Per attending nurse, Pt was calm today, but yesterday he was agitated at one pint, and also he was not able to recognize a daughter who came to visit him yesterday.

## 2016-04-27 NOTE — ED Notes (Signed)
Pt requesting Ativan.

## 2016-04-27 NOTE — ED Provider Notes (Signed)
Pt awaiting placement He is up in room walking around He reports body soreness Labs reviewed Vitals reviewed BP 150/95 (BP Location: Right Arm)   Pulse 74   Temp 98.5 F (36.9 C) (Oral)   Resp 18   Ht '5\' 6"'$  (1.676 m)   Wt 45.4 kg   SpO2 98%   BMI 16.14 kg/m     Ripley Fraise, MD 04/27/16 2327

## 2016-04-27 NOTE — ED Notes (Signed)
Pt's daughter called for update.

## 2016-04-27 NOTE — ED Notes (Signed)
Pt awake.  Refuses Ativan at this time.

## 2016-04-27 NOTE — Progress Notes (Signed)
Followed up on inpatient referral efforts for pt (at recommendation of tts assessment 11/24)  Referrals pending at: [facility]- intake advised re-fax latest information  [facility])- left voicemail requesting status of referral sent yesterday [facility] advises unit full and not expecting beds until 11/28- advises call back then and fax latest information Elon Jester, intake RN, advises Dr. Yehuda Savannah reviewed referral and "feels that this may be etoh withdrawal symptoms only." Advised that if pt still needing placement 11/27 and beds available, will re-consider based on information present at that time.  Declined at: [facility] (is a [facility] hospital and not within catchment area),  [facility] (medical acuity),  [facility] ([facility] abuse),  [facility] (must have dementia dx for unit), [facility] (medical acuity),  [facility] (medical),  [facility] (medical)  Sharren Bridge, MSW, LCSW Clinical Social Work, Disposition  04/27/2016 [phone]

## 2016-04-28 MED ORDER — LORAZEPAM 1 MG PO TABS
0.0000 mg | ORAL_TABLET | Freq: Four times a day (QID) | ORAL | Status: DC
  Administered 2016-04-29 – 2016-04-30 (×4): 1 mg via ORAL
  Filled 2016-04-28: qty 1
  Filled 2016-04-28: qty 2
  Filled 2016-04-28 (×2): qty 1

## 2016-04-28 MED ORDER — LORAZEPAM 1 MG PO TABS
ORAL_TABLET | ORAL | Status: AC
  Filled 2016-04-28: qty 1

## 2016-04-28 MED ORDER — LORAZEPAM 1 MG PO TABS
1.0000 mg | ORAL_TABLET | Freq: Once | ORAL | Status: AC
  Administered 2016-04-28: 1 mg via ORAL

## 2016-04-28 MED ORDER — LORAZEPAM 1 MG PO TABS
0.0000 mg | ORAL_TABLET | Freq: Two times a day (BID) | ORAL | Status: DC
Start: 2016-04-30 — End: 2016-05-02
  Administered 2016-04-30 – 2016-05-01 (×3): 1 mg via ORAL
  Filled 2016-04-28 (×3): qty 1

## 2016-04-28 MED ORDER — ONDANSETRON 4 MG PO TBDP
4.0000 mg | ORAL_TABLET | Freq: Once | ORAL | Status: AC
  Administered 2016-04-28: 4 mg via ORAL
  Filled 2016-04-28: qty 1

## 2016-04-28 MED ORDER — LORAZEPAM 1 MG PO TABS
2.0000 mg | ORAL_TABLET | Freq: Once | ORAL | Status: AC
  Administered 2016-04-28: 2 mg via ORAL
  Filled 2016-04-28: qty 2

## 2016-04-28 NOTE — ED Notes (Signed)
Pt consumed small container of ice cream and a few bites of meat on lunch tray.  Pt told sitter he didn't want his food because he was worried we were poisoning him. Reassured that he is not being poisoned.   Pt states he may would eat a sandwich.  Pt given sandwich and refused to eat.

## 2016-04-28 NOTE — ED Notes (Signed)
Pt ate serving of rice and chocolate chip cookie

## 2016-04-28 NOTE — Progress Notes (Addendum)
Patient scheduled for tele-psych assessment today to determine improvement in symptoms. Charles [name] was very agitated during the assessment refusing to engage writer and covering up his head with blanket. Patient was noted to use profanity towards Probation officer numerous times. He did state "Get off the screen. Just let me starve to death. That is my plan. Leave me alone." Notes from nursing indicate that the patient may be paranoid as daughter initially reported through refusal of food. His mental status is unclear due to his unwillingness to participate in today's psychiatric assessment. The patient has been treated with ativan due to concern that his delirium was related to alcohol abuse. His current vitals are noted to be stable. Discussed case with TTS team and placement will continue to be sought. His CIWA this morning was an eight. Patient per review of MAR has been refusing doses of scheduled ativan. However, his vital signs appear stable after several days of monitoring in the ED. Patient will need gero-psych placement to rule out neurocognitive disorder.

## 2016-04-28 NOTE — ED Notes (Signed)
Pt c/o nausea.  EDP notified

## 2016-04-28 NOTE — Progress Notes (Signed)
Pt remains on [facility] waiting list- per Jenny Reichmann, no male beds today. [place] per Gerald Stabs, no beds today but advised send updated information and pt will continue to be considered for admission (as of 11/26 admitting MD Dr. Yehuda Savannah expressed concern that pt was experiencing etoh withdrawa sx rather than psychiatric symptoms but will continue to review based on today's evaluation)  Sharren Bridge, MSW, [place] Work, Disposition  04/28/2016 [phone]

## 2016-04-29 DIAGNOSIS — F17210 Nicotine dependence, cigarettes, uncomplicated: Secondary | ICD-10-CM | POA: Diagnosis not present

## 2016-04-29 DIAGNOSIS — Z833 Family history of diabetes mellitus: Secondary | ICD-10-CM | POA: Diagnosis not present

## 2016-04-29 DIAGNOSIS — Z9889 Other specified postprocedural states: Secondary | ICD-10-CM | POA: Diagnosis not present

## 2016-04-29 DIAGNOSIS — R45851 Suicidal ideations: Secondary | ICD-10-CM

## 2016-04-29 DIAGNOSIS — R410 Disorientation, unspecified: Secondary | ICD-10-CM | POA: Diagnosis not present

## 2016-04-29 LAB — BASIC METABOLIC PANEL
Anion gap: 5 (ref 5–15)
BUN: 16 mg/dL (ref 6–20)
CALCIUM: 8.2 mg/dL — AB (ref 8.9–10.3)
CHLORIDE: 101 mmol/L (ref 101–111)
CO2: 26 mmol/L (ref 22–32)
CREATININE: 0.61 mg/dL (ref 0.61–1.24)
GFR calc non Af Amer: >60 mL/min (ref >60)
Glucose, Bld: 97 mg/dL (ref 65–99)
Potassium: 3.8 mmol/L (ref 3.5–5.1)
SODIUM: 132 mmol/L — AB (ref 135–145)

## 2016-04-29 LAB — TROPONIN I: Troponin I: <0.03 ng/mL (ref <0.03)

## 2016-04-29 MED ORDER — ONDANSETRON 4 MG PO TBDP
4.0000 mg | ORAL_TABLET | Freq: Once | ORAL | Status: AC
  Administered 2016-04-29: 4 mg via ORAL
  Filled 2016-04-29: qty 1

## 2016-04-29 NOTE — ED Notes (Signed)
Patient complaining of chest pain upon awakening. Obtained EKG and gave to Dr Reather Converse. Verbal order for troponin and BMP.

## 2016-04-29 NOTE — ED Notes (Signed)
Charles [name], sitter fixed pt a food tray and went in to give to pt, pt stated to Jackelyn Poling that he did not want to eat, he wanted to starve so he could die

## 2016-04-29 NOTE — Progress Notes (Signed)
Rod Holler at [employer] called requesting updated vitals and notes for pt during last 24 hours. Faxed requested documents. Rod Holler states admitting provider will review for when appropriate bed opens.  Sharren Bridge, MSW, LCSW Clinical Social Work, Disposition  04/29/2016 [phone]

## 2016-04-29 NOTE — ED Notes (Signed)
Pt taken to shower by Security and NT. Bed linen and room cleaned by ED staff.

## 2016-04-29 NOTE — ED Notes (Signed)
Patient's daughter called for update on patient. Advised daughter patient is still waiting for placement. Family member verbalized understanding.

## 2016-04-29 NOTE — ED Notes (Signed)
Patient ate entire meal tray.

## 2016-04-29 NOTE — Consult Note (Signed)
Telepsych Consultation   Reason for Consult:  Confusion  Referring Physician: EDP Patient Identification: Charles [name] MRN:  [id number] Principal Diagnosis: Delirium Diagnosis:   Patient Active Problem List   Diagnosis Date Noted  . Peripheral vascular disease, unspecified [I73.9] 11/11/2012  . Encephalopathy, metabolic [I68.03] 21/22/4825  . Acute post-hemorrhagic anemia [D62] 10/11/2012  . Delirium [R41.0] 10/04/2012  . Salmonella bacteremia [R78.81] 09/25/2012  . Acute respiratory failure ([place]) [J96.00] 09/22/2012  . Hyponatremia [E87.1] 09/21/2012  . Adult failure to thrive [R62.7] 09/21/2012  . Dehydration [E86.0] 09/21/2012  . Generalized weakness [R53.1] 09/21/2012  . AAA (abdominal aortic aneurysm, ruptured) ([place]) [I71.3] 09/21/2012  . New onset atrial fibrillation ([place]) [I48.91] 09/21/2012  . Alcohol abuse [F10.10] 09/21/2012    Total Time spent with patient: 15 minutes  Subjective:   Charles [name] is a 70 y.o. male patient admitted with increased agitation, hallucinations, and paranoia.    HPI:    Per initial Tele Assessment Note:   Charles [name] is a 70 y.o. male who presented to [place] under police escort after his daughter Charles [name]  contacted police.  Daughter told police that Pt was experiencing increased agitation, hallucinations, and paranoia.  Pt is currently under IVC (petition filed by police).  Pt's daughter was not available in spite of repeated efforts to contact.    Pt was last assessed by TTS on 04/23/16.  At that time, Pt presented with daughter on a voluntary basis with complaint of altered mental status (hallucination, delirium, paranoia).  During that assessment, Pt endorsed daily use of alcohol (one 1/2 pint of liquor and two 40 oz beers per day) and visual hallucinations.  TTS attempted inpatient placement, but Pt left AMA.  Patient seen for evaluation today 04/29/2016 as the patient was too agitated to participate yesterday. Charles [name]  was hostile but more cooperative than yesterday. He appeared very irritable stating "Not you trying to talk to me again." The patient was difficult to follow during assessment due to rambling speech. Patient did make several comments potentially suggestive of psychosis "People are shooting at me. The Black Panther's are part of it." When asked to repeat himself the patient rolled over in the bed and stated "You know what I am talking about." The patient's mood remains very irritable. Charles [name] ruminates about conditions at home stating "I pee all over myself. Nobody cares. I can't go back there." The patient would not discuss his previous alcohol use. Since admission his symptoms of alcohol withdrawal appear to have stabilized with doses of ativan. Patient has been referred to gero-psych to rule out dementia and also for possible treatment of depressive symptoms. Yesterday the patient made a statement about wanting to starve to death. Patient continues to endorse depressive symptoms but refuses to elaborate due to his labile mood.   Past Psychiatric History: Alcohol abuse   Risk to Self: Suicidal Ideation: Yes Suicidal Intent: Yes plan to starve himself or "go out into the woods."  Is patient at risk for suicide?: No Suicidal Plan?: No Access to Means: No What has been your use of drugs/alcohol within the last 12 months?: Daily alcohol use How many times?: 0 Other Self Harm Risks: None Triggers for Past Attempts: None known Intentional Self Injurious Behavior: None Risk to Others: Homicidal Ideation: No Thoughts of Harm to Others: No Current Homicidal Intent: No Current Homicidal Plan: No Access to Homicidal Means: No History of harm to others?: No Assessment of Violence: None Noted Violent Behavior Description: Denied Does patient  have access to weapons?: Yes (Comment) (Weapon at home) Criminal Charges Pending?: No Does patient have a court date: No Prior Inpatient Therapy: Prior Inpatient  Therapy: Yes Prior Therapy Dates: Unknown Prior Therapy Facilty/Provider(s): Unknown Reason for Treatment: Substance abuse Prior Outpatient Therapy: Prior Outpatient Therapy: Yes Prior Therapy Dates: Unknown Prior Therapy Facilty/Provider(s): Unknown Reason for Treatment: Substance abuse Does patient have an ACCT team?: No Does patient have Intensive In-House Services?  : No Does patient have [facility] services? : No Does patient have P4CC services?: No  Past Medical History:  Past Medical History:  Diagnosis Date  . AAA (abdominal aortic aneurysm) ([place])   . Collapsed lung    s/p fall, rib fracture  . Hypercholesteremia   . Hypertension   . Salmonella bacteremia     Past Surgical History:  Procedure Laterality Date  . ABDOMINAL AORTIC ANEURYSM REPAIR N/A 09/21/2012   Procedure: ANEURYSM ABDOMINAL AORTIC REPAIR;  Surgeon: Elam Dutch, MD;  Location: [facility];  Service: Vascular;  Laterality: N/A;  . EMBOLECTOMY Right 09/21/2012   Procedure: EMBOLECTOMY;  Surgeon: Elam Dutch, MD;  Location: [facility];  Service: Vascular;  Laterality: Right;  Embolectomy of right leg.  Marland Kitchen FEMORAL-POPLITEAL BYPASS GRAFT Right 09/30/2012   Procedure: BYPASS GRAFT FEMORAL-POPLITEAL ARTERY;  Surgeon: Conrad [place], MD;  Location: [facility];  Service: Vascular;  Laterality: Right;  . HERNIA REPAIR    . PATCH ANGIOPLASTY  09/21/2012   Procedure: PATCH ANGIOPLASTY;  Surgeon: Elam Dutch, MD;  Location[facility] OR;  Service: Vascular;;  [place]em[place] Patch Angioplasty fo Right Common Femoral Artery  . PLEURAL SCARIFICATION     collapsed lung  . WOUND DEBRIDEMENT  09/24/2012   Procedure: Removal of Abdominal wound VAC, and Abdominal Closure;  Surgeon: Elam Dutch, MD;  Location: [facility] OR;  Service: Vascular;;   Family History:  Family History  Problem Relation Age of Onset  . Diabetes Mother   . Diabetes Father    Family Psychiatric  History: Patient refused to answer.  Social History:  History  Alcohol Use   . Yes    Comment: Daily - 1/2 pt liquor; 2 40 oz beers -- per hx     History  Drug Use No    Social History   Social History  . Marital status: Single    Spouse name: N/A  . Number of children: N/A  . Years of education: N/A   Social History Main Topics  . Smoking status: Current Every Day Smoker    Packs/day: 0.50    Types: Cigarettes  . Smokeless tobacco: Never Used  . Alcohol use Yes     Comment: Daily - 1/2 pt liquor; 2 40 oz beers -- per hx  . Drug use: No  . Sexual activity: No   Other Topics Concern  . None   Social History Narrative  . None   Additional Social History:    Allergies:  No Known Allergies  Labs:  Results for orders placed or performed during the hospital encounter of 04/25/16 (from the past 48 hour(s))  Basic metabolic panel     Status: Abnormal   Collection Time: 04/29/16  3:06 PM  Result Value Ref Range   Sodium 132 (L) 135 - 145 mmol/L   Potassium 3.8 3.5 - 5.1 mmol/L   Chloride 101 101 - 111 mmol/L   CO2 26 22 - 32 mmol/L   Glucose, Bld 97 65 - 99 mg/dL   BUN 16 6 - 20 mg/dL  Creatinine, Ser 0.61 0.61 - 1.24 mg/dL   Calcium 8.2 (L) 8.9 - 10.3 mg/dL   GFR calc non Af Amer >60 >60 mL/min   GFR calc Af Amer >60 >60 mL/min    Comment: (NOTE) The eGFR has been calculated using the CKD EPI equation. This calculation has not been validated in all clinical situations. eGFR's persistently <60 mL/min signify possible Chronic Kidney Disease.    Anion gap 5 5 - 15  Troponin I     Status: None   Collection Time: 04/29/16  3:06 PM  Result Value Ref Range   Troponin I <0.03 <0.03 ng/mL    Current Facility-Administered Medications  Medication Dose Route Frequency Provider Last Rate Last Dose  . acetaminophen (TYLENOL) tablet 650 mg  650 mg Oral Q6H PRN Noemi Chapel, MD   650 mg at 04/29/16 0912  . LORazepam (ATIVAN) tablet 0-4 mg  0-4 mg Oral Q6H Fredia Sorrow, MD   1 mg at 04/29/16 0300   Followed by  . [START ON 04/30/2016]  LORazepam (ATIVAN) tablet 0-4 mg  0-4 mg Oral Q12H Fredia Sorrow, MD       Current Outpatient Prescriptions  Medication Sig Dispense Refill  . acetaminophen (TYLENOL) 325 MG tablet Take 1-2 tablets (325-650 mg total) by mouth every 4 (four) hours as needed. (Patient taking differently: Take 325-650 mg by mouth every 4 (four) hours as needed for mild pain. )    . ibuprofen (ADVIL,MOTRIN) 200 MG tablet Take 800 mg by mouth every 6 (six) hours as needed.    Marland Kitchen LORazepam (ATIVAN) 1 MG tablet Take 1 tablet (1 mg total) by mouth every 6 (six) hours as needed for anxiety (or shakiness). 12 tablet 0    Musculoskeletal:  Unable to assess via camera   Psychiatric Specialty Exam: Physical Exam  ROS  Blood pressure 121/62, pulse 68, temperature 97.8 F (36.6 C), temperature source Oral, resp. rate 20, height _0  (1.676 m), weight 45.4 kg (100 lb), SpO2 100 %.Body mass index is 16.14 kg/m.  General Appearance: Disheveled  Eye Contact:  Minimal  Speech:  Clear and Coherent  Volume:  Varied   Mood:  Angry and Irritable  Affect:  Labile  Thought Process:  Disorganized  Orientation:  Full (Time, Place, and Person)  Thought Content:  Illogical and Paranoid Ideation  Suicidal Thoughts:  Yes.  with intent/plan  Homicidal Thoughts:  No  Memory:  Immediate;   Poor Recent;   Poor Remote;   Poor  Judgement:  Impaired  Insight:  Lacking  Psychomotor Activity:  Decreased  Concentration:  Concentration: Poor and Attention Span: Poor  Recall:  Poor  Fund of Knowledge:  Fair  Language:  Fair  Akathisia:  No  Handed:  Right  AIMS (if indicated):     Assets:  Communication Skills Leisure Time Resilience  ADL's:  Intact  Cognition:  Impaired,  Mild  Sleep:       Disposition: Recommend gero-psychiatric Inpatient admission for medication management when medically cleared. Supportive therapy provided about ongoing stressors.  Elmarie Shiley, NP 04/29/2016 3:53 PM

## 2016-04-29 NOTE — Progress Notes (Signed)
Reviewed chart due to request for medication recommendations. Spoke with patient's RN today Barbaraann Faster who reported that patient has been more calm. According to her report the patient has not been observed hallucinating and is not exhibiting signs of agitation. He is currently waiting for gero-psych placement due to need for evaluation to rule out alcohol-related dementia. During initial assessment per notes the patient reported that he drinks a half pint of liquor daily. On admission his daughter reported that the patient has been experiencing increased agitation, hallucinations, and paranoia. Per review of chart the patient does not have past psychiatric history other than for alcohol abuse. At this time treatment with antipsychotic medications is not warranted based on his current presentation. Yesterday the patient was very irritable during assessment, which could not be completed. TTS staff will continue to seek placement. His symptoms of alcohol withdrawal appear to be stabilizing as his most recent CIWA from this morning was zero with stable vital signs. Continue ativan taper at this time for management of acute alcohol withdrawal.

## 2016-04-30 MED ORDER — IBUPROFEN 400 MG PO TABS
400.0000 mg | ORAL_TABLET | Freq: Once | ORAL | Status: AC
  Administered 2016-04-30: 400 mg via ORAL
  Filled 2016-04-30: qty 1

## 2016-04-30 MED ORDER — LORAZEPAM 2 MG/ML IJ SOLN
1.0000 mg | Freq: Once | INTRAMUSCULAR | Status: DC
Start: 2016-04-30 — End: 2016-05-02

## 2016-04-30 NOTE — ED Notes (Signed)
Pt given meal tray at this time 

## 2016-04-30 NOTE — Progress Notes (Signed)
Spoke with Vaughan Basta at [facility] to obtain Authorization number for the [facility] referral. This writer was informed by Vaughan Basta that referral needs additional information on page 1, for SA level of care. Referral has been edited and refaxed to [facility] at fax#: [phone].  Awaiting call from [facility] with authorization number.  CSW will continue to follow up with placement efforts.  Verlon Setting, [place] Disposition staff 04/30/2016 7:15 PM

## 2016-04-30 NOTE — ED Notes (Signed)
Pt has been becoming more agitated as the night goes on. He cruses and talks hateful to staff but is easy to get to follow commands.

## 2016-04-30 NOTE — Progress Notes (Addendum)
Per clinician Carin Primrose authorization number is [id number]. [place] referral has been completed and faxed. Demographics have been given to [place] at [facility]. CSW will continue to follow up with placement efforts.  Verlon Setting, [place] Disposition staff 04/30/2016 8:15 PM

## 2016-04-30 NOTE — ED Notes (Signed)
Pt is able to do ADLs and ambulate without assistance.

## 2016-04-30 NOTE — ED Notes (Signed)
Received report on pt, pt resting, no distress noted, sitter remains at bedside,

## 2016-04-30 NOTE — Progress Notes (Signed)
[facility] per [facility].   Initiated [facility] referral due to mutiple declinations from geriatric facilities and lack of other inpatient placement options and pt's continuing need for inpatient treatment per psychiatry assessment 11/28.  Faxed authorization # request to [facility]. Awaiting authorization to proceed with referral to [facility].  Sharren Bridge, MSW, LCSW Clinical Social Work, Disposition  04/30/2016 [phone]

## 2016-04-30 NOTE — ED Notes (Signed)
Pt requesting to leave with "police custody so I can take care of so business at home". Increasingly aggitiated when made aware he would not be able to leave because he is IVC. Pt states "Do you even know anything about what I just said?".  CIWA performed.

## 2016-04-30 NOTE — BHH Counselor (Addendum)
[place], from TTS received a call from Babb[place] at [facility] and noted pt was placed on [facility] waiting list. Merian Capron discussed updated information with Catia, CSW.   CSW will continue to follow up with placement efforts.   Edd Fabian, MS, [facility], [facility] Triage Specialist [phone]

## 2016-05-01 ENCOUNTER — Emergency Department (HOSPITAL_COMMUNITY): Payer: Medicare Other

## 2016-05-01 DIAGNOSIS — Z818 Family history of other mental and behavioral disorders: Secondary | ICD-10-CM | POA: Diagnosis not present

## 2016-05-01 DIAGNOSIS — Z79899 Other long term (current) drug therapy: Secondary | ICD-10-CM | POA: Diagnosis not present

## 2016-05-01 DIAGNOSIS — Z9889 Other specified postprocedural states: Secondary | ICD-10-CM | POA: Diagnosis not present

## 2016-05-01 DIAGNOSIS — F17210 Nicotine dependence, cigarettes, uncomplicated: Secondary | ICD-10-CM | POA: Diagnosis not present

## 2016-05-01 DIAGNOSIS — R45851 Suicidal ideations: Secondary | ICD-10-CM | POA: Diagnosis not present

## 2016-05-01 DIAGNOSIS — R410 Disorientation, unspecified: Secondary | ICD-10-CM | POA: Diagnosis not present

## 2016-05-01 MED ORDER — QUETIAPINE FUMARATE 25 MG PO TABS
25.0000 mg | ORAL_TABLET | Freq: Every day | ORAL | Status: DC
  Administered 2016-05-02: 25 mg via ORAL
  Filled 2016-05-01: qty 1

## 2016-05-01 NOTE — ED Notes (Signed)
ED Provider at bedside. 

## 2016-05-01 NOTE — ED Notes (Signed)
Pt c/o burning pain all over .  Unable to rate pain.  Used painad pain scale.  Tylenol given. CIWA completed.

## 2016-05-01 NOTE — ED Notes (Signed)
Pt requesting tylenol for pain, pt very poor historian, pt unable to state where he is hurting, pt is a very poor historian,

## 2016-05-01 NOTE — Progress Notes (Signed)
Pt remains on [facility] waiting list per Gae Bon and [facility] per Rod Holler.

## 2016-05-01 NOTE — ED Notes (Signed)
Ambulatory to restroom without difficulty.

## 2016-05-01 NOTE — ED Notes (Signed)
Patient reports of feeling "like I cant breath good". Patient has a nonproductive cough.  No distress noted. MD made aware.

## 2016-05-01 NOTE — Consult Note (Signed)
Telepsych Consultation   Reason for Consult:  Agitation, hallucinations, and paranoia Referring Physician:  EDP Patient Identification: Charles [name] MRN:  [id number] Principal Diagnosis: Delirium Diagnosis:   Patient Active Problem List   Diagnosis Date Noted  . Peripheral vascular disease, unspecified [I73.9] 11/11/2012  . Encephalopathy, metabolic [U88.91] 69/45/0388  . Acute post-hemorrhagic anemia [D62] 10/11/2012  . Delirium [R41.0] 10/04/2012  . Salmonella bacteremia [R78.81] 09/25/2012  . Acute respiratory failure ([place]) [J96.00] 09/22/2012  . Hyponatremia [E87.1] 09/21/2012  . Adult failure to thrive [R62.7] 09/21/2012  . Dehydration [E86.0] 09/21/2012  . Generalized weakness [R53.1] 09/21/2012  . AAA (abdominal aortic aneurysm, ruptured) ([place]) [I71.3] 09/21/2012  . New onset atrial fibrillation ([place]) [I48.91] 09/21/2012  . Alcohol abuse [F10.10] 09/21/2012    Total Time spent with patient: 15 minutes  Subjective:   Charles [name] is a 70 y.o. male patient admitted to [facility] under IVC due to increasing agitation, hallucinations, and paranoia. Pt stated he does not have any problem with alcohol. Pt was discharged from [facility] On 04/24/16 with concern for alcohol withdrawal.   HPI:  Per initial Tele Assessment Note:   Charles [name] a 70 y.o.malewho presented to [facility] under police escort after his daughter Brantley Wiley  contacted police. Daughter told police that Pt was experiencing increased agitation, hallucinations, and paranoia. Pt is currently under IVC (petition filed by police). Pt's daughter was not available in spite of repeated efforts to contact.   Pt was last assessed by TTS on 04/23/16. At that time, Pt presented with daughter on a voluntary basis with complaint of altered mental status (hallucination, delirium, paranoia). During that assessment, Pt endorsed daily use of alcohol (one 1/2 pint of liquor and two 40 oz beers per day) and visual  hallucinations. TTS attempted inpatient placement, but Pt left AMA.  Patient seen for evaluation today 04/29/2016 as the patient was too agitated to participate yesterday. Charles [name] was hostile but more cooperative than yesterday. He appeared very irritable stating "Not you trying to talk to me again." The patient was difficult to follow during assessment due to rambling speech. Patient did make several comments potentially suggestive of psychosis "People are shooting at me. The [employer] are part of it." When asked to repeat himself the patient rolled over in the bed and stated "You know what I am talking about." The patient's mood remains very irritable. Charles [name] about conditions at home stating "I pee all over myself. Nobody cares. I can't go back there." The patient would not discuss his previous alcohol use. Since admission his symptoms of alcohol withdrawal appear to have stabilized with doses of ativan. Patient has been referred to gero-psych to rule out dementia and also for possible treatment of depressive symptoms. Yesterday the patient made a statement about wanting to starve to death. Patient continues to endorse depressive symptoms but refuses to elaborate due to his labile mood.   Tele psych consult today (05-01-16) for re-evaluation: Charles [name] is a 70 year old AA male who was admitted to the [facility] under IVC due to becoming increasingly agitated, paranoid, and having visual hallucinations.  Pt denies homicidal ideation, denies auditory/visual hallucinations and does not appear to be responding to internal stimuli. Pt stated he would not care if he was dead. Pt states "I can't kill myself because you won't let me out of here." Pt was agitated and angry, dressed in paper scrubs and lying on the hospital bed. Pt would not respond to questions regarding why he was  at the hospital and would state "you know why I an here, don't you." Pt continues to meet criteria for inpatient  psychiatric admission.    Past Psychiatric History: Alcohol abuse  Risk to Self: Suicidal Ideation: No Suicidal Intent: No Is patient at risk for suicide?: No Suicidal Plan?: No Access to Means: No What has been your use of drugs/alcohol within the last 12 months?: Daily alcohol use How many times?: 0 Other Self Harm Risks: None Triggers for Past Attempts: None known Intentional Self Injurious Behavior: None Risk to Others: Homicidal Ideation: No Thoughts of Harm to Others: No Current Homicidal Intent: No Current Homicidal Plan: No Access to Homicidal Means: No History of harm to others?: No Assessment of Violence: None Noted Violent Behavior Description: Denied Does patient have access to weapons?: Yes (Comment) (Weapon at home) Criminal Charges Pending?: No Does patient have a court date: No Prior Inpatient Therapy: Prior Inpatient Therapy: Yes Prior Therapy Dates: Unknown Prior Therapy Facilty/Provider(s): Unknown Reason for Treatment: Substance abuse Prior Outpatient Therapy: Prior Outpatient Therapy: Yes Prior Therapy Dates: Unknown Prior Therapy Facilty/Provider(s): Unknown Reason for Treatment: Substance abuse Does patient have an ACCT team?: No Does patient have Intensive In-House Services?  : No Does patient have [facility] services? : No Does patient have P4CC services?: No  Past Medical History:  Past Medical History:  Diagnosis Date  . AAA (abdominal aortic aneurysm) ([place])   . Collapsed lung    s/p fall, rib fracture  . Hypercholesteremia   . Hypertension   . Salmonella bacteremia     Past Surgical History:  Procedure Laterality Date  . ABDOMINAL AORTIC ANEURYSM REPAIR N/A 09/21/2012   Procedure: ANEURYSM ABDOMINAL AORTIC REPAIR;  Surgeon: Elam Dutch, MD;  Location: [facility];  Service: Vascular;  Laterality: N/A;  . EMBOLECTOMY Right 09/21/2012   Procedure: EMBOLECTOMY;  Surgeon: Elam Dutch, MD;  Location: [facility];  Service: Vascular;  Laterality:  Right;  Embolectomy of right leg.  Marland Kitchen FEMORAL-POPLITEAL BYPASS GRAFT Right 09/30/2012   Procedure: BYPASS GRAFT FEMORAL-POPLITEAL ARTERY;  Surgeon: Conrad Sergeant Bluff, MD;  Location: [facility];  Service: Vascular;  Laterality: Right;  . HERNIA REPAIR    . PATCH ANGIOPLASTY  09/21/2012   Procedure: PATCH ANGIOPLASTY;  Surgeon: Elam Dutch, MD;  Location: [facility];  Service: Vascular;;  [place]em[place] Patch Angioplasty fo Right Common Femoral Artery  . PLEURAL SCARIFICATION     collapsed lung  . WOUND DEBRIDEMENT  09/24/2012   Procedure: Removal of Abdominal wound VAC, and Abdominal Closure;  Surgeon: Elam Dutch, MD;  Location: [facility] OR;  Service: Vascular;;   Family History:  Family History  Problem Relation Age of Onset  . Diabetes Mother   . Diabetes Father    Family Psychiatric  History: unknown Social History:  History  Alcohol Use  . Yes    Comment: Daily - 1/2 pt liquor; 2 40 oz beers -- per hx     History  Drug Use No    Social History   Social History  . Marital status: Single    Spouse name: N/A  . Number of children: N/A  . Years of education: N/A   Social History Main Topics  . Smoking status: Current Every Day Smoker    Packs/day: 0.50    Types: Cigarettes  . Smokeless tobacco: Never Used  . Alcohol use Yes     Comment: Daily - 1/2 pt liquor; 2 40 oz beers -- per hx  . Drug use: No  .  Sexual activity: No   Other Topics Concern  . None   Social History Narrative  . None   Additional Social History:    Allergies:  No Known Allergies  Labs:  Results for orders placed or performed during the hospital encounter of 04/25/16 (from the past 48 hour(s))  Basic metabolic panel     Status: Abnormal   Collection Time: 04/29/16  3:06 PM  Result Value Ref Range   Sodium 132 (L) 135 - 145 mmol/L   Potassium 3.8 3.5 - 5.1 mmol/L   Chloride 101 101 - 111 mmol/L   CO2 26 22 - 32 mmol/L   Glucose, Bld 97 65 - 99 mg/dL   BUN 16 6 - 20 mg/dL   Creatinine, Ser 0.61 0.61 -  1.24 mg/dL   Calcium 8.2 (L) 8.9 - 10.3 mg/dL   GFR calc non Af Amer >60 >60 mL/min   GFR calc Af Amer >60 >60 mL/min    Comment: (NOTE) The eGFR has been calculated using the CKD EPI equation. This calculation has not been validated in all clinical situations. eGFR's persistently <60 mL/min signify possible Chronic Kidney Disease.    Anion gap 5 5 - 15  Troponin I     Status: None   Collection Time: 04/29/16  3:06 PM  Result Value Ref Range   Troponin I <0.03 <0.03 ng/mL    Current Facility-Administered Medications  Medication Dose Route Frequency Provider Last Rate Last Dose  . acetaminophen (TYLENOL) tablet 650 mg  650 mg Oral Q6H PRN Noemi Chapel, MD   650 mg at 05/01/16 0217  . LORazepam (ATIVAN) injection 1 mg  1 mg Intravenous Once Merryl Hacker, MD      . LORazepam (ATIVAN) tablet 0-4 mg  0-4 mg Oral Q12H Fredia Sorrow, MD   1 mg at 04/30/16 2148   Current Outpatient Prescriptions  Medication Sig Dispense Refill  . acetaminophen (TYLENOL) 325 MG tablet Take 1-2 tablets (325-650 mg total) by mouth every 4 (four) hours as needed. (Patient taking differently: Take 325-650 mg by mouth every 4 (four) hours as needed for mild pain. )    . ibuprofen (ADVIL,MOTRIN) 200 MG tablet Take 800 mg by mouth every 6 (six) hours as needed.    Marland Kitchen LORazepam (ATIVAN) 1 MG tablet Take 1 tablet (1 mg total) by mouth every 6 (six) hours as needed for anxiety (or shakiness). 12 tablet 0    Musculoskeletal: Unable to assess: camera  Psychiatric Specialty Exam: Physical Exam  Review of Systems  Psychiatric/Behavioral: Positive for depression, substance abuse and suicidal ideas. Negative for hallucinations and memory loss. The patient is not nervous/anxious and does not have insomnia.   All other systems reviewed and are negative.   Blood pressure 154/76, pulse 61, temperature 98.4 F (36.9 C), temperature source Oral, resp. rate 20, height 5' 6"  (1.676 m), weight 45.4 kg (100 lb), SpO2 98  %.Body mass index is 16.14 kg/m.  General Appearance: Casual  Eye Contact:  Fair  Speech:  Pressured  Volume:  Normal  Mood:  Angry, Anxious and Irritable  Affect:  Congruent  Thought Process:  Coherent  Orientation:  Full (Time, Place, and Person)  Thought Content:  Logical  Suicidal Thoughts:  Yes.  without intent/plan  Homicidal Thoughts:  No  Memory:  Immediate;   Good Recent;   Fair Remote;   Fair  Judgement:  Fair  Insight:  Fair  Psychomotor Activity:  Normal  Concentration:  Concentration: Fair and Attention Span:  Fair  Recall:  Smiley Houseman of Knowledge:  Fair  Language:  Good  Akathisia:  No  Handed:  Right  AIMS (if indicated):     Assets:  Agricultural consultant Resilience Social Support  ADL's:  Intact  Cognition:  WNL  Sleep:        Treatment Plan Summary: Daily contact with patient to assess and evaluate symptoms and progress in treatment and Medication management  Disposition: Recommend psychiatric Inpatient admission when medically cleared.  TTS to continue to seek inpatient placement.   Ethelene Hal, NP 05/01/2016 9:35 AM

## 2016-05-01 NOTE — ED Notes (Signed)
Tylenol given per prn order, pt requesting tv to be turned off, sitter remains at bedside, pt difficult to engage in conversation,

## 2016-05-02 ENCOUNTER — Emergency Department (HOSPITAL_COMMUNITY): Payer: Medicare Other

## 2016-05-02 ENCOUNTER — Inpatient Hospital Stay (HOSPITAL_COMMUNITY): Payer: Medicare Other

## 2016-05-02 DIAGNOSIS — Z818 Family history of other mental and behavioral disorders: Secondary | ICD-10-CM | POA: Diagnosis not present

## 2016-05-02 DIAGNOSIS — R443 Hallucinations, unspecified: Secondary | ICD-10-CM

## 2016-05-02 DIAGNOSIS — E7800 Pure hypercholesterolemia, unspecified: Secondary | ICD-10-CM | POA: Diagnosis present

## 2016-05-02 DIAGNOSIS — F0390 Unspecified dementia without behavioral disturbance: Secondary | ICD-10-CM | POA: Diagnosis present

## 2016-05-02 DIAGNOSIS — C7931 Secondary malignant neoplasm of brain: Secondary | ICD-10-CM | POA: Diagnosis present

## 2016-05-02 DIAGNOSIS — F1010 Alcohol abuse, uncomplicated: Secondary | ICD-10-CM | POA: Diagnosis present

## 2016-05-02 DIAGNOSIS — R410 Disorientation, unspecified: Secondary | ICD-10-CM | POA: Diagnosis not present

## 2016-05-02 DIAGNOSIS — R911 Solitary pulmonary nodule: Secondary | ICD-10-CM | POA: Diagnosis present

## 2016-05-02 DIAGNOSIS — Z833 Family history of diabetes mellitus: Secondary | ICD-10-CM | POA: Diagnosis not present

## 2016-05-02 DIAGNOSIS — G936 Cerebral edema: Secondary | ICD-10-CM | POA: Diagnosis present

## 2016-05-02 DIAGNOSIS — E46 Unspecified protein-calorie malnutrition: Secondary | ICD-10-CM | POA: Diagnosis present

## 2016-05-02 DIAGNOSIS — R64 Cachexia: Secondary | ICD-10-CM | POA: Diagnosis present

## 2016-05-02 DIAGNOSIS — E43 Unspecified severe protein-calorie malnutrition: Secondary | ICD-10-CM | POA: Diagnosis present

## 2016-05-02 DIAGNOSIS — J984 Other disorders of lung: Secondary | ICD-10-CM | POA: Insufficient documentation

## 2016-05-02 DIAGNOSIS — I739 Peripheral vascular disease, unspecified: Secondary | ICD-10-CM | POA: Diagnosis present

## 2016-05-02 DIAGNOSIS — Z681 Body mass index (BMI) 19 or less, adult: Secondary | ICD-10-CM | POA: Diagnosis not present

## 2016-05-02 DIAGNOSIS — Z9889 Other specified postprocedural states: Secondary | ICD-10-CM | POA: Diagnosis not present

## 2016-05-02 DIAGNOSIS — F17210 Nicotine dependence, cigarettes, uncomplicated: Secondary | ICD-10-CM | POA: Diagnosis present

## 2016-05-02 DIAGNOSIS — I4891 Unspecified atrial fibrillation: Secondary | ICD-10-CM | POA: Diagnosis present

## 2016-05-02 DIAGNOSIS — C801 Malignant (primary) neoplasm, unspecified: Secondary | ICD-10-CM

## 2016-05-02 DIAGNOSIS — I10 Essential (primary) hypertension: Secondary | ICD-10-CM | POA: Diagnosis present

## 2016-05-02 DIAGNOSIS — D649 Anemia, unspecified: Secondary | ICD-10-CM

## 2016-05-02 DIAGNOSIS — I713 Abdominal aortic aneurysm, ruptured: Secondary | ICD-10-CM | POA: Diagnosis not present

## 2016-05-02 DIAGNOSIS — Z515 Encounter for palliative care: Secondary | ICD-10-CM | POA: Diagnosis present

## 2016-05-02 DIAGNOSIS — E871 Hypo-osmolality and hyponatremia: Secondary | ICD-10-CM

## 2016-05-02 DIAGNOSIS — R918 Other nonspecific abnormal finding of lung field: Secondary | ICD-10-CM | POA: Diagnosis present

## 2016-05-02 DIAGNOSIS — D381 Neoplasm of uncertain behavior of trachea, bronchus and lung: Secondary | ICD-10-CM | POA: Diagnosis present

## 2016-05-02 DIAGNOSIS — F05 Delirium due to known physiological condition: Secondary | ICD-10-CM | POA: Diagnosis present

## 2016-05-02 LAB — COMPREHENSIVE METABOLIC PANEL
ALT: 20 U/L (ref 17–63)
ANION GAP: 6 (ref 5–15)
AST: 28 U/L (ref 15–41)
Albumin: 2.6 g/dL — ABNORMAL LOW (ref 3.5–5.0)
Alkaline Phosphatase: 45 U/L (ref 38–126)
BUN: 12 mg/dL (ref 6–20)
CHLORIDE: 103 mmol/L (ref 101–111)
CO2: 24 mmol/L (ref 22–32)
Calcium: 8.1 mg/dL — ABNORMAL LOW (ref 8.9–10.3)
Creatinine, Ser: 0.79 mg/dL (ref 0.61–1.24)
Glucose, Bld: 125 mg/dL — ABNORMAL HIGH (ref 65–99)
POTASSIUM: 3.6 mmol/L (ref 3.5–5.1)
SODIUM: 133 mmol/L — AB (ref 135–145)
Total Bilirubin: 0.2 mg/dL — ABNORMAL LOW (ref 0.3–1.2)
Total Protein: 6.5 g/dL (ref 6.5–8.1)

## 2016-05-02 LAB — CBC WITH DIFFERENTIAL/PLATELET
Basophils Absolute: 0 10*3/uL (ref 0.0–0.1)
Basophils Relative: 1 %
EOS ABS: 0.1 10*3/uL (ref 0.0–0.7)
EOS PCT: 1 %
HCT: 31.8 % — ABNORMAL LOW (ref 39.0–52.0)
Hemoglobin: 10.4 g/dL — ABNORMAL LOW (ref 13.0–17.0)
LYMPHS ABS: 1.4 10*3/uL (ref 0.7–4.0)
LYMPHS PCT: 22 %
MCH: 32.5 pg (ref 26.0–34.0)
MCHC: 32.7 g/dL (ref 30.0–36.0)
MCV: 99.4 fL (ref 78.0–100.0)
MONO ABS: 0.9 10*3/uL (ref 0.1–1.0)
Monocytes Relative: 15 %
Neutro Abs: 3.9 10*3/uL (ref 1.7–7.7)
Neutrophils Relative %: 61 %
PLATELETS: 159 10*3/uL (ref 150–400)
RBC: 3.2 MIL/uL — ABNORMAL LOW (ref 4.22–5.81)
RDW: 12.5 % (ref 11.5–15.5)
WBC: 6.2 10*3/uL (ref 4.0–10.5)

## 2016-05-02 LAB — MRSA PCR SCREENING: MRSA BY PCR: NEGATIVE

## 2016-05-02 MED ORDER — SODIUM CHLORIDE 0.9 % IV SOLN: 250.0000 mL | INTRAVENOUS | Status: DC | PRN

## 2016-05-02 MED ORDER — SODIUM CHLORIDE 0.9 % IV BOLUS (SEPSIS)
750.0000 mL | Freq: Once | INTRAVENOUS | Status: AC
  Administered 2016-05-02: 750 mL via INTRAVENOUS

## 2016-05-02 MED ORDER — GADOBENATE DIMEGLUMINE 529 MG/ML IV SOLN
10.0000 mL | Freq: Once | INTRAVENOUS | Status: AC | PRN
  Administered 2016-05-02: 9 mL via INTRAVENOUS

## 2016-05-02 MED ORDER — ONDANSETRON HCL 4 MG/2ML IJ SOLN: 4.0000 mg | Freq: Four times a day (QID) | INTRAMUSCULAR | Status: DC | PRN

## 2016-05-02 MED ORDER — IOPAMIDOL (ISOVUE-300) INJECTION 61%
75.0000 mL | Freq: Once | INTRAVENOUS | Status: AC | PRN
  Administered 2016-05-02: 75 mL via INTRAVENOUS

## 2016-05-02 MED ORDER — SODIUM CHLORIDE 0.9% FLUSH: 3.0000 mL | INTRAVENOUS | Status: DC | PRN

## 2016-05-02 MED ORDER — QUETIAPINE FUMARATE 25 MG PO TABS
ORAL_TABLET | ORAL | Status: AC
  Filled 2016-05-02: qty 1

## 2016-05-02 MED ORDER — SODIUM CHLORIDE 0.9% FLUSH
3.0000 mL | Freq: Two times a day (BID) | INTRAVENOUS | Status: DC
  Administered 2016-05-02 – 2016-05-14 (×22): 3 mL via INTRAVENOUS

## 2016-05-02 MED ORDER — ONDANSETRON HCL 4 MG PO TABS: 4.0000 mg | ORAL_TABLET | Freq: Four times a day (QID) | ORAL | Status: DC | PRN

## 2016-05-02 MED ORDER — HYDROCODONE-ACETAMINOPHEN 5-325 MG PO TABS
1.0000 | ORAL_TABLET | ORAL | Status: DC | PRN
  Administered 2016-05-02 – 2016-05-13 (×40): 1 via ORAL
  Filled 2016-05-02 (×40): qty 1

## 2016-05-02 NOTE — Progress Notes (Signed)
Initial Nutrition Assessment  INTERVENTION:  -When diet is advanced add Ensure Enlive po TID, each supplement provides 350 kcal and 20 grams of protein   -Recommend least restrictive diet   -If pt remains at [facility] recommend Palliative medicine consult to assist with establishing goals of care  NUTRITION DIAGNOSIS:  Inadequate oral intake related to catabolic illness, altered mental status as evidenced by pt hx and underweight with a BMI of 14.     GOAL: Provide nutrition care based on pt/family wishes     MONITOR: Diet advancement, Po intake, labs and wt trends     REASON FOR ASSESSMENT: Malnutrition Screen      ASSESSMENT:  Per MD: Charles [name] is a 70 y.o. male with medical history significant of alcohol abuse, AAA, HTN was brought to the ED last week by his daughter for hallucinations and delirium.  Pt has been in ED awaiting psych placement since nov 24.  I have spoken to his safety sitter who has been sitting with him daily since before thanksgiving.  He has been hallucinating for over a week.  It was initially thought this was due to etoh withdrawal.  He was started on CIWA protocol.  Now it is day 6 of CIWA and pt is still having some hallucinations.  He started coughing tonight so a cxr was done which showed a lesion therefore a ct scan of his chest was done which shows a lesion concerning for malignancy.  A ct head with contrast was then done tonight which shows a possible metastatic lesion.  Pt was then referred for medical work up of his likely underlying malignancy causing his changes in mental status.   There is a Actuary here with him. The patient is unable to provide any meaningful hx. He says "I'm not his Dr". Tried to help pt understand but he is adamant he needs to call the marshal. He says he will not eat our food now that we are trying to poison him. It looks like his meal intake while he was in the ED: ranging from 40-100%.   He was diagnosed in April of 2014 with  severe malnutrition. His weight 100# then following a loss from 130#. His current weight is within 2# of what he weighed then. He remains severely malnourished.   Unable to complete full Nutrition-Focused physical exam at this time.  He has  severe temporal wasting and fat depletion to arms.  Diet Order:  Diet NPO time specified  Skin:   intact   Last BM:   Unknown-none documented since admission  Height:   Ht Readings from Last 1 Encounters:  05/02/16 '5\' 10"'$  (1.778 m)    Weight:   Wt Readings from Last 1 Encounters:  05/02/16 98 lb 1.7 oz (44.5 kg)    Ideal Body Weight:   75 kg  BMI:  Body mass index is 14.08 kg/m.  Estimated Nutritional Needs:   Kcal:   4098-1191   Protein:   85-90 gr  Fluid:   1.4 liters daily  EDUCATION NEEDS: unable to provide pt education - he is not all able to participate    Colman Cater MS,RD,CSG,LDN Office: [phone] Pager: [phone]

## 2016-05-02 NOTE — Progress Notes (Signed)
MD notified with request for patient to have a diet order.

## 2016-05-02 NOTE — Consult Note (Signed)
[facility] Consultation Oncology  Name: Charles [name]      MRN: [id number]    Location: IC01/IC01-01  Date: 05/02/2016 Time:3:48 PM   REFERRING PHYSICIAN:  Derrill Kay, MD ([facility])  REASON FOR CONSULT:  70 yo male with new lung mass and brain met   DIAGNOSIS:  Right medial lung apex mass with cavitation and necrosis measuring 3.6 x 3.6 cm, consolidation within the anterior lingula measuring 2.4 x 1.9 cm and a nodule in the RUL measuring 1.0 x 0.7 mm and a 5 mm metastatic deposit right cerebellum with mild surrounding edema  HISTORY OF PRESENT ILLNESS:   Charles [name] is a 70 y.o. male with a medical history significant for FTT, AAA, a-fib, EtOH abuse, and PVD who is referred to the [facility] for a newly discovered right lung mass and small 5 mm right cerebellar lesion.  According to admitting hospitalist: Charles [name] is a 70 y.o. male with medical history significant of alcohol abuse, AAA, HTN was brought to the ED last week by his daughter for hallucinations and delirium.  Pt has been in ED awaiting psych placement since nov 24.  I have spoken to his safety sitter who has been sitting with him daily since before thanksgiving.  He has been hallucinating for over a week.  It was initially thought this was due to etoh withdrawal.  He was started on CIWA protocol.  Now it is day 6 of CIWA and pt is still having some hallucinations.  He started coughing tonight so a cxr was done which showed a lesion therefore a ct scan of his chest was done which shows a lesion concerning for malignancy.  A ct head with contrast was then done tonight which shows a possible metastatic lesion.  Pt was then referred for medical work up of his likely underlying malignancy causing his changes in mental status.  Pt denies any pain, he says he has been loosing weight.  He says he has been coughing up some blood especially in the mornings.    I was unable to ascertain any history  from the patient today.  He is seen in [place] with a sitter at the bedside.    After introducing myself, the patient reports "I can't talk to you without a [place].  You will need to call them."  He continued to explain "I am on strict orders from my private physician to not fuck with the doctors up here."   On multiple attempts I tried to get some information from the patient and review the reason for my consult, but he was not interested and continued to explain that he could not speak with me without a [place] and he has orders from his private physician to not "fuck with the doctors here."  He reports that I cannot get in touch with his private doctor.  PAST MEDICAL HISTORY:   Past Medical History:  Diagnosis Date  . AAA (abdominal aortic aneurysm) ([place])   . Collapsed lung    s/p fall, rib fracture  . Hypercholesteremia   . Hypertension   . Salmonella bacteremia     ALLERGIES: No Known Allergies    MEDICATIONS: I have reviewed the patient's current medications.     PAST SURGICAL HISTORY Past Surgical History:  Procedure Laterality Date  . ABDOMINAL AORTIC ANEURYSM REPAIR N/A 09/21/2012   Procedure: ANEURYSM ABDOMINAL AORTIC REPAIR;  Surgeon: Elam Dutch, MD;  Location: [place];  Service:  Vascular;  Laterality: N/A;  . EMBOLECTOMY Right 09/21/2012   Procedure: EMBOLECTOMY;  Surgeon: Elam Dutch, MD;  Location: [facility] OR;  Service: Vascular;  Laterality: Right;  Embolectomy of right leg.  Marland Kitchen FEMORAL-POPLITEAL BYPASS GRAFT Right 09/30/2012   Procedure: BYPASS GRAFT FEMORAL-POPLITEAL ARTERY;  Surgeon: Conrad Mount Vernon, MD;  Location: [place];  Service: Vascular;  Laterality: Right;  . HERNIA REPAIR    . PATCH ANGIOPLASTY  09/21/2012   Procedure: PATCH ANGIOPLASTY;  Surgeon: Elam Dutch, MD;  Location: [facility] OR;  Service: Vascular;;  Hem[place]ield Patch Angioplasty fo Right Common Femoral Artery  . PLEURAL SCARIFICATION     collapsed lung  . WOUND DEBRIDEMENT  09/24/2012    Procedure: Removal of Abdominal wound VAC, and Abdominal Closure;  Surgeon: Elam Dutch, MD;  Location: [facility] OR;  Service: Vascular;;    FAMILY HISTORY: Family History  Problem Relation Age of Onset  . Diabetes Mother   . Diabetes Father     SOCIAL HISTORY:  reports that he has been smoking Cigarettes.  He has been smoking about 0.50 packs per day. He has never used smokeless tobacco. He reports that he drinks alcohol. He reports that he does not use drugs.  PERFORMANCE STATUS: The patient's performance status is: indeterminable at this time  PHYSICAL EXAM: Most Recent Vital Signs: Blood pressure (!) 154/71, pulse 68, temperature 98.5 F (36.9 C), temperature source Oral, resp. rate 19, height 5' 10" (1.778 m), weight 98 lb 1.7 oz (44.5 kg), SpO2 98 %. General appearance: alert, no distress and inappropriate behavior and conversation Head: Normocephalic, without obvious abnormality, atraumatic Patient refuses exam  LABORATORY DATA:  Results for orders placed or performed during the hospital encounter of 04/25/16 (from the past 48 hour(s))  CBC with Differential     Status: Abnormal   Collection Time: 05/02/16  5:37 AM  Result Value Ref Range   WBC 6.2 4.0 - 10.5 K/uL   RBC 3.20 (L) 4.22 - 5.81 MIL/uL   Hemoglobin 10.4 (L) 13.0 - 17.0 g/dL   HCT 31.8 (L) 39.0 - 52.0 %   MCV 99.4 78.0 - 100.0 fL   MCH 32.5 26.0 - 34.0 pg   MCHC 32.7 30.0 - 36.0 g/dL   RDW 12.5 11.5 - 15.5 %   Platelets 159 150 - 400 K/uL   Neutrophils Relative % 61 %   Neutro Abs 3.9 1.7 - 7.7 K/uL   Lymphocytes Relative 22 %   Lymphs Abs 1.4 0.7 - 4.0 K/uL   Monocytes Relative 15 %   Monocytes Absolute 0.9 0.1 - 1.0 K/uL   Eosinophils Relative 1 %   Eosinophils Absolute 0.1 0.0 - 0.7 K/uL   Basophils Relative 1 %   Basophils Absolute 0.0 0.0 - 0.1 K/uL  Comprehensive metabolic panel     Status: Abnormal   Collection Time: 05/02/16  5:37 AM  Result Value Ref Range   Sodium 133 (L) 135 - 145 mmol/L    Potassium 3.6 3.5 - 5.1 mmol/L   Chloride 103 101 - 111 mmol/L   CO2 24 22 - 32 mmol/L   Glucose, Bld 125 (H) 65 - 99 mg/dL   BUN 12 6 - 20 mg/dL   Creatinine, Ser 0.79 0.61 - 1.24 mg/dL   Calcium 8.1 (L) 8.9 - 10.3 mg/dL   Total Protein 6.5 6.5 - 8.1 g/dL   Albumin 2.6 (L) 3.5 - 5.0 g/dL   AST 28 15 - 41 U/L   ALT 20 17 -  63 U/L   Alkaline Phosphatase 45 38 - 126 U/L   Total Bilirubin 0.2 (L) 0.3 - 1.2 mg/dL   GFR calc non Af Amer >60 >60 mL/min   GFR calc Af Amer >60 >60 mL/min    Comment: (NOTE) The eGFR has been calculated using the CKD EPI equation. This calculation has not been validated in all clinical situations. eGFR's persistently <60 mL/min signify possible Chronic Kidney Disease.    Anion gap 6 5 - 15  MRSA PCR Screening     Status: None   Collection Time: 05/02/16  8:33 AM  Result Value Ref Range   MRSA by PCR NEGATIVE NEGATIVE    Comment:        The GeneXpert MRSA Assay (FDA approved for NASAL specimens only), is one component of a comprehensive MRSA colonization surveillance program. It is not intended to diagnose MRSA infection nor to guide or monitor treatment for MRSA infections.       RADIOGRAPHY: Dg Chest 2 View  Result Date: 05/01/2016 CLINICAL DATA:  Cough and congestion. EXAM: CHEST  2 VIEW COMPARISON:  04/23/2016 FINDINGS: COPD with hyperinflation and emphysematous changes. There is a right paramediastinal opacity that appears new from 2014. No internal air bronchograms are noted. No edema, effusion, or pneumothorax. Normal heart size.  Aortic atherosclerosis. IMPRESSION: 1. Medial right apex opacity which is new from 2014. Recommend chest CT to differentiate mass from pneumonia. 2. COPD. Electronically Signed   By: Monte Fantasia M.D.   On: 05/01/2016 12:34   Ct Head W & Wo Contrast  Result Date: 05/02/2016 CLINICAL DATA:  Altered mental status. Recently diagnosed lung cancer. EXAM: CT HEAD WITHOUT AND WITH CONTRAST TECHNIQUE: Contiguous  axial images were obtained from the base of the skull through the vertex without and with intravenous contrast CONTRAST:  15m ISOVUE-300 IOPAMIDOL (ISOVUE-300) INJECTION 61% COMPARISON:  Head CT 04/26/2016 FINDINGS: Brain: There is a focal contrast-enhancing lesion within the right cerebellum that measures 5 mm. No other enhancing lesions are identified. There is periventricular hypoattenuation compatible with chronic microvascular disease. No mass effect or midline shift. No hydrocephalus or extra-axial collection. No acute hemorrhage. Vascular: Atherosclerotic arterial calcifications of the skullbase. Skull: No skull fracture or focal calvarial lesion. Visualized skull base is normal. Sinuses/Orbits: There is complete opacification of the right maxillary sinus. Chronic right lamina papyracea fracture. Otherwise normal orbits. IMPRESSION: 1. Single enhancing metastatic lesion within the right cerebellar hemisphere, measuring 5 mm. MRI is recommended to evaluate the complete extent of intracranial metastatic disease. 2. No mass effect, hemorrhage or evidence of acute infarct. Electronically Signed   By: KUlyses JarredM.D.   On: 05/02/2016 05:01   Ct Chest W Contrast  Result Date: 05/02/2016 CLINICAL DATA:  Right upper lobe masses EXAM: CT CHEST WITH CONTRAST TECHNIQUE: Multidetector CT imaging of the chest was performed during intravenous contrast administration. CONTRAST:  737mISOVUE-300 IOPAMIDOL (ISOVUE-300) INJECTION 61% COMPARISON:  Chest radiograph 05/01/2016 FINDINGS: Cardiovascular: There is atherosclerotic calcification of the aortic arch and the proximal arch vessels. There is extensive coronary artery calcification. Heart size is within normal limits. No pericardial effusion. Mediastinum/Nodes: No mediastinal, hilar or axillary lymphadenopathy. The visualized thyroid and thoracic esophageal course are unremarkable. Lungs/Pleura: There is a mass in the medial right lung apex with areas of cavitation  and necrosis, measuring altogether 3.6 x 3.6 cm. This abuts the pleura in there is loss of the normal mediastinal fat surrounding the esophagus, suggesting a degree of invasion into the mediastinum. There is  severe bullous emphysema with areas of architectural distortion. There is a masslike area of consolidation within the anterior lingula that measures 2.4 x 1.9 cm. No pleural effusion. Additional smaller nodular opacity in the right upper lobe measures 10 x 7 mm. Upper Abdomen: No acute abnormality. Musculoskeletal: No chest wall abnormality. No acute or significant osseous findings. IMPRESSION: 1. Necrotic/cavitary right upper lobe mass with loss of adjacent mediastinal fat planes suggesting possible invasion into the mediastinum. The appearance is most suggestive of squamous cell carcinoma. A cavitary infectious process is considered less likely. 2. Suspected satellite nodule in the right upper lobe measuring up to 1 cm. 3. Masslike consolidation in the lingula is also suspicious for neoplasm. Follow-up imaging would be helpful to determine if this focus may be infectious or inflammatory. 4. Aortic and coronary artery atherosclerosis. Electronically Signed   By: Ulyses Jarred M.D.   On: 05/02/2016 02:26   Mr Jeri Cos IO Contrast  Result Date: 05/02/2016 CLINICAL DATA:  New diagnosis lung cancer.  Abnormal CT head. EXAM: MRI HEAD WITHOUT AND WITH CONTRAST TECHNIQUE: Multiplanar, multiecho pulse sequences of the brain and surrounding structures were obtained without and with intravenous contrast. CONTRAST:  97m MULTIHANCE GADOBENATE DIMEGLUMINE 529 MG/ML IV SOLN COMPARISON:  CT head 05/02/2016 FINDINGS: Brain: 5 mm enhancing lesion in the right cerebellum with central necrosis as noted on CT. Mild surrounding edema. This is consistent with metastatic disease. No other enhancing brain lesions identified. Image quality degraded by motion as well as artifact from hair braids. Mild atrophy. Chronic microvascular  ischemic changes in the white matter and basal ganglia. No acute infarct. Negative for hemorrhage. Pituitary normal in size. Vascular: Normal arterial flow voids. Skull and upper cervical spine: Abnormal hypo intensity in the C2 and C3 vertebral bodies worrisome for bony metastatic disease. No lesions in the calvarium identified. Sinuses/Orbits: Complete opacification of the right maxillary sinus. Chronic fracture of the right medial orbit. No orbital mass lesion. Other: None IMPRESSION: 5 mm enhancing metastatic deposit right cerebellum with mild surrounding edema. No other enhancing brain lesions Abnormality of the C2 and C3 vertebral bodies, concerning for metastatic disease to bone. Recommend cervical spine MRI without with contrast for further evaluation. Electronically Signed   By: CFranchot GalloM.D.   On: 05/02/2016 08:20       PATHOLOGY:  Not available  ASSESSMENT/PLAN: 1. Right medial lung apex mass with cavitation and necrosis measuring 3.6 x 3.6 cm, consolidation within the anterior lingula measuring 2.4 x 1.9 cm and a nodule in the RUL measuring 1.0 x 0.7 mm and a 5 mm metastatic deposit right cerebellum with mild surrounding edema.  Imaging suspicious for bronchogenic carcinoma.  Pulmonology is consulted and we will defer the decision for biopsy them (if recommended).  No hypercalcemia on labs today.  Will not pursue further imaging or staging tests at this time.  He is not in a mental state to make medical decisions for himself and without significant improvement/change, he is NOT a candidate for treatment.  At this time, [facility], cachexia, mental status, EtOHism, etc. 2. Hallucination/delirium- unlikely from small 5 mm cerebellar lesion without significant edema, mass effect, hemorrhage, etc. 3. Mild anemia, normocytic and normochromic.  Order placed for anemia panel and retic count. 4. EtOHism 5. He needs psych intervention.   Would consider neurology consult for possible lumbar puncture to find the cause of his confusion (although patient's ability to be compliant for this procedure is questionable).  All questions were answered. The patient knows to call the clinic with any problems, questions or concerns. We can certainly see the patient much sooner if necessary.  Patient and plan discussed with Dr. Ancil Linsey and she is in agreement with the aforementioned.   Doy Mince 05/02/2016 3:54 PM  Patient discussed with Kirby Crigler, note that patient is not cooperative to exam at this time. It is highly unlikely that his confusion is secondary to small lesion seen on brain imaging. I would recommend neurology consultation and continue with psychiatric evaluation. I would question at this point if patient can make any decisions regarding his probable malignancy. Will continue to follow peripherally, please notify us if/when further assistance needed. If he clears mentally and additional evaluation/work-up needed will be glad to assist. However given co-morbidities hospice may ultimately be best option for non-curative disease. Donald Pore MD

## 2016-05-02 NOTE — H&P (Signed)
History and Physical    Charles [name] ZOX:[id number] DOB: Dec 19, 1945 DOA: 04/25/2016  PCP: No PCP Per Patient  Patient coming from: home  Chief Complaint:  hallucinations  HPI: Charles [name] is [name] 70 y.o. male with medical history significant of alcohol abuse, AAA, HTN was brought to the ED last week by his daughter for hallucinations and delirium.  Pt has been in ED awaiting psych placement since nov 24.  I have spoken to his safety sitter who has been sitting with him daily since before thanksgiving.  He has been hallucinating for over [name] week.  It was initially thought this was due to etoh withdrawal.  He was started on CIWA protocol.  Now it is day 6 of CIWA and pt is still having some hallucinations.  He started coughing tonight so [name] cxr was done which showed [name] lesion therefore [name] ct scan of his chest was done which shows [name] lesion concerning for malignancy.  [name] ct head with contrast was then done tonight which shows [name] possible metastatic lesion.  Pt was then referred for medical work up of his likely underlying malignancy causing his changes in mental status.  Pt denies any pain, he says he has been loosing weight.  He says he has been coughing up some blood especially in the mornings.    Review of Systems: As per HPI otherwise 10 point review of systems negative.   Past Medical History:  Diagnosis Date  . AAA (abdominal aortic aneurysm) ([place])   . Collapsed lung    s/p fall, rib fracture  . Hypercholesteremia   . Hypertension   . Salmonella bacteremia     Past Surgical History:  Procedure Laterality Date  . ABDOMINAL AORTIC ANEURYSM REPAIR N/[name] 09/21/2012   Procedure: ANEURYSM ABDOMINAL AORTIC REPAIR;  Surgeon: Elam Dutch, MD;  Location: [facility];  Service: Vascular;  Laterality: N/[name];  . EMBOLECTOMY Right 09/21/2012   Procedure: EMBOLECTOMY;  Surgeon: Elam Dutch, MD;  Location: [place];  Service: Vascular;  Laterality: Right;  Embolectomy of right leg.  Marland Kitchen FEMORAL-POPLITEAL  BYPASS GRAFT Right 09/30/2012   Procedure: BYPASS GRAFT FEMORAL-POPLITEAL ARTERY;  Surgeon: Conrad Hanover Park, MD;  Location: [facility];  Service: Vascular;  Laterality: Right;  . HERNIA REPAIR    . PATCH ANGIOPLASTY  09/21/2012   Procedure: PATCH ANGIOPLASTY;  Surgeon: Elam Dutch, MD;  Location[facility];  Service: Vascular;;  [place] Patch Angioplasty fo Right Common Femoral Artery  . PLEURAL SCARIFICATION     collapsed lung  . WOUND DEBRIDEMENT  09/24/2012   Procedure: Removal of Abdominal wound VAC, and Abdominal Closure;  Surgeon: Elam Dutch, MD;  Location: [facility];  Service: Vascular;;     reports that he has been smoking Cigarettes.  He has been smoking about 0.50 packs per day. He has never used smokeless tobacco. He reports that he drinks alcohol. He reports that he does not use drugs.  No Known Allergies  Family History  Problem Relation Age of Onset  . Diabetes Mother   . Diabetes Father     Prior to Admission medications   Medication Sig Start Date End Date Taking? Authorizing Provider  acetaminophen (TYLENOL) 325 MG tablet Take 1-2 tablets (325-650 mg total) by mouth every 4 (four) hours as needed. Patient taking differently: Take 325-650 mg by mouth every 4 (four) hours as needed for mild pain.  10/18/12   Bary Leriche, PA-C  ibuprofen (ADVIL,MOTRIN) 200 MG tablet Take 800 mg  by mouth every 6 (six) hours as needed.    Historical Provider, MD  LORazepam (ATIVAN) 1 MG tablet Take 1 tablet (1 mg total) by mouth every 6 (six) hours as needed for anxiety (or shakiness). 20/94/70   Delora Fuel, MD    Physical Exam: Vitals:   05/01/16 1725 05/01/16 2051 05/01/16 2300 05/02/16 0052  BP: 150/90 130/84 166/85 166/85  Pulse: 62 73 86 86  Resp:  '15 16 16  '$ Temp:  98.7 F (37.1 C) 98.1 F (36.7 C)   TempSrc:  Oral Oral   SpO2:  95% 100% 100%  Weight:      Height:        Constitutional: NAD, calm, comfortable, cachectic Vitals:   05/01/16 1725 05/01/16 2051 05/01/16 2300  05/02/16 0052  BP: 150/90 130/84 166/85 166/85  Pulse: 62 73 86 86  Resp:  '15 16 16  '$ Temp:  98.7 F (37.1 C) 98.1 F (36.7 C)   TempSrc:  Oral Oral   SpO2:  95% 100% 100%  Weight:      Height:       Eyes: PERRL, lids and conjunctivae normal ENMT: Mucous membranes are moist. Posterior pharynx clear of any exudate or lesions.Normal dentition.  Neck: normal, supple, no masses, no thyromegaly Respiratory: clear to auscultation bilaterally, no wheezing, no crackles. Normal respiratory effort. No accessory muscle use.  Cardiovascular: Regular rate and rhythm, no murmurs / rubs / gallops. No extremity edema. 2+ pedal pulses. No carotid bruits.  Abdomen: no tenderness, no masses palpated. No hepatosplenomegaly. Bowel sounds positive.  Musculoskeletal: no clubbing / cyanosis. No joint deformity upper and lower extremities. Good ROM, no contractures. Normal muscle tone.  Skin: no rashes, lesions, ulcers. No induration Neurologic: CN 2-12 grossly intact. Sensation intact, DTR normal. Strength 5/5 in all 4.  Psychiatric: Normal judgment and insight. Alert and oriented x 3. Normal mood.    Labs on Admission: I have personally reviewed following labs and imaging studies  CBC:  Recent Labs Lab 04/25/16 1304  WBC 5.9  NEUTROABS 3.6  HGB 11.6*  HCT 35.0*  MCV 98.3  PLT 962*   Basic Metabolic Panel:  Recent Labs Lab 04/25/16 1304 04/29/16 1506  NA 134* 132*  K 3.3* 3.8  CL 99* 101  CO2 25 26  GLUCOSE 104* 97  BUN 13 16  CREATININE 0.66 0.61  CALCIUM 8.8* 8.2*   GFR: Estimated Creatinine Clearance: 53.6 mL/min (by C-G formula based on SCr of 0.61 mg/dL). Liver Function Tests:  Recent Labs Lab 04/25/16 1304  AST 30  ALT 16*  ALKPHOS 53  BILITOT 0.9  PROT 7.1  ALBUMIN 3.2*    Cardiac Enzymes:  Recent Labs Lab 04/29/16 1506  TROPONINI <0.03   Urine analysis:    Component Value Date/Time   COLORURINE YELLOW 04/23/2016 1817   APPEARANCEUR CLEAR 04/23/2016 1817     LABSPEC 1.020 04/23/2016 1817   PHURINE 5.5 04/23/2016 1817   GLUCOSEU NEGATIVE 04/23/2016 1817   HGBUR NEGATIVE 04/23/2016 1817   BILIRUBINUR SMALL ([name]) 04/23/2016 1817   KETONESUR 15 ([name]) 04/23/2016 1817   PROTEINUR NEGATIVE 04/23/2016 1817   UROBILINOGEN 0.2 10/11/2012 0020   NITRITE NEGATIVE 04/23/2016 1817   LEUKOCYTESUR NEGATIVE 04/23/2016 1817    Radiological Exams on Admission: Dg Chest 2 View  Result Date: 05/01/2016 CLINICAL DATA:  Cough and congestion. EXAM: CHEST  2 VIEW COMPARISON:  04/23/2016 FINDINGS: COPD with hyperinflation and emphysematous changes. There is [name] right paramediastinal opacity that appears new from 2014. No  internal air bronchograms are noted. No edema, effusion, or pneumothorax. Normal heart size.  Aortic atherosclerosis. IMPRESSION: 1. Medial right apex opacity which is new from 2014. Recommend chest CT to differentiate mass from pneumonia. 2. COPD. Electronically Signed   By: Monte Fantasia M.D.   On: 05/01/2016 12:34   Ct Head W & Wo Contrast  Result Date: 05/02/2016 CLINICAL DATA:  Altered mental status. Recently diagnosed lung cancer. EXAM: CT HEAD WITHOUT AND WITH CONTRAST TECHNIQUE: Contiguous axial images were obtained from the base of the skull through the vertex without and with intravenous contrast CONTRAST:  20m ISOVUE-300 IOPAMIDOL (ISOVUE-300) INJECTION 61% COMPARISON:  Head CT 04/26/2016 FINDINGS: Brain: There is [name] focal contrast-enhancing lesion within the right cerebellum that measures 5 mm. No other enhancing lesions are identified. There is periventricular hypoattenuation compatible with chronic microvascular disease. No mass effect or midline shift. No hydrocephalus or extra-axial collection. No acute hemorrhage. Vascular: Atherosclerotic arterial calcifications of the skullbase. Skull: No skull fracture or focal calvarial lesion. Visualized skull base is normal. Sinuses/Orbits: There is complete opacification of the right maxillary sinus.  Chronic right lamina papyracea fracture. Otherwise normal orbits. IMPRESSION: 1. Single enhancing metastatic lesion within the right cerebellar hemisphere, measuring 5 mm. MRI is recommended to evaluate the complete extent of intracranial metastatic disease. 2. No mass effect, hemorrhage or evidence of acute infarct. Electronically Signed   By: KUlyses JarredM.D.   On: 05/02/2016 05:01   Ct Chest W Contrast  Result Date: 05/02/2016 CLINICAL DATA:  Right upper lobe masses EXAM: CT CHEST WITH CONTRAST TECHNIQUE: Multidetector CT imaging of the chest was performed during intravenous contrast administration. CONTRAST:  745mISOVUE-300 IOPAMIDOL (ISOVUE-300) INJECTION 61% COMPARISON:  Chest radiograph 05/01/2016 FINDINGS: Cardiovascular: There is atherosclerotic calcification of the aortic arch and the proximal arch vessels. There is extensive coronary artery calcification. Heart size is within normal limits. No pericardial effusion. Mediastinum/Nodes: No mediastinal, hilar or axillary lymphadenopathy. The visualized thyroid and thoracic esophageal course are unremarkable. Lungs/Pleura: There is [name] mass in the medial right lung apex with areas of cavitation and necrosis, measuring altogether 3.6 x 3.6 cm. This abuts the pleura in there is loss of the normal mediastinal fat surrounding the esophagus, suggesting [name] degree of invasion into the mediastinum. 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 There is [name] masslike area of consolidation within the anterior lingula that measures 2.4 x 1.9 cm. No pleural effusion. Additional smaller nodular opacity in the right upper lobe measures 10 x 7 mm. Upper Abdomen: No acute abnormality. Musculoskeletal: No chest wall abnormality. No acute or significant osseous findings. IMPRESSION: 1. Necrotic/cavitary right upper lobe mass with loss of adjacent mediastinal fat planes suggesting possible invasion into the mediastinum. The appearance is most  suggestive of squamous cell carcinoma. [name] cavitary infectious process is considered less likely. 2. Suspected satellite nodule in the right upper lobe measuring up to 1 cm. 3. Masslike consolidation in the lingula is also suspicious for neoplasm. Follow-up imaging would be helpful to determine if this focus may be infectious or inflammatory. 4. Aortic and coronary artery atherosclerosis. Electronically Signed   By: KeUlyses Jarred.D.   On: 05/02/2016 02:26    Assessment/Plan [age]o male with h/o etoh abuse comes in with delirium, hallucinations, personality changes found to have RUL lung mass with lesion also to right cerebellum  Principal Problem:   Delirium with hallucinations - may be due to underlying malignancy.  Admit to medical bed for further work up,  see below.  Check bmp and cbc  Active Problems:   Hyponatremia- noted, mild  Checking bmp and cbc now   AAA (abdominal aortic aneurysm, ruptured) ([place])- stable   Alcohol abuse- pt over 5 days out of last etoh drink   Lesion of lung- consult both pulmonology and oncology to move forward with tissue biopsy   Brain metastases [facility])- order MRI brain   His daughter is NOT aware of these new findings, will need to discuss with her in the am.  I have discussed these findings of concerns for lung cancer with the patient himself, he agrees to moving forward for further work up.  DVT prophylaxis:  scds Code Status:  full Family Communication: none Disposition Plan:  Per day team Consults called:  pulm and oncology requested in epic Admission status:  admission   Charles Mccaster A MD [facility]  If 7PM-7AM, please contact night-coverage [email or web address] Password [facility]  05/02/2016, 5:47 AM

## 2016-05-02 NOTE — ED Provider Notes (Signed)
Patient had a chest x-ray which was abnormal and CT with contrast of his chest was done to further evaluate the abnormal chest x-ray. The CT showed a necrotic, cavitary right upper lobe mass with loss of adjacent mediastinal fat planes suggesting a possible invasion into the mediastinum. It was felt to be suggestive of a squamous cell carcinoma. Infectious process was felt to be less likely. He also had a masslike consolidation of the lingula that was felt to be neoplasm rather than infectious. Because of this and his change in mental status a CT of the brain with IV contrast was ordered. It is also abnormal MRI of the brain was recommended to look for further metastatic disease. I'm going to consult with the hospitalist about possible medical admission. Patient has been in the ED for many days waiting to go to [facility]. He was last seen in the ED on November 24 although he has been seen in the ED on the 22nd twice in the 23rd.   05:36 AM Dr Shanon Brow, hospitalist, admit to med-surg, repeat labs  Dg Chest 2 View  Result Date: 05/01/2016 CLINICAL DATA:  Cough and congestion. EXAM: CHEST  2 VIEW COMPARISON:  04/23/2016 FINDINGS: COPD with hyperinflation and emphysematous changes. There is a right paramediastinal opacity that appears new from 2014. No internal air bronchograms are noted. No edema, effusion, or pneumothorax. Normal heart size.  Aortic atherosclerosis. IMPRESSION: 1. Medial right apex opacity which is new from 2014. Recommend chest CT to differentiate mass from pneumonia. 2. COPD. Electronically Signed   By: Monte Fantasia M.D.   On: 05/01/2016 12:34   Ct Head W & Wo Contrast  Result Date: 05/02/2016 CLINICAL DATA:  Altered mental status. Recently diagnosed lung cancer. EXAM: CT HEAD WITHOUT AND WITH CONTRAST TECHNIQUE: Contiguous axial images were obtained from the base of the skull through the vertex without and with intravenous contrast CONTRAST:  34m ISOVUE-300 IOPAMIDOL  (ISOVUE-300) INJECTION 61% COMPARISON:  Head CT 04/26/2016 FINDINGS: Brain: There is a focal contrast-enhancing lesion within the right cerebellum that measures 5 mm. No other enhancing lesions are identified. There is periventricular hypoattenuation compatible with chronic microvascular disease. No mass effect or midline shift. No hydrocephalus or extra-axial collection. No acute hemorrhage. Vascular: Atherosclerotic arterial calcifications of the skullbase. Skull: No skull fracture or focal calvarial lesion. Visualized skull base is normal. Sinuses/Orbits: There is complete opacification of the right maxillary sinus. Chronic right lamina papyracea fracture. Otherwise normal orbits. IMPRESSION: 1. Single enhancing metastatic lesion within the right cerebellar hemisphere, measuring 5 mm. MRI is recommended to evaluate the complete extent of intracranial metastatic disease. 2. No mass effect, hemorrhage or evidence of acute infarct. Electronically Signed   By: KUlyses JarredM.D.   On: 05/02/2016 05:01   Ct Chest W Contrast  Result Date: 05/02/2016 CLINICAL DATA:  Right upper lobe masses EXAM: CT CHEST WITH CONTRAST TECHNIQUE: Multidetector CT imaging of the chest was performed during intravenous contrast administration. CONTRAST:  766mISOVUE-300 IOPAMIDOL (ISOVUE-300) INJECTION 61% COMPARISON:  Chest radiograph 05/01/2016 FINDINGS: Cardiovascular: There is atherosclerotic calcification of the aortic arch and the proximal arch vessels. There is extensive coronary artery calcification. Heart size is within normal limits. No pericardial effusion. Mediastinum/Nodes: No mediastinal, hilar or axillary lymphadenopathy. The visualized thyroid and thoracic esophageal course are unremarkable. Lungs/Pleura: There is a mass in the medial right lung apex with areas of cavitation and necrosis, measuring altogether 3.6 x 3.6 cm. This abuts the pleura in there is loss of the  normal mediastinal fat surrounding the esophagus,  suggesting a degree of invasion into the mediastinum. 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 There is a masslike area of consolidation within the anterior lingula that measures 2.4 x 1.9 cm. No pleural effusion. Additional smaller nodular opacity in the right upper lobe measures 10 x 7 mm. Upper Abdomen: No acute abnormality. Musculoskeletal: No chest wall abnormality. No acute or significant osseous findings. IMPRESSION: 1. Necrotic/cavitary right upper lobe mass with loss of adjacent mediastinal fat planes suggesting possible invasion into the mediastinum. The appearance is most suggestive of squamous cell carcinoma. A cavitary infectious process is considered less likely. 2. Suspected satellite nodule in the right upper lobe measuring up to 1 cm. 3. Masslike consolidation in the lingula is also suspicious for neoplasm. Follow-up imaging would be helpful to determine if this focus may be infectious or inflammatory. 4. Aortic and coronary artery atherosclerosis. Electronically Signed   By: Ulyses Jarred M.D.   On: 05/02/2016 02:26    Plan medical admission  Rolland Porter, MD, Barbette Or, MD 05/02/16 [phone]

## 2016-05-02 NOTE — Progress Notes (Signed)
Subjective: Patient admitted this morning, see detailed H&P by Dr Shanon Brow 70 y.o. male with medical history significant of alcohol abuse, AAA, HTN was brought to the ED last week by his daughter for hallucinations and delirium.  Pt has been in ED awaiting psych placement since nov 24.  I have spoken to his safety sitter who has been sitting with him daily since before thanksgiving.  He has been hallucinating for over a week.  It was initially thought this was due to etoh withdrawal.  He was started on CIWA protocol.  Now it is day 6 of CIWA and pt is still having some hallucinations.  He started coughing tonight so a cxr was done which showed a lesion therefore a ct scan of his chest was done which shows a lesion concerning for malignancy.  A ct head with contrast was then done tonight which shows a possible metastatic lesion.  Pt was then referred for medical work up of his likely underlying malignancy causing his changes in mental status.  Pt  says he has been loosing weight.  He says he has been coughing up some blood especially in the mornings. Both oncology and pulmonary has been consulted. Patient might require biopsy from lung mass  This morning he complains of chest pain  Vitals:   05/02/16 0643 05/02/16 0840  BP: 131/60   Pulse: 92   Resp: 15   Temp:  99.3 F (37.4 C)      A/P  Lung mass Metastatic brain lesion Chest pain  Pulmonary and oncology consulted Start Vicodin when necessary for pain   [facility] [phone]

## 2016-05-02 NOTE — Progress Notes (Signed)
4720 Report given to Val, LPN on dept 721 who is receiving patient to room #329. Patient stable at this time and transporting via w/c by staff to dept 300.

## 2016-05-02 NOTE — Progress Notes (Signed)
1313 Patient noted more agitated and anxious stating "I need to see a [place] marshall now! 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 I have my own doctors and lawyers." MD notified.

## 2016-05-03 DIAGNOSIS — R911 Solitary pulmonary nodule: Secondary | ICD-10-CM

## 2016-05-03 DIAGNOSIS — R918 Other nonspecific abnormal finding of lung field: Secondary | ICD-10-CM

## 2016-05-03 MED ORDER — DEXAMETHASONE SODIUM PHOSPHATE 4 MG/ML IJ SOLN
4.0000 mg | Freq: Four times a day (QID) | INTRAMUSCULAR | Status: DC
  Administered 2016-05-03 – 2016-05-04 (×3): 4 mg via INTRAVENOUS
  Filled 2016-05-03 (×4): qty 1

## 2016-05-03 MED ORDER — LORAZEPAM 2 MG/ML IJ SOLN
2.0000 mg | INTRAMUSCULAR | Status: DC | PRN
  Administered 2016-05-03 – 2016-05-14 (×22): 2 mg via INTRAVENOUS
  Filled 2016-05-03 (×23): qty 1

## 2016-05-03 NOTE — Consult Note (Signed)
Consult requested by: [facility] Consult requested for presumed lung cancer:  HPI: This is a 70 year old who came to the hospital because of hallucinations and delirium. He actually came to the emergency department on November 24 and has been in the emergency department awaiting psych placement since that time. He has problems with alcohol abuse of abdominal aortic aneurysm hypertension and COPD. He started coughing in the emergency department had a chest x-ray which was abnormal and had a CT and the CT shows what appears to be lung cancer. He has been very agitated although this morning he is calm  than descriptions. He is still confused. He's not really able to give any significant history so the history is from the medical record. He has been losing weight. He's also been coughing up some blood.  Past Medical History:  Diagnosis Date  . AAA (abdominal aortic aneurysm) ([place])   . Collapsed lung    s/p fall, rib fracture  . Hypercholesteremia   . Hypertension   . Salmonella bacteremia      Family History  Problem Relation Age of Onset  . Diabetes Mother   . Diabetes Father      Social History   Social History  . Marital status: Single    Spouse name: N/A  . Number of children: N/A  . Years of education: N/A   Social History Main Topics  . Smoking status: Current Every Day Smoker    Packs/day: 0.50    Types: Cigarettes  . Smokeless tobacco: Never Used  . Alcohol use Yes     Comment: Daily - 1/2 pt liquor; 2 40 oz beers -- per hx  . Drug use: No  . Sexual activity: No   Other Topics Concern  . None   Social History Narrative  . None     ROS: Not obtainable because of his mental status    Objective: Vital signs in last 24 hours: Temp:  [98.3 F (36.8 C)-98.5 F (36.9 C)] 98.3 F (36.8 C) (12/02 0542) Pulse Rate:  [63-90] 90 (12/02 0542) Resp:  [12-20] 18 (12/02 0542) BP: (112-162)/(65-85) 112/65 (12/02 0542) SpO2:  [96 %-99 %] 97 % (12/02  0542) Weight change:     Intake/Output from previous day: 12/01 0701 - 12/02 0700 In: 360 [P.O.:360] Out: 1100 [Urine:1100]  PHYSICAL EXAM Constitutional: He looks thin. He is confused but calm. Eyes: Pupils react. EOMI. Ears nose mouth and throat: His mucous membranes are moist. Cardiovascular: His heart is regular without gallop. No edema. Respiratory: His lungs are clear with markedly diminished breath sounds. Gastrointestinal: His abdomen is soft without masses bowel sounds are present. Musculoskeletal: No swollen joints. Neurologic: He is still confused but he is much less agitated than previous description.  Lab Results: Basic Metabolic Panel:  Recent Labs  05/02/16 0537  NA 133*  K 3.6  CL 103  CO2 24  GLUCOSE 125*  BUN 12  CREATININE 0.79  CALCIUM 8.1*   Liver Function Tests:  Recent Labs  05/02/16 0537  AST 28  ALT 20  ALKPHOS 45  BILITOT 0.2*  PROT 6.5  ALBUMIN 2.6*   No results for input(s): LIPASE, AMYLASE in the last 72 hours. No results for input(s): AMMONIA in the last 72 hours. CBC:  Recent Labs  05/02/16 0537  WBC 6.2  NEUTROABS 3.9  HGB 10.4*  HCT 31.8*  MCV 99.4  PLT 159   Cardiac Enzymes: No results for input(s): CKTOTAL, CKMB, CKMBINDEX, TROPONINI in the last 72 hours.  BNP: No results for input(s): PROBNP in the last 72 hours. D-Dimer: No results for input(s): DDIMER in the last 72 hours. CBG: No results for input(s): GLUCAP in the last 72 hours. Hemoglobin A1C: No results for input(s): HGBA1C in the last 72 hours. Fasting Lipid Panel: No results for input(s): CHOL, HDL, LDLCALC, TRIG, CHOLHDL, LDLDIRECT in the last 72 hours. Thyroid Function Tests: No results for input(s): TSH, T4TOTAL, FREET4, T3FREE, THYROIDAB in the last 72 hours. Anemia Panel: No results for input(s): VITAMINB12, FOLATE, FERRITIN, TIBC, IRON, RETICCTPCT in the last 72 hours. Coagulation: No results for input(s): LABPROT, INR in the last 72 hours. Urine  Drug Screen: Drugs of Abuse     Component Value Date/Time   LABOPIA NONE DETECTED 04/23/2016 1837   COCAINSCRNUR NONE DETECTED 04/23/2016 1837   LABBENZ NONE DETECTED 04/23/2016 1837   AMPHETMU NONE DETECTED 04/23/2016 1837   THCU POSITIVE (A) 04/23/2016 1837   LABBARB NONE DETECTED 04/23/2016 1837    Alcohol Level: No results for input(s): ETH in the last 72 hours. Urinalysis: No results for input(s): COLORURINE, LABSPEC, PHURINE, GLUCOSEU, HGBUR, BILIRUBINUR, KETONESUR, PROTEINUR, UROBILINOGEN, NITRITE, LEUKOCYTESUR in the last 72 hours.  Invalid input(s): APPERANCEUR Misc. Labs:   ABGS: No results for input(s): PHART, PO2ART, TCO2, HCO3 in the last 72 hours.  Invalid input(s): PCO2   MICROBIOLOGY: Recent Results (from the past 240 hour(s))  MRSA PCR Screening     Status: None   Collection Time: 05/02/16  8:33 AM  Result Value Ref Range Status   MRSA by PCR NEGATIVE NEGATIVE Final    Comment:        The GeneXpert MRSA Assay (FDA approved for NASAL specimens only), is one component of a comprehensive MRSA colonization surveillance program. It is not intended to diagnose MRSA infection nor to guide or monitor treatment for MRSA infections.     Studies/Results: Dg Chest 2 View  Result Date: 05/01/2016 CLINICAL DATA:  Cough and congestion. EXAM: CHEST  2 VIEW COMPARISON:  04/23/2016 FINDINGS: COPD with hyperinflation and emphysematous changes. There is a right paramediastinal opacity that appears new from 2014. No internal air bronchograms are noted. No edema, effusion, or pneumothorax. Normal heart size.  Aortic atherosclerosis. IMPRESSION: 1. Medial right apex opacity which is new from 2014. Recommend chest CT to differentiate mass from pneumonia. 2. COPD. Electronically Signed   By: Monte Fantasia M.D.   On: 05/01/2016 12:34   Ct Head W & Wo Contrast  Result Date: 05/02/2016 CLINICAL DATA:  Altered mental status. Recently diagnosed lung cancer. EXAM: CT HEAD  WITHOUT AND WITH CONTRAST TECHNIQUE: Contiguous axial images were obtained from the base of the skull through the vertex without and with intravenous contrast CONTRAST:  3m ISOVUE-300 IOPAMIDOL (ISOVUE-300) INJECTION 61% COMPARISON:  Head CT 04/26/2016 FINDINGS: Brain: There is a focal contrast-enhancing lesion within the right cerebellum that measures 5 mm. No other enhancing lesions are identified. There is periventricular hypoattenuation compatible with chronic microvascular disease. No mass effect or midline shift. No hydrocephalus or extra-axial collection. No acute hemorrhage. Vascular: Atherosclerotic arterial calcifications of the skullbase. Skull: No skull fracture or focal calvarial lesion. Visualized skull base is normal. Sinuses/Orbits: There is complete opacification of the right maxillary sinus. Chronic right lamina papyracea fracture. Otherwise normal orbits. IMPRESSION: 1. Single enhancing metastatic lesion within the right cerebellar hemisphere, measuring 5 mm. MRI is recommended to evaluate the complete extent of intracranial metastatic disease. 2. No mass effect, hemorrhage or evidence of acute infarct. Electronically Signed  By: Ulyses Jarred M.D.   On: 05/02/2016 05:01   Ct Chest W Contrast  Result Date: 05/02/2016 CLINICAL DATA:  Right upper lobe masses EXAM: CT CHEST WITH CONTRAST TECHNIQUE: Multidetector CT imaging of the chest was performed during intravenous contrast administration. CONTRAST:  42m ISOVUE-300 IOPAMIDOL (ISOVUE-300) INJECTION 61% COMPARISON:  Chest radiograph 05/01/2016 FINDINGS: Cardiovascular: There is atherosclerotic calcification of the aortic arch and the proximal arch vessels. There is extensive coronary artery calcification. Heart size is within normal limits. No pericardial effusion. Mediastinum/Nodes: No mediastinal, hilar or axillary lymphadenopathy. The visualized thyroid and thoracic esophageal course are unremarkable. Lungs/Pleura: There is a mass in the  medial right lung apex with areas of cavitation and necrosis, measuring altogether 3.6 x 3.6 cm. This abuts the pleura in there is loss of the normal mediastinal fat surrounding the esophagus, suggesting a degree of invasion into the mediastinum. 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 There is a masslike area of consolidation within the anterior lingula that measures 2.4 x 1.9 cm. No pleural effusion. Additional smaller nodular opacity in the right upper lobe measures 10 x 7 mm. Upper Abdomen: No acute abnormality. Musculoskeletal: No chest wall abnormality. No acute or significant osseous findings. IMPRESSION: 1. Necrotic/cavitary right upper lobe mass with loss of adjacent mediastinal fat planes suggesting possible invasion into the mediastinum. The appearance is most suggestive of squamous cell carcinoma. A cavitary infectious process is considered less likely. 2. Suspected satellite nodule in the right upper lobe measuring up to 1 cm. 3. Masslike consolidation in the lingula is also suspicious for neoplasm. Follow-up imaging would be helpful to determine if this focus may be infectious or inflammatory. 4. Aortic and coronary artery atherosclerosis. Electronically Signed   By: KUlyses JarredM.D.   On: 05/02/2016 02:26   Mr BJeri CosWDUContrast  Result Date: 05/02/2016 CLINICAL DATA:  New diagnosis lung cancer.  Abnormal CT head. EXAM: MRI HEAD WITHOUT AND WITH CONTRAST TECHNIQUE: Multiplanar, multiecho pulse sequences of the brain and surrounding structures were obtained without and with intravenous contrast. CONTRAST:  922mMULTIHANCE GADOBENATE DIMEGLUMINE 529 MG/ML IV SOLN COMPARISON:  CT head 05/02/2016 FINDINGS: Brain: 5 mm enhancing lesion in the right cerebellum with central necrosis as noted on CT. Mild surrounding edema. This is consistent with metastatic disease. No other enhancing brain lesions identified. Image quality degraded by motion as well as artifact from  hair braids. Mild atrophy. Chronic microvascular ischemic changes in the white matter and basal ganglia. No acute infarct. Negative for hemorrhage. Pituitary normal in size. Vascular: Normal arterial flow voids. Skull and upper cervical spine: Abnormal hypo intensity in the C2 and C3 vertebral bodies worrisome for bony metastatic disease. No lesions in the calvarium identified. Sinuses/Orbits: Complete opacification of the right maxillary sinus. Chronic fracture of the right medial orbit. No orbital mass lesion. Other: None IMPRESSION: 5 mm enhancing metastatic deposit right cerebellum with mild surrounding edema. No other enhancing brain lesions Abnormality of the C2 and C3 vertebral bodies, concerning for metastatic disease to bone. Recommend cervical spine MRI without with contrast for further evaluation. Electronically Signed   By: ChFranchot Gallo.D.   On: 05/02/2016 08:20    Medications:  Prior to Admission:  Prescriptions Prior to Admission  Medication Sig Dispense Refill Last Dose  . acetaminophen (TYLENOL) 325 MG tablet Take 1-2 tablets (325-650 mg total) by mouth every 4 (four) hours as needed. (Patient taking differently: Take 325-650 mg by mouth every 4 (four) hours as  needed for mild pain. )    at unknown  . ibuprofen (ADVIL,MOTRIN) 200 MG tablet Take 800 mg by mouth every 6 (six) hours as needed.    at unknown  . LORazepam (ATIVAN) 1 MG tablet Take 1 tablet (1 mg total) by mouth every 6 (six) hours as needed for anxiety (or shakiness). 12 tablet 0  at unknown   Scheduled: . sodium chloride flush  3 mL Intravenous Q12H   Continuous:  OMQ:TTCNGF chloride, HYDROcodone-acetaminophen, ondansetron **OR** ondansetron (ZOFRAN) IV, sodium chloride flush  Assesment: He was admitted with delirium and hallucinations. He is better although he still confused. He has a history of alcohol abuse and that's being treated. Chest x-ray shows mass lesion and he also has what appears to be metastatic  disease to the brain. I agree that in his current mental status he is not a candidate for treatment or for invasive biopsy. If he improves this can be reconsidered Principal Problem:   Delirium Active Problems:   Hyponatremia   AAA (abdominal aortic aneurysm, ruptured) ([facility])   Alcohol abuse   Lesion of lung   Brain metastases ([place])   Lung mass    Plan: I will plan to follow with you  Thanks for allowing me to see him with you    LOS: 1 day   Tura Roller L 05/03/2016, 10:39 AM

## 2016-05-03 NOTE — Progress Notes (Signed)
[facility]  PROGRESS NOTE  Charles [name] TKZ:[id number] DOB: Oct 11, 1945 DOA: 04/25/2016 PCP: No PCP Per Patient   Brief HPI:    70 year old male who was brought to the ED on November 24 for delirium with hallucinations. He has been awaiting psych placement. During this time he continued to have hallucinations and had fluctuating level of awareness. Patient started coughing and ED and a chest x-ray which was abnormal which was followed by CT chest which showed lung mass. CT head with contrast showed single enhancing metastatic lesion in the right cerebellar hemisphere measuring 5 mm. Confirmed with MRI which showed mild surrounding edema.    Subjective   This morning patient is very calm and answering all questions appropriately. He knows that he is in the hospital in [place], was able to tell me correct month and date.    Assessment/Plan:     1. Delirium- likely from alcohol withdrawal, he also has 5 mm cerebellar mass with minimal surrounding edema. Continue Ativan when necessary. 2. Metastatic brain lesion- patient has 5 mm cerebellar mass with minimal surrounding edema and no significant mass effect, I discussed with neurologist on call on 05/02/2016. He did not think that patient had mental status changes from this small metastatic lesion. Patient has been started on Decadron 4 mg every 6 hours. 3. Lung mass- CT chest showed necrotic/cavitary right upper lobe mass with loss of adjacent P December fat plane suggesting possible invasion into mediastinum. 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 Also showed masslike consolidation in the lingula suspicious for neoplasm. Pulmonary has been consulted, plan is for lung biopsy when patient's mental condition improves. Oncology also has seen the patient and will follow once biopsies obtained. 4. Alcohol abuse- continue Ativan when necessary patient is slowly improving. 5. Status post abdominal aortic aneurysm repair-  stable    DVT prophylaxis: SCDs  Code Status: Full code  Family Communication: Discussed with patient's daughter on phone   Disposition Plan: *To be decided   Consultants:  Pulmonary  Oncology  Procedures:  None  Continuous infusions  KVO    Antibiotics:   Anti-infectives    None       Objective   Vitals:   05/02/16 1600 05/02/16 1700 05/02/16 2134 05/03/16 0542  BP: (!) 147/85 (!) 149/79 120/75 112/65  Pulse: 72 69 68 90  Resp: '20 12 18 18  '$ Temp:   98.4 F (36.9 C) 98.3 F (36.8 C)  TempSrc:   Oral Oral  SpO2: 97% 99% 99% 97%  Weight:      Height:        Intake/Output Summary (Last 24 hours) at 05/03/16 1351 Last data filed at 05/03/16 1300  Gross per 24 hour  Intake              480 ml  Output              500 ml  Net              -20 ml   Filed Weights   04/25/16 1110 05/02/16 0823  Weight: 45.4 kg (100 lb) 44.5 kg (98 lb 1.7 oz)     Physical Examination:  General exam: Appears calm and comfortable. Respiratory system: Clear to auscultation. Respiratory effort normal. Cardiovascular system:  RRR. No  murmurs, rubs, gallops. No pedal edema. GI system: Abdomen is nondistended, soft and nontender. No organomegaly.  Central nervous system. No focal neurological deficits. 5 x 5 power in all extremities. Skin: No rashes, lesions  or ulcers. Psychiatry: Alert, oriented x 2.Patient lacks clear judgment and insight at this time.    Data Reviewed: I have personally reviewed following labs and imaging studies  CBG: No results for input(s): GLUCAP in the last 168 hours.  CBC:  Recent Labs Lab 05/02/16 0537  WBC 6.2  NEUTROABS 3.9  HGB 10.4*  HCT 31.8*  MCV 99.4  PLT 161    Basic Metabolic Panel:  Recent Labs Lab 04/29/16 1506 05/02/16 0537  NA 132* 133*  K 3.8 3.6  CL 101 103  CO2 26 24  GLUCOSE 97 125*  BUN 16 12  CREATININE 0.61 0.79  CALCIUM 8.2* 8.1*    Recent Results (from the past 240 hour(s))  MRSA PCR  Screening     Status: None   Collection Time: 05/02/16  8:33 AM  Result Value Ref Range Status   MRSA by PCR NEGATIVE NEGATIVE Final    Comment:        The GeneXpert MRSA Assay (FDA approved for NASAL specimens only), is one component of a comprehensive MRSA colonization surveillance program. It is not intended to diagnose MRSA infection nor to guide or monitor treatment for MRSA infections.      Liver Function Tests:  Recent Labs Lab 05/02/16 0537  AST 28  ALT 20  ALKPHOS 45  BILITOT 0.2*  PROT 6.5  ALBUMIN 2.6*   No results for input(s): LIPASE, AMYLASE in the last 168 hours. No results for input(s): AMMONIA in the last 168 hours.  Cardiac Enzymes:  Recent Labs Lab 04/29/16 1506  TROPONINI <0.03      Studies: Ct Head W & Wo Contrast  Result Date: 05/02/2016 CLINICAL DATA:  Altered mental status. Recently diagnosed lung cancer. EXAM: CT HEAD WITHOUT AND WITH CONTRAST TECHNIQUE: Contiguous axial images were obtained from the base of the skull through the vertex without and with intravenous contrast CONTRAST:  18m ISOVUE-300 IOPAMIDOL (ISOVUE-300) INJECTION 61% COMPARISON:  Head CT 04/26/2016 FINDINGS: Brain: There is a focal contrast-enhancing lesion within the right cerebellum that measures 5 mm. No other enhancing lesions are identified. There is periventricular hypoattenuation compatible with chronic microvascular disease. No mass effect or midline shift. No hydrocephalus or extra-axial collection. No acute hemorrhage. Vascular: Atherosclerotic arterial calcifications of the skullbase. Skull: No skull fracture or focal calvarial lesion. Visualized skull base is normal. Sinuses/Orbits: There is complete opacification of the right maxillary sinus. Chronic right lamina papyracea fracture. Otherwise normal orbits. IMPRESSION: 1. Single enhancing metastatic lesion within the right cerebellar hemisphere, measuring 5 mm. MRI is recommended to evaluate the complete extent of  intracranial metastatic disease. 2. No mass effect, hemorrhage or evidence of acute infarct. Electronically Signed   By: KUlyses JarredM.D.   On: 05/02/2016 05:01   Ct Chest W Contrast  Result Date: 05/02/2016 CLINICAL DATA:  Right upper lobe masses EXAM: CT CHEST WITH CONTRAST TECHNIQUE: Multidetector CT imaging of the chest was performed during intravenous contrast administration. CONTRAST:  713mISOVUE-300 IOPAMIDOL (ISOVUE-300) INJECTION 61% COMPARISON:  Chest radiograph 05/01/2016 FINDINGS: Cardiovascular: There is atherosclerotic calcification of the aortic arch and the proximal arch vessels. There is extensive coronary artery calcification. Heart size is within normal limits. No pericardial effusion. Mediastinum/Nodes: No mediastinal, hilar or axillary lymphadenopathy. The visualized thyroid and thoracic esophageal course are unremarkable. Lungs/Pleura: There is a mass in the medial right lung apex with areas of cavitation and necrosis, measuring altogether 3.6 x 3.6 cm. This abuts the pleura in there is loss of the normal mediastinal  fat surrounding the esophagus, suggesting a degree of invasion into the mediastinum. 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 There is a masslike area of consolidation within the anterior lingula that measures 2.4 x 1.9 cm. No pleural effusion. Additional smaller nodular opacity in the right upper lobe measures 10 x 7 mm. Upper Abdomen: No acute abnormality. Musculoskeletal: No chest wall abnormality. No acute or significant osseous findings. IMPRESSION: 1. Necrotic/cavitary right upper lobe mass with loss of adjacent mediastinal fat planes suggesting possible invasion into the mediastinum. The appearance is most suggestive of squamous cell carcinoma. A cavitary infectious process is considered less likely. 2. Suspected satellite nodule in the right upper lobe measuring up to 1 cm. 3. Masslike consolidation in the lingula is also suspicious  for neoplasm. Follow-up imaging would be helpful to determine if this focus may be infectious or inflammatory. 4. Aortic and coronary artery atherosclerosis. Electronically Signed   By: Ulyses Jarred M.D.   On: 05/02/2016 02:26   Mr Jeri Cos QZ Contrast  Result Date: 05/02/2016 CLINICAL DATA:  New diagnosis lung cancer.  Abnormal CT head. EXAM: MRI HEAD WITHOUT AND WITH CONTRAST TECHNIQUE: Multiplanar, multiecho pulse sequences of the brain and surrounding structures were obtained without and with intravenous contrast. CONTRAST:  62m MULTIHANCE GADOBENATE DIMEGLUMINE 529 MG/ML IV SOLN COMPARISON:  CT head 05/02/2016 FINDINGS: Brain: 5 mm enhancing lesion in the right cerebellum with central necrosis as noted on CT. Mild surrounding edema. This is consistent with metastatic disease. No other enhancing brain lesions identified. Image quality degraded by motion as well as artifact from hair braids. Mild atrophy. Chronic microvascular ischemic changes in the white matter and basal ganglia. No acute infarct. Negative for hemorrhage. Pituitary normal in size. Vascular: Normal arterial flow voids. Skull and upper cervical spine: Abnormal hypo intensity in the C2 and C3 vertebral bodies worrisome for bony metastatic disease. No lesions in the calvarium identified. Sinuses/Orbits: Complete opacification of the right maxillary sinus. Chronic fracture of the right medial orbit. No orbital mass lesion. Other: None IMPRESSION: 5 mm enhancing metastatic deposit right cerebellum with mild surrounding edema. No other enhancing brain lesions Abnormality of the C2 and C3 vertebral bodies, concerning for metastatic disease to bone. Recommend cervical spine MRI without with contrast for further evaluation. Electronically Signed   By: CFranchot GalloM.D.   On: 05/02/2016 08:20    Scheduled Meds: . dexamethasone  4 mg Intravenous Q6H  . sodium chloride flush  3 mL Intravenous Q12H      Time spent: 25 min  LGlen AubreyHospitalists Pager [phone] If 7PM-7AM, please contact night-coverage at [email or web address], Office  [phone] password [email or web address] 05/03/2016, 1:51 PM  LOS: 1 day

## 2016-05-04 MED ORDER — LORAZEPAM 2 MG/ML IJ SOLN
1.0000 mg | Freq: Once | INTRAMUSCULAR | Status: AC
  Administered 2016-05-04: 1 mg via INTRAVENOUS
  Filled 2016-05-04: qty 1

## 2016-05-04 MED ORDER — DEXAMETHASONE 4 MG PO TABS
4.0000 mg | ORAL_TABLET | Freq: Every day | ORAL | Status: DC
  Administered 2016-05-04: 4 mg via ORAL
  Filled 2016-05-04 (×5): qty 1

## 2016-05-04 NOTE — Progress Notes (Signed)
[facility]  PROGRESS NOTE  Charles [name] TKZ:[id number] DOB: 19-Nov-1945 DOA: 04/25/2016 PCP: No PCP Per Patient   Brief HPI:    70 year old male who was brought to the ED on November 24 for delirium with hallucinations. He has been awaiting psych placement. During this time he continued to have hallucinations and had fluctuating level of awareness. Patient started coughing and ED and a chest x-ray which was abnormal which was followed by CT chest which showed lung mass. CT head with contrast showed single enhancing metastatic lesion in the right cerebellar hemisphere measuring 5 mm. Confirmed with MRI which showed mild surrounding edema.    Subjective   This morning patient is confused. Was started on IV Decadron yesterday.   Assessment/Plan:     1. Delirium- likely from alcohol withdrawal, he also has 5 mm cerebellar mass with minimal surrounding edema. Patient was started on IV Decadron yesterday, likely has worsening of confusion from Decadron. Dose of Decadron has been changed to 40 mg by mouth daily by Dr. Luan Pulling. 2. Metastatic brain lesion- patient has 5 mm cerebellar mass with minimal surrounding edema and no significant mass effect, I discussed with neurologist on call on 05/02/2016. He did not think that patient had mental status changes from this small metastatic lesion. Patient has been started on Decadron 4 mg every 6 hours, which is now changed to 4 mg by mouth daily. 3. Lung mass- CT chest showed necrotic/cavitary right upper lobe mass with loss of adjacent P December fat plane suggesting possible invasion into mediastinum. 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 Also showed masslike consolidation in the lingula suspicious for neoplasm. Pulmonary has been consulted, plan is for lung biopsy when patient's mental condition improves. Oncology also has seen the patient and will follow once biopsies obtained. 4. Alcohol abuse- continue Ativan when necessary  patient is slowly improving. 5. Status post abdominal aortic aneurysm repair- stable    DVT prophylaxis: SCDs  Code Status: Full code  Family Communication: Discussed with patient's daughter on phone   Disposition Plan: *To be decided   Consultants:  Pulmonary  Oncology  Procedures:  None  Continuous infusions  KVO    Antibiotics:   Anti-infectives    None       Objective   Vitals:   05/03/16 0542 05/03/16 1400 05/03/16 2038 05/04/16 0532  BP: 112/65 (!) 144/78 137/77 121/88  Pulse: 90 61 82 73  Resp: '18 18 20 20  '$ Temp: 98.3 F (36.8 C) 98.1 F (36.7 C) 98.9 F (37.2 C) 97.5 F (36.4 C)  TempSrc: Oral  Oral Oral  SpO2: 97% 99% 98% 98%  Weight:      Height:        Intake/Output Summary (Last 24 hours) at 05/04/16 1507 Last data filed at 05/03/16 2148  Gross per 24 hour  Intake              203 ml  Output               50 ml  Net              153 ml   Filed Weights   04/25/16 1110 05/02/16 0823  Weight: 45.4 kg (100 lb) 44.5 kg (98 lb 1.7 oz)     Physical Examination:  General exam: Appears calm and comfortable. Respiratory system: Clear to auscultation. Respiratory effort normal. Cardiovascular system:  RRR. No  murmurs, rubs, gallops. No pedal edema. GI system: Abdomen is nondistended, soft and nontender.  No organomegaly.  Central nervous system. No focal neurological deficits. 5 x 5 power in all extremities. Skin: No rashes, lesions or ulcers. Psychiatry: Alert, oriented x 2.Patient lacks clear judgment and insight at this time.    Data Reviewed: I have personally reviewed following labs and imaging studies  CBG: No results for input(s): GLUCAP in the last 168 hours.  CBC:  Recent Labs Lab 05/02/16 0537  WBC 6.2  NEUTROABS 3.9  HGB 10.4*  HCT 31.8*  MCV 99.4  PLT 481    Basic Metabolic Panel:  Recent Labs Lab 04/29/16 1506 05/02/16 0537  NA 132* 133*  K 3.8 3.6  CL 101 103  CO2 26 24  GLUCOSE 97 125*  BUN 16  12  CREATININE 0.61 0.79  CALCIUM 8.2* 8.1*    Recent Results (from the past 240 hour(s))  MRSA PCR Screening     Status: None   Collection Time: 05/02/16  8:33 AM  Result Value Ref Range Status   MRSA by PCR NEGATIVE NEGATIVE Final    Comment:        The GeneXpert MRSA Assay (FDA approved for NASAL specimens only), is one component of a comprehensive MRSA colonization surveillance program. It is not intended to diagnose MRSA infection nor to guide or monitor treatment for MRSA infections.      Liver Function Tests:  Recent Labs Lab 05/02/16 0537  AST 28  ALT 20  ALKPHOS 45  BILITOT 0.2*  PROT 6.5  ALBUMIN 2.6*   No results for input(s): LIPASE, AMYLASE in the last 168 hours. No results for input(s): AMMONIA in the last 168 hours.  Cardiac Enzymes:  Recent Labs Lab 04/29/16 1506  TROPONINI <0.03      Studies: No results found.  Scheduled Meds: . dexamethasone  4 mg Oral Daily  . sodium chloride flush  3 mL Intravenous Q12H      Time spent: 25 min  [facility] Pager [phone]. If 7PM-7AM, please contact night-coverage at [email or web address], Office  [phone]  password [email or web address] 05/04/2016, 3:07 PM  LOS: 2 days

## 2016-05-04 NOTE — Progress Notes (Addendum)
Subjective: He is much more confused and agitated this morning. He is disoriented. He says he needs a [place] with him to discuss his health. No other new complaints but it's not clear that this is accurate. He denies chest pain shortness of breath cough sputum production nausea vomiting or diarrhea  Objective: Vital signs in last 24 hours: Temp:  [97.5 F (36.4 C)-98.9 F (37.2 C)] 97.5 F (36.4 C) (12/03 0532) Pulse Rate:  [61-82] 73 (12/03 0532) Resp:  [18-20] 20 (12/03 0532) BP: (121-144)/(77-88) 121/88 (12/03 0532) SpO2:  [98 %-99 %] 98 % (12/03 0532) Weight change:     Intake/Output from previous day: 12/02 0701 - 12/03 0700 In: 323 [P.O.:320; I.V.:3] Out: 50 [Urine:50]  PHYSICAL EXAM General appearance: alert and Very confused. Somewhat cachectic Resp: rhonchi bilaterally Cardio: regular rate and rhythm, S1, S2 normal, no murmur, click, rub or gallop GI: soft, non-tender; bowel sounds normal; no masses,  no organomegaly Extremities: extremities normal, atraumatic, no cyanosis or edema Skin warm and dry. Mucous membranes slightly dry. Pupils are reactive. EOMI.  Lab Results:  No results found for this or any previous visit (from the past 48 hour(s)).  ABGS No results for input(s): PHART, PO2ART, TCO2, HCO3 in the last 72 hours.  Invalid input(s): PCO2 CULTURES Recent Results (from the past 240 hour(s))  MRSA PCR Screening     Status: None   Collection Time: 05/02/16  8:33 AM  Result Value Ref Range Status   MRSA by PCR NEGATIVE NEGATIVE Final    Comment:        The GeneXpert MRSA Assay (FDA approved for NASAL specimens only), is one component of a comprehensive MRSA colonization surveillance program. It is not intended to diagnose MRSA infection nor to guide or monitor treatment for MRSA infections.    Studies/Results: No results found.  Medications:  Prior to Admission:  Prescriptions Prior to Admission  Medication Sig Dispense Refill Last Dose   . acetaminophen (TYLENOL) 325 MG tablet Take 1-2 tablets (325-650 mg total) by mouth every 4 (four) hours as needed. (Patient taking differently: Take 325-650 mg by mouth every 4 (four) hours as needed for mild pain. )    at unknown  . ibuprofen (ADVIL,MOTRIN) 200 MG tablet Take 800 mg by mouth every 6 (six) hours as needed.    at unknown  . LORazepam (ATIVAN) 1 MG tablet Take 1 tablet (1 mg total) by mouth every 6 (six) hours as needed for anxiety (or shakiness). 12 tablet 0  at unknown   Scheduled: . dexamethasone  4 mg Oral Daily  . sodium chloride flush  3 mL Intravenous Q12H   Continuous:  UDJ:SHFWYO chloride, HYDROcodone-acetaminophen, LORazepam, ondansetron **OR** ondansetron (ZOFRAN) IV, sodium chloride flush  Assesment: He was admitted with delirium. This is likely related to alcohol abuse and alcohol withdrawal. Additionally he has a lung mass presumably lung cancer with what appears to be a metastatic brain lesion. I put him on Decadron yesterday but with minimal edema surrounding the area I think it's okay to put him on by mouth now. Per oncology without significant change in his mental status he is not a candidate for any treatment. Yesterday he was calm and much more alert and oriented but today he's back to very confused and disorientation. Principal Problem:   Delirium Active Problems:   Hyponatremia   AAA (abdominal aortic aneurysm, ruptured) (HCC)   Alcohol abuse   Lesion of lung   Brain metastases (HCC)   Lung mass  Plan: Stopl Decadron. His family apparently wants biopsy done regardless of his situation. I don't think it's safe at this point. If his mental status clears we can consider    LOS: 2 days   Meko Masterson L 05/04/2016, 9:33 AM

## 2016-05-05 NOTE — Progress Notes (Signed)
[facility]  PROGRESS NOTE  Charles [name] CXK:[id number] DOB: 1946/05/10 DOA: 04/25/2016 PCP: No PCP Per Patient   Brief HPI:    70 year old male who was brought to the ED on November 24 for delirium with hallucinations. He has been awaiting psych placement. During this time he continued to have hallucinations and had fluctuating level of awareness. Patient started coughing and ED and a chest x-ray which was abnormal which was followed by CT chest which showed lung mass. CT head with contrast showed single enhancing metastatic lesion in the right cerebellar hemisphere measuring 5 mm. Confirmed with MRI which showed mild surrounding edema.    Subjective   This morning patient's mental status is better.   Assessment/Plan:     1. Delirium- slowly improving,  likely from alcohol withdrawal, he also has 5 mm cerebellar mass with minimal surrounding edema. Patient was started on IV Decadron yesterday, likely has worsening of confusion from Decadron. Dose of Decadron has been changed to 40 mg by mouth daily by Dr. Luan Pulling. 2. Metastatic brain lesion- patient has 5 mm cerebellar mass with minimal surrounding edema and no significant mass effect, I discussed with neurologist on call on 05/02/2016. He did not think that patient had mental status changes from this small metastatic lesion. Patient has been started on Decadron 4 mg every 6 hours, which is now changed to 4 mg by mouth daily. 3. Lung mass- CT chest showed necrotic/cavitary right upper lobe mass with loss of adjacent P December fat plane suggesting possible invasion into mediastinum. 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 Also showed masslike consolidation in the lingula suspicious for neoplasm. Pulmonary has been consulted, plan is for lung biopsy  with bronchoscopy with propofol in am. 4. Alcohol abuse- continue Ativan when necessary patient is slowly improving. 5. Status post abdominal aortic aneurysm repair-  stable    DVT prophylaxis: SCDs  Code Status: Full code  Family Communication: Discussed with patient's daughter on phone   Disposition Plan: *To be decided   Consultants:  Pulmonary  Oncology  Procedures:  None  Continuous infusions  KVO    Antibiotics:   Anti-infectives    None       Objective   Vitals:   05/04/16 0532 05/04/16 1723 05/04/16 2047 05/05/16 0627  BP: 121/88 (!) 160/82 (!) 165/81 132/77  Pulse: 73 73 71 68  Resp: '20 20 20 20  '$ Temp: 97.5 F (36.4 C) 98 F (36.7 C) 97.5 F (36.4 C) 98 F (36.7 C)  TempSrc: Oral Oral Oral Oral  SpO2: 98% 99% 100% 100%  Weight:      Height:        Intake/Output Summary (Last 24 hours) at 05/05/16 1420 Last data filed at 05/05/16 0830  Gross per 24 hour  Intake              240 ml  Output                0 ml  Net              240 ml   Filed Weights   04/25/16 1110 05/02/16 0823  Weight: 45.4 kg (100 lb) 44.5 kg (98 lb 1.7 oz)     Physical Examination:  General exam: Appears calm and comfortable. Respiratory system: Clear to auscultation. Respiratory effort normal. Cardiovascular system:  RRR. No  murmurs, rubs, gallops. No pedal edema. GI system: Abdomen is nondistended, soft and nontender. No organomegaly.  Central nervous system. No focal neurological  deficits. 5 x 5 power in all extremities. Skin: No rashes, lesions or ulcers. Psychiatry: Alert, oriented x 2.Patient lacks clear judgment and insight at this time.    Data Reviewed: I have personally reviewed following labs and imaging studies  CBG: No results for input(s): GLUCAP in the last 168 hours.  CBC:  Recent Labs Lab 05/02/16 0537  WBC 6.2  NEUTROABS 3.9  HGB 10.4*  HCT 31.8*  MCV 99.4  PLT 466    Basic Metabolic Panel:  Recent Labs Lab 04/29/16 1506 05/02/16 0537  NA 132* 133*  K 3.8 3.6  CL 101 103  CO2 26 24  GLUCOSE 97 125*  BUN 16 12  CREATININE 0.61 0.79  CALCIUM 8.2* 8.1*    Recent Results (from  the past 240 hour(s))  MRSA PCR Screening     Status: None   Collection Time: 05/02/16  8:33 AM  Result Value Ref Range Status   MRSA by PCR NEGATIVE NEGATIVE Final    Comment:        The GeneXpert MRSA Assay (FDA approved for NASAL specimens only), is one component of a comprehensive MRSA colonization surveillance program. It is not intended to diagnose MRSA infection nor to guide or monitor treatment for MRSA infections.      Liver Function Tests:  Recent Labs Lab 05/02/16 0537  AST 28  ALT 20  ALKPHOS 45  BILITOT 0.2*  PROT 6.5  ALBUMIN 2.6*   No results for input(s): LIPASE, AMYLASE in the last 168 hours. No results for input(s): AMMONIA in the last 168 hours.  Cardiac Enzymes:  Recent Labs Lab 04/29/16 1506  TROPONINI <0.03      Studies: No results found.  Scheduled Meds: . dexamethasone  4 mg Oral Daily  . sodium chloride flush  3 mL Intravenous Q12H      Time spent: 25 min  [facility] Pager [phone]. If 7PM-7AM, please contact night-coverage at [email or web address], Office  [phone]  password [email or web address] 05/05/2016, 2:20 PM  LOS: 3 days

## 2016-05-05 NOTE — Progress Notes (Signed)
Subjective: He says he feels better. He is calm and alert and not delusional this morning. We discussed his CT scan. He agrees to go ahead with bronchoscopy.  Objective: Vital signs in last 24 hours: Temp:  [97.5 F (36.4 C)-98 F (36.7 C)] 98 F (36.7 C) (12/04 0627) Pulse Rate:  [68-73] 68 (12/04 0627) Resp:  [20] 20 (12/04 0627) BP: (132-165)/(77-82) 132/77 (12/04 0627) SpO2:  [99 %-100 %] 100 % (12/04 0627) Weight change:     Intake/Output from previous day: 12/03 0701 - 12/04 0700 In: 480 [P.O.:480] Out: -   PHYSICAL EXAM General appearance: alert, cooperative and no distress Resp: rhonchi bilaterally Cardio: regular rate and rhythm, S1, S2 normal, no murmur, click, rub or gallop GI: soft, non-tender; bowel sounds normal; no masses,  no organomegaly Extremities: extremities normal, atraumatic, no cyanosis or edema His pupils are reactive. EOMI. Mucous membranes are still slightly dry. Skin warm and dry  Lab Results:  No results found for this or any previous visit (from the past 48 hour(s)).  ABGS No results for input(s): PHART, PO2ART, TCO2, HCO3 in the last 72 hours.  Invalid input(s): PCO2 CULTURES Recent Results (from the past 240 hour(s))  MRSA PCR Screening     Status: None   Collection Time: 05/02/16  8:33 AM  Result Value Ref Range Status   MRSA by PCR NEGATIVE NEGATIVE Final    Comment:        The GeneXpert MRSA Assay (FDA approved for NASAL specimens only), is one component of a comprehensive MRSA colonization surveillance program. It is not intended to diagnose MRSA infection nor to guide or monitor treatment for MRSA infections.    Studies/Results: No results found.  Medications:  Prior to Admission:  Prescriptions Prior to Admission  Medication Sig Dispense Refill Last Dose  . acetaminophen (TYLENOL) 325 MG tablet Take 1-2 tablets (325-650 mg total) by mouth every 4 (four) hours as needed. (Patient taking differently: Take 325-650 mg by  mouth every 4 (four) hours as needed for mild pain. )    at unknown  . ibuprofen (ADVIL,MOTRIN) 200 MG tablet Take 800 mg by mouth every 6 (six) hours as needed.    at unknown  . LORazepam (ATIVAN) 1 MG tablet Take 1 tablet (1 mg total) by mouth every 6 (six) hours as needed for anxiety (or shakiness). 12 tablet 0  at unknown   Scheduled: . dexamethasone  4 mg Oral Daily  . sodium chloride flush  3 mL Intravenous Q12H   Continuous:  LJQ:GBEEFE chloride, HYDROcodone-acetaminophen, LORazepam, ondansetron **OR** ondansetron (ZOFRAN) IV, sodium chloride flush  Assesment: He was admitted with delirium probably from alcohol withdrawal. He is better. He has what appears to be a lung cancer. He agrees to bronchoscopy. I think this will be done best with propofol Principal Problem:   Delirium Active Problems:   Hyponatremia   AAA (abdominal aortic aneurysm, ruptured) (HCC)   Alcohol abuse   Lesion of lung   Brain metastases (HCC)   Lung mass    Plan: Bronchoscopy tomorrow with propofol    LOS: 3 days   Charles [name] L 05/05/2016, 8:24 AM

## 2016-05-05 NOTE — Clinical Social Work Note (Signed)
CSW received consult for substance abuse. Discussed with MD. Not appropriate at this time for assessment due to new cancer diagnosis and pt's mental status. Please re-consult if appropriate.  Benay Pike, [place]

## 2016-05-06 ENCOUNTER — Inpatient Hospital Stay (HOSPITAL_COMMUNITY): Payer: Medicare Other | Admitting: Anesthesiology

## 2016-05-06 ENCOUNTER — Encounter (HOSPITAL_COMMUNITY): Admission: EM | Disposition: A | Discharge status: Skilled Nursing Facility | Payer: Self-pay | Source: Home / Self Care

## 2016-05-06 ENCOUNTER — Encounter (HOSPITAL_COMMUNITY): Payer: Self-pay | Admitting: Anesthesiology

## 2016-05-06 HISTORY — PX: FLEXIBLE BRONCHOSCOPY: SHX5094

## 2016-05-06 HISTORY — PX: BRONCHIAL WASHINGS: SHX5105

## 2016-05-06 HISTORY — PX: BRONCHIAL BRUSHINGS: SHX5108

## 2016-05-06 SURGERY — BRONCHOSCOPY, FLEXIBLE
Anesthesia: Monitor Anesthesia Care | Laterality: Bilateral

## 2016-05-06 MED ORDER — LIDOCAINE VISCOUS 2 % MT SOLN
OROMUCOSAL | Status: AC
  Filled 2016-05-06: qty 15

## 2016-05-06 MED ORDER — PROPOFOL 10 MG/ML IV BOLUS
INTRAVENOUS | Status: DC | PRN
  Administered 2016-05-06: 20 mg via INTRAVENOUS

## 2016-05-06 MED ORDER — LIDOCAINE HCL (PF) 2 % IJ SOLN
INTRAMUSCULAR | Status: AC
  Filled 2016-05-06: qty 10

## 2016-05-06 MED ORDER — CHLORHEXIDINE GLUCONATE CLOTH 2 % EX PADS: 6.0000 | MEDICATED_PAD | Freq: Once | CUTANEOUS | Status: DC

## 2016-05-06 MED ORDER — PROPOFOL 500 MG/50ML IV EMUL
INTRAVENOUS | Status: DC | PRN
  Administered 2016-05-06: 50 ug/kg/min via INTRAVENOUS

## 2016-05-06 MED ORDER — LACTATED RINGERS IV SOLN
INTRAVENOUS | Status: DC
  Administered 2016-05-06: 14:00:00 via INTRAVENOUS

## 2016-05-06 SURGICAL SUPPLY — 16 items
BRUSH CYTOL CELLEBRITY 1.5X140 (MISCELLANEOUS) ×4 IMPLANT
CLOTH BEACON ORANGE TIMEOUT ST (SAFETY) ×4 IMPLANT
CONNECTOR 5 IN 1 STRAIGHT STRL (MISCELLANEOUS) ×4 IMPLANT
FORCEPS BIOP RJ4 1.8 (CUTTING FORCEPS) ×4 IMPLANT
GLOVE BIO SURGEON STRL SZ7.5 (GLOVE) ×4 IMPLANT
KIT CLEAN CATCH URINE (SET/KITS/TRAYS/PACK) IMPLANT
MARKER SKIN DUAL TIP RULER LAB (MISCELLANEOUS) ×4 IMPLANT
NS IRRIG 1000ML POUR BTL (IV SOLUTION) ×4 IMPLANT
SPONGE GAUZE 4X4 12PLY (GAUZE/BANDAGES/DRESSINGS) ×4 IMPLANT
SYR 20CC LL (SYRINGE) ×4 IMPLANT
SYR 30ML LL (SYRINGE) ×4 IMPLANT
SYR CONTROL 10ML LL (SYRINGE) ×4 IMPLANT
TRAP SPECIMEN CP (MISCELLANEOUS) ×4 IMPLANT
VALVE DISPOSABLE (MISCELLANEOUS) ×4 IMPLANT
WATER STERILE IRR 1000ML POUR (IV SOLUTION) ×4 IMPLANT
YANKAUER SUCT BULB TIP 10FT TU (MISCELLANEOUS) ×8 IMPLANT

## 2016-05-06 NOTE — OR Nursing (Signed)
Voided time one small amount yellow urine.

## 2016-05-06 NOTE — Anesthesia Preprocedure Evaluation (Addendum)
Anesthesia Evaluation  Patient identified by MRN, date of birth, ID band Patient confused    Reviewed: Allergy & Precautions, NPO status , Patient's Chart, lab work & pertinent test results, Unable to perform ROS - Chart review only  Airway Mallampati: II  TM Distance: >3 FB     Dental  (+) Poor Dentition, Missing   Pulmonary Current Smoker (RUL lung mass and hemoptysis),     + decreased breath sounds      Cardiovascular hypertension, + Peripheral Vascular Disease (s/p AAA repair)  + dysrhythmias Atrial Fibrillation  Rhythm:Regular Rate:Normal     Neuro/Psych PSYCHIATRIC DISORDERS (admitted with hallucinations and delirium.)    GI/Hepatic (+)     substance abuse  alcohol use,   Endo/Other    Renal/GU      Musculoskeletal   Abdominal   Peds  Hematology   Anesthesia Other Findings Uncooperative with exam, responds poorly to commands. History obtained from chart.  Reproductive/Obstetrics                            Anesthesia Physical Anesthesia Plan  ASA: IV  Anesthesia Plan: MAC   Post-op Pain Management:    Induction: Intravenous  Airway Management Planned: Simple Face Mask  Additional Equipment:   Intra-op Plan:   Post-operative Plan:   Informed Consent: I have reviewed the patients History and Physical, chart, labs and discussed the procedure including the risks, benefits and alternatives for the proposed anesthesia with the patient or authorized representative who has indicated his/her understanding and acceptance.     Plan Discussed with:   Anesthesia Plan Comments:         Anesthesia Quick Evaluation

## 2016-05-06 NOTE — Progress Notes (Signed)
Spoke with pt.'s daughter. She states she will not give permission for bronchoscopy until she talks to Dr. Luan Pulling. States she will call his office.

## 2016-05-06 NOTE — Anesthesia Postprocedure Evaluation (Signed)
Anesthesia Post Note  Patient: Charles [name]  Procedure(s) Performed: Procedure(s) (LRB): FLEXIBLE BRONCHOSCOPY WITH PROPOFOL (Bilateral)  Patient location during evaluation: PACU Anesthesia Type: MAC Level of consciousness: awake and alert and oriented Pain management: pain level controlled Vital Signs Assessment: post-procedure vital signs reviewed and stable Respiratory status: spontaneous breathing Cardiovascular status: blood pressure returned to baseline Postop Assessment: no signs of nausea or vomiting Anesthetic complications: no    Last Vitals:  Vitals:   05/06/16 0639 05/06/16 1314  BP: 118/62   Pulse: 74   Resp: 18 (!) 26  Temp: 36.7 C     Last Pain:  Vitals:   05/06/16 0639  TempSrc: Oral  PainSc:                  Tressie Stalker

## 2016-05-06 NOTE — Plan of Care (Signed)
Problem: Education: Goal: Knowledge of [facility] Education information/materials will improve Outcome: Not Progressing Pt disoriented and unable to understand teaching. Pt found leaving room and shouting in hallway. Nursing staff redirected pt back to his room. Security notified as pt began to verbally escalate. Pt sat on the side of his bed and states, "I am a prisoner of the state. I want to speak to the [place]. It's my dam right to speak to the [place]."   Problem: Safety: Goal: Ability to remain free from injury will improve Outcome: Not Progressing Safety sitter in place. Pt given Ativan to help him calm down due to becoming aggressive and being anxious.  Problem: Pain Managment: Goal: General experience of comfort will improve Outcome: Not Progressing Pt did tell the nurse he was in pain in his chest. Pain was assessed and medication given.   Problem: Physical Regulation: Goal: Ability to maintain clinical measurements within normal limits will improve Outcome: Progressing See doc flowsheet. Goal: Will remain free from infection Outcome: Not Progressing See admitting diagnosis and history.  Problem: Tissue Perfusion: Goal: Risk factors for ineffective tissue perfusion will decrease Outcome: Not Progressing Pt refuses SCDs.

## 2016-05-06 NOTE — Transfer of Care (Signed)
Immediate Anesthesia Transfer of Care Note  Patient: Charles [name]  Procedure(s) Performed: Procedure(s): FLEXIBLE BRONCHOSCOPY WITH PROPOFOL (Bilateral)  Patient Location: PACU  Anesthesia Type:MAC  Level of Consciousness: awake  Airway & Oxygen Therapy: Patient Spontanous Breathing  Post-op Assessment: Report given to RN  Post vital signs: Reviewed and stable  Last Vitals:  Vitals:   05/06/16 0639 05/06/16 1314  BP: 118/62   Pulse: 74   Resp: 18 (!) 26  Temp: 36.7 C     Last Pain:  Vitals:   05/06/16 0639  TempSrc: Oral  PainSc:       Patients Stated Pain Goal: 2 (82/42/35 3614)  Complications: No apparent anesthesia complications

## 2016-05-06 NOTE — Progress Notes (Signed)
He is scheduled for bronchoscopy today. We discussed the reasons for the procedure and he wants to proceed. I will plan to do this with propofol because of his chronic alcohol abuse and my concerns that I will not be able to have him sedated enough with conscious sedation

## 2016-05-06 NOTE — Op Note (Signed)
Bronchoscopy Procedure Note Charles [name] [id number] 13-Dec-1945  Procedure: Bronchoscopy Indications: Diagnostic evaluation of the airways and Obtain specimens for culture and/or other diagnostic studies  Procedure Details Consent: Risks of procedure as well as the alternatives and risks of each were explained to the (patient/caregiver).  Consent for procedure obtained. Time Out: Verified patient identification, verified procedure, site/side was marked, verified correct patient position, special equipment/implants available, medications/allergies/relevent history reviewed, required imaging and test results available.  Performed  In preparation for procedure, bronchoscope lubricated. Sedation: Propofol  Airway entered and the following bronchi were examined: RUL, RML, RLL, LUL, LLL and Bronchi.   Procedures performed: Brushings performed washings performed Bronchoscope removed.    Evaluation Hemodynamic Status: BP stable throughout; O2 sats: stable throughout Patient's Current Condition: stable Specimens:  Sent serosanguinous fluid Complications: No apparent complications Patient did tolerate procedure well.   Charles [name] 05/06/2016

## 2016-05-06 NOTE — Care Management Note (Signed)
Case Management Note  Patient Details  Name: Charles [name] MRN: [id number] Date of Birth: 13-Feb-1946  Subjective/Objective:    Patient adm from home with AMS. Spent several days in ER awaiting psych placement while on CIWA. CT done that reveal lung mass with mets to brain, patient then admitted. Still confused today, having bronchoscopy today as well.            Action/Plan: Following for needs.   Expected Discharge Date:    05/08/2016              Expected Discharge Plan:  Skilled Nursing Facility  In-House Referral:  Clinical Social Work  Discharge planning Services  CM Consult  Post Acute Care Choice:    Choice offered to:     DME Arranged:    DME Agency:     HH Arranged:    [place] Agency:     Status of Service:  In process, will continue to follow  If discussed at Long Length of Stay Meetings, dates discussed:    Additional Comments:  Tsugio Elison, Chauncey Reading, RN 05/06/2016, 12:57 PM

## 2016-05-06 NOTE — Progress Notes (Signed)
[facility]  PROGRESS NOTE  Charles [name] MVH:[id number] DOB: July 04, 1945 DOA: 04/25/2016 PCP: No PCP Per Patient   Brief HPI:    70 year old male who was brought to the ED on November 24 for delirium with hallucinations. He has been awaiting psych placement. During this time he continued to have hallucinations and had fluctuating level of awareness. Patient started coughing and ED and a chest x-ray which was abnormal which was followed by CT chest which showed lung mass. CT head with contrast showed single enhancing metastatic lesion in the right cerebellar hemisphere measuring 5 mm. Confirmed with MRI which showed mild surrounding edema.    Subjective   This morning patient to go for bronchoscopy today.   Assessment/Plan:     1. Delirium- slowly improving,  likely from alcohol withdrawal, he also has 5 mm cerebellar mass with minimal surrounding edema. Patient was started on IV Decadron yesterday, likely has worsening of confusion from Decadron. Dose of Decadron has been changed to 40 mg by mouth daily by Dr. Luan Pulling. 2. Metastatic brain lesion- patient has 5 mm cerebellar mass with minimal surrounding edema and no significant mass effect, I discussed with neurologist on call on 05/02/2016. He did not think that patient had mental status changes from this small metastatic lesion. Patient has been started on Decadron 4 mg every 6 hours, which is now changed to 4 mg by mouth daily. 3. Lung mass- CT chest showed necrotic/cavitary right upper lobe mass with loss of adjacent P December fat plane suggesting possible invasion into mediastinum. 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 Also showed masslike consolidation in the lingula suspicious for neoplasm. Pulmonary has been consulted, plan is for lung biopsy  with bronchoscopy with propofol today 4. Alcohol abuse- continue Ativan when necessary patient is slowly improving. 5. Status post abdominal aortic aneurysm repair-  stable    DVT prophylaxis: SCDs  Code Status: Full code  Family Communication: Discussed with patient's daughter on phone   Disposition Plan: *To be decided   Consultants:  Pulmonary  Oncology  Procedures:  None  Continuous infusions  KVO    Antibiotics:   Anti-infectives    None       Objective   Vitals:   05/06/16 1505 05/06/16 1515 05/06/16 1530 05/06/16 1551  BP: 125/68 126/77 124/76 124/75  Pulse: 70 71 74 75  Resp: (!) '24 20 15 18  '$ Temp: 97.6 F (36.4 C)   97.6 F (36.4 C)  TempSrc:    Oral  SpO2: 100% 100% 96% 100%  Weight:      Height:        Intake/Output Summary (Last 24 hours) at 05/06/16 1642 Last data filed at 05/06/16 1508  Gross per 24 hour  Intake              300 ml  Output                0 ml  Net              300 ml   Filed Weights   04/25/16 1110 05/02/16 0823  Weight: 45.4 kg (100 lb) 44.5 kg (98 lb 1.7 oz)     Physical Examination:  General exam: Appears calm and comfortable. Respiratory system: Clear to auscultation. Respiratory effort normal. Cardiovascular system:  RRR. No  murmurs, rubs, gallops. No pedal edema. GI system: Abdomen is nondistended, soft and nontender. No organomegaly.  Central nervous system. No focal neurological deficits. 5 x 5 power in all  extremities. Skin: No rashes, lesions or ulcers. Psychiatry: Alert, oriented x 2.Patient lacks clear judgment and insight at this time.    Data Reviewed: I have personally reviewed following labs and imaging studies  CBG: No results for input(s): GLUCAP in the last 168 hours.  CBC:  Recent Labs Lab 05/02/16 0537  WBC 6.2  NEUTROABS 3.9  HGB 10.4*  HCT 31.8*  MCV 99.4  PLT 183    Basic Metabolic Panel:  Recent Labs Lab 05/02/16 0537  NA 133*  K 3.6  CL 103  CO2 24  GLUCOSE 125*  BUN 12  CREATININE 0.79  CALCIUM 8.1*    Recent Results (from the past 240 hour(s))  MRSA PCR Screening     Status: None   Collection Time: 05/02/16   8:33 AM  Result Value Ref Range Status   MRSA by PCR NEGATIVE NEGATIVE Final    Comment:        The GeneXpert MRSA Assay (FDA approved for NASAL specimens only), is one component of a comprehensive MRSA colonization surveillance program. It is not intended to diagnose MRSA infection nor to guide or monitor treatment for MRSA infections.      Liver Function Tests:  Recent Labs Lab 05/02/16 0537  AST 28  ALT 20  ALKPHOS 45  BILITOT 0.2*  PROT 6.5  ALBUMIN 2.6*   No results for input(s): LIPASE, AMYLASE in the last 168 hours. No results for input(s): AMMONIA in the last 168 hours.  Cardiac Enzymes: No results for input(s): CKTOTAL, CKMB, CKMBINDEX, TROPONINI in the last 168 hours.    Studies: No results found.  Scheduled Meds: . sodium chloride flush  3 mL Intravenous Q12H      Time spent: 25 min  [facility] Pager [phone]. If 7PM-7AM, please contact night-coverage at [email or web address], Office  [phone]  password [email or web address] 05/06/2016, 4:42 PM  LOS: 4 days

## 2016-05-07 DIAGNOSIS — I713 Abdominal aortic aneurysm, ruptured: Secondary | ICD-10-CM

## 2016-05-07 MED ORDER — DEXAMETHASONE 4 MG PO TABS
2.0000 mg | ORAL_TABLET | Freq: Two times a day (BID) | ORAL | Status: DC
  Administered 2016-05-07 – 2016-05-14 (×14): 2 mg via ORAL
  Filled 2016-05-07 (×20): qty 0.5

## 2016-05-07 NOTE — Addendum Note (Signed)
Addendum  created 05/07/16 1516 by Charmaine Downs, CRNA   Sign clinical note

## 2016-05-07 NOTE — Progress Notes (Addendum)
PROGRESS NOTE    DQUAN CORTOPASSI  QBH:[id number] DOB: 16-Oct-1945 DOA: 04/25/2016 PCP: No PCP Per Patient    Brief Narrative:  70 year old male who was brought to the ED on November 24 for delirium with hallucinations. He has been awaiting psych placement. During this time he continued to have hallucinations and had fluctuating level of awareness. Patient started coughing and ED and a chest x-ray which was abnormal which was followed by CT chest which showed lung mass. CT head with contrast showed single enhancing metastatic lesion in the right cerebellar hemisphere measuring 5 mm. Confirmed with MRI which showed mild surrounding edema.   Assessment & Plan:   Principal Problem:   Delirium Active Problems:   Hyponatremia   AAA (abdominal aortic aneurysm, ruptured) (HCC)   Alcohol abuse   Lesion of lung   Brain metastases (HCC)   Lung mass  Delirium-  - slowly improving - he also has 5 mm cerebellar mass with minimal surrounding edema - Patient was started on IV Decadron yesterday - Decadron 2 mg by mouth BID per Dr. Luan Pulling. - Consulted pyschiatry again today however they state they have nothing to offer given patient's brain tumor - will need to consider alternative medication to control patient behavior without sedating him - read article patient's daughter referenced about medications appropriate for patients with brain tumors and psychiatric disturbances and at this time it seems as though haloperidol may be an alternative to the ativan he is on or perhaps a lower dose of ativan.  I don't know if any of the medications in the article referenced will control patient's behaviors without sedating him though  Metastatic brain lesion - patient has 5 mm cerebellar mass with minimal surrounding edema and no significant mass effect - discussed with neurologist on call on 05/02/2016 (did not think that patient had mental status changes from this small metastatic lesion) - daughter  believes this is cause of patient's change in mental status - oncology suggesting hospice on 12/1  Lung mass - CT chest showed necrotic/cavitary right upper lobe mass with loss of adjacent P December fat plane suggesting possible invasion into mediastinum (most suggestive of squamous cell carcinoma); Also showed masslike consolidation in the lingula suspicious for neoplasm. - Pulmonary has been consulted - patient underwent bronchoscopy yesterday - daughter would like to wait for pathology results  Alcohol abuse - continue Ativan when necessary patient is slowly improving.  Status post abdominal aortic aneurysm repair - stable   DVT prophylaxis: SCDs Code Status: Full Code Family Communication: Called patient's daughter and spoke with her at length about oncology recommendations written on the 12/1 (which was hospice).  Daughter does not want to pursue this option or any other options other than current treatment plan until pathology results Disposition Plan: pending on bronchoscopy results  Consultants:   Pulmonology  Oncology  Psychiatry  Procedures:   Bronchoscopy 12/5  Antimicrobials:   None    Subjective: Patient asleep at time of exam.  He is arousable but falls back to sleep easily.  Was aggressive overnight and beligirent with staff.  Discussed with daughter  Objective: Vitals:   05/06/16 1551 05/06/16 2132 05/06/16 2200 05/07/16 0951  BP: 124/75  129/69   Pulse: 75  76 71  Resp: '18  18 18  '$ Temp: 97.6 F (36.4 C)  97.5 F (36.4 C) 98.2 F (36.8 C)  TempSrc: Oral  Oral Oral  SpO2: 100% 96% 98% 99%  Weight:      Height:  Intake/Output Summary (Last 24 hours) at 05/07/16 1410 Last data filed at 05/06/16 1508  Gross per 24 hour  Intake              300 ml  Output                0 ml  Net              300 ml   Filed Weights   04/25/16 1110 05/02/16 0823  Weight: 45.4 kg (100 lb) 44.5 kg (98 lb 1.7 oz)    Examination:  General exam:  Appears calm and comfortable  Respiratory system: Clear to auscultation. Respiratory effort normal. Cardiovascular system: S1 & S2 heard, RRR. No JVD, murmurs, rubs, gallops or clicks. No pedal edema. Gastrointestinal system: Abdomen is nondistended, soft and nontender. No organomegaly or masses felt. Normal bowel sounds heard. Central nervous system: unable to assess. Extremities: unable to assess Skin: No rashes, lesions or ulcers Psychiatry: unable to assess    Data Reviewed: I have personally reviewed following labs and imaging studies  CBC:  Recent Labs Lab 05/02/16 0537  WBC 6.2  NEUTROABS 3.9  HGB 10.4*  HCT 31.8*  MCV 99.4  PLT 712   Basic Metabolic Panel:  Recent Labs Lab 05/02/16 0537  NA 133*  K 3.6  CL 103  CO2 24  GLUCOSE 125*  BUN 12  CREATININE 0.79  CALCIUM 8.1*   GFR: Estimated Creatinine Clearance: 52.5 mL/min (by C-G formula based on SCr of 0.79 mg/dL). Liver Function Tests:  Recent Labs Lab 05/02/16 0537  AST 28  ALT 20  ALKPHOS 45  BILITOT 0.2*  PROT 6.5  ALBUMIN 2.6*   No results for input(s): LIPASE, AMYLASE in the last 168 hours. No results for input(s): AMMONIA in the last 168 hours. Coagulation Profile: No results for input(s): INR, PROTIME in the last 168 hours. Cardiac Enzymes: No results for input(s): CKTOTAL, CKMB, CKMBINDEX, TROPONINI in the last 168 hours. BNP (last 3 results) No results for input(s): PROBNP in the last 8760 hours. HbA1C: No results for input(s): HGBA1C in the last 72 hours. CBG: No results for input(s): GLUCAP in the last 168 hours. Lipid Profile: No results for input(s): CHOL, HDL, LDLCALC, TRIG, CHOLHDL, LDLDIRECT in the last 72 hours. Thyroid Function Tests: No results for input(s): TSH, T4TOTAL, FREET4, T3FREE, THYROIDAB in the last 72 hours. Anemia Panel: No results for input(s): VITAMINB12, FOLATE, FERRITIN, TIBC, IRON, RETICCTPCT in the last 72 hours. Sepsis Labs: No results for input(s):  PROCALCITON, LATICACIDVEN in the last 168 hours.  Recent Results (from the past 240 hour(s))  MRSA PCR Screening     Status: None   Collection Time: 05/02/16  8:33 AM  Result Value Ref Range Status   MRSA by PCR NEGATIVE NEGATIVE Final    Comment:        The GeneXpert MRSA Assay (FDA approved for NASAL specimens only), is one component of a comprehensive MRSA colonization surveillance program. It is not intended to diagnose MRSA infection nor to guide or monitor treatment for MRSA infections.          Radiology Studies: No results found.      Scheduled Meds: . dexamethasone  2 mg Oral Q12H  . sodium chloride flush  3 mL Intravenous Q12H   Continuous Infusions:   LOS: 5 days    Time spent: 30 minutes    Loretha Stapler, MD [facility] Pager [phone]  If 7PM-7AM, please contact night-coverage [email or web address]  Password [email or web address] 05/07/2016, 2:10 PM

## 2016-05-07 NOTE — Progress Notes (Signed)
Subjective: He is sleepy this morning. He was agitated and received Ativan last night. I had a long discussion with his daughter by phone yesterday. She is an Therapist, sports and actually used to work in our intensive care unit but now lives in [place]. She is concerned that his confusion is being labeled as being alcohol withdrawal. She thinks he may have some other form of psychosis perhaps even including problems related to the metastatic disease to his brain although it does not appear to be causing any mass effect.  Objective: Vital signs in last 24 hours: Temp:  [97.5 F (36.4 C)-97.6 F (36.4 C)] 97.5 F (36.4 C) (12/05 2200) Pulse Rate:  [70-76] 76 (12/05 2200) Resp:  [15-26] 18 (12/05 2200) BP: (124-129)/(68-77) 129/69 (12/05 2200) SpO2:  [96 %-100 %] 98 % (12/05 2200) Weight change:  Last BM Date: 05/04/16  Intake/Output from previous day: 12/05 0701 - 12/06 0700 In: 300 [I.V.:300] Out: 0   PHYSICAL EXAM General appearance: Very sleepy. Previously agitated Resp: rhonchi bilaterally Cardio: regular rate and rhythm, S1, S2 normal, no murmur, click, rub or gallop GI: soft, non-tender; bowel sounds normal; no masses,  no organomegaly Extremities: extremities normal, atraumatic, no cyanosis or edema Mucous membranes are moist  Lab Results:  No results found for this or any previous visit (from the past 48 hour(s)).  ABGS No results for input(s): PHART, PO2ART, TCO2, HCO3 in the last 72 hours.  Invalid input(s): PCO2 CULTURES Recent Results (from the past 240 hour(s))  MRSA PCR Screening     Status: None   Collection Time: 05/02/16  8:33 AM  Result Value Ref Range Status   MRSA by PCR NEGATIVE NEGATIVE Final    Comment:        The GeneXpert MRSA Assay (FDA approved for NASAL specimens only), is one component of a comprehensive MRSA colonization surveillance program. It is not intended to diagnose MRSA infection nor to guide or monitor treatment for MRSA infections.     Studies/Results: No results found.  Medications:  Prior to Admission:  Prescriptions Prior to Admission  Medication Sig Dispense Refill Last Dose  . acetaminophen (TYLENOL) 325 MG tablet Take 1-2 tablets (325-650 mg total) by mouth every 4 (four) hours as needed. (Patient taking differently: Take 325-650 mg by mouth every 4 (four) hours as needed for mild pain. )    at unknown  . ibuprofen (ADVIL,MOTRIN) 200 MG tablet Take 800 mg by mouth every 6 (six) hours as needed.    at unknown  . LORazepam (ATIVAN) 1 MG tablet Take 1 tablet (1 mg total) by mouth every 6 (six) hours as needed for anxiety (or shakiness). 12 tablet 0  at unknown   Scheduled: . sodium chloride flush  3 mL Intravenous Q12H   Continuous:  XBJ:YNWGNF chloride, HYDROcodone-acetaminophen, LORazepam, ondansetron **OR** ondansetron (ZOFRAN) IV, sodium chloride flush  Assesment: He was admitted with delirium. I don't think it's quite clear what this is from. This is certainly a prolonged episode is related to alcohol withdrawal. I don't think Decadron will harm any things are going to restart that. He may need another psych consultation and then it would probably be appropriate if that's recommended to start him on antipsychotic medications and see if that helps. He had bronchoscopy yesterday and pathology is pending Principal Problem:   Delirium Active Problems:   Hyponatremia   AAA (abdominal aortic aneurysm, ruptured) (HCC)   Alcohol abuse   Lesion of lung   Brain metastases ([place])   Lung  mass    Plan: As above I have started Decadron in low-dose    LOS: 5 days   Taila Basinski L 05/07/2016, 8:15 AM

## 2016-05-07 NOTE — Anesthesia Postprocedure Evaluation (Signed)
Anesthesia Post Note  Patient: Marylynn Pearson  Procedure(s) Performed: Procedure(s) (LRB): FLEXIBLE BRONCHOSCOPY WITH PROPOFOL (Bilateral) BRONCHIAL BRUSHINGS BRONCHIAL WASHINGS  Patient location during evaluation: Nursing Unit Anesthesia Type: MAC Level of consciousness: awake and confused Pain management: pain level controlled Vital Signs Assessment: post-procedure vital signs reviewed and stable Respiratory status: spontaneous breathing, nonlabored ventilation and respiratory function stable Cardiovascular status: blood pressure returned to baseline Postop Assessment: no signs of nausea or vomiting Anesthetic complications: no    Last Vitals:  Vitals:   05/07/16 0951 05/07/16 1300  BP:  127/68  Pulse: 71 76  Resp: 18 18  Temp: 36.8 C 36.9 C    Last Pain:  Vitals:   05/07/16 1300  TempSrc: Oral  PainSc:                  Abuk Selleck J

## 2016-05-07 NOTE — Care Management Important Message (Signed)
Important Message  Patient Details  Name: Charles [name] MRN: [id number] Date of Birth: 1945/09/17   Medicare Important Message Given:  Yes    Javarri Segal, Chauncey Reading, RN 05/07/2016, 1:46 PM

## 2016-05-08 DIAGNOSIS — R410 Disorientation, unspecified: Secondary | ICD-10-CM

## 2016-05-08 DIAGNOSIS — Z79899 Other long term (current) drug therapy: Secondary | ICD-10-CM

## 2016-05-08 DIAGNOSIS — Z9889 Other specified postprocedural states: Secondary | ICD-10-CM

## 2016-05-08 DIAGNOSIS — F17210 Nicotine dependence, cigarettes, uncomplicated: Secondary | ICD-10-CM

## 2016-05-08 DIAGNOSIS — Z818 Family history of other mental and behavioral disorders: Secondary | ICD-10-CM

## 2016-05-08 MED ORDER — RISPERIDONE 0.5 MG PO TABS
0.2500 mg | ORAL_TABLET | Freq: Every day | ORAL | Status: DC
  Administered 2016-05-08: 0.25 mg via ORAL
  Filled 2016-05-08: qty 1

## 2016-05-08 NOTE — Progress Notes (Signed)
He is about the same. Pathology pending. I'm following somewhat more peripherally awaiting pathology results.

## 2016-05-08 NOTE — Progress Notes (Signed)
PROGRESS NOTE    Charles [name]  YPP:[id number] DOB: 02-12-1946 DOA: 04/25/2016 PCP: No PCP Per Patient    Brief Narrative:  70 year old male who was brought to the ED on November 24 for delirium with hallucinations. He has been awaiting psych placement. During this time he continued to have hallucinations and had fluctuating level of awareness. Patient started coughing and ED and a chest x-ray which was abnormal which was followed by CT chest which showed lung mass. CT head with contrast showed single enhancing metastatic lesion in the right cerebellar hemisphere measuring 5 mm. Confirmed with MRI which showed mild surrounding edema.   Assessment & Plan:   Principal Problem:   Delirium Active Problems:   Hyponatremia   AAA (abdominal aortic aneurysm, ruptured) (HCC)   Alcohol abuse   Lesion of lung   Brain metastases (HCC)   Lung mass  Delirium-  - he also has 5 mm cerebellar mass with minimal surrounding edema - Patient was started on IV Decadron yesterday - Decadron 2 mg by mouth BID per Dr. Luan Pulling. - Consulted pyschiatry again today however they state they have nothing to offer given patient's brain tumor - will need to consider alternative medication to control patient behavior without sedating him - per daughter Charles [name] who is a Designer, jewellery , patient was altered and accused her taking money from him during thanksgiving holiday when she took the patient to the hospital on 11/24 , he waited in the ED for 5 days for psych placement, initial though was alcohol withdrawal. While awaiting psy placement, he started to cough, cxr + lung lesion, CT head with contrast showed a brain metastasis and possible c2-c3 mets, (initial CT head without contrast on 11/25 was not able detect the brain lesion) _ patient is improving on 12/7, he knows he is at [facility], know it is December, but not oriented to the year, he does has tangential thoughts and poor insight. _i discussed  with daughter Charles [name], she agreed to start low dose risperdal, low dose started qhs on 12/7.  Metastatic brain lesion - patient has 5 mm cerebellar mass with minimal surrounding edema and no significant mass effect - discussed with neurologist on call on 05/02/2016 (did not think that patient had mental status changes from this small metastatic lesion) - daughter believes this is cause of patient's change in mental status - oncology suggesting hospice on 12/1 Daughter is wondering about radiation therapy for palliative purpose, I discussed with daughter that I am not sure patient will be cooperative and able to lay still for xrt to the brain, but she can certainly explore the possibility if patient's mental status improves  Lung mass - CT chest showed necrotic/cavitary right upper lobe mass with loss of adjacent P December fat plane suggesting possible invasion into mediastinum (most suggestive of squamous cell carcinoma); Also showed masslike consolidation in the lingula suspicious for neoplasm. - Pulmonary has been consulted - patient underwent bronchoscopy  - daughter would like to wait for pathology results before making decision about hospice  Alcohol abuse - continue Ativan when necessary patient is slowly improving.  Status post abdominal aortic aneurysm repair - stable   DVT prophylaxis: SCDs Code Status: Full Code Family Communication: Called patient's daughter Charles [name] on 12/7.  Daughter is waiting on biopsy result to make further decisions Disposition Plan: pending on bronchoscopy results, if malignancy confirmed, daughter possibly will get patient home with home hospice.  Consultants:   Pulmonology  Oncology  Psychiatry  Procedures:  Bronchoscopy 12/5  Antimicrobials:   None    Subjective: Awake and pleasant during encounter today, he dose has tangential thoughts and pressed speech  Objective: Vitals:   05/07/16 0951 05/07/16 1300 05/07/16 2247 05/08/16  0639  BP:  127/68 138/68 116/71  Pulse: 71 76 78 72  Resp: '18 18 16 20  '$ Temp: 98.2 F (36.8 C) 98.4 F (36.9 C) 98.2 F (36.8 C) 97.6 F (36.4 C)  TempSrc: Oral Oral Oral Oral  SpO2: 99% 99% 100% 100%  Weight:      Height:        Intake/Output Summary (Last 24 hours) at 05/08/16 1746 Last data filed at 05/08/16 0435  Gross per 24 hour  Intake                0 ml  Output              300 ml  Net             -300 ml   Filed Weights   04/25/16 1110 05/02/16 0823  Weight: 45.4 kg (100 lb) 44.5 kg (98 lb 1.7 oz)    Examination:  General exam: pleasant, not oriented  Respiratory system: Clear to auscultation. Respiratory effort normal. Cardiovascular system: S1 & S2 heard, RRR. No JVD, murmurs, rubs, gallops or clicks. No pedal edema. Gastrointestinal system: Abdomen is nondistended, soft and nontender. No organomegaly or masses felt. Normal bowel sounds heard. Central nervous system: unable to assess. Extremities: unable to assess Skin: No rashes, lesions or ulcers Psychiatry: unable to assess    Data Reviewed: I have personally reviewed following labs and imaging studies  CBC:  Recent Labs Lab 05/02/16 0537  WBC 6.2  NEUTROABS 3.9  HGB 10.4*  HCT 31.8*  MCV 99.4  PLT 127   Basic Metabolic Panel:  Recent Labs Lab 05/02/16 0537  NA 133*  K 3.6  CL 103  CO2 24  GLUCOSE 125*  BUN 12  CREATININE 0.79  CALCIUM 8.1*   GFR: Estimated Creatinine Clearance: 52.5 mL/min (by C-G formula based on SCr of 0.79 mg/dL). Liver Function Tests:  Recent Labs Lab 05/02/16 0537  AST 28  ALT 20  ALKPHOS 45  BILITOT 0.2*  PROT 6.5  ALBUMIN 2.6*   No results for input(s): LIPASE, AMYLASE in the last 168 hours. No results for input(s): AMMONIA in the last 168 hours. Coagulation Profile: No results for input(s): INR, PROTIME in the last 168 hours. Cardiac Enzymes: No results for input(s): CKTOTAL, CKMB, CKMBINDEX, TROPONINI in the last 168 hours. BNP (last 3  results) No results for input(s): PROBNP in the last 8760 hours. HbA1C: No results for input(s): HGBA1C in the last 72 hours. CBG: No results for input(s): GLUCAP in the last 168 hours. Lipid Profile: No results for input(s): CHOL, HDL, LDLCALC, TRIG, CHOLHDL, LDLDIRECT in the last 72 hours. Thyroid Function Tests: No results for input(s): TSH, T4TOTAL, FREET4, T3FREE, THYROIDAB in the last 72 hours. Anemia Panel: No results for input(s): VITAMINB12, FOLATE, FERRITIN, TIBC, IRON, RETICCTPCT in the last 72 hours. Sepsis Labs: No results for input(s): PROCALCITON, LATICACIDVEN in the last 168 hours.  Recent Results (from the past 240 hour(s))  MRSA PCR Screening     Status: None   Collection Time: 05/02/16  8:33 AM  Result Value Ref Range Status   MRSA by PCR NEGATIVE NEGATIVE Final    Comment:        The GeneXpert MRSA Assay (FDA approved for NASAL specimens only),  is one component of a comprehensive MRSA colonization surveillance program. It is not intended to diagnose MRSA infection nor to guide or monitor treatment for MRSA infections.          Radiology Studies: No results found.      Scheduled Meds: . dexamethasone  2 mg Oral Q12H  . risperiDONE  0.25 mg Oral QHS  . sodium chloride flush  3 mL Intravenous Q12H   Continuous Infusions:   LOS: 6 days    Time spent: 35 minutes    Minahil Quinlivan, MD PhD [facility] Pager [phone]  If 7PM-7AM, please contact night-coverage [email or web address] Password [facility] 05/08/2016, 5:46 PM

## 2016-05-08 NOTE — Consult Note (Signed)
Telepsych Consultation   Reason for Consult:  Hallucinations  Referring Physician:   Patient Identification: Charles [name] MRN:  [id number] Principal Diagnosis: Delirium Diagnosis:   Patient Active Problem List   Diagnosis Date Noted  . Lesion of lung [R91.1] 05/02/2016  . Brain metastases ([place]) [C79.31] 05/02/2016  . Lung mass [R91.8] 05/02/2016  . Cavitating mass in right upper lung lobe [J98.4]   . Peripheral vascular disease, unspecified [I73.9] 11/11/2012  . Encephalopathy, metabolic [A19.37] 90/24/0973  . Acute post-hemorrhagic anemia [D62] 10/11/2012  . Delirium [R41.0] 10/04/2012  . Salmonella bacteremia [R78.81] 09/25/2012  . Acute respiratory failure ([place]) [J96.00] 09/22/2012  . Hyponatremia [E87.1] 09/21/2012  . Adult failure to thrive [R62.7] 09/21/2012  . Dehydration [E86.0] 09/21/2012  . Generalized weakness [R53.1] 09/21/2012  . AAA (abdominal aortic aneurysm, ruptured) ([place]) [I71.3] 09/21/2012  . New onset atrial fibrillation ([place]) [I48.91] 09/21/2012  . Alcohol abuse [F10.10] 09/21/2012    Total Time spent with patient: 15 minutes  Subjective:   Charles [name] is a 70 y.o. male patient admitted with delirium and hallucinations.  HPI:  Charles [name] is a 70 year old male who was admitted to the [facility] with delirium on 04/25/16. He has a long history of alcohol abuse which was thought to be the cause of his delirium and hallucinations. He was placed on [facility] bed hold while TTS was seeking gero psych placement. After spending a week in the [facility] the Pt continued hallucinating and was referred for a medical work up which revealed a cavitaiting right lung mass with necrosis and brain metastasis. Oncology and Pulmonolgy were consulted and Pt has been referred to Hospice care for palliation. MD at [facility] requested a tele psych consult so Pt can be psychologically cleared and discharged  to Hospice care.   Today during tele psych consult:  Charles [name] is a 70 year old  male who has right metastatic lung cancer. He was calm and cooperative, alert to self only, denies suicidal/homicidal ideations and denies auditory and visual hallucinations. Pt will need Hospice care for end of life and palliation per Oncology recommendation. Pt is psychologically clear to be discharged to Hospice. Pt was able to answer this writer's questions appropriately with the exception of where he is and what the date is. Pt stated he was at the hotel and today is 10/15/1945, which does happen to be his birthday.   Past Psychiatric History: Alcohol abuse  Risk to Self: Suicidal Ideation: No Suicidal Intent: No Is patient at risk for suicide?: No Suicidal Plan?: No Access to Means: No What has been your use of drugs/alcohol within the last 12 months?: Daily alcohol use How many times?: 0 Other Self Harm Risks: None Triggers for Past Attempts: None known Intentional Self Injurious Behavior: None Risk to Others: Homicidal Ideation: No Thoughts of Harm to Others: No Current Homicidal Intent: No Current Homicidal Plan: No Access to Homicidal Means: No History of harm to others?: No Assessment of Violence: None Noted Violent Behavior Description: Denied Does patient have access to weapons?: Yes (Comment) (Weapon at home) Criminal Charges Pending?: No Does patient have a court date: No Prior Inpatient Therapy: Prior Inpatient Therapy: Yes Prior Therapy Dates: Unknown Prior Therapy Facilty/Provider(s): Unknown Reason for Treatment: Substance abuse Prior Outpatient Therapy: Prior Outpatient Therapy: Yes Prior Therapy Dates: Unknown Prior Therapy Facilty/Provider(s): Unknown Reason for Treatment: Substance abuse Does patient have an ACCT team?: No Does patient have Intensive In-House Services?  : No Does patient have [facility] services? : No  Does patient have P4CC services?: No  Past Medical History:  Past Medical History:  Diagnosis Date  . AAA (abdominal aortic aneurysm) ([place])   .  Collapsed lung    s/p fall, rib fracture  . Hypercholesteremia   . Hypertension   . Salmonella bacteremia     Past Surgical History:  Procedure Laterality Date  . ABDOMINAL AORTIC ANEURYSM REPAIR N/A 09/21/2012   Procedure: ANEURYSM ABDOMINAL AORTIC REPAIR;  Surgeon: Elam Dutch, MD;  Location: [place];  Service: Vascular;  Laterality: N/A;  . EMBOLECTOMY Right 09/21/2012   Procedure: EMBOLECTOMY;  Surgeon: Elam Dutch, MD;  Location: [place];  Service: Vascular;  Laterality: Right;  Embolectomy of right leg.  Marland Kitchen FEMORAL-POPLITEAL BYPASS GRAFT Right 09/30/2012   Procedure: BYPASS GRAFT FEMORAL-POPLITEAL ARTERY;  Surgeon: Conrad Broadus, MD;  Location: [place];  Service: Vascular;  Laterality: Right;  . HERNIA REPAIR    . PATCH ANGIOPLASTY  09/21/2012   Procedure: PATCH ANGIOPLASTY;  Surgeon: Elam Dutch, MD;  Location: [facility] OR;  Service: Vascular;;  [place]em[place]ield Patch Angioplasty fo Right Common Femoral Artery  . PLEURAL SCARIFICATION     collapsed lung  . WOUND DEBRIDEMENT  09/24/2012   Procedure: Removal of Abdominal wound VAC, and Abdominal Closure;  Surgeon: Elam Dutch, MD;  Location: [facility] OR;  Service: Vascular;;   Family History:  Family History  Problem Relation Age of Onset  . Diabetes Mother   . Diabetes Father    Family Psychiatric  History: Unknown Social History:  History  Alcohol Use  . Yes    Comment: Daily - 1/2 pt liquor; 2 40 oz beers -- per hx     History  Drug Use No    Social History   Social History  . Marital status: Single    Spouse name: N/A  . Number of children: N/A  . Years of education: N/A   Social History Main Topics  . Smoking status: Current Every Day Smoker    Packs/day: 0.50    Types: Cigarettes  . Smokeless tobacco: Never Used  . Alcohol use Yes     Comment: Daily - 1/2 pt liquor; 2 40 oz beers -- per hx  . Drug use: No  . Sexual activity: No   Other Topics Concern  . None   Social History Narrative  . None   Additional  Social History:    Allergies:  No Known Allergies  Labs: No results found for this or any previous visit (from the past 48 hour(s)).  Current Facility-Administered Medications  Medication Dose Route Frequency Provider Last Rate Last Dose  . 0.9 %  sodium chloride infusion  250 mL Intravenous PRN Phillips Grout, MD      . dexamethasone (DECADRON) tablet 2 mg  2 mg Oral Q12H Sinda Du, MD   2 mg at 05/08/16 0936  . HYDROcodone-acetaminophen (NORCO/VICODIN) 5-325 MG per tablet 1 tablet  1 tablet Oral Q4H PRN Oswald Hillock, MD   1 tablet at 05/08/16 0436  . LORazepam (ATIVAN) injection 2 mg  2 mg Intravenous Q4H PRN Oswald Hillock, MD   2 mg at 05/08/16 0436  . ondansetron (ZOFRAN) tablet 4 mg  4 mg Oral Q6H PRN Phillips Grout, MD       Or  . ondansetron (ZOFRAN) injection 4 mg  4 mg Intravenous Q6H PRN Phillips Grout, MD      . sodium chloride flush (NS) 0.9 % injection 3 mL  3 mL Intravenous Q12H Phillips Grout, MD   3 mL at 05/07/16 2246  . sodium chloride flush (NS) 0.9 % injection 3 mL  3 mL Intravenous PRN Phillips Grout, MD        Musculoskeletal: Unable to assess: camera  Psychiatric Specialty Exam: Physical Exam  Review of Systems  Psychiatric/Behavioral: Positive for hallucinations (due to brain mets), memory loss (due to brain mets) and substance abuse. Negative for depression and suicidal ideas. The patient is not nervous/anxious and does not have insomnia.   All other systems reviewed and are negative.   Blood pressure 116/71, pulse 72, temperature 97.6 F (36.4 C), temperature source Oral, resp. rate 20, height '5\' 10"'$  (1.778 m), weight 44.5 kg (98 lb 1.7 oz), SpO2 100 %.Body mass index is 14.08 kg/m.  General Appearance: Disheveled  Eye Contact:  Minimal  Speech:  Garbled  Volume:  Decreased  Mood:  sleeping  Affect:  Blunt  Thought Process:  Disorganized  Orientation:  Other:  person only  Thought Content:  Illogical  Suicidal Thoughts:  No  Homicidal Thoughts:   No  Memory:  Immediate;   Poor Recent;   Poor Remote;   Poor  Judgement:  Impaired  Insight:  Lacking  Psychomotor Activity:  Decreased  Concentration:  Concentration: Good and Attention Span: Good  Recall:  Poor  Fund of Knowledge:  Fair  Language:  Good  Akathisia:  No  Handed:  Right  AIMS (if indicated):     Assets:  Agricultural consultant Housing Social Support  ADL's:  Impaired  Cognition:  WNL  Sleep:        Treatment Plan Summary: Pt will discharge with Hospice for end of life/palliation care due to right cavitating lung mass with necrosis and brain mets.    Disposition: No evidence of imminent risk to self or others at present.   Patient does not meet criteria for psychiatric inpatient admission.  Ethelene Hal, NP 05/08/2016 10:36 AM

## 2016-05-08 NOTE — Progress Notes (Signed)
Brief visit for emotional and spiritual support.

## 2016-05-09 ENCOUNTER — Inpatient Hospital Stay (HOSPITAL_COMMUNITY): Payer: Medicare Other

## 2016-05-09 ENCOUNTER — Encounter (HOSPITAL_COMMUNITY): Payer: Self-pay | Admitting: Pulmonary Disease

## 2016-05-09 DIAGNOSIS — Z515 Encounter for palliative care: Secondary | ICD-10-CM

## 2016-05-09 LAB — BASIC METABOLIC PANEL
Anion gap: 8 (ref 5–15)
BUN: 25 mg/dL — AB (ref 6–20)
CALCIUM: 9 mg/dL (ref 8.9–10.3)
CO2: 26 mmol/L (ref 22–32)
CREATININE: 0.85 mg/dL (ref 0.61–1.24)
Chloride: 104 mmol/L (ref 101–111)
GFR calc Af Amer: >60 mL/min (ref >60)
GLUCOSE: 124 mg/dL — AB (ref 65–99)
Potassium: 4.3 mmol/L (ref 3.5–5.1)
Sodium: 138 mmol/L (ref 135–145)

## 2016-05-09 LAB — PROTIME-INR
INR: 1.09
PROTHROMBIN TIME: 14.1 s (ref 11.4–15.2)

## 2016-05-09 LAB — CBC
HEMATOCRIT: 33.5 % — AB (ref 39.0–52.0)
Hemoglobin: 11 g/dL — ABNORMAL LOW (ref 13.0–17.0)
MCH: 32.4 pg (ref 26.0–34.0)
MCHC: 32.8 g/dL (ref 30.0–36.0)
MCV: 98.5 fL (ref 78.0–100.0)
PLATELETS: 201 10*3/uL (ref 150–400)
RBC: 3.4 MIL/uL — ABNORMAL LOW (ref 4.22–5.81)
RDW: 12.5 % (ref 11.5–15.5)
WBC: 7.3 10*3/uL (ref 4.0–10.5)

## 2016-05-09 LAB — APTT: aPTT: 28 seconds (ref 24–36)

## 2016-05-09 LAB — MAGNESIUM: Magnesium: 2.1 mg/dL (ref 1.7–2.4)

## 2016-05-09 MED ORDER — HALOPERIDOL LACTATE 5 MG/ML IJ SOLN
2.0000 mg | Freq: Four times a day (QID) | INTRAMUSCULAR | Status: DC | PRN
  Administered 2016-05-10 – 2016-05-13 (×7): 2 mg via INTRAVENOUS
  Filled 2016-05-09 (×8): qty 1

## 2016-05-09 MED ORDER — HALOPERIDOL LACTATE 5 MG/ML IJ SOLN
5.0000 mg | Freq: Once | INTRAMUSCULAR | Status: AC
  Administered 2016-05-09: 5 mg via INTRAVENOUS
  Filled 2016-05-09: qty 1

## 2016-05-09 MED ORDER — RISPERIDONE 0.5 MG PO TABS
0.2500 mg | ORAL_TABLET | Freq: Two times a day (BID) | ORAL | Status: DC
  Administered 2016-05-09: 0.25 mg via ORAL
  Filled 2016-05-09: qty 1

## 2016-05-09 MED ORDER — IOPAMIDOL (ISOVUE-300) INJECTION 61%
100.0000 mL | Freq: Once | INTRAVENOUS | Status: AC | PRN
  Administered 2016-05-09: 100 mL via INTRAVENOUS

## 2016-05-09 MED ORDER — IOPAMIDOL (ISOVUE-300) INJECTION 61%
INTRAVENOUS | Status: AC
  Filled 2016-05-09: qty 30

## 2016-05-09 MED ORDER — QUETIAPINE FUMARATE 25 MG PO TABS
50.0000 mg | ORAL_TABLET | Freq: Two times a day (BID) | ORAL | Status: DC
  Administered 2016-05-09 – 2016-05-14 (×10): 50 mg via ORAL
  Filled 2016-05-09 (×10): qty 2

## 2016-05-09 MED ORDER — QUETIAPINE FUMARATE 25 MG PO TABS
25.0000 mg | ORAL_TABLET | Freq: Two times a day (BID) | ORAL | Status: DC
  Administered 2016-05-09: 25 mg via ORAL
  Filled 2016-05-09: qty 1

## 2016-05-09 NOTE — Progress Notes (Addendum)
PROGRESS NOTE                                                                                                                                                                                                             Patient Demographics:    Charles [name], is a 70 y.o. male, DOB - [birth year], LZJ:[id number]  Admit date - 04/25/2016   Admitting Physician Phillips Grout, MD  Outpatient Primary MD for the patient is No PCP Per Patient  LOS - 7  Outpatient Specialists:none  Chief Complaint  Patient presents with  . V70.1       Brief Narrative   70 year old male with a history of? Dementia, alcohol abuse, hypertension, AAA brought to the ED by his daughter for delirium and hallucinations. Patient was in ED for almost a week awaiting placement. Patient had the symptoms almost 1 week prior to his hospitalization. Initially was thought due to alcohol withdrawal and started on CIWA however symptoms continued to persist. An incidental workup in the ED with CT chest showed concern for malignant lesion. A head CT with contrast was then done showing possible metastatic lesion. He was then admitted to hospitalist service for workup of possible malignancy and persistent altered mental status.     Subjective:   Patient was very aggressive during the night and reportedly got out of bed post to sitter and pulled the white port of the wall and started swinging it at the sitter. Security was called and given? Haldol after which he calmed down. On my exam today he was sleeping and denied any symptoms but started cursing staring at the wall, saying something lie "they are planning against me."   Assessment  & Plan :    Principal Problem:   Delirium With hallucinations -Unclear if his underlying ?dementia worsening or the cerebellar mass with mild surrounding edema contributing to it. He was also started on Decadron this hospitalization  which may be making him more psychotic. -Psychiatry consulted but they have been of no help and did not give any recommendations. -Given his acute psychosis and aggressive behavior he was started on Risperdal yesterday but has not helped much. -I have added him on Seroquel 50 mg twice a day along with when necessary IV Haldol. Use Ativan only if the other two fail. Monitor QTC.   Active Problems: Metastatic  brain lesion Millimeters enhancing metastatic right cerebellar mass with surrounding edema. Necrotic/cavitary right upper lobe lung mass on chest CT. Possible invasion into mediastinum. Also shows a masslike consolidation in the lingula suspicious for malignancy. Pulmonary consult appreciated. Underwent bronchoscopy cytology was negative for malignancy. Intervention radiology consulted to obtain biopsy of the lung mass or lesions in his neck. [facility] IR recommended CT of the abdomen and pelvis to see for any safer areas for biopsy. If not then they will plan for the lung biopsy.  Discussed with IR , they have ordered CT of the abdomen and pelvis which should be followed. Depending upon the result they plan on biopsy on Monday next week at [facility]. Plan is for transferring patient via [facility] in the morning and send him back after biopsy. Please make him nothing by mouth after midnight on 'Sunday.  -daughter wished to make patient hospice if biopsy was positive for malignancy but she is interested in definitive diagnosis. Palliative care following.   Alcohol abuse No signs of withdrawal.  History of AAA status post repair Stable.       Code Status : Full code  Family Communication  : called daughter Arithia and left a message  Disposition Plan  : Pending final diagnosis  Barriers For Discharge : Active psych symptoms, pending bx  Consults  :    pulmonary IR Palliative care  Procedures  :  CT head neck  MRI brain CT chest Bronchoscopy with biopsy CT  abdomen and pelvis  with contrast (ordered)   DVT Prophylaxis  :  Lovenox -   Lab Results  Component Value Date   PLT 201 05/09/2016    Antibiotics  :    Anti-infectives    None        Objective:   Vitals:   05/08/16 0639 05/08/16 2038 05/08/16 2225 05/09/16 0648  BP: 116/71  104/69 131/65  Pulse: 72  85 63  Resp: 20  18 20  Temp: 97.6 F (36.4 C)  97.8 F (36.6 C) 97.6 F (36.4 C)  TempSrc: Oral  Oral Oral  SpO2: 100% 97% 100% 100%  Weight:      Height:        Wt Readings from Last 3 Encounters:  05/02/16 44.5 kg (98 lb 1.7 oz)  04/23/16 45.4 kg (100 lb)  04/23/16 45.4 kg (100 lb)     Intake/Output Summary (Last 24 hours) at 05/09/16 1502 Last data filed at 05/09/16 0800  Gross per 24 hour  Intake              240 ml  Output              200 ml  Net               40'$  ml     Physical Exam  Gen: not in distress HEENT: moist mucosa, supple neck Chest: clear b/l, no added sounds CVS: N S1&S2, no murmurs,  GI: soft, NT, ND Musculoskeletal: warm, no edema CNS: Confused    Data Review:    CBC  Recent Labs Lab 05/09/16 0604  WBC 7.3  HGB 11.0*  HCT 33.5*  PLT 201  MCV 98.5  MCH 32.4  MCHC 32.8  RDW 12.5    Chemistries   Recent Labs Lab 05/09/16 0604  NA 138  K 4.3  CL 104  CO2 26  GLUCOSE 124*  BUN 25*  CREATININE 0.85  CALCIUM 9.0  MG 2.1   ------------------------------------------------------------------------------------------------------------------ No results  for input(s): CHOL, HDL, LDLCALC, TRIG, CHOLHDL, LDLDIRECT in the last 72 hours.  Lab Results  Component Value Date   HGBA1C 5.8 (H) 09/22/2012   ------------------------------------------------------------------------------------------------------------------ No results for input(s): TSH, T4TOTAL, T3FREE, THYROIDAB in the last 72 hours.  Invalid input(s): FREET3 ------------------------------------------------------------------------------------------------------------------ No  results for input(s): VITAMINB12, FOLATE, FERRITIN, TIBC, IRON, RETICCTPCT in the last 72 hours.  Coagulation profile  Recent Labs Lab 05/09/16 0920  INR 1.09    No results for input(s): DDIMER in the last 72 hours.  Cardiac Enzymes No results for input(s): CKMB, TROPONINI, MYOGLOBIN in the last 168 hours.  Invalid input(s): CK ------------------------------------------------------------------------------------------------------------------ No results found for: BNP  Inpatient Medications  Scheduled Meds: . dexamethasone  2 mg Oral Q12H  . iopamidol      . QUEtiapine  25 mg Oral BID  . sodium chloride flush  3 mL Intravenous Q12H   Continuous Infusions: PRN Meds:.sodium chloride, haloperidol lactate, HYDROcodone-acetaminophen, LORazepam, ondansetron **OR** ondansetron (ZOFRAN) IV, sodium chloride flush  Micro Results Recent Results (from the past 240 hour(s))  MRSA PCR Screening     Status: None   Collection Time: 05/02/16  8:33 AM  Result Value Ref Range Status   MRSA by PCR NEGATIVE NEGATIVE Final    Comment:        The GeneXpert MRSA Assay (FDA approved for NASAL specimens only), is one component of a comprehensive MRSA colonization surveillance program. It is not intended to diagnose MRSA infection nor to guide or monitor treatment for MRSA infections.     Radiology Reports Dg Chest 2 View  Result Date: 05/01/2016 CLINICAL DATA:  Cough and congestion. EXAM: CHEST  2 VIEW COMPARISON:  04/23/2016 FINDINGS: COPD with hyperinflation and emphysematous changes. There is a right paramediastinal opacity that appears new from 2014. No internal air bronchograms are noted. No edema, effusion, or pneumothorax. Normal heart size.  Aortic atherosclerosis. IMPRESSION: 1. Medial right apex opacity which is new from 2014. Recommend chest CT to differentiate mass from pneumonia. 2. COPD. Electronically Signed   By: Monte Fantasia M.D.   On: 05/01/2016 12:34   Dg Chest 2  View  Result Date: 04/23/2016 CLINICAL DATA:  Chest pain EXAM: CHEST  2 VIEW COMPARISON:  01/16/2016 FINDINGS: Cardiac shadow is within normal limits. Aortic calcifications are noted. The lungs are hyper aerated bilaterally without focal infiltrate or sizable effusion. Bilateral nipple shadows are noted. 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 Electronically Signed   By: Inez Catalina M.D.   On: 04/23/2016 12:40   Ct Head Wo Contrast  Result Date: 04/26/2016 CLINICAL DATA:  70 y/o  M; altered mental status. EXAM: CT HEAD WITHOUT CONTRAST TECHNIQUE: Contiguous axial images were obtained from the base of the skull through the vertex without intravenous contrast. COMPARISON:  None. FINDINGS: Brain: No evidence for large acute territory infarct, focal mass effect, or intracranial hemorrhage. Foci of hypoattenuation in subcortical and periventricular white matter are compatible with moderate chronic microvascular ischemic changes. Lucency in the right cerebellar hemisphere and bilateral lentiform nuclei probably represent chronic lacunar infarcts. Mild brain parenchymal volume loss. Normal ventricle size. No extra-axial collection. Vascular: No hyperdense vessel. Extensive calcific atherosclerosis of V4 segments and carotid siphons. Skull: Normal. Negative for fracture or focal lesion. Sinuses/Orbits: Opacification of right maxillary sinus with chronic inflammatory changes of the walls. Partial opacification of right anterior ethmoid air cells. Otherwise the visualized paranasal sinuses and mastoid air cells are normally aerated. Chronic right lamina papyracea fracture. Other: None. IMPRESSION: 1. No evidence for large  acute infarct, focal mass effect, or intracranial hemorrhage. 2. Background of moderate chronic microvascular ischemic changes and mild parenchymal volume loss. 3. Right maxillary chronic sinusitis. Electronically Signed   By: Kristine Garbe M.D.   On: 04/26/2016 02:40   Ct Head W &  Wo Contrast  Result Date: 05/02/2016 CLINICAL DATA:  Altered mental status. Recently diagnosed lung cancer. EXAM: CT HEAD WITHOUT AND WITH CONTRAST TECHNIQUE: Contiguous axial images were obtained from the base of the skull through the vertex without and with intravenous contrast CONTRAST:  57m ISOVUE-300 IOPAMIDOL (ISOVUE-300) INJECTION 61% COMPARISON:  Head CT 04/26/2016 FINDINGS: Brain: There is a focal contrast-enhancing lesion within the right cerebellum that measures 5 mm. No other enhancing lesions are identified. There is periventricular hypoattenuation compatible with chronic microvascular disease. No mass effect or midline shift. No hydrocephalus or extra-axial collection. No acute hemorrhage. Vascular: Atherosclerotic arterial calcifications of the skullbase. Skull: No skull fracture or focal calvarial lesion. Visualized skull base is normal. Sinuses/Orbits: There is complete opacification of the right maxillary sinus. Chronic right lamina papyracea fracture. Otherwise normal orbits. IMPRESSION: 1. Single enhancing metastatic lesion within the right cerebellar hemisphere, measuring 5 mm. MRI is recommended to evaluate the complete extent of intracranial metastatic disease. 2. No mass effect, hemorrhage or evidence of acute infarct. Electronically Signed   By: KUlyses JarredM.D.   On: 05/02/2016 05:01   Ct Chest W Contrast  Result Date: 05/02/2016 CLINICAL DATA:  Right upper lobe masses EXAM: CT CHEST WITH CONTRAST TECHNIQUE: Multidetector CT imaging of the chest was performed during intravenous contrast administration. CONTRAST:  745mISOVUE-300 IOPAMIDOL (ISOVUE-300) INJECTION 61% COMPARISON:  Chest radiograph 05/01/2016 FINDINGS: Cardiovascular: There is atherosclerotic calcification of the aortic arch and the proximal arch vessels. There is extensive coronary artery calcification. Heart size is within normal limits. No pericardial effusion. Mediastinum/Nodes: No mediastinal, hilar or axillary  lymphadenopathy. The visualized thyroid and thoracic esophageal course are unremarkable. Lungs/Pleura: There is a mass in the medial right lung apex with areas of cavitation and necrosis, measuring altogether 3.6 x 3.6 cm. This abuts the pleura in there is loss of the normal mediastinal fat surrounding the esophagus, suggesting a degree of invasion into the mediastinum. 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 There is a masslike area of consolidation within the anterior lingula that measures 2.4 x 1.9 cm. No pleural effusion. Additional smaller nodular opacity in the right upper lobe measures 10 x 7 mm. Upper Abdomen: No acute abnormality. Musculoskeletal: No chest wall abnormality. No acute or significant osseous findings. IMPRESSION: 1. Necrotic/cavitary right upper lobe mass with loss of adjacent mediastinal fat planes suggesting possible invasion into the mediastinum. The appearance is most suggestive of squamous cell carcinoma. A cavitary infectious process is considered less likely. 2. Suspected satellite nodule in the right upper lobe measuring up to 1 cm. 3. Masslike consolidation in the lingula is also suspicious for neoplasm. Follow-up imaging would be helpful to determine if this focus may be infectious or inflammatory. 4. Aortic and coronary artery atherosclerosis. Electronically Signed   By: KeUlyses Jarred.D.   On: 05/02/2016 02:26   Mr BrJeri CosoHFontrast  Result Date: 05/02/2016 CLINICAL DATA:  New diagnosis lung cancer.  Abnormal CT head. EXAM: MRI HEAD WITHOUT AND WITH CONTRAST TECHNIQUE: Multiplanar, multiecho pulse sequences of the brain and surrounding structures were obtained without and with intravenous contrast. CONTRAST:  39m139mULTIHANCE GADOBENATE DIMEGLUMINE 529 MG/ML IV SOLN COMPARISON:  CT head 05/02/2016 FINDINGS: Brain: 5  mm enhancing lesion in the right cerebellum with central necrosis as noted on CT. Mild surrounding edema. This is consistent with  metastatic disease. No other enhancing brain lesions identified. Image quality degraded by motion as well as artifact from hair braids. Mild atrophy. Chronic microvascular ischemic changes in the white matter and basal ganglia. No acute infarct. Negative for hemorrhage. Pituitary normal in size. Vascular: Normal arterial flow voids. Skull and upper cervical spine: Abnormal hypo intensity in the C2 and C3 vertebral bodies worrisome for bony metastatic disease. No lesions in the calvarium identified. Sinuses/Orbits: Complete opacification of the right maxillary sinus. Chronic fracture of the right medial orbit. No orbital mass lesion. Other: None IMPRESSION: 5 mm enhancing metastatic deposit right cerebellum with mild surrounding edema. No other enhancing brain lesions Abnormality of the C2 and C3 vertebral bodies, concerning for metastatic disease to bone. Recommend cervical spine MRI without with contrast for further evaluation. Electronically Signed   By: Franchot Gallo M.D.   On: 05/02/2016 08:20    Time Spent in minutes  25   Louellen Molder M.D on 05/09/2016 at 3:02 PM  Between 7am to 7pm - Pager - [phone]  After 7pm go to [email or web address] - password [facility]  [facility] -  Office  [phone]

## 2016-05-09 NOTE — Plan of Care (Signed)
Mr. Mckim is lying quietly in bed with his head under the covers. There is no family present today. He does have a sitter at bedside he states he has been relatively calm for the last hour.  Mr. Madlock does not move as I touch his leg. He is very frail and thin. His biopsy has been scheduled for (possibly) Monday, due to need for more radiographic imaging. PMT will reach out to responsible party (RP) Robyn Haber, who lives out of state, after biopsy results. Case manager states that daughter [name] has stated, in most circumstances, she would accept hospice for her father, but she desires to have a definitive diagnosis.

## 2016-05-09 NOTE — Progress Notes (Signed)
Aware of request for lung biopsy for this patient with a lung mass after a negative bronchoscopy.  This has been discussed with Dr. Earleen Newport as well as Dr. Luan Pulling.  We have requested a CT A/P secondary to the way the vessels appear around the left kidney.  This scan will hopefully help determine if there is another more safe place to biopsy rather than the lung.  If there is not, then we would likely move forward with a lung biopsy.  Due to the timing of today and repeat scan, this will not likely be done not matter what we biopsy until possibly Monday.  Dagon Budai E 11:39 AM 05/09/2016

## 2016-05-09 NOTE — Progress Notes (Signed)
His cytology is back and is negative for malignancy. I think this is simply that we did not get cells rather than that he does not have malignancy. He may be able to have biopsy of the lung mass or of the lesions in his neck and I have put in consultation for interventional radiology. Apparently psychiatry has released him but he is still having severe behavioral issues so I have started him on Risperdal. Clearly he needs treatment for his behavioral issues even if it's related to metastatic cancer

## 2016-05-09 NOTE — Progress Notes (Signed)
Approximately  2350 the patient got out of bed.  He pushed past the sitter.   He pulled the white board off the wall and started swinging it at the sitter.  Security was called and patient laid back down in the bed.  PRN Ativan given.  The patient continued to swing at staff and get out of the bed.  The on-call MD was notified.  New orders received and carried out.  Patient is currently resting in the bed.  Will continue to monitor.

## 2016-05-10 NOTE — Progress Notes (Signed)
PROGRESS NOTE  Charles [name] HAF:[id number] DOB: [birth year] DOA: 04/25/2016 PCP: No PCP Per Patient   LOS: 8 days   Brief Narrative: 70 year old male with a history of? Dementia, alcohol abuse, hypertension, AAA brought to the ED by his daughter for delirium and hallucinations. Patient was in ED for almost a week awaiting placement. Patient had the symptoms almost 1 week prior to his hospitalization. Initially was thought due to alcohol withdrawal and started on CIWA however symptoms continued to persist. An incidental workup in the ED with CT chest showed concern for malignant lesion. A head CT with contrast was then done showing possible metastatic lesion. He was then admitted to hospitalist service for workup of possible malignancy and persistent altered mental status.  Assessment & Plan: Principal Problem:   Delirium Active Problems:   Hyponatremia   AAA (abdominal aortic aneurysm, ruptured) (HCC)   Alcohol abuse   Lesion of lung   Brain metastases (HCC)   Lung mass   Palliative care encounter    Delirium With hallucinations - Unclear if his underlying ?dementia worsening or the cerebellar mass with mild surrounding edema contributing to it. - He was also started on Decadron this hospitalization which may be making him more psychotic. - Psychiatry consulted, did not give any recommendations. - Given his acute psychosis and aggressive behavior he was started on Risperdal but has not helped much.  - started on Seroquel 50 mg twice a day 12/8 along with when necessary IV Haldol. Use Ativan only if the other two fail. Monitor QTC.  Metastatic brain lesion - Millimeters enhancing metastatic right cerebellar mass with surrounding edema. Necrotic/cavitary right upper lobe lung mass on chest CT. Possible invasion into mediastinum. Also shows a masslike consolidation in the lingula suspicious for malignancy. - Pulmonary consult appreciated. Underwent bronchoscopy cytology was negative  for malignancy. - Intervention radiology consulted to obtain biopsy of the lung mass or lesions in his neck. Interventional radiology recommended a CT scan of the abdomen and pelvis to see whether there are other lesions that are more amenable and less risky to biopsy. CT scan without additional lesions that could be biopsied. Plan for IR biopsy on known lung mass on Monday. I made patient NPO after midnight Sunday evening - He will travel via [place] on Monday to [facility] for the biopsy. Suspect he will need general anesthesia given underlying delirium - daughter wished to make patient hospice if biopsy was positive for malignancy but she is interested in definitive diagnosis. Palliative care following.  Patient's daughter would like to be called by IR team on Monday for update.  Alcohol abuse - No signs of withdrawal.  History of AAA status post repair - Stable.    DVT prophylaxis: Lovenox Code Status: Full code Family Communication: d/w daughter Bernhardt Riemenschneider over the phone [phone]  Disposition Plan: TBD  Consultants:   Pulmonology   Psychiatry   Palliative care  IR  Procedures:   Bronchoscopy with biopsy  Antimicrobials:  None    Subjective: - Confused, complains of pain, unable to localize the pain but he tells me he's had pain all his life  Objective: Vitals:   05/08/16 2225 05/09/16 0648 05/09/16 2100 05/10/16 0645  BP: 104/69 131/65 128/62 128/69  Pulse: 85 63 64 67  Resp: '18 20 16 15  '$ Temp: 97.8 F (36.6 C) 97.6 F (36.4 C) 97.7 F (36.5 C) 97.1 F (36.2 C)  TempSrc: Oral Oral Oral Oral  SpO2: 100% 100% 100% 100%  Weight:  Height:        Intake/Output Summary (Last 24 hours) at 05/10/16 1324 Last data filed at 05/09/16 1832  Gross per 24 hour  Intake              240 ml  Output                0 ml  Net              240 ml   Filed Weights   04/25/16 1110 05/02/16 0823  Weight: 45.4 kg (100 lb) 44.5 kg (98 lb 1.7 oz)     Examination: Constitutional: NAD, minimally interactive with me, appears somewhat disheveled Vitals:   05/08/16 2225 05/09/16 0648 05/09/16 2100 05/10/16 0645  BP: 104/69 131/65 128/62 128/69  Pulse: 85 63 64 67  Resp: '18 20 16 15  '$ Temp: 97.8 F (36.6 C) 97.6 F (36.4 C) 97.7 F (36.5 C) 97.1 F (36.2 C)  TempSrc: Oral Oral Oral Oral  SpO2: 100% 100% 100% 100%  Weight:      Height:       Respiratory: clear to auscultation bilaterally, no wheezing, no crackles. Normal respiratory effort. Cardiovascular: Regular rate and rhythm, no murmurs / rubs / gallops.  Abdomen: no tenderness. Bowel sounds positive.  Skin: no rashes, lesions, ulcers. No induration Neurologic: non focal, moves all 4   Data Reviewed: I have personally reviewed following labs and imaging studies  CBC:  Recent Labs Lab 05/09/16 0604  WBC 7.3  HGB 11.0*  HCT 33.5*  MCV 98.5  PLT 505   Basic Metabolic Panel:  Recent Labs Lab 05/09/16 0604  NA 138  K 4.3  CL 104  CO2 26  GLUCOSE 124*  BUN 25*  CREATININE 0.85  CALCIUM 9.0  MG 2.1   GFR: Estimated Creatinine Clearance: 49.4 mL/min (by C-G formula based on SCr of 0.85 mg/dL). Liver Function Tests: No results for input(s): AST, ALT, ALKPHOS, BILITOT, PROT, ALBUMIN in the last 168 hours. No results for input(s): LIPASE, AMYLASE in the last 168 hours. No results for input(s): AMMONIA in the last 168 hours. Coagulation Profile:  Recent Labs Lab 05/09/16 0920  INR 1.09   Cardiac Enzymes: No results for input(s): CKTOTAL, CKMB, CKMBINDEX, TROPONINI in the last 168 hours. BNP (last 3 results) No results for input(s): PROBNP in the last 8760 hours. HbA1C: No results for input(s): HGBA1C in the last 72 hours. CBG: No results for input(s): GLUCAP in the last 168 hours. Lipid Profile: No results for input(s): CHOL, HDL, LDLCALC, TRIG, CHOLHDL, LDLDIRECT in the last 72 hours. Thyroid Function Tests: No results for input(s): TSH,  T4TOTAL, FREET4, T3FREE, THYROIDAB in the last 72 hours. Anemia Panel: No results for input(s): VITAMINB12, FOLATE, FERRITIN, TIBC, IRON, RETICCTPCT in the last 72 hours. Urine analysis:    Component Value Date/Time   COLORURINE YELLOW 04/23/2016 1817   APPEARANCEUR CLEAR 04/23/2016 1817   LABSPEC 1.020 04/23/2016 1817   PHURINE 5.5 04/23/2016 1817   GLUCOSEU NEGATIVE 04/23/2016 1817   HGBUR NEGATIVE 04/23/2016 1817   BILIRUBINUR SMALL (A) 04/23/2016 1817   KETONESUR 15 (A) 04/23/2016 1817   PROTEINUR NEGATIVE 04/23/2016 1817   UROBILINOGEN 0.2 10/11/2012 0020   NITRITE NEGATIVE 04/23/2016 1817   LEUKOCYTESUR NEGATIVE 04/23/2016 1817   Sepsis Labs: Invalid input(s): PROCALCITONIN, LACTICIDVEN  Recent Results (from the past 240 hour(s))  MRSA PCR Screening     Status: None   Collection Time: 05/02/16  8:33 AM  Result Value Ref Range  Status   MRSA by PCR NEGATIVE NEGATIVE Final    Comment:        The GeneXpert MRSA Assay (FDA approved for NASAL specimens only), is one component of a comprehensive MRSA colonization surveillance program. It is not intended to diagnose MRSA infection nor to guide or monitor treatment for MRSA infections.       Radiology Studies: Ct Abdomen Pelvis W Contrast  Result Date: 05/09/2016 CLINICAL DATA:  Altered mental status. Recent chest CT she was possible metastatic lesion. EXAM: CT ABDOMEN AND PELVIS WITH CONTRAST TECHNIQUE: Multidetector CT imaging of the abdomen and pelvis was performed using the standard protocol following bolus administration of intravenous contrast. CONTRAST:  151m ISOVUE-300 IOPAMIDOL (ISOVUE-300) INJECTION 61% COMPARISON:  Chest CT 05/02/2016 and abdominopelvic CT 09/21/2012 FINDINGS: Lower chest: Irregular 2.7 cm nodular opacity over the lingula unchanged from recent chest CT. Linear density over the posterior right base likely scarring. Subtle increased interstitial markings in the lung bases. Three vessel calcified  atherosclerotic coronary artery disease. Mild calcification in the mitral valve annulus. Calcified plaque over the descending thoracic aorta. Hepatobiliary: Within normal. Pancreas: Within normal. Spleen: Within normal. Adrenals/Urinary Tract: Left adrenal gland is difficult to visualize but likely within normal. Right adrenal gland is normal. Kidneys normal in size without hydronephrosis or nephrolithiasis. There are a few subcentimeter renal cortical hypodensities too small to characterize but likely cysts. Bladder is within normal. Ureters difficult to visualize. Stomach/Bowel: Stomach is within normal. Small bowel is unremarkable. Appendix is not visualized. There is mild fecal retention throughout the colon which is otherwise unremarkable. Vascular/Lymphatic: Moderate calcified plaque over the abdominal aorta as well as at the takeoff of the celiac axis, superior mesenteric artery and renal arteries. Evidence of repair patient's abdominal aortic aneurysm with patent aortoiliac graft. Mild low-density material in the periaortic region likely chronic. 2 cm oval low-density focus anterior to the left renal artery likely chronic change although cannot exclude a necrotic lymph node. No significant adenopathy. Reproductive: Prostate gland not well visualized. Musculoskeletal: Degenerative change of the spine with disc disease at the L4-5 level. Mild degenerate change of the hips. Mild curvature of the lumbar spine convex left. IMPRESSION: Evidence of patient's known 2.7 cm irregular nodule over the lingula likely neoplastic. Minimal right basilar scarring. Mild interstitial change in the lung bases. No definite metastatic disease in the abdomen/pelvis. Evidence of previous aortic aneurysm repair with aortoiliac stent graft which is patent. Low-density material in the periaortic region likely chronic. Focal 2 cm low-density structure anterior to the left renal artery likely chronic and much less likely a necrotic  lymph node. Recommend attention on follow-up. Few small subcentimeter renal cortical hypodensities too small to characterize but likely cysts. Three vessel atherosclerotic coronary artery disease. Aortic atherosclerosis. Electronically Signed   By: DMarin OlpM.D.   On: 05/09/2016 20:58     Scheduled Meds: . dexamethasone  2 mg Oral Q12H  . QUEtiapine  50 mg Oral BID  . sodium chloride flush  3 mL Intravenous Q12H   Continuous Infusions:   CMarzetta Board MD, PhD [facility] Pager [phone] If 7PM-7AM, please contact night-coverage [email or web address] Password [email or web address] 05/10/2016, 1:24 PM

## 2016-05-10 NOTE — Progress Notes (Signed)
I discussed his situation with Dr. Earleen Newport from interventional radiology yesterday. He requested CT abdomen and pelvis to see if there was any other lesions that might be safer to biopsy. CT abdomen and pelvis was done but does not show any other lesions that appear that they could be biopsied. He is now being treated with Risperdal. Await biopsy probably on Monday his daughter does request definitive diagnosis if possible

## 2016-05-11 NOTE — Progress Notes (Signed)
Discussed with his daughter by telephone yesterday. She had questions about whether his abnormalities could be related to tuberculosis. I think that's unlikely but I did order TB Quantiferon. If he has another biopsy done I think it's reasonable to do AFB smear on that.

## 2016-05-11 NOTE — Progress Notes (Signed)
PROGRESS NOTE  Charles [name] IAX:[id number] DOB: 1946-01-20 DOA: 04/25/2016 PCP: No PCP Per Patient   LOS: 9 days   Brief Narrative: 70 year old male with a history of? Dementia, alcohol abuse, hypertension, AAA brought to the ED by his daughter for delirium and hallucinations. Patient was in ED for almost a week awaiting placement. Patient had the symptoms almost 1 week prior to his hospitalization. Initially was thought due to alcohol withdrawal and started on CIWA however symptoms continued to persist. An incidental workup in the ED with CT chest showed concern for malignant lesion. A head CT with contrast was then done showing possible metastatic lesion. He was then admitted to hospitalist service for workup of possible malignancy and persistent altered mental status.  Assessment & Plan: Principal Problem:   Delirium Active Problems:   Hyponatremia   AAA (abdominal aortic aneurysm, ruptured) (HCC)   Alcohol abuse   Lesion of lung   Brain metastases (HCC)   Lung mass   Palliative care encounter   Delirium With hallucinations - Unclear if his underlying ?dementia worsening or the cerebellar mass with mild surrounding edema contributing to it. - He was also started on Decadron this hospitalization which may be making him more psychotic. - Psychiatry consulted, did not give any recommendations. - Given his acute psychosis and aggressive behavior he was started on Risperdal but has not helped much.  - started on Seroquel 50 mg twice a day 12/8 along with when necessary IV Haldol. Use Ativan only if the other two fail. Monitor QTC - He appears calm this morning, eating breakfast  Metastatic brain lesion - Millimeters enhancing metastatic right cerebellar mass with surrounding edema. Necrotic/cavitary right upper lobe lung mass on chest CT. Possible invasion into mediastinum. Also shows a masslike consolidation in the lingula suspicious for malignancy. - Pulmonary consult  appreciated. Underwent bronchoscopy cytology was negative for malignancy. - Intervention radiology consulted to obtain biopsy of the lung mass or lesions in his neck. Interventional radiology recommended a CT scan of the abdomen and pelvis to see whether there are other lesions that are more amenable and less risky to biopsy. CT scan without additional lesions that could be biopsied. Plan for IR biopsy on known lung mass on Monday. I made patient NPO after midnight Sunday evening - He will travel via [place] on Monday to [facility] for the biopsy. Suspect he will need general anesthesia given underlying delirium - daughter wished to make patient hospice if biopsy was positive for malignancy but she is interested in definitive diagnosis. Palliative care following.  Patient's daughter would like to be called by IR team on Monday for update.  Alcohol abuse - No signs of withdrawal.  History of AAA status post repair - Stable.    DVT prophylaxis: Lovenox Code Status: Full code Family Communication: d/w daughter Georgio Hattabaugh over the phone [phone] on 12/09 Disposition Plan: TBD  Consultants:   Pulmonology   Psychiatry   Palliative care  IR  Procedures:   Bronchoscopy with biopsy  Antimicrobials:  None    Subjective: - confused, eating breakfast, complains of generalized pain  Objective: Vitals:   05/09/16 2100 05/10/16 0645 05/11/16 0354 05/11/16 0750  BP: 128/62 128/69 (!) 143/94 113/65  Pulse: 64 67 71 65  Resp: '16 15 18 16  '$ Temp: 97.7 F (36.5 C) 97.1 F (36.2 C) 98.5 F (36.9 C) 98.7 F (37.1 C)  TempSrc: Oral Oral Oral Oral  SpO2: 100% 100% 100% 100%  Weight:  Height:        Intake/Output Summary (Last 24 hours) at 05/11/16 1035 Last data filed at 05/11/16 0823  Gross per 24 hour  Intake              600 ml  Output              200 ml  Net              400 ml   Filed Weights   04/25/16 1110 05/02/16 0823  Weight: 45.4 kg (100 lb) 44.5  kg (98 lb 1.7 oz)    Examination: Constitutional: NAD, minimally interactive with me, appears somewhat disheveled Vitals:   05/09/16 2100 05/10/16 0645 05/11/16 0354 05/11/16 0750  BP: 128/62 128/69 (!) 143/94 113/65  Pulse: 64 67 71 65  Resp: '16 15 18 16  '$ Temp: 97.7 F (36.5 C) 97.1 F (36.2 C) 98.5 F (36.9 C) 98.7 F (37.1 C)  TempSrc: Oral Oral Oral Oral  SpO2: 100% 100% 100% 100%  Weight:      Height:       Respiratory: clear to auscultation bilaterally, no wheezing, no crackles. Normal respiratory effort. Cardiovascular: Regular rate and rhythm, no murmurs / rubs / gallops.  Abdomen: no tenderness. Bowel sounds positive.  Skin: no rashes, lesions, ulcers. No induration Neurologic: non focal, moves all 4   Data Reviewed: I have personally reviewed following labs and imaging studies  CBC:  Recent Labs Lab 05/09/16 0604  WBC 7.3  HGB 11.0*  HCT 33.5*  MCV 98.5  PLT 412   Basic Metabolic Panel:  Recent Labs Lab 05/09/16 0604  NA 138  K 4.3  CL 104  CO2 26  GLUCOSE 124*  BUN 25*  CREATININE 0.85  CALCIUM 9.0  MG 2.1   GFR: Estimated Creatinine Clearance: 49.4 mL/min (by C-G formula based on SCr of 0.85 mg/dL). Liver Function Tests: No results for input(s): AST, ALT, ALKPHOS, BILITOT, PROT, ALBUMIN in the last 168 hours. No results for input(s): LIPASE, AMYLASE in the last 168 hours. No results for input(s): AMMONIA in the last 168 hours. Coagulation Profile:  Recent Labs Lab 05/09/16 0920  INR 1.09   Cardiac Enzymes: No results for input(s): CKTOTAL, CKMB, CKMBINDEX, TROPONINI in the last 168 hours. BNP (last 3 results) No results for input(s): PROBNP in the last 8760 hours. HbA1C: No results for input(s): HGBA1C in the last 72 hours. CBG: No results for input(s): GLUCAP in the last 168 hours. Lipid Profile: No results for input(s): CHOL, HDL, LDLCALC, TRIG, CHOLHDL, LDLDIRECT in the last 72 hours. Thyroid Function Tests: No results for  input(s): TSH, T4TOTAL, FREET4, T3FREE, THYROIDAB in the last 72 hours. Anemia Panel: No results for input(s): VITAMINB12, FOLATE, FERRITIN, TIBC, IRON, RETICCTPCT in the last 72 hours. Urine analysis:    Component Value Date/Time   COLORURINE YELLOW 04/23/2016 1817   APPEARANCEUR CLEAR 04/23/2016 1817   LABSPEC 1.020 04/23/2016 1817   PHURINE 5.5 04/23/2016 1817   GLUCOSEU NEGATIVE 04/23/2016 1817   HGBUR NEGATIVE 04/23/2016 1817   BILIRUBINUR SMALL (A) 04/23/2016 1817   KETONESUR 15 (A) 04/23/2016 1817   PROTEINUR NEGATIVE 04/23/2016 1817   UROBILINOGEN 0.2 10/11/2012 0020   NITRITE NEGATIVE 04/23/2016 1817   LEUKOCYTESUR NEGATIVE 04/23/2016 1817   Sepsis Labs: Invalid input(s): PROCALCITONIN, LACTICIDVEN  Recent Results (from the past 240 hour(s))  MRSA PCR Screening     Status: None   Collection Time: 05/02/16  8:33 AM  Result Value Ref Range Status  MRSA by PCR NEGATIVE NEGATIVE Final    Comment:        The GeneXpert MRSA Assay (FDA approved for NASAL specimens only), is one component of a comprehensive MRSA colonization surveillance program. It is not intended to diagnose MRSA infection nor to guide or monitor treatment for MRSA infections.       Radiology Studies: Ct Abdomen Pelvis W Contrast  Result Date: 05/09/2016 CLINICAL DATA:  Altered mental status. Recent chest CT she was possible metastatic lesion. EXAM: CT ABDOMEN AND PELVIS WITH CONTRAST TECHNIQUE: Multidetector CT imaging of the abdomen and pelvis was performed using the standard protocol following bolus administration of intravenous contrast. CONTRAST:  19m ISOVUE-300 IOPAMIDOL (ISOVUE-300) INJECTION 61% COMPARISON:  Chest CT 05/02/2016 and abdominopelvic CT 09/21/2012 FINDINGS: Lower chest: Irregular 2.7 cm nodular opacity over the lingula unchanged from recent chest CT. Linear density over the posterior right base likely scarring. Subtle increased interstitial markings in the lung bases. Three  vessel calcified atherosclerotic coronary artery disease. Mild calcification in the mitral valve annulus. Calcified plaque over the descending thoracic aorta. Hepatobiliary: Within normal. Pancreas: Within normal. Spleen: Within normal. Adrenals/Urinary Tract: Left adrenal gland is difficult to visualize but likely within normal. Right adrenal gland is normal. Kidneys normal in size without hydronephrosis or nephrolithiasis. There are a few subcentimeter renal cortical hypodensities too small to characterize but likely cysts. Bladder is within normal. Ureters difficult to visualize. Stomach/Bowel: Stomach is within normal. Small bowel is unremarkable. Appendix is not visualized. There is mild fecal retention throughout the colon which is otherwise unremarkable. Vascular/Lymphatic: Moderate calcified plaque over the abdominal aorta as well as at the takeoff of the celiac axis, superior mesenteric artery and renal arteries. Evidence of repair patient's abdominal aortic aneurysm with patent aortoiliac graft. Mild low-density material in the periaortic region likely chronic. 2 cm oval low-density focus anterior to the left renal artery likely chronic change although cannot exclude a necrotic lymph node. No significant adenopathy. Reproductive: Prostate gland not well visualized. Musculoskeletal: Degenerative change of the spine with disc disease at the L4-5 level. Mild degenerate change of the hips. Mild curvature of the lumbar spine convex left. IMPRESSION: Evidence of patient's known 2.7 cm irregular nodule over the lingula likely neoplastic. Minimal right basilar scarring. Mild interstitial change in the lung bases. No definite metastatic disease in the abdomen/pelvis. Evidence of previous aortic aneurysm repair with aortoiliac stent graft which is patent. Low-density material in the periaortic region likely chronic. Focal 2 cm low-density structure anterior to the left renal artery likely chronic and much less  likely a necrotic lymph node. Recommend attention on follow-up. Few small subcentimeter renal cortical hypodensities too small to characterize but likely cysts. Three vessel atherosclerotic coronary artery disease. Aortic atherosclerosis. Electronically Signed   By: DMarin OlpM.D.   On: 05/09/2016 20:58     Scheduled Meds: . dexamethasone  2 mg Oral Q12H  . QUEtiapine  50 mg Oral BID  . sodium chloride flush  3 mL Intravenous Q12H   Continuous Infusions:   CMarzetta Board MD, PhD [facility] Pager [phone] If 7PM-7AM, please contact night-coverage [email or web address] Password [email or web address] 05/11/2016, 10:35 AM

## 2016-05-12 ENCOUNTER — Encounter (HOSPITAL_COMMUNITY): Payer: Self-pay | Admitting: Primary Care

## 2016-05-12 ENCOUNTER — Other Ambulatory Visit: Payer: Self-pay | Admitting: Student

## 2016-05-12 DIAGNOSIS — Z7189 Other specified counseling: Secondary | ICD-10-CM

## 2016-05-12 LAB — QUANTIFERON IN TUBE
QUANTIFERON MITOGEN VALUE: 0.71 IU/mL
QUANTIFERON NIL VALUE: 0.14 [IU]/mL
QUANTIFERON TB AG VALUE: 0.11 [IU]/mL
QUANTIFERON TB GOLD: NEGATIVE

## 2016-05-12 LAB — CBC
HCT: 34.4 % — ABNORMAL LOW (ref 39.0–52.0)
HEMOGLOBIN: 11.2 g/dL — AB (ref 13.0–17.0)
MCH: 32 pg (ref 26.0–34.0)
MCHC: 32.6 g/dL (ref 30.0–36.0)
MCV: 98.3 fL (ref 78.0–100.0)
PLATELETS: 185 10*3/uL (ref 150–400)
RBC: 3.5 MIL/uL — AB (ref 4.22–5.81)
RDW: 12.8 % (ref 11.5–15.5)
WBC: 7.9 10*3/uL (ref 4.0–10.5)

## 2016-05-12 LAB — BASIC METABOLIC PANEL
Anion gap: 7 (ref 5–15)
BUN: 27 mg/dL — AB (ref 6–20)
CALCIUM: 8.5 mg/dL — AB (ref 8.9–10.3)
CHLORIDE: 101 mmol/L (ref 101–111)
CO2: 25 mmol/L (ref 22–32)
CREATININE: 0.72 mg/dL (ref 0.61–1.24)
GFR calc Af Amer: >60 mL/min (ref >60)
GFR calc non Af Amer: >60 mL/min (ref >60)
Glucose, Bld: 110 mg/dL — ABNORMAL HIGH (ref 65–99)
Potassium: 4.1 mmol/L (ref 3.5–5.1)
SODIUM: 133 mmol/L — AB (ref 135–145)

## 2016-05-12 LAB — PROTIME-INR
INR: 1.04
Prothrombin Time: 13.6 seconds (ref 11.4–15.2)

## 2016-05-12 LAB — QUANTIFERON TB GOLD ASSAY (BLOOD)

## 2016-05-12 MED ORDER — SIMETHICONE 80 MG PO CHEW: 160.0000 mg | CHEWABLE_TABLET | Freq: Four times a day (QID) | ORAL | Status: DC | PRN

## 2016-05-12 MED ORDER — GI COCKTAIL ~~LOC~~
30.0000 mL | Freq: Once | ORAL | Status: AC
  Administered 2016-05-12: 30 mL via ORAL
  Filled 2016-05-12: qty 30

## 2016-05-12 MED ORDER — SODIUM CHLORIDE 0.9 % IV SOLN: INTRAVENOUS | Status: DC

## 2016-05-12 NOTE — Progress Notes (Signed)
PROGRESS NOTE                                                                                                                                                                                                             Patient Demographics:    Charles [name], is a 70 y.o. male, DOB - 01-01-1946, FBP:[id number]  Admit date - 04/25/2016   Admitting Physician Phillips Grout, MD  Outpatient Primary MD for the patient is No PCP Per Patient  LOS - 68    Chief Complaint  Patient presents with  . V70.1       Brief Narrative 70 year old male with a history of? Dementia, alcohol abuse, hypertension, AAA brought to the ED by his daughter for delirium and hallucinations. Patient was in ED for almost a week awaiting placement. Patient had the symptoms almost 1 week prior to his hospitalization. Initially was thought due to alcohol withdrawal and started on CIWA however symptoms continued to persist. An incidental workup in the ED with CT chest showed concern for malignant lesion. A head CT with contrast was then done showing possible metastatic lesion. He was then admitted to hospitalist service for workup of possible malignancy and persistent altered mental status   Subjective:   Patient reportedly has not been agitated since he was started on Seroquel. Lung biopsy planned for tomorrow.   Assessment  & Plan :   Delirium With hallucinations - Unclear if his underlying ?dementia worsening or the cerebellar mass with mild surrounding edema contributing to it. - He was also started on Decadron this hospitalization which may be making him more psychotic. - Psychiatry consulted, did not give any recommendations. - Started on Seroquel 50 mg twice a day on 12/8 with good response. Has not been agitated since then. Continue when necessary Haldol.   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 Necrotic/cavitary right upper lobe lung mass on chest CT. Possible invasion into mediastinum. Also shows a masslike consolidation in the lingula suspicious for malignancy. - Pulmonary consult appreciated. Underwent bronchoscopy cytology was negative for malignancy. -CT abdomen and pelvis done without any mass lesion. Plan for lung biopsy tomorrow morning at [facility]. He will be transferred back here after that. -Pulmonary also sent quadrant test to rule out TB which is pending. - daughter wished to  make patient hospice if biopsy was positive for malignancy but she is interested in definitive diagnosis. Palliative care following.  -I had a long discussion with patient's daughter on the phone today. She is worried that patient may be discharged to SNF without definite diagnosis and proper hand over or communication. I assured her that if patient was accepted to SNF after biopsy tomorrow it would be safe for him to be discharged and that the providers at the facility would follow-up on the biopsy results and discuss with her regarding plan of care. If it definitely looked malignant they would pursue palliative care or hospice after discussing with her. Daughter agrees with the plan but wants her to be notified for any questions regarding medical decision-making..  Alcohol abuse - No signs of withdrawal.  History of AAA status post repair - Stable.       Code Status : Full Code  Family Communication  : Discussed with daughter on the phone  Disposition Plan  : Awaiting SNF after lung biopsy tomorrow  Barriers For Discharge : Lung biopsy and placement  Consults  :   Pulmonary IR Palliative care  Procedures  :  CT chest MRI brain Bronchoscopy with biopsy Pending lung biopsy  DVT Prophylaxis  :  Lovenox   Lab Results  Component Value Date   PLT 185 05/12/2016    Antibiotics  :    Anti-infectives    None        Objective:   Vitals:   05/11/16 1500 05/11/16  2022 05/12/16 0600 05/12/16 1436  BP: 120/63 131/82 (!) 112/59 131/78  Pulse: 69 65 72 71  Resp: '16 18  18  '$ Temp: 98.2 F (36.8 C) 97.9 F (36.6 C)  98.6 F (37 C)  TempSrc: Oral Oral  Oral  SpO2: 100% 100% 100% 100%  Weight:      Height:        Wt Readings from Last 3 Encounters:  05/02/16 44.5 kg (98 lb 1.7 oz)  04/23/16 45.4 kg (100 lb)  04/23/16 45.4 kg (100 lb)     Intake/Output Summary (Last 24 hours) at 05/12/16 1708 Last data filed at 05/12/16 1344  Gross per 24 hour  Intake              243 ml  Output              350 ml  Net             -107 ml     Physical Exam  Gen: not in distress,Confused HEENT:  moist mucosa, supple neck Chest: clear b/l, no added sounds CVS: N S1&S2, no murmurs,  GI: soft, NT, ND,  Musculoskeletal: warm, no edema     Data Review:    CBC  Recent Labs Lab 05/09/16 0604 05/12/16 0502  WBC 7.3 7.9  HGB 11.0* 11.2*  HCT 33.5* 34.4*  PLT 201 185  MCV 98.5 98.3  MCH 32.4 32.0  MCHC 32.8 32.6  RDW 12.5 12.8    Chemistries   Recent Labs Lab 05/09/16 0604 05/12/16 0502  NA 138 133*  K 4.3 4.1  CL 104 101  CO2 26 25  GLUCOSE 124* 110*  BUN 25* 27*  CREATININE 0.85 0.72  CALCIUM 9.0 8.5*  MG 2.1  --    ------------------------------------------------------------------------------------------------------------------ No results for input(s): CHOL, HDL, LDLCALC, TRIG, CHOLHDL, LDLDIRECT in the last 72 hours.  Lab Results  Component Value Date   HGBA1C 5.8 (H) 09/22/2012   ------------------------------------------------------------------------------------------------------------------  No results for input(s): TSH, T4TOTAL, T3FREE, THYROIDAB in the last 72 hours.  Invalid input(s): FREET3 ------------------------------------------------------------------------------------------------------------------ No results for input(s): VITAMINB12, FOLATE, FERRITIN, TIBC, IRON, RETICCTPCT in the last 72  hours.  Coagulation profile  Recent Labs Lab 05/09/16 0920 05/12/16 0502  INR 1.09 1.04    No results for input(s): DDIMER in the last 72 hours.  Cardiac Enzymes No results for input(s): CKMB, TROPONINI, MYOGLOBIN in the last 168 hours.  Invalid input(s): CK ------------------------------------------------------------------------------------------------------------------ No results found for: BNP  Inpatient Medications  Scheduled Meds: . dexamethasone  2 mg Oral Q12H  . QUEtiapine  50 mg Oral BID  . sodium chloride flush  3 mL Intravenous Q12H   Continuous Infusions: . [START ON 05/13/2016] sodium chloride     PRN Meds:.sodium chloride, haloperidol lactate, HYDROcodone-acetaminophen, LORazepam, ondansetron **OR** ondansetron (ZOFRAN) IV, sodium chloride flush  Micro Results No results found for this or any previous visit (from the past 240 hour(s)).  Radiology Reports Dg Chest 2 View  Result Date: 05/01/2016 CLINICAL DATA:  Cough and congestion. EXAM: CHEST  2 VIEW COMPARISON:  04/23/2016 FINDINGS: COPD with hyperinflation and emphysematous changes. There is a right paramediastinal opacity that appears new from 2014. No internal air bronchograms are noted. No edema, effusion, or pneumothorax. Normal heart size.  Aortic atherosclerosis. IMPRESSION: 1. Medial right apex opacity which is new from 2014. Recommend chest CT to differentiate mass from pneumonia. 2. COPD. Electronically Signed   By: Monte Fantasia M.D.   On: 05/01/2016 12:34   Dg Chest 2 View  Result Date: 04/23/2016 CLINICAL DATA:  Chest pain EXAM: CHEST  2 VIEW COMPARISON:  01/16/2016 FINDINGS: Cardiac shadow is within normal limits. Aortic calcifications are noted. The lungs are hyper aerated bilaterally without focal infiltrate or sizable effusion. Bilateral nipple shadows are noted. 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 Electronically Signed   By: Inez Catalina M.D.   On: 04/23/2016 12:40   Ct Head Wo  Contrast  Result Date: 04/26/2016 CLINICAL DATA:  70 y/o  M; altered mental status. EXAM: CT HEAD WITHOUT CONTRAST TECHNIQUE: Contiguous axial images were obtained from the base of the skull through the vertex without intravenous contrast. COMPARISON:  None. FINDINGS: Brain: No evidence for large acute territory infarct, focal mass effect, or intracranial hemorrhage. Foci of hypoattenuation in subcortical and periventricular white matter are compatible with moderate chronic microvascular ischemic changes. Lucency in the right cerebellar hemisphere and bilateral lentiform nuclei probably represent chronic lacunar infarcts. Mild brain parenchymal volume loss. Normal ventricle size. No extra-axial collection. Vascular: No hyperdense vessel. Extensive calcific atherosclerosis of V4 segments and carotid siphons. Skull: Normal. Negative for fracture or focal lesion. Sinuses/Orbits: Opacification of right maxillary sinus with chronic inflammatory changes of the walls. Partial opacification of right anterior ethmoid air cells. Otherwise the visualized paranasal sinuses and mastoid air cells are normally aerated. Chronic right lamina papyracea fracture. Other: None. IMPRESSION: 1. No evidence for large acute infarct, focal mass effect, or intracranial hemorrhage. 2. Background of moderate chronic microvascular ischemic changes and mild parenchymal volume loss. 3. Right maxillary chronic sinusitis. Electronically Signed   By: Kristine Garbe M.D.   On: 04/26/2016 02:40   Ct Head W & Wo Contrast  Result Date: 05/02/2016 CLINICAL DATA:  Altered mental status. Recently diagnosed lung cancer. EXAM: CT HEAD WITHOUT AND WITH CONTRAST TECHNIQUE: Contiguous axial images were obtained from the base of the skull through the vertex without and with intravenous contrast CONTRAST:  8m ISOVUE-300 IOPAMIDOL (ISOVUE-300) INJECTION 61% COMPARISON:  Head CT 04/26/2016 FINDINGS: Brain: There is a focal contrast-enhancing  lesion within the right cerebellum that measures 5 mm. No other enhancing lesions are identified. There is periventricular hypoattenuation compatible with chronic microvascular disease. No mass effect or midline shift. No hydrocephalus or extra-axial collection. No acute hemorrhage. Vascular: Atherosclerotic arterial calcifications of the skullbase. Skull: No skull fracture or focal calvarial lesion. Visualized skull base is normal. Sinuses/Orbits: There is complete opacification of the right maxillary sinus. Chronic right lamina papyracea fracture. Otherwise normal orbits. IMPRESSION: 1. Single enhancing metastatic lesion within the right cerebellar hemisphere, measuring 5 mm. MRI is recommended to evaluate the complete extent of intracranial metastatic disease. 2. No mass effect, hemorrhage or evidence of acute infarct. Electronically Signed   By: Ulyses Jarred M.D.   On: 05/02/2016 05:01   Ct Chest W Contrast  Result Date: 05/02/2016 CLINICAL DATA:  Right upper lobe masses EXAM: CT CHEST WITH CONTRAST TECHNIQUE: Multidetector CT imaging of the chest was performed during intravenous contrast administration. CONTRAST:  37m ISOVUE-300 IOPAMIDOL (ISOVUE-300) INJECTION 61% COMPARISON:  Chest radiograph 05/01/2016 FINDINGS: Cardiovascular: There is atherosclerotic calcification of the aortic arch and the proximal arch vessels. There is extensive coronary artery calcification. Heart size is within normal limits. No pericardial effusion. Mediastinum/Nodes: No mediastinal, hilar or axillary lymphadenopathy. The visualized thyroid and thoracic esophageal course are unremarkable. Lungs/Pleura: There is a mass in the medial right lung apex with areas of cavitation and necrosis, measuring altogether 3.6 x 3.6 cm. This abuts the pleura in there is loss of the normal mediastinal fat surrounding the esophagus, suggesting a degree of invasion into the mediastinum. 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 There is a masslike area of consolidation within the anterior lingula that measures 2.4 x 1.9 cm. No pleural effusion. Additional smaller nodular opacity in the right upper lobe measures 10 x 7 mm. Upper Abdomen: No acute abnormality. Musculoskeletal: No chest wall abnormality. No acute or significant osseous findings. IMPRESSION: 1. Necrotic/cavitary right upper lobe mass with loss of adjacent mediastinal fat planes suggesting possible invasion into the mediastinum. The appearance is most suggestive of squamous cell carcinoma. A cavitary infectious process is considered less likely. 2. Suspected satellite nodule in the right upper lobe measuring up to 1 cm. 3. Masslike consolidation in the lingula is also suspicious for neoplasm. Follow-up imaging would be helpful to determine if this focus may be infectious or inflammatory. 4. Aortic and coronary artery atherosclerosis. Electronically Signed   By: KUlyses JarredM.D.   On: 05/02/2016 02:26   Mr BJeri CosWJMContrast  Result Date: 05/02/2016 CLINICAL DATA:  New diagnosis lung cancer.  Abnormal CT head. EXAM: MRI HEAD WITHOUT AND WITH CONTRAST TECHNIQUE: Multiplanar, multiecho pulse sequences of the brain and surrounding structures were obtained without and with intravenous contrast. CONTRAST:  927mMULTIHANCE GADOBENATE DIMEGLUMINE 529 MG/ML IV SOLN COMPARISON:  CT head 05/02/2016 FINDINGS: Brain: 5 mm enhancing lesion in the right cerebellum with central necrosis as noted on CT. Mild surrounding edema. This is consistent with metastatic disease. No other enhancing brain lesions identified. Image quality degraded by motion as well as artifact from hair braids. Mild atrophy. Chronic microvascular ischemic changes in the white matter and basal ganglia. No acute infarct. Negative for hemorrhage. Pituitary normal in size. Vascular: Normal arterial flow voids. Skull and upper cervical spine: Abnormal hypo intensity in the C2 and C3 vertebral bodies worrisome  for bony metastatic disease. No lesions in the calvarium identified. Sinuses/Orbits: Complete opacification  of the right maxillary sinus. Chronic fracture of the right medial orbit. No orbital mass lesion. Other: None IMPRESSION: 5 mm enhancing metastatic deposit right cerebellum with mild surrounding edema. No other enhancing brain lesions Abnormality of the C2 and C3 vertebral bodies, concerning for metastatic disease to bone. Recommend cervical spine MRI without with contrast for further evaluation. Electronically Signed   By: Franchot Gallo M.D.   On: 05/02/2016 08:20   Ct Abdomen Pelvis W Contrast  Result Date: 05/09/2016 CLINICAL DATA:  Altered mental status. Recent chest CT she was possible metastatic lesion. EXAM: CT ABDOMEN AND PELVIS WITH CONTRAST TECHNIQUE: Multidetector CT imaging of the abdomen and pelvis was performed using the standard protocol following bolus administration of intravenous contrast. CONTRAST:  161m ISOVUE-300 IOPAMIDOL (ISOVUE-300) INJECTION 61% COMPARISON:  Chest CT 05/02/2016 and abdominopelvic CT 09/21/2012 FINDINGS: Lower chest: Irregular 2.7 cm nodular opacity over the lingula unchanged from recent chest CT. Linear density over the posterior right base likely scarring. Subtle increased interstitial markings in the lung bases. Three vessel calcified atherosclerotic coronary artery disease. Mild calcification in the mitral valve annulus. Calcified plaque over the descending thoracic aorta. Hepatobiliary: Within normal. Pancreas: Within normal. Spleen: Within normal. Adrenals/Urinary Tract: Left adrenal gland is difficult to visualize but likely within normal. Right adrenal gland is normal. Kidneys normal in size without hydronephrosis or nephrolithiasis. There are a few subcentimeter renal cortical hypodensities too small to characterize but likely cysts. Bladder is within normal. Ureters difficult to visualize. Stomach/Bowel: Stomach is within normal. Small bowel is  unremarkable. Appendix is not visualized. There is mild fecal retention throughout the colon which is otherwise unremarkable. Vascular/Lymphatic: Moderate calcified plaque over the abdominal aorta as well as at the takeoff of the celiac axis, superior mesenteric artery and renal arteries. Evidence of repair patient's abdominal aortic aneurysm with patent aortoiliac graft. Mild low-density material in the periaortic region likely chronic. 2 cm oval low-density focus anterior to the left renal artery likely chronic change although cannot exclude a necrotic lymph node. No significant adenopathy. Reproductive: Prostate gland not well visualized. Musculoskeletal: Degenerative change of the spine with disc disease at the L4-5 level. Mild degenerate change of the hips. Mild curvature of the lumbar spine convex left. IMPRESSION: Evidence of patient's known 2.7 cm irregular nodule over the lingula likely neoplastic. Minimal right basilar scarring. Mild interstitial change in the lung bases. No definite metastatic disease in the abdomen/pelvis. Evidence of previous aortic aneurysm repair with aortoiliac stent graft which is patent. Low-density material in the periaortic region likely chronic. Focal 2 cm low-density structure anterior to the left renal artery likely chronic and much less likely a necrotic lymph node. Recommend attention on follow-up. Few small subcentimeter renal cortical hypodensities too small to characterize but likely cysts. Three vessel atherosclerotic coronary artery disease. Aortic atherosclerosis. Electronically Signed   By: DMarin OlpM.D.   On: 05/09/2016 20:58    Time Spent in minutes  20   DLouellen MolderM.D on 05/12/2016 at 5:08 PM  Between 7am to 7pm - Pager - [phone] After 7pm go to [email or web address] - password [facility] -  Office  [phone]

## 2016-05-12 NOTE — Clinical Social Work Placement (Signed)
   CLINICAL SOCIAL WORK PLACEMENT  NOTE  Date:  05/12/2016  Patient Details  Name: Charles [name] MRN: [id number] Date of Birth: 06-09-1945  Clinical Social Work is seeking post-discharge placement for this patient at the [place] level of care (*CSW will initial, date and re-position this form in  chart as items are completed):  Yes   Patient/family provided with [facility] Work Department's list of facilities offering this level of care within the geographic area requested by the patient (or if unable, by the patient's family).  Yes   Patient/family informed of their freedom to choose among providers that offer the needed level of care, that participate in Medicare, Medicaid or managed care program needed by the patient, have an available bed and are willing to accept the patient.  Yes   Patient/family informed of [facility]'s ownership interest in [facility] and [facility], as well as of the fact that they are under no obligation to receive care at these facilities.  PASRR submitted to EDS on       PASRR number received on       Existing PASRR number confirmed on 05/12/16     FL2 transmitted to all facilities in geographic area requested by pt/family on 05/12/16     FL2 transmitted to all facilities within larger geographic area on       Patient informed that his/her managed care company has contracts with or will negotiate with certain facilities, including the following:            Patient/family informed of bed offers received.  Patient chooses bed at       Physician recommends and patient chooses bed at      Patient to be transferred to   on  .  Patient to be transferred to facility by       Patient family notified on   of transfer.  Name of family member notified:        PHYSICIAN       Additional Comment:    _______________________________________________ Salome Arnt, LCSW 05/12/2016, 3:38  PM [phone]

## 2016-05-12 NOTE — Clinical Social Work Note (Signed)
Clinical Social Work Assessment  Patient Details  Name: Charles [name] MRN: [id number] Date of Birth: April 12, 1946  Date of referral:  05/12/16               Reason for consult:  Discharge Planning                Permission sought to share information with:    Permission granted to share information::     Name::        Agency::     Relationship::     Contact Information:     Housing/Transportation Living arrangements for the past 2 months:  Single Family Home Source of Information:  Adult Children Patient Interpreter Needed:  None Criminal Activity/Legal Involvement Pertinent to Current Situation/Hospitalization:  No - Comment as needed Significant Relationships:  Adult Children Lives with:  Self Do you feel safe going back to the place where you live?  No Need for family participation in patient care:  Yes (Comment)  Care giving concerns:  Pt lives alone and is unable to return home at this time.    Social Worker assessment / plan:  CSW spoke with pt's daughter, Charles [name] on phone as pt is oriented to self and place only. CSW confirmed with her that there is no HCPOA. Pt came to ED in November and family reported he had been confused, aggressive, and combative. Pt initially recommended for geropsych. Several days later, pt was admitted due to brain metastases. Psych has since signed off as pt is psychiatrically clear and medications have been changed. For last several days, pt has been reportedly calm and cooperative. Discussed with pt's daughter that pt will be medically ready for d/c tomorrow and d/c plan needs to be addressed. She states she was under impression that pt could stay in hospital until biopsy results were received. She will make decision for hospice vs treatment at that time. CSW shared that this could be follow up outpatient and addressed. After much discussion, Charles [name] agrees to initiate bed search and mentioned [facility]. She is very concerned about follow up and that pt can  get to appropriate appointments from SNF. Discussed with admissions at [facility] who will review referral, but feel that pt will meet skilled criteria with new cancer diagnosis.   Employment status:  Retired Forensic scientist:  Medicare PT Recommendations:  Not assessed at this time Information / Referral to community resources:  [place]  Patient/Family's Response to care:  Pt's daughter is agreeable to SNF bed search and mentioned [facility] as an option.   Patient/Family's Understanding of and Emotional Response to Diagnosis, Current Treatment, and Prognosis:  Pt's daughter is knowledgeable of admission diagnosis. She is frustrated that pt is going to be ready for d/c prior to results of biopsy, but it was discussed with her that this could be follow up outpatient.   Emotional Assessment Appearance:  Appears stated age Attitude/Demeanor/Rapport:  Unable to Assess Affect (typically observed):  Unable to Assess Orientation:  Oriented to Self, Oriented to Place Alcohol / Substance use:  Alcohol Use Psych involvement (Current and /or in the community):  Yes (Comment) (pt is psychiatrically cleared)  Discharge Needs  Concerns to be addressed:  Discharge Planning Concerns Readmission within the last 30 days:  No Current discharge risk:  Lives alone Barriers to Discharge:  Continued Medical Work up   [employer], [employer], [place] 05/12/2016, 3:49 PM [phone]

## 2016-05-12 NOTE — NC FL2 (Signed)
[place] LEVEL OF CARE SCREENING TOOL     IDENTIFICATION  Patient Name: Charles [name] Birthdate: June 19, 1945 Sex: male Admission Date (Current Location): 04/25/2016  [place] and [place] Number:  [facility] [id number] [place] and Address:  [facility] [address] [place]      Provider Number: [id number]  Attending Physician Name and Address:  Louellen Molder, MD  Relative Name and Phone Number:       Current Level of Care: Hospital Recommended Level of Care: [place] Prior Approval Number:    Date Approved/Denied:   PASRR Number: [id number]  Discharge Plan: SNF    Current Diagnoses: Patient Active Problem List   Diagnosis Date Noted  . Goals of care, counseling/discussion   . Palliative care encounter   . Lesion of lung 05/02/2016  . Brain metastases ([place]) 05/02/2016  . Lung mass 05/02/2016  . Cavitating mass in right upper lung lobe   . Peripheral vascular disease, unspecified 11/11/2012  . Encephalopathy, metabolic 95/01/3266  . Acute post-hemorrhagic anemia 10/11/2012  . Delirium 10/04/2012  . Salmonella bacteremia 09/25/2012  . Acute respiratory failure ([place]) 09/22/2012  . Hyponatremia 09/21/2012  . Adult failure to thrive 09/21/2012  . Dehydration 09/21/2012  . Generalized weakness 09/21/2012  . AAA (abdominal aortic aneurysm, ruptured) ([place]) 09/21/2012  . New onset atrial fibrillation ([place]) 09/21/2012  . Alcohol abuse 09/21/2012    Orientation RESPIRATION BLADDER Height & Weight     Self, Place  Normal Incontinent Weight: 98 lb 1.7 oz (44.5 kg) Height:  '5\' 10"'$  (177.8 cm)  BEHAVIORAL SYMPTOMS/MOOD NEUROLOGICAL BOWEL NUTRITION STATUS  Other (Comment) (pt was initially aggressive and combative, but has been calm and cooperative for several days after medication changes)  (n/a) Continent Diet (Regular)  AMBULATORY STATUS COMMUNICATION OF NEEDS Skin   Limited Assist Verbally Normal                       Personal Care Assistance Level of Assistance  Bathing, Feeding, Dressing Bathing Assistance: Limited assistance Feeding assistance: Independent Dressing Assistance: Limited assistance     Functional Limitations Info  Sight, Hearing, Speech Sight Info: Adequate Hearing Info: Adequate Speech Info: Adequate    SPECIAL CARE FACTORS FREQUENCY                       Contractures Contractures Info: Not present    Additional Factors Info  Psychotropic Code Status Info: Full code Allergies Info: No known allergies Psychotropic Info: Seroquel, Haldol, Ativan         Current Medications (05/12/2016):  This is the current hospital active medication list Current Facility-Administered Medications  Medication Dose Route Frequency Provider Last Rate Last Dose  . 0.9 %  sodium chloride infusion  250 mL Intravenous PRN Phillips Grout, MD      . Derrill Memo ON 05/13/2016] 0.9 %  sodium chloride infusion   Intravenous Continuous Docia Barrier, PA      . dexamethasone (DECADRON) tablet 2 mg  2 mg Oral Q12H Sinda Du, MD   2 mg at 05/12/16 1409  . haloperidol lactate (HALDOL) injection 2 mg  2 mg Intravenous Q6H PRN Nishant Dhungel, MD   2 mg at 05/12/16 0605  . HYDROcodone-acetaminophen (NORCO/VICODIN) 5-325 MG per tablet 1 tablet  1 tablet Oral Q4H PRN Oswald Hillock, MD   1 tablet at 05/12/16 0855  . LORazepam (ATIVAN) injection 2 mg  2 mg Intravenous Q4H PRN  Oswald Hillock, MD   2 mg at 05/11/16 1329  . ondansetron (ZOFRAN) tablet 4 mg  4 mg Oral Q6H PRN Phillips Grout, MD       Or  . ondansetron (ZOFRAN) injection 4 mg  4 mg Intravenous Q6H PRN Phillips Grout, MD      . QUEtiapine (SEROQUEL) tablet 50 mg  50 mg Oral BID Nishant Dhungel, MD   50 mg at 05/12/16 0855  . sodium chloride flush (NS) 0.9 % injection 3 mL  3 mL Intravenous Q12H Phillips Grout, MD   3 mL at 05/12/16 0857  . sodium chloride flush (NS) 0.9 % injection 3 mL  3 mL Intravenous PRN Phillips Grout, MD         Discharge Medications: Please see discharge summary for a list of discharge medications.  Relevant Imaging Results:  Relevant Lab Results:   Additional Information SSN: [id number]. Pt may have follow up appointments based on results of biopsy.   Benay Pike Rondo, [place]

## 2016-05-12 NOTE — Progress Notes (Signed)
Patient has been scheduled for lung biopsy tomorrow (05/13/16) in IR as CT of the Abd/Pelvis was negative for secondary approach. Plan for procedure was discussed with patient's daughter given the patient's mental status.  Daughter is aware, all questions answered, and consent was obtained.   Spoke with patient's RN at [facility].  Patient to be NPO after midnight. He is not currently on any blood thinners.  RN to arrange ambulance transport for patient to [facility]- patient to arrive at 11am.  Orders in place.   Brynda Greathouse, MS RD PA-C 05/12/16  9:50 AM

## 2016-05-12 NOTE — Consult Note (Signed)
Consultation Note Date: 05/12/2016   Patient Name: Charles [name]  DOB: Sep 04, 1945  MRN: [id number]  Age / Sex: 70 y.o., male  PCP: No Pcp Per Patient Referring Physician: Louellen Molder, MD  Reason for Consultation: Establishing goals of care and Psychosocial/spiritual support  HPI/Patient Profile: 70 y.o. male  with past medical history of AAA, hypertension and high cholesterol, collapsed lung status post fall in rib fracture, femme pop bypass 09/2012 hernia repair with wound back abdominal closure 08/2012 admitted on 04/25/2016 with hyponatremia, alcohol abuse, lung lesion with brain metastasis.   Clinical Assessment and Goals of Care: Charles [name] is lying quietly in bed. He is unable to communicate fully, other than he is hungry. He has no complaints other than being hungry at this time. He does not seem to have a full understanding of his health problems. We talk about going to get a biopsy, he states as long as he gets bed he is okay.   Healthcare power of attorney NEXT OF KIN - daughter, Wille Aubuchon, who lives out of state.   SUMMARY OF RECOMMENDATIONS   per case manager, daughter states that under most circumstances she would be accepting of hospice care for her father. She is, however, searching for a definitive diagnosis.  Code Status/Advance Care Planning:  Full code  discussion needs to be having with family regarding code status. Charles [name] is unable to participate fully in these types of discussions at this time.  Symptom Management:   per hospitalist  Palliative Prophylaxis:   Aspiration and Frequent Pain Assessment  Additional Recommendations (Limitations, Scope, Preferences):  Continue to treat the treatable at this point, searching for definitive diagnosis via biopsy  Psycho-social/Spiritual:   Desire for further Chaplaincy support:no  Additional Recommendations:  Caregiving  Support/Resources and Education on Hospice  Prognosis:   < 3 months, would not be surprising based on frailty, metastatic cancer burden.   Discharge Planning: To Be Determined      Primary Diagnoses: Present on Admission: . Delirium . Lesion of lung . Brain metastases ([place]) . Alcohol abuse . Lung mass . Hyponatremia . AAA (abdominal aortic aneurysm, ruptured) ([place])   I have reviewed the medical record, interviewed the patient and family, and examined the patient. The following aspects are pertinent.  Past Medical History:  Diagnosis Date  . AAA (abdominal aortic aneurysm) ([place])   . Collapsed lung    s/p fall, rib fracture  . Hypercholesteremia   . Hypertension   . Salmonella bacteremia    Social History   Social History  . Marital status: Single    Spouse name: N/A  . Number of children: N/A  . Years of education: N/A   Social History Main Topics  . Smoking status: Current Every Day Smoker    Packs/day: 0.50    Types: Cigarettes  . Smokeless tobacco: Never Used  . Alcohol use Yes     Comment: Daily - 1/2 pt liquor; 2 40 oz beers -- per hx  . Drug use: No  . Sexual  activity: No   Other Topics Concern  . None   Social History Narrative  . None   Family History  Problem Relation Age of Onset  . Diabetes Mother   . Diabetes Father    Scheduled Meds: . dexamethasone  2 mg Oral Q12H  . QUEtiapine  50 mg Oral BID  . sodium chloride flush  3 mL Intravenous Q12H   Continuous Infusions: . [START ON 05/13/2016] sodium chloride     PRN Meds:.sodium chloride, haloperidol lactate, HYDROcodone-acetaminophen, LORazepam, ondansetron **OR** ondansetron (ZOFRAN) IV, sodium chloride flush Medications Prior to Admission:  Prior to Admission medications   Medication Sig Start Date End Date Taking? Authorizing Provider  acetaminophen (TYLENOL) 325 MG tablet Take 1-2 tablets (325-650 mg total) by mouth every 4 (four) hours as needed. Patient taking  differently: Take 325-650 mg by mouth every 4 (four) hours as needed for mild pain.  10/18/12   Bary Leriche, PA-C  ibuprofen (ADVIL,MOTRIN) 200 MG tablet Take 800 mg by mouth every 6 (six) hours as needed.    Historical Provider, MD  LORazepam (ATIVAN) 1 MG tablet Take 1 tablet (1 mg total) by mouth every 6 (six) hours as needed for anxiety (or shakiness). 54/56/25   Delora Fuel, MD   No Known Allergies Review of Systems  Unable to perform ROS: Mental status change    Physical Exam  Constitutional: No distress.  Weak and frail, thin  HENT:  Head: Normocephalic and atraumatic.  Temporal wasting  Cardiovascular: Normal rate and regular rhythm.   Pulmonary/Chest: Effort normal. No respiratory distress.  Abdominal: Soft. He exhibits no distension.  Musculoskeletal:  Severe muscle wasting  Neurological: He is alert.  Skin: Skin is warm and dry.  Nursing note and vitals reviewed.   Vital Signs: BP 131/78 (BP Location: Right Arm)   Pulse 71   Temp 98.6 F (37 C) (Oral)   Resp 18   Ht '5\' 10"'$  (1.778 m)   Wt 44.5 kg (98 lb 1.7 oz)   SpO2 100%   BMI 14.08 kg/m  Pain Assessment: 0-10   Pain Score: 10-Worst pain ever   SpO2: SpO2: 100 % O2 Device:SpO2: 100 % O2 Flow Rate: .O2 Flow Rate (L/min): 10 L/min  IO: Intake/output summary:  Intake/Output Summary (Last 24 hours) at 05/12/16 1441 Last data filed at 05/12/16 6389  Gross per 24 hour  Intake              123 ml  Output              450 ml  Net             -327 ml    LBM: Last BM Date: 05/08/16 Baseline Weight: Weight: 45.4 kg (100 lb) Most recent weight: Weight: 44.5 kg (98 lb 1.7 oz)     Palliative Assessment/Data:   Flowsheet Rows   Flowsheet Row Most Recent Value  Intake Tab  Referral Department  Hospitalist  Unit at Time of Referral  Med/Surg Unit  Palliative Care Primary Diagnosis  Cancer  Date Notified  05/08/16  Palliative Care Type  New Palliative care  Reason for referral  Clarify Goals of Care    Date of Admission  04/25/16  Date first seen by Palliative Care  05/09/16  # of days Palliative referral response time  1 Day(s)  # of days IP prior to Palliative referral  13  Clinical Assessment  Psychosocial & Spiritual Assessment  Palliative Care Outcomes  Time In: 1225 Time Out: 1255 Time Total: 30 minutes Greater than 50%  of this time was spent counseling and coordinating care related to the above assessment and plan.  Signed by: Drue Novel, NP   Please contact Palliative Medicine Team phone at [phone] for questions and concerns.  For individual provider: See Shea Evans

## 2016-05-13 ENCOUNTER — Encounter (HOSPITAL_COMMUNITY): Payer: Self-pay

## 2016-05-13 ENCOUNTER — Ambulatory Visit (HOSPITAL_COMMUNITY)
Admit: 2016-05-13 | Discharge: 2016-05-13 | Disposition: A | Discharge status: Home / Self Care | Payer: Medicare Other | Attending: Pulmonary Disease | Admitting: Pulmonary Disease

## 2016-05-13 LAB — CBC WITH DIFFERENTIAL/PLATELET
Basophils Absolute: 0 10*3/uL (ref 0.0–0.1)
Basophils Relative: 0 %
Eosinophils Absolute: 0 10*3/uL (ref 0.0–0.7)
Eosinophils Relative: 0 %
HEMATOCRIT: 33.7 % — AB (ref 39.0–52.0)
HEMOGLOBIN: 11.1 g/dL — AB (ref 13.0–17.0)
LYMPHS ABS: 1.2 10*3/uL (ref 0.7–4.0)
LYMPHS PCT: 15 %
MCH: 32.2 pg (ref 26.0–34.0)
MCHC: 32.9 g/dL (ref 30.0–36.0)
MCV: 97.7 fL (ref 78.0–100.0)
MONOS PCT: 7 %
Monocytes Absolute: 0.5 10*3/uL (ref 0.1–1.0)
NEUTROS ABS: 5.9 10*3/uL (ref 1.7–7.7)
NEUTROS PCT: 78 %
Platelets: 171 10*3/uL (ref 150–400)
RBC: 3.45 MIL/uL — AB (ref 4.22–5.81)
RDW: 13.1 % (ref 11.5–15.5)
WBC: 7.6 10*3/uL (ref 4.0–10.5)

## 2016-05-13 LAB — BASIC METABOLIC PANEL
Anion gap: 8 (ref 5–15)
BUN: 23 mg/dL — AB (ref 6–20)
CHLORIDE: 101 mmol/L (ref 101–111)
CO2: 24 mmol/L (ref 22–32)
CREATININE: 0.68 mg/dL (ref 0.61–1.24)
Calcium: 8.6 mg/dL — ABNORMAL LOW (ref 8.9–10.3)
GFR calc Af Amer: >60 mL/min (ref >60)
GFR calc non Af Amer: >60 mL/min (ref >60)
Glucose, Bld: 119 mg/dL — ABNORMAL HIGH (ref 65–99)
POTASSIUM: 4.4 mmol/L (ref 3.5–5.1)
SODIUM: 133 mmol/L — AB (ref 135–145)

## 2016-05-13 LAB — APTT: APTT: 28 s (ref 24–36)

## 2016-05-13 LAB — PROTIME-INR
INR: 0.99
Prothrombin Time: 13.1 seconds (ref 11.4–15.2)

## 2016-05-13 MED ORDER — DEXAMETHASONE 2 MG PO TABS: 2.0000 mg | ORAL_TABLET | Freq: Two times a day (BID) | ORAL | 0 refills | Status: AC

## 2016-05-13 MED ORDER — QUETIAPINE FUMARATE 50 MG PO TABS: 50.0000 mg | ORAL_TABLET | Freq: Two times a day (BID) | ORAL | 0 refills | Status: AC

## 2016-05-13 NOTE — Progress Notes (Signed)
He is scheduled for lung biopsy today.

## 2016-05-13 NOTE — Progress Notes (Signed)
Patient requested to have consult to determine capacity for medical decision making. Charles [name] is cooperative today during assessment. He is able to answer basic orientation questions correctly. Charles [name] did struggle to recall the year but after some thought was able to report correctly. Patient appears to understand his current medical condition. According to notes he has been diagnosed recently with a lung mass and was scheduled to have a biopsy today. The patient reports he refused today because "I did not want it done today in the first place. It's too cold for me. I know it's important and I plan on having it done. How else will I get the right treatment?" When asked about what the risks of refusing the treatment would be patient stated "I don't want to think about that. I am hoping to get better. I just need to have the biopsy done another day. I have some spots on my lung that need further work up." Charles [name] was seen in conjunction with Dr. Parke Poisson for the capacity assessment. At this time it was determined that patient has capacity to participate in the medical decision making process. Patient appears to have understanding of his current medical condition and is motivated to have the appropriate testing. His capacity to make medical decisions is subject to change based on his current condition as it does according to notes already involve metastasis to the brain. Again at this time patient is determined to have capacity to make medical decisions.    Agree with NP note as above  Neita Garnet, MD

## 2016-05-13 NOTE — Care Management Note (Signed)
Case Management Note  Patient Details  Name: Charles [name] MRN: [id number] Date of Birth: Feb 28, 1946  If discussed at [place] Length of Stay Meetings, dates discussed:  05/13/2016  Additional Comments: Pt discharging to facility. Needs to be 24/hr with out sitter.   Sherald Barge, RN 05/13/2016, 3:41 PM

## 2016-05-13 NOTE — Discharge Summary (Addendum)
Physician Discharge Summary  Charles [name] OIN:[id number] DOB: 09/17/1945 DOA: 04/25/2016  PCP: No PCP Per Patient  Admit date: 04/25/2016 Discharge date: 05/14/2016  Admitted From: home Disposition:  SNF  Recommendations for Outpatient Follow-up:  1. Follow up with MD at SNF. Recommend palliative care follow-up at the facility.   Equipment/Devices: None  Discharge Condition: Guarded CODE STATUS: Full code (daughter will discuss with her siblings and notify regarding CODE STATUS) Diet recommendation: Regular   Discharge Diagnoses:  Principal Problem:   Brain metastases ([place])   Active Problems:   Delirium   Hyponatremia   AAA (abdominal aortic aneurysm, ruptured) ([place])   Alcohol abuse   Lesion of lung   Lung mass   Palliative care encounter   Goals of care, counseling/discussion   Protein calorie malnutrition  Brief narrative/history of present illness 70 year old male with a history of? Dementia, alcohol abuse, hypertension, AAA brought to the ED by his daughter for delirium and hallucinations. Patient was in ED for almost a week awaiting placement. Patient had the symptoms almost 1 week prior to his hospitalization. Initially was thought due to alcohol withdrawal and started on CIWA however symptoms continued to persist. An incidental workup in the ED with CT chest showed concern for malignant lesion. A head CT with contrast was then done showing possible metastatic lesion. He was then admitted to hospitalist service for workup of possible malignancy and persistent altered mental status.  Delirium With hallucinations - Unclear if his underlying ?dementia worsening or the cerebellar mass with mild surrounding edema contributing to it. - He was also started on Decadron this hospitalization . - Psychiatry consulted, did not give any recommendations. - Started on Seroquel 50 mg twice a day on 12/8 with good response. He has not been agitated since then. Also has required  Haldol only occasionally and has not required Ativan. -Patient seems to be well oriented today and answering questions appropriately. -I have requested psychiatry to assess for his capacity prior to discharge.   Metastatic brain lesion -5 mm enhancing metastatic right cerebellar mass with surrounding edema. Necrotic/cavitary right upper lobe lung mass on chest CT. Possible invasion into mediastinum. Also shows a masslike consolidation in the lingula suspicious for malignancy. - Pulmonary consult appreciated. Underwent bronchoscopy --cytology was negative for malignancy. -CT abdomen and pelvis done without any mass lesion.  -Quantiferon  gold for TB was negative.  Patient sent to [facility] this morning for lung biopsy but refused to undergo biopsy stating he would do it at some other time. Biopsy was canceled and patient sent back. On discussing with patient he seems quite oriented and is not interested in having biopsy done for now.  I discussed in detail with his daughter in [place] about this. We have had frequent discussions on the phone about his care. She understands that this is likely malignancy unless proven otherwise. Given high suspicion for metastatic disease she agrees with plan on palliative care (this is what she wished for the patient but was keen on getting a biopsy for diagnosis). She agrees that biopsy may be done in future if patient changes his mind.  Patient has been accepted at [facility] and should be followed by palliative care.  I'm discharging him on tapering dose of Decadron over the next few days. Given his improvement in symptoms and plan for palliative care, role for long-term Decadron for his cerebellar edema is not clear. Patient will continue on Seroquel twice daily.   Alcohol abuse - No signs of  withdrawal.  History of AAA status post repair - Stable.  Severe protein calorie malnutrition encouraged po intake.        Family  Communication  : Discussed with daughter on the phone  Disposition Plan  : SNF with palliative care follow up    Consults  :   Pulmonary IR Palliative care  Procedures  :  CT chest MRI brain Bronchoscopy with biopsy   Discharge Instructions     Medication List    STOP taking these medications   ibuprofen 200 MG tablet Commonly known as:  ADVIL,MOTRIN   LORazepam 1 MG tablet Commonly known as:  ATIVAN     TAKE these medications   acetaminophen 325 MG tablet Commonly known as:  TYLENOL Take 1-2 tablets (325-650 mg total) by mouth every 4 (four) hours as needed. What changed:  reasons to take this   dexamethasone 2 MG tablet Commonly known as:  DECADRON Take 1 tablet (2 mg total) by mouth 2 (two) times daily. Take 2 mg 2 times a daily for 3 days, then, then 2 mg daily for next 3 days, then 2 mg every other day for 2 days ,then stop   QUEtiapine 50 MG tablet Commonly known as:  SEROQUEL Take 1 tablet (50 mg total) by mouth 2 (two) times daily.      Follow-up Information    MD at SNF Follow up.          No Known Allergies  Consultations:     Procedures/Studies: Dg Chest 2 View  Result Date: 05/01/2016 CLINICAL DATA:  Cough and congestion. EXAM: CHEST  2 VIEW COMPARISON:  04/23/2016 FINDINGS: COPD with hyperinflation and emphysematous changes. There is a right paramediastinal opacity that appears new from 2014. No internal air bronchograms are noted. No edema, effusion, or pneumothorax. Normal heart size.  Aortic atherosclerosis. IMPRESSION: 1. Medial right apex opacity which is new from 2014. Recommend chest CT to differentiate mass from pneumonia. 2. COPD. Electronically Signed   By: Monte Fantasia M.D.   On: 05/01/2016 12:34   Dg Chest 2 View  Result Date: 04/23/2016 CLINICAL DATA:  Chest pain EXAM: CHEST  2 VIEW COMPARISON:  01/16/2016 FINDINGS: Cardiac shadow is within normal limits. Aortic calcifications are noted. The lungs are hyper aerated  bilaterally without focal infiltrate or sizable effusion. Bilateral nipple shadows are noted. 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 Electronically Signed   By: Inez Catalina M.D.   On: 04/23/2016 12:40   Ct Head Wo Contrast  Result Date: 04/26/2016 CLINICAL DATA:  70 y/o  M; altered mental status. EXAM: CT HEAD WITHOUT CONTRAST TECHNIQUE: Contiguous axial images were obtained from the base of the skull through the vertex without intravenous contrast. COMPARISON:  None. FINDINGS: Brain: No evidence for large acute territory infarct, focal mass effect, or intracranial hemorrhage. Foci of hypoattenuation in subcortical and periventricular white matter are compatible with moderate chronic microvascular ischemic changes. Lucency in the right cerebellar hemisphere and bilateral lentiform nuclei probably represent chronic lacunar infarcts. Mild brain parenchymal volume loss. Normal ventricle size. No extra-axial collection. Vascular: No hyperdense vessel. Extensive calcific atherosclerosis of V4 segments and carotid siphons. Skull: Normal. Negative for fracture or focal lesion. Sinuses/Orbits: Opacification of right maxillary sinus with chronic inflammatory changes of the walls. Partial opacification of right anterior ethmoid air cells. Otherwise the visualized paranasal sinuses and mastoid air cells are normally aerated. Chronic right lamina papyracea fracture. Other: None. IMPRESSION: 1. No evidence for large acute infarct, focal mass effect, or intracranial  hemorrhage. 2. Background of moderate chronic microvascular ischemic changes and mild parenchymal volume loss. 3. Right maxillary chronic sinusitis. Electronically Signed   By: Kristine Garbe M.D.   On: 04/26/2016 02:40   Ct Head W & Wo Contrast  Result Date: 05/02/2016 CLINICAL DATA:  Altered mental status. Recently diagnosed lung cancer. EXAM: CT HEAD WITHOUT AND WITH CONTRAST TECHNIQUE: Contiguous axial images were obtained from the base of  the skull through the vertex without and with intravenous contrast CONTRAST:  22m ISOVUE-300 IOPAMIDOL (ISOVUE-300) INJECTION 61% COMPARISON:  Head CT 04/26/2016 FINDINGS: Brain: There is a focal contrast-enhancing lesion within the right cerebellum that measures 5 mm. No other enhancing lesions are identified. There is periventricular hypoattenuation compatible with chronic microvascular disease. No mass effect or midline shift. No hydrocephalus or extra-axial collection. No acute hemorrhage. Vascular: Atherosclerotic arterial calcifications of the skullbase. Skull: No skull fracture or focal calvarial lesion. Visualized skull base is normal. Sinuses/Orbits: There is complete opacification of the right maxillary sinus. Chronic right lamina papyracea fracture. Otherwise normal orbits. IMPRESSION: 1. Single enhancing metastatic lesion within the right cerebellar hemisphere, measuring 5 mm. MRI is recommended to evaluate the complete extent of intracranial metastatic disease. 2. No mass effect, hemorrhage or evidence of acute infarct. Electronically Signed   By: KUlyses JarredM.D.   On: 05/02/2016 05:01   Ct Chest W Contrast  Result Date: 05/02/2016 CLINICAL DATA:  Right upper lobe masses EXAM: CT CHEST WITH CONTRAST TECHNIQUE: Multidetector CT imaging of the chest was performed during intravenous contrast administration. CONTRAST:  785mISOVUE-300 IOPAMIDOL (ISOVUE-300) INJECTION 61% COMPARISON:  Chest radiograph 05/01/2016 FINDINGS: Cardiovascular: There is atherosclerotic calcification of the aortic arch and the proximal arch vessels. There is extensive coronary artery calcification. Heart size is within normal limits. No pericardial effusion. Mediastinum/Nodes: No mediastinal, hilar or axillary lymphadenopathy. The visualized thyroid and thoracic esophageal course are unremarkable. Lungs/Pleura: There is a mass in the medial right lung apex with areas of cavitation and necrosis, measuring altogether 3.6 x 3.6  cm. This abuts the pleura in there is loss of the normal mediastinal fat surrounding the esophagus, suggesting a degree of invasion into the mediastinum. 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 There is a masslike area of consolidation within the anterior lingula that measures 2.4 x 1.9 cm. No pleural effusion. Additional smaller nodular opacity in the right upper lobe measures 10 x 7 mm. Upper Abdomen: No acute abnormality. Musculoskeletal: No chest wall abnormality. No acute or significant osseous findings. IMPRESSION: 1. Necrotic/cavitary right upper lobe mass with loss of adjacent mediastinal fat planes suggesting possible invasion into the mediastinum. The appearance is most suggestive of squamous cell carcinoma. A cavitary infectious process is considered less likely. 2. Suspected satellite nodule in the right upper lobe measuring up to 1 cm. 3. Masslike consolidation in the lingula is also suspicious for neoplasm. Follow-up imaging would be helpful to determine if this focus may be infectious or inflammatory. 4. Aortic and coronary artery atherosclerosis. Electronically Signed   By: KeUlyses Jarred.D.   On: 05/02/2016 02:26   Mr BrJeri CosoNWontrast  Result Date: 05/02/2016 CLINICAL DATA:  New diagnosis lung cancer.  Abnormal CT head. EXAM: MRI HEAD WITHOUT AND WITH CONTRAST TECHNIQUE: Multiplanar, multiecho pulse sequences of the brain and surrounding structures were obtained without and with intravenous contrast. CONTRAST:  59m53mULTIHANCE GADOBENATE DIMEGLUMINE 529 MG/ML IV SOLN COMPARISON:  CT head 05/02/2016 FINDINGS: Brain: 5 mm enhancing lesion in the right cerebellum  with central necrosis as noted on CT. Mild surrounding edema. This is consistent with metastatic disease. No other enhancing brain lesions identified. Image quality degraded by motion as well as artifact from hair braids. Mild atrophy. Chronic microvascular ischemic changes in the white matter and basal  ganglia. No acute infarct. Negative for hemorrhage. Pituitary normal in size. Vascular: Normal arterial flow voids. Skull and upper cervical spine: Abnormal hypo intensity in the C2 and C3 vertebral bodies worrisome for bony metastatic disease. No lesions in the calvarium identified. Sinuses/Orbits: Complete opacification of the right maxillary sinus. Chronic fracture of the right medial orbit. No orbital mass lesion. Other: None IMPRESSION: 5 mm enhancing metastatic deposit right cerebellum with mild surrounding edema. No other enhancing brain lesions Abnormality of the C2 and C3 vertebral bodies, concerning for metastatic disease to bone. Recommend cervical spine MRI without with contrast for further evaluation. Electronically Signed   By: Franchot Gallo M.D.   On: 05/02/2016 08:20   Ct Abdomen Pelvis W Contrast  Result Date: 05/09/2016 CLINICAL DATA:  Altered mental status. Recent chest CT she was possible metastatic lesion. EXAM: CT ABDOMEN AND PELVIS WITH CONTRAST TECHNIQUE: Multidetector CT imaging of the abdomen and pelvis was performed using the standard protocol following bolus administration of intravenous contrast. CONTRAST:  121m ISOVUE-300 IOPAMIDOL (ISOVUE-300) INJECTION 61% COMPARISON:  Chest CT 05/02/2016 and abdominopelvic CT 09/21/2012 FINDINGS: Lower chest: Irregular 2.7 cm nodular opacity over the lingula unchanged from recent chest CT. Linear density over the posterior right base likely scarring. Subtle increased interstitial markings in the lung bases. Three vessel calcified atherosclerotic coronary artery disease. Mild calcification in the mitral valve annulus. Calcified plaque over the descending thoracic aorta. Hepatobiliary: Within normal. Pancreas: Within normal. Spleen: Within normal. Adrenals/Urinary Tract: Left adrenal gland is difficult to visualize but likely within normal. Right adrenal gland is normal. Kidneys normal in size without hydronephrosis or nephrolithiasis. There are  a few subcentimeter renal cortical hypodensities too small to characterize but likely cysts. Bladder is within normal. Ureters difficult to visualize. Stomach/Bowel: Stomach is within normal. Small bowel is unremarkable. Appendix is not visualized. There is mild fecal retention throughout the colon which is otherwise unremarkable. Vascular/Lymphatic: Moderate calcified plaque over the abdominal aorta as well as at the takeoff of the celiac axis, superior mesenteric artery and renal arteries. Evidence of repair patient's abdominal aortic aneurysm with patent aortoiliac graft. Mild low-density material in the periaortic region likely chronic. 2 cm oval low-density focus anterior to the left renal artery likely chronic change although cannot exclude a necrotic lymph node. No significant adenopathy. Reproductive: Prostate gland not well visualized. Musculoskeletal: Degenerative change of the spine with disc disease at the L4-5 level. Mild degenerate change of the hips. Mild curvature of the lumbar spine convex left. IMPRESSION: Evidence of patient's known 2.7 cm irregular nodule over the lingula likely neoplastic. Minimal right basilar scarring. Mild interstitial change in the lung bases. No definite metastatic disease in the abdomen/pelvis. Evidence of previous aortic aneurysm repair with aortoiliac stent graft which is patent. Low-density material in the periaortic region likely chronic. Focal 2 cm low-density structure anterior to the left renal artery likely chronic and much less likely a necrotic lymph node. Recommend attention on follow-up. Few small subcentimeter renal cortical hypodensities too small to characterize but likely cysts. Three vessel atherosclerotic coronary artery disease. Aortic atherosclerosis. Electronically Signed   By: DMarin OlpM.D.   On: 05/09/2016 20:58       Subjective: No overnight issues. Pt cooperative.  Discharge Exam: Vitals:   05/12/16 2243 05/13/16 0655  BP: (!)  149/80 128/78  Pulse: 76 63  Resp: 16 18  Temp: 98.5 F (36.9 C) 97.6 F (36.4 C)   Vitals:   05/12/16 0600 05/12/16 1436 05/12/16 2243 05/13/16 0655  BP: (!) 112/59 131/78 (!) 149/80 128/78  Pulse: 72 71 76 63  Resp:  '18 16 18  '$ Temp:  98.6 F (37 C) 98.5 F (36.9 C) 97.6 F (36.4 C)  TempSrc:  Oral Oral Oral  SpO2: 100% 100% 100% 99%  Weight:      Height:        Gen: not in distress,Appears much oriented HEENT:  moist mucosa, supple neck, temporal wasting Chest: clear b/l, no added sounds CVS: N S1&S2, no murmurs,  GI: soft, NT, ND,  Musculoskeletal: warm, no edema CNS: Alert and oriented  The results of significant diagnostics from this hospitalization (including imaging, microbiology, ancillary and laboratory) are listed below for reference.     Microbiology: No results found for this or any previous visit (from the past 240 hour(s)).   Labs: BNP (last 3 results) No results for input(s): BNP in the last 8760 hours. Basic Metabolic Panel:  Recent Labs Lab 05/09/16 0604 05/12/16 0502 05/13/16 0503  NA 138 133* 133*  K 4.3 4.1 4.4  CL 104 101 101  CO2 '26 25 24  '$ GLUCOSE 124* 110* 119*  BUN 25* 27* 23*  CREATININE 0.85 0.72 0.68  CALCIUM 9.0 8.5* 8.6*  MG 2.1  --   --    Liver Function Tests: No results for input(s): AST, ALT, ALKPHOS, BILITOT, PROT, ALBUMIN in the last 168 hours. No results for input(s): LIPASE, AMYLASE in the last 168 hours. No results for input(s): AMMONIA in the last 168 hours. CBC:  Recent Labs Lab 05/09/16 0604 05/12/16 0502 05/13/16 0503  WBC 7.3 7.9 7.6  NEUTROABS  --   --  5.9  HGB 11.0* 11.2* 11.1*  HCT 33.5* 34.4* 33.7*  MCV 98.5 98.3 97.7  PLT 201 185 171   Cardiac Enzymes: No results for input(s): CKTOTAL, CKMB, CKMBINDEX, TROPONINI in the last 168 hours. BNP: Invalid input(s): POCBNP CBG: No results for input(s): GLUCAP in the last 168 hours. D-Dimer No results for input(s): DDIMER in the last 72 hours. Hgb  A1c No results for input(s): HGBA1C in the last 72 hours. Lipid Profile No results for input(s): CHOL, HDL, LDLCALC, TRIG, CHOLHDL, LDLDIRECT in the last 72 hours. Thyroid function studies No results for input(s): TSH, T4TOTAL, T3FREE, THYROIDAB in the last 72 hours.  Invalid input(s): FREET3 Anemia work up No results for input(s): VITAMINB12, FOLATE, FERRITIN, TIBC, IRON, RETICCTPCT in the last 72 hours. Urinalysis    Component Value Date/Time   COLORURINE YELLOW 04/23/2016 1817   APPEARANCEUR CLEAR 04/23/2016 1817   LABSPEC 1.020 04/23/2016 1817   PHURINE 5.5 04/23/2016 1817   GLUCOSEU NEGATIVE 04/23/2016 1817   HGBUR NEGATIVE 04/23/2016 1817   BILIRUBINUR SMALL (A) 04/23/2016 1817   KETONESUR 15 (A) 04/23/2016 1817   PROTEINUR NEGATIVE 04/23/2016 1817   UROBILINOGEN 0.2 10/11/2012 0020   NITRITE NEGATIVE 04/23/2016 1817   LEUKOCYTESUR NEGATIVE 04/23/2016 1817   Sepsis Labs Invalid input(s): PROCALCITONIN,  WBC,  LACTICIDVEN Microbiology No results found for this or any previous visit (from the past 240 hour(s)).   Time coordinating discharge: Over 30 minutes  SIGNED:   Louellen Molder, MD  [facility] 05/13/2016, 3:15 PM Pager   If 7PM-7AM, please contact night-coverage [email or web address] Password  [email or web address]

## 2016-05-13 NOTE — Clinical Social Work Note (Signed)
Pt has bed offer at [facility]. Discussed with facility and they are unable to take pt due to sitter last night. This has been discontinued. Pt went to [facility] for biopsy today and upon arrival, refused procedure. He has returned to [facility] and awaiting psych assessment for capacity. Will follow up after capacity determination with either pt or daughter for d/c planning decisions.  Benay Pike, [place]

## 2016-05-13 NOTE — Progress Notes (Signed)
Notified that pt refused to have biopsy at [facility]. Pt stated that "it is too cold" and he would have biopsy "when it is warmer". Notified Dr. Clementeen Graham and case management.

## 2016-05-13 NOTE — Progress Notes (Signed)
Pt reports that he does not want to have biopsy done today because he is hungry and wants to eat. Pt has had previous history of confusion during this admission . Assessed pt's orientation at this time. Pt able to tell this nurse his name, DOB, place, situation, current date, next holiday and current president. Pt appears to be alert and oriented. MD notified of pt's possible refusal of biopsy.

## 2016-05-13 NOTE — Progress Notes (Signed)
Patient ID: Charles [name], male   DOB: 1946/02/21, 70 y.o.   MRN: [id number]   Pt refused today Please re order when feel appropriate

## 2016-05-13 NOTE — H&P (Signed)
Chief Complaint: Patient was seen in consultation today for right lung mass biopsy at the request of Hawkins,Edward  Referring Physician(s): Sinda Du  Supervising Physician: Marybelle Killings  Patient Status: [facility]-IP  History of Present Illness: Charles [name] is a 70 y.o. male   Dementia; etoh abuse; HTN; Hx AAA Presented to [facility] ED with hallucinations and delirium 04/25/16  Work up does reveal Brain lesion and bony lesions Right upper lobe mass---and mediastinum invasion Bronchoscopy performed 12/5 BRONCHIAL WASHING,RUL (SPECIMEN 2 OF 2 COLLECTED 05/06/16): NO MALIGNANT CELLS IDENTIFIED. BRONCHIAL BRUSHING, RUL (SPECIMEN 1 OF 2 COLLECTED 05/06/16): NO MALIGNANT CELLS IDENTIFIED.  Request made for needle bx of RUL mass for tissue diagnosis   Past Medical History:  Diagnosis Date  . AAA (abdominal aortic aneurysm) ([place])   . Collapsed lung    s/p fall, rib fracture  . Hypercholesteremia   . Hypertension   . Salmonella bacteremia     Past Surgical History:  Procedure Laterality Date  . ABDOMINAL AORTIC ANEURYSM REPAIR N/A 09/21/2012   Procedure: ANEURYSM ABDOMINAL AORTIC REPAIR;  Surgeon: Elam Dutch, MD;  Location: [place];  Service: Vascular;  Laterality: N/A;  . BRONCHIAL BRUSHINGS  05/06/2016   Procedure: BRONCHIAL BRUSHINGS;  Surgeon: Sinda Du, MD;  [facility];  Service: Cardiopulmonary;;  . BRONCHIAL WASHINGS  05/06/2016   Procedure: BRONCHIAL WASHINGS;  Surgeon: Sinda Du, MD;  [facility];  Service: Cardiopulmonary;;  . EMBOLECTOMY Right 09/21/2012   Procedure: EMBOLECTOMY;  Surgeon: Elam Dutch, MD;  Location: [place];  Service: Vascular;  Laterality: Right;  Embolectomy of right leg.  Marland Kitchen FEMORAL-POPLITEAL BYPASS GRAFT Right 09/30/2012   Procedure: BYPASS GRAFT FEMORAL-POPLITEAL ARTERY;  Surgeon: Conrad Calcutta, MD;  Location: [place];  Service: Vascular;  Laterality: Right;  . FLEXIBLE BRONCHOSCOPY Bilateral 05/06/2016   Procedure: FLEXIBLE BRONCHOSCOPY WITH PROPOFOL;  Surgeon: Sinda Du, MD;  [facility];  Service: Cardiopulmonary;  Laterality: Bilateral;  . HERNIA REPAIR    . PATCH ANGIOPLASTY  09/21/2012   Procedure: PATCH ANGIOPLASTY;  Surgeon: Elam Dutch, MD;  Location: [facility] OR;  Service: Vascular;;  Hemashield Patch Angioplasty fo Right Common Femoral Artery  . PLEURAL SCARIFICATION     collapsed lung  . WOUND DEBRIDEMENT  09/24/2012   Procedure: Removal of Abdominal wound VAC, and Abdominal Closure;  Surgeon: Elam Dutch, MD;  Location: [place];  Service: Vascular;;    Allergies: Patient has no known allergies.  Medications: Prior to Admission medications   Medication Sig Start Date End Date Taking? Authorizing Provider  acetaminophen (TYLENOL) 325 MG tablet Take 1-2 tablets (325-650 mg total) by mouth every 4 (four) hours as needed. Patient taking differently: Take 325-650 mg by mouth every 4 (four) hours as needed for mild pain.  10/18/12   Bary Leriche, PA-C  ibuprofen (ADVIL,MOTRIN) 200 MG tablet Take 800 mg by mouth every 6 (six) hours as needed.    Historical Provider, MD  LORazepam (ATIVAN) 1 MG tablet Take 1 tablet (1 mg total) by mouth every 6 (six) hours as needed for anxiety (or shakiness). 14/78/29   Delora Fuel, MD     Family History  Problem Relation Age of Onset  . Diabetes Mother   . Diabetes Father     Social History   Social History  . Marital status: Single    Spouse name: N/A  . Number of children: N/A  . Years of education: N/A   Social History Main Topics  .  Smoking status: Current Every Day Smoker    Packs/day: 0.50    Types: Cigarettes  . Smokeless tobacco: Never Used  . Alcohol use Yes     Comment: Daily - 1/2 pt liquor; 2 40 oz beers -- per hx  . Drug use: No  . Sexual activity: No   Other Topics Concern  . None   Social History Narrative  . None    Review of Systems: A 12 point ROS discussed and pertinent positives are  indicated in the HPI above.  All other systems are negative.  Review of Systems  Constitutional: Positive for activity change, appetite change and unexpected weight change.  Respiratory: Positive for cough.   Neurological: Positive for weakness.  Psychiatric/Behavioral: Positive for confusion.    Vital Signs: There were no vitals taken for this visit.  Physical Exam  Cardiovascular: Normal rate and regular rhythm.   Pulmonary/Chest: Effort normal and breath sounds normal.  Abdominal: Bowel sounds are normal.  Musculoskeletal: Normal range of motion.  Neurological: He is alert.  Skin: Skin is warm and dry.  Psychiatric:  Consented with dtr via phone  Nursing note and vitals reviewed.   Mallampati Score:  MD Evaluation Airway: WNL Heart: WNL Abdomen: WNL Chest/ Lungs: WNL ASA  Classification: 3 Mallampati/Airway Score: One  Imaging: Dg Chest 2 View  Result Date: 05/01/2016 CLINICAL DATA:  Cough and congestion. EXAM: CHEST  2 VIEW COMPARISON:  04/23/2016 FINDINGS: COPD with hyperinflation and emphysematous changes. There is a right paramediastinal opacity that appears new from 2014. No internal air bronchograms are noted. No edema, effusion, or pneumothorax. Normal heart size.  Aortic atherosclerosis. IMPRESSION: 1. Medial right apex opacity which is new from 2014. Recommend chest CT to differentiate mass from pneumonia. 2. COPD. Electronically Signed   By: Monte Fantasia M.D.   On: 05/01/2016 12:34   Dg Chest 2 View  Result Date: 04/23/2016 CLINICAL DATA:  Chest pain EXAM: CHEST  2 VIEW COMPARISON:  01/16/2016 FINDINGS: Cardiac shadow is within normal limits. Aortic calcifications are noted. The lungs are hyper aerated bilaterally without focal infiltrate or sizable effusion. Bilateral nipple shadows are noted. 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 Electronically Signed   By: Inez Catalina M.D.   On: 04/23/2016 12:40   Ct Head Wo Contrast  Result Date:  04/26/2016 CLINICAL DATA:  70 y/o  M; altered mental status. EXAM: CT HEAD WITHOUT CONTRAST TECHNIQUE: Contiguous axial images were obtained from the base of the skull through the vertex without intravenous contrast. COMPARISON:  None. FINDINGS: Brain: No evidence for large acute territory infarct, focal mass effect, or intracranial hemorrhage. Foci of hypoattenuation in subcortical and periventricular white matter are compatible with moderate chronic microvascular ischemic changes. Lucency in the right cerebellar hemisphere and bilateral lentiform nuclei probably represent chronic lacunar infarcts. Mild brain parenchymal volume loss. Normal ventricle size. No extra-axial collection. Vascular: No hyperdense vessel. Extensive calcific atherosclerosis of V4 segments and carotid siphons. Skull: Normal. Negative for fracture or focal lesion. Sinuses/Orbits: Opacification of right maxillary sinus with chronic inflammatory changes of the walls. Partial opacification of right anterior ethmoid air cells. Otherwise the visualized paranasal sinuses and mastoid air cells are normally aerated. Chronic right lamina papyracea fracture. Other: None. IMPRESSION: 1. No evidence for large acute infarct, focal mass effect, or intracranial hemorrhage. 2. Background of moderate chronic microvascular ischemic changes and mild parenchymal volume loss. 3. Right maxillary chronic sinusitis. Electronically Signed   By: Kristine Garbe M.D.   On: 04/26/2016 02:40  Ct Head W & Wo Contrast  Result Date: 05/02/2016 CLINICAL DATA:  Altered mental status. Recently diagnosed lung cancer. EXAM: CT HEAD WITHOUT AND WITH CONTRAST TECHNIQUE: Contiguous axial images were obtained from the base of the skull through the vertex without and with intravenous contrast CONTRAST:  61m ISOVUE-300 IOPAMIDOL (ISOVUE-300) INJECTION 61% COMPARISON:  Head CT 04/26/2016 FINDINGS: Brain: There is a focal contrast-enhancing lesion within the right  cerebellum that measures 5 mm. No other enhancing lesions are identified. There is periventricular hypoattenuation compatible with chronic microvascular disease. No mass effect or midline shift. No hydrocephalus or extra-axial collection. No acute hemorrhage. Vascular: Atherosclerotic arterial calcifications of the skullbase. Skull: No skull fracture or focal calvarial lesion. Visualized skull base is normal. Sinuses/Orbits: There is complete opacification of the right maxillary sinus. Chronic right lamina papyracea fracture. Otherwise normal orbits. IMPRESSION: 1. Single enhancing metastatic lesion within the right cerebellar hemisphere, measuring 5 mm. MRI is recommended to evaluate the complete extent of intracranial metastatic disease. 2. No mass effect, hemorrhage or evidence of acute infarct. Electronically Signed   By: KUlyses JarredM.D.   On: 05/02/2016 05:01   Ct Chest W Contrast  Result Date: 05/02/2016 CLINICAL DATA:  Right upper lobe masses EXAM: CT CHEST WITH CONTRAST TECHNIQUE: Multidetector CT imaging of the chest was performed during intravenous contrast administration. CONTRAST:  743mISOVUE-300 IOPAMIDOL (ISOVUE-300) INJECTION 61% COMPARISON:  Chest radiograph 05/01/2016 FINDINGS: Cardiovascular: There is atherosclerotic calcification of the aortic arch and the proximal arch vessels. There is extensive coronary artery calcification. Heart size is within normal limits. No pericardial effusion. Mediastinum/Nodes: No mediastinal, hilar or axillary lymphadenopathy. The visualized thyroid and thoracic esophageal course are unremarkable. Lungs/Pleura: There is a mass in the medial right lung apex with areas of cavitation and necrosis, measuring altogether 3.6 x 3.6 cm. This abuts the pleura in there is loss of the normal mediastinal fat surrounding the esophagus, suggesting a degree of invasion into the mediastinum. 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 There is a  masslike area of consolidation within the anterior lingula that measures 2.4 x 1.9 cm. No pleural effusion. Additional smaller nodular opacity in the right upper lobe measures 10 x 7 mm. Upper Abdomen: No acute abnormality. Musculoskeletal: No chest wall abnormality. No acute or significant osseous findings. IMPRESSION: 1. Necrotic/cavitary right upper lobe mass with loss of adjacent mediastinal fat planes suggesting possible invasion into the mediastinum. The appearance is most suggestive of squamous cell carcinoma. A cavitary infectious process is considered less likely. 2. Suspected satellite nodule in the right upper lobe measuring up to 1 cm. 3. Masslike consolidation in the lingula is also suspicious for neoplasm. Follow-up imaging would be helpful to determine if this focus may be infectious or inflammatory. 4. Aortic and coronary artery atherosclerosis. Electronically Signed   By: KeUlyses Jarred.D.   On: 05/02/2016 02:26   Mr BrJeri CosoXBontrast  Result Date: 05/02/2016 CLINICAL DATA:  New diagnosis lung cancer.  Abnormal CT head. EXAM: MRI HEAD WITHOUT AND WITH CONTRAST TECHNIQUE: Multiplanar, multiecho pulse sequences of the brain and surrounding structures were obtained without and with intravenous contrast. CONTRAST:  71m58mULTIHANCE GADOBENATE DIMEGLUMINE 529 MG/ML IV SOLN COMPARISON:  CT head 05/02/2016 FINDINGS: Brain: 5 mm enhancing lesion in the right cerebellum with central necrosis as noted on CT. Mild surrounding edema. This is consistent with metastatic disease. No other enhancing brain lesions identified. Image quality degraded by motion as well as artifact from hair braids. Mild  atrophy. Chronic microvascular ischemic changes in the white matter and basal ganglia. No acute infarct. Negative for hemorrhage. Pituitary normal in size. Vascular: Normal arterial flow voids. Skull and upper cervical spine: Abnormal hypo intensity in the C2 and C3 vertebral bodies worrisome for bony metastatic  disease. No lesions in the calvarium identified. Sinuses/Orbits: Complete opacification of the right maxillary sinus. Chronic fracture of the right medial orbit. No orbital mass lesion. Other: None IMPRESSION: 5 mm enhancing metastatic deposit right cerebellum with mild surrounding edema. No other enhancing brain lesions Abnormality of the C2 and C3 vertebral bodies, concerning for metastatic disease to bone. Recommend cervical spine MRI without with contrast for further evaluation. Electronically Signed   By: Franchot Gallo M.D.   On: 05/02/2016 08:20   Ct Abdomen Pelvis W Contrast  Result Date: 05/09/2016 CLINICAL DATA:  Altered mental status. Recent chest CT she was possible metastatic lesion. EXAM: CT ABDOMEN AND PELVIS WITH CONTRAST TECHNIQUE: Multidetector CT imaging of the abdomen and pelvis was performed using the standard protocol following bolus administration of intravenous contrast. CONTRAST:  157m ISOVUE-300 IOPAMIDOL (ISOVUE-300) INJECTION 61% COMPARISON:  Chest CT 05/02/2016 and abdominopelvic CT 09/21/2012 FINDINGS: Lower chest: Irregular 2.7 cm nodular opacity over the lingula unchanged from recent chest CT. Linear density over the posterior right base likely scarring. Subtle increased interstitial markings in the lung bases. Three vessel calcified atherosclerotic coronary artery disease. Mild calcification in the mitral valve annulus. Calcified plaque over the descending thoracic aorta. Hepatobiliary: Within normal. Pancreas: Within normal. Spleen: Within normal. Adrenals/Urinary Tract: Left adrenal gland is difficult to visualize but likely within normal. Right adrenal gland is normal. Kidneys normal in size without hydronephrosis or nephrolithiasis. There are a few subcentimeter renal cortical hypodensities too small to characterize but likely cysts. Bladder is within normal. Ureters difficult to visualize. Stomach/Bowel: Stomach is within normal. Small bowel is unremarkable. Appendix is not  visualized. There is mild fecal retention throughout the colon which is otherwise unremarkable. Vascular/Lymphatic: Moderate calcified plaque over the abdominal aorta as well as at the takeoff of the celiac axis, superior mesenteric artery and renal arteries. Evidence of repair patient's abdominal aortic aneurysm with patent aortoiliac graft. Mild low-density material in the periaortic region likely chronic. 2 cm oval low-density focus anterior to the left renal artery likely chronic change although cannot exclude a necrotic lymph node. No significant adenopathy. Reproductive: Prostate gland not well visualized. Musculoskeletal: Degenerative change of the spine with disc disease at the L4-5 level. Mild degenerate change of the hips. Mild curvature of the lumbar spine convex left. IMPRESSION: Evidence of patient's known 2.7 cm irregular nodule over the lingula likely neoplastic. Minimal right basilar scarring. Mild interstitial change in the lung bases. No definite metastatic disease in the abdomen/pelvis. Evidence of previous aortic aneurysm repair with aortoiliac stent graft which is patent. Low-density material in the periaortic region likely chronic. Focal 2 cm low-density structure anterior to the left renal artery likely chronic and much less likely a necrotic lymph node. Recommend attention on follow-up. Few small subcentimeter renal cortical hypodensities too small to characterize but likely cysts. Three vessel atherosclerotic coronary artery disease. Aortic atherosclerosis. Electronically Signed   By: DMarin OlpM.D.   On: 05/09/2016 20:58    Labs:  CBC:  Recent Labs  05/02/16 0537 05/09/16 0604 05/12/16 0502 05/13/16 0503  WBC 6.2 7.3 7.9 7.6  HGB 10.4* 11.0* 11.2* 11.1*  HCT 31.8* 33.5* 34.4* 33.7*  PLT 159 201 185 171    COAGS:  Recent  Labs  05/09/16 0920 05/12/16 0502 05/13/16 0503  INR 1.09 1.04 0.99  APTT 28  --  28    BMP:  Recent Labs  05/02/16 0537 05/09/16 0604  05/12/16 0502 05/13/16 0503  NA 133* 138 133* 133*  K 3.6 4.3 4.1 4.4  CL 103 104 101 101  CO2 '24 26 25 24  '$ GLUCOSE 125* 124* 110* 119*  BUN 12 25* 27* 23*  CALCIUM 8.1* 9.0 8.5* 8.6*  CREATININE 0.79 0.85 0.72 0.68  GFRNONAA >60 >60 >60 >60  GFRAA >60 >60 >60 >60    LIVER FUNCTION TESTS:  Recent Labs  04/23/16 1847 04/25/16 1304 05/02/16 0537  BILITOT 0.7 0.9 0.2*  AST 37 30 28  ALT 19 16* 20  ALKPHOS 59 53 45  PROT 7.5 7.1 6.5  ALBUMIN 3.4* 3.2* 2.6*    TUMOR MARKERS: No results for input(s): AFPTM, CEA, CA199, CHROMGRNA in the last 8760 hours.  Assessment and Plan:  Known dementia AMS; confusion Work up reveals brain lesion and bony lesions + RUL mass Bronchoscopy neg for malignancy---MD feels inadequate sample Now scheduled for needle bx for tissue diagnosis Risks and Benefits discussed with the patient and Dtr including, but not limited to bleeding, hemoptysis, respiratory failure requiring intubation, infection, pneumothorax requiring chest tube placement, stroke from air embolism or even death. All of the patient's and daughters questions were answered, they agreeable to proceed. Consent signed and in chart.   Thank you for this interesting consult.  I greatly enjoyed meeting Charles [name] and look forward to participating in their care.  A copy of this report was sent to the requesting provider on this date.  Electronically Signed: Monia Sabal A 05/13/2016, 11:22 AM   I spent a total of 40 Minutes    in face to face in clinical consultation, greater than 50% of which was counseling/coordinating care for right lung mass biopsy

## 2016-05-13 NOTE — Care Management Important Message (Signed)
Important Message  Patient Details  Name: Charles [name] MRN: [id number] Date of Birth: [birth year]   Medicare Important Message Given:  Yes    Sherald Barge, RN 05/13/2016, 3:42 PM

## 2016-05-14 DIAGNOSIS — C7931 Secondary malignant neoplasm of brain: Secondary | ICD-10-CM

## 2016-05-14 NOTE — Progress Notes (Signed)
Pt became really confused at shift change. He kept getting out of his room and walking the halls. At one point he almost went out one of the exits. Pt was given Haldol at 1905 by day nurse with no effect. Pt was redirected to room several times by staff and myself. Security came up and walked him to his room. He slept from about 2030 to 0100. I did not attempt to wake him at 2200 to give him his meds. I offered meds at 0100 and he refused. I documented in [facility]. He is calm at the moment. Will consider PRN meds if pt starts acting up.

## 2016-05-14 NOTE — Clinical Social Work Placement (Addendum)
   CLINICAL SOCIAL WORK PLACEMENT  NOTE  Date:  05/14/2016  Patient Details  Name: Charles [name] MRN: [id number] Date of Birth: 1946-05-14  Clinical Social Work is seeking post-discharge placement for this patient at the [place] level of care (*CSW will initial, date and re-position this form in  chart as items are completed):  Yes   Patient/family provided with [place] Work Department's list of facilities offering this level of care within the geographic area requested by the patient (or if unable, by the patient's family).  Yes   Patient/family informed of their freedom to choose among providers that offer the needed level of care, that participate in Medicare, Medicaid or managed care program needed by the patient, have an available bed and are willing to accept the patient.  Yes   Patient/family informed of 's ownership interest in [facility] and [facility], as well as of the fact that they are under no obligation to receive care at these facilities.  PASRR submitted to EDS on       PASRR number received on       Existing PASRR number confirmed on 05/12/16     FL2 transmitted to all facilities in geographic area requested by pt/family on 05/12/16     FL2 transmitted to all facilities within larger geographic area on       Patient informed that his/her managed care company has contracts with or will negotiate with certain facilities, including the following:        Yes   Patient/family informed of bed offers received.  Patient chooses bed at [place] at [facility]     Physician recommends and patient chooses bed at      Patient to be transferred to [facility] at [place] on 05/14/16.  Patient to be transferred to facility by family     Patient family notified on 05/14/16 of transfer.  Name of family member notified:  Arithia-daughter     PHYSICIAN       Additional Comment:  Pt accepted at [facility]. 24 hours  without sitter. Aware that pt was walking in halls last night and will plan to have pt wear ankle alarm. Discussed with MD and daughter, Charles [name]. Daughter is agreeable and aware that pt refused biopsy yesterday and was determined to have capacity by psychiatry. No changes to d/c summary from yesterday per MD.   _______________________________________________ Salome Arnt, Luis Llorens Torres 05/14/2016, 3:35 PM [phone]

## 2016-05-14 NOTE — Progress Notes (Signed)
Events of yesterday noted. He refused the biopsy. He is felt to have capacity at least at the time he was evaluated by psychiatry again yesterday. However he became very agitated and confused last night. This morning he is still agitated and confused

## 2016-05-14 NOTE — Progress Notes (Signed)
Pt's IV catheter removed and intact. Pt's IV site clean dry and intact. Report was called and given to [facility] ([facility], [place]). All questions were answered and no further questions at this time. Pt in stable condition and in no acute distress at time of discharge. Pt's daughter arrived to transport patient and indicated that patient will not accompany her due to his agitation. Informed by Social Worker that [facility] transport can be contacted to transport patient. [facility] has indicated that patient's daughter will incur a payment of $45.00 for required transportation and daughter has agreed to said amount. Pt is escorted by nurse tech and will be transported to [facility] by [facility].

## 2016-05-14 NOTE — Progress Notes (Signed)
Patient briefly seen and examined. Discharge was held an extra day as SNF required him to be without a sitter for 24 hours. No changes to DC meds or summary.  Domingo Mend, MD [facility] Pager: [phone]

## 2016-06-26 ENCOUNTER — Other Ambulatory Visit (HOSPITAL_COMMUNITY): Payer: Self-pay | Admitting: Hospice and Palliative Medicine

## 2016-06-26 DIAGNOSIS — R059 Cough, unspecified: Secondary | ICD-10-CM

## 2016-06-26 DIAGNOSIS — R05 Cough: Secondary | ICD-10-CM

## 2016-06-26 DIAGNOSIS — R918 Other nonspecific abnormal finding of lung field: Secondary | ICD-10-CM

## 2016-07-01 ENCOUNTER — Ambulatory Visit (HOSPITAL_COMMUNITY)
Admission: RE | Admit: 2016-07-01 | Discharge: 2016-07-01 | Disposition: A | Discharge status: Home / Self Care | Payer: Medicare Other | Source: Ambulatory Visit | Attending: Hospice and Palliative Medicine | Admitting: Hospice and Palliative Medicine

## 2016-07-01 DIAGNOSIS — R059 Cough, unspecified: Secondary | ICD-10-CM

## 2016-07-01 DIAGNOSIS — R05 Cough: Secondary | ICD-10-CM | POA: Diagnosis present

## 2016-07-01 DIAGNOSIS — R918 Other nonspecific abnormal finding of lung field: Secondary | ICD-10-CM | POA: Insufficient documentation

## 2016-07-01 DIAGNOSIS — I700 Atherosclerosis of aorta: Secondary | ICD-10-CM | POA: Insufficient documentation

## 2016-08-31 DEATH — deceased

## 2017-02-14 IMAGING — CT CT CHEST W/O CM
2 of 3 series · 15 of 36 positions shown, 18 images · non-contrast
Comparison: CT 05/02/2016

CLINICAL DATA: Follow up abnormal ct; cough; pt has no other
complaints today;

EXAM:
CT CHEST WITHOUT CONTRAST
TECHNIQUE: Multidetector CT imaging of the chest was performed following the
standard protocol without IV contrast.

[Series 2: thorax · axial · 0.58mm/px · z∈[-108,+170]mm · 12 of 165 slices shown, 15 images]
[im 13/165  mediastinal]
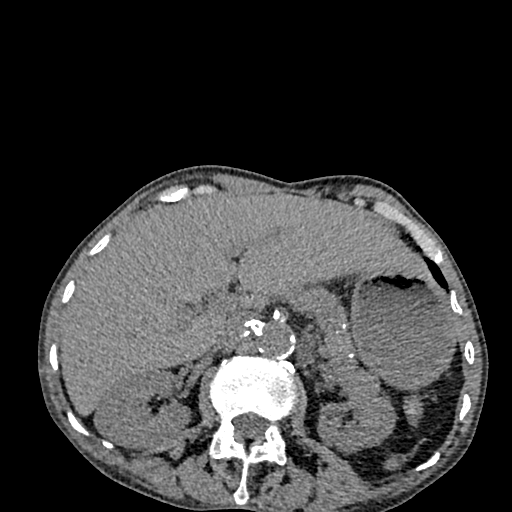
[im 13/165  lung]
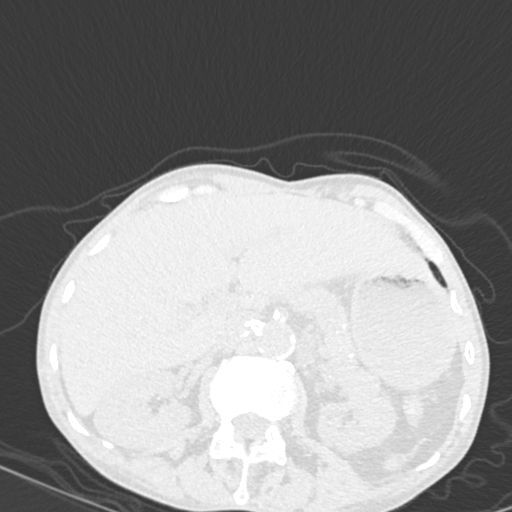
[im 25/165  lung]
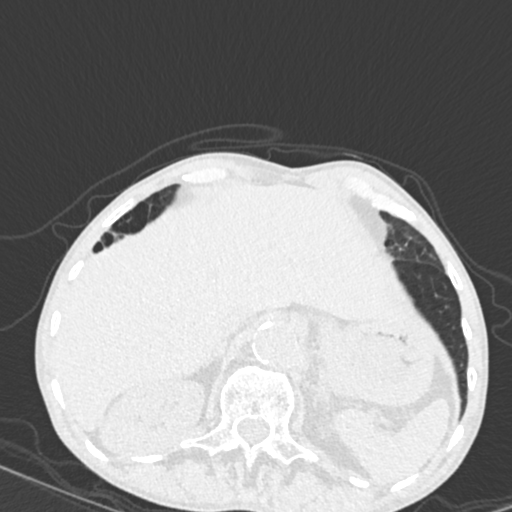
[im 37/165  lung]
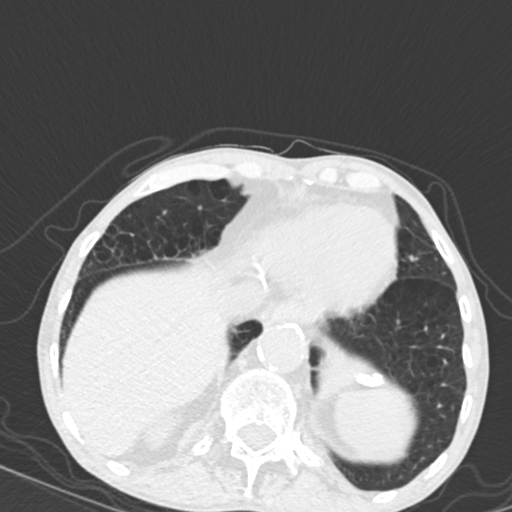
[im 49/165  lung]
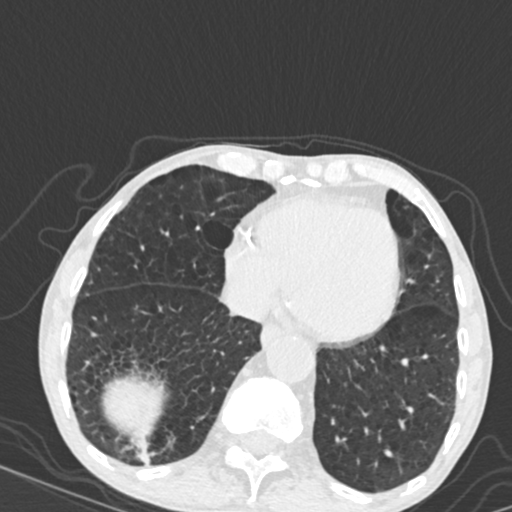
[im 61/165  mediastinal]
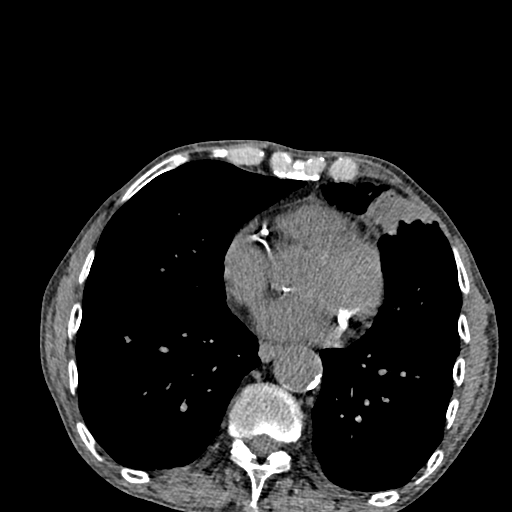
[im 61/165  lung]
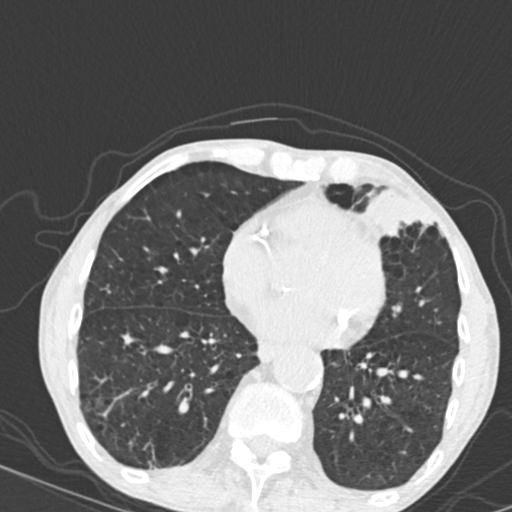
[im 73/165  lung]
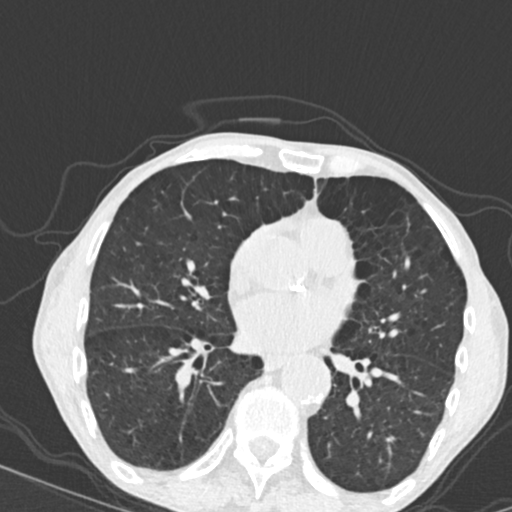
[im 92/165  lung]
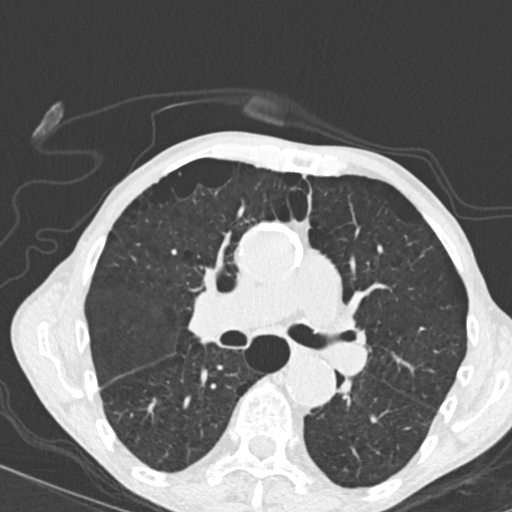
[im 104/165  lung]
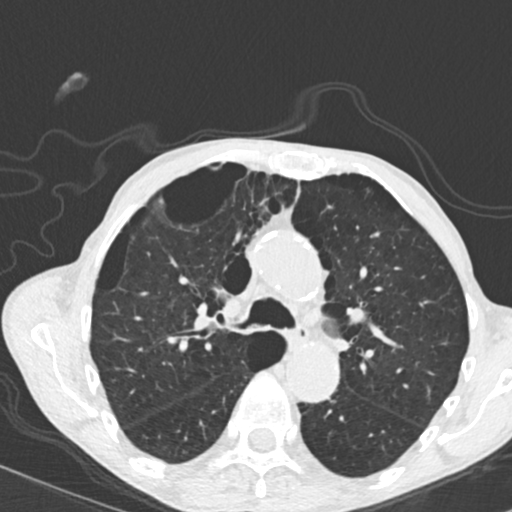
[im 116/165  mediastinal]
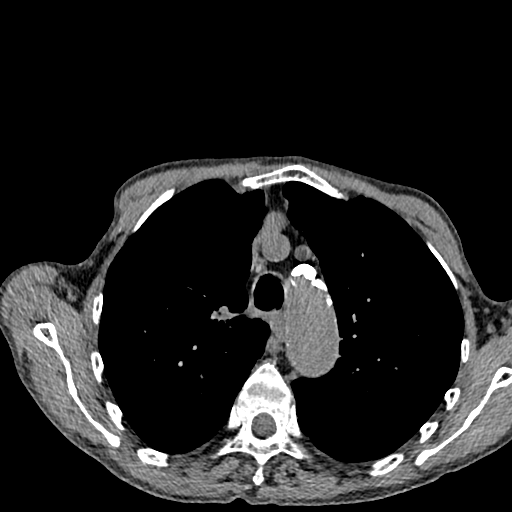
[im 116/165  lung]
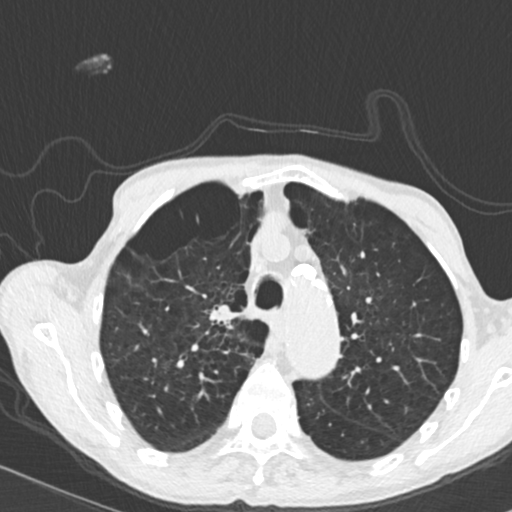
[im 128/165  lung]
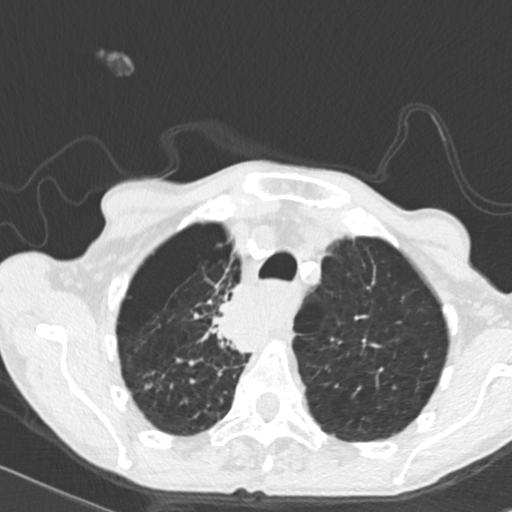
[im 140/165  lung]
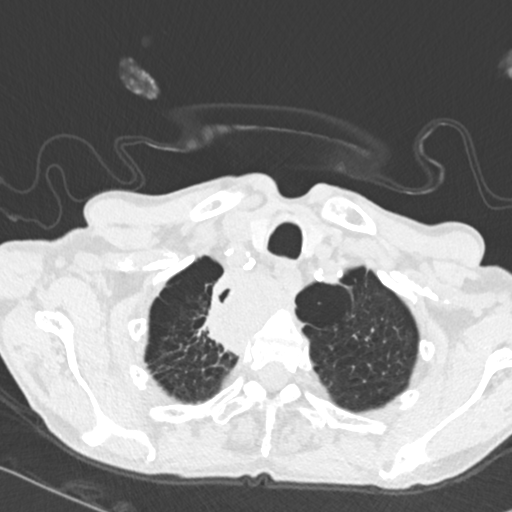
[im 152/165  lung]
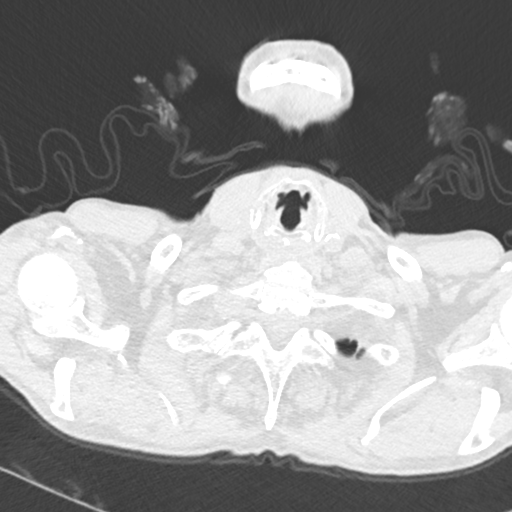

[Series 5: coronal · coronal · 0.55mm/px · 3 of 113 slices shown]
[im 23/113  lung]
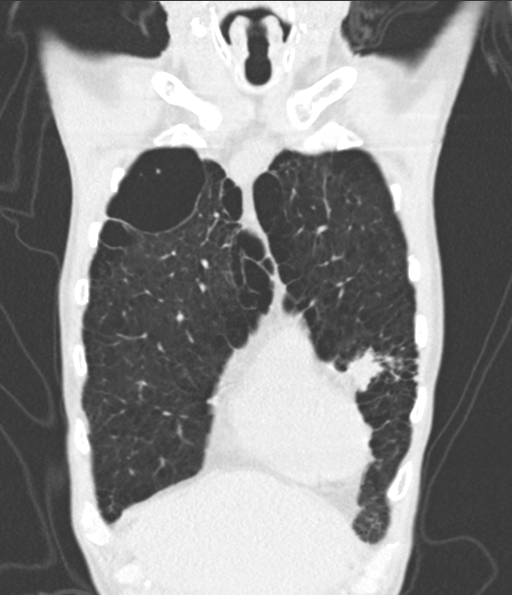
[im 45/113  lung]
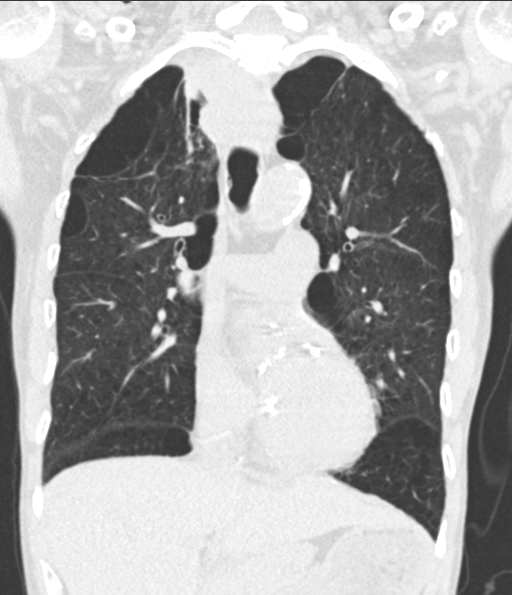
[im 68/113  lung]
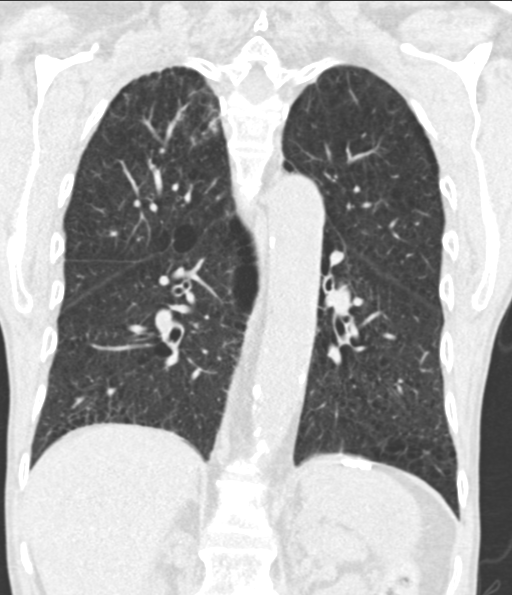

[15 of 36 positions shown; findings below may reference images not displayed]

FINDINGS: Cardiovascular: Coarse aortic and coronary calcifications. Heart
size normal. No pericardial effusion.

Mediastinum/Nodes: No adenopathy localized. Right upper lobe mass
which abuts and displaces in possibly invades the adjacent
mediastinum.

Lungs/Pleura: Advanced bullous emphysema. No pneumothorax. No
pleural effusion.

5.4 cm mass in the medial right lung apex abuts and displaces in
possibly invades the mediastinum, measures slightly larger than on
previous exam. 3.7 cm masslike consolidation in the lingula also
abutting the mediastinum without overt invasion, also appearing
slightly increased. 1 cm spiculated nodule in the posterior right
upper lobe stable.

Upper Abdomen: 2.3 cm low-attenuation lesion inferior to the left
adrenal possibly adenoma, incompletely visualized. No acute
findings.

Musculoskeletal: Degenerative changes in the lower cervical spine.
Sclerosis in the T1, T2, and T3 vertebral bodies adjacent to the
right upper lobe lesion suggesting contiguous extension of
involvement, without overt cortical destruction. Negative for
fracture.
IMPRESSION: 1. Enlarging medial right upper lobe mass with probable mediastinal
and thoracic vertebral body involvement consistent with neoplasm
versus aggressive atypical pneumonia. Follow-up recommended.
2. Enlarging 3.7 cm lingular masslike consolidation with similar
differential.
3. 1 cm spiculated right upper lobe nodule may represent synchronous
bronchogenic carcinoma ,metastasis, or scarring.
4. Coronary and aortic atherosclerosis
5. Possible left adrenal adenoma, incompletely visualized.
